# Patient Record
Sex: Female | Born: 1940 | Race: White | Hispanic: No | Smoking: Never smoker
Health system: Southern US, Community
[De-identification: ages and names within clinical notes are randomized; demographics above are authoritative.]

## PROBLEM LIST (undated history)

## (undated) DIAGNOSIS — E039 Hypothyroidism, unspecified: Secondary | ICD-10-CM

## (undated) DIAGNOSIS — Z794 Long term (current) use of insulin: Secondary | ICD-10-CM

## (undated) DIAGNOSIS — I639 Cerebral infarction, unspecified: Secondary | ICD-10-CM

## (undated) DIAGNOSIS — I10 Essential (primary) hypertension: Secondary | ICD-10-CM

## (undated) DIAGNOSIS — IMO0001 Reserved for inherently not codable concepts without codable children: Secondary | ICD-10-CM

## (undated) DIAGNOSIS — E669 Obesity, unspecified: Secondary | ICD-10-CM

## (undated) DIAGNOSIS — E785 Hyperlipidemia, unspecified: Secondary | ICD-10-CM

## (undated) DIAGNOSIS — E119 Type 2 diabetes mellitus without complications: Secondary | ICD-10-CM

## (undated) HISTORY — PX: APPENDECTOMY: SHX54

## (undated) HISTORY — PX: OTHER SURGICAL HISTORY: SHX169

## (undated) HISTORY — DX: Reserved for inherently not codable concepts without codable children: IMO0001

## (undated) HISTORY — DX: Cerebral infarction, unspecified: I63.9

## (undated) HISTORY — DX: Hypothyroidism, unspecified: E03.9

## (undated) HISTORY — DX: Type 2 diabetes mellitus without complications: E11.9

## (undated) HISTORY — PX: HAND SURGERY: SHX662

## (undated) HISTORY — DX: Hyperlipidemia, unspecified: E78.5

## (undated) HISTORY — PX: EYE SURGERY: SHX253

## (undated) HISTORY — PX: LUNG SURGERY: SHX703

## (undated) HISTORY — PX: WRIST SURGERY: SHX841

## (undated) HISTORY — DX: Long term (current) use of insulin: Z79.4

## (undated) HISTORY — PX: CHOLECYSTECTOMY: SHX55

## (undated) HISTORY — DX: Obesity, unspecified: E66.9

## (undated) HISTORY — DX: Essential (primary) hypertension: I10

---

## 2004-07-24 ENCOUNTER — Encounter: Admission: RE | Admit: 2004-07-24 | Discharge: 2004-07-24 | Payer: Self-pay | Admitting: Rheumatology

## 2004-11-30 ENCOUNTER — Inpatient Hospital Stay (HOSPITAL_COMMUNITY): Admission: RE | Admit: 2004-11-30 | Discharge: 2004-12-03 | Payer: Self-pay | Admitting: Orthopedic Surgery

## 2004-11-30 ENCOUNTER — Ambulatory Visit: Payer: Self-pay | Admitting: Physical Medicine & Rehabilitation

## 2004-12-03 ENCOUNTER — Inpatient Hospital Stay (HOSPITAL_COMMUNITY)
Admission: RE | Admit: 2004-12-03 | Discharge: 2004-12-10 | Payer: Self-pay | Admitting: Physical Medicine & Rehabilitation

## 2004-12-03 ENCOUNTER — Ambulatory Visit: Payer: Self-pay | Admitting: Physical Medicine & Rehabilitation

## 2007-11-14 ENCOUNTER — Emergency Department (HOSPITAL_COMMUNITY): Admission: EM | Admit: 2007-11-14 | Discharge: 2007-11-14 | Payer: Self-pay | Admitting: Emergency Medicine

## 2008-01-25 ENCOUNTER — Encounter: Payer: Self-pay | Admitting: Internal Medicine

## 2008-02-01 ENCOUNTER — Encounter: Payer: Self-pay | Admitting: Internal Medicine

## 2008-03-03 ENCOUNTER — Encounter: Payer: Self-pay | Admitting: Internal Medicine

## 2008-04-02 ENCOUNTER — Encounter: Payer: Self-pay | Admitting: Internal Medicine

## 2008-05-03 ENCOUNTER — Encounter: Payer: Self-pay | Admitting: Internal Medicine

## 2008-06-03 ENCOUNTER — Encounter: Payer: Self-pay | Admitting: Internal Medicine

## 2008-07-01 ENCOUNTER — Encounter: Payer: Self-pay | Admitting: Internal Medicine

## 2008-11-18 ENCOUNTER — Encounter: Payer: Self-pay | Admitting: Internal Medicine

## 2008-12-02 ENCOUNTER — Ambulatory Visit: Payer: Self-pay | Admitting: Internal Medicine

## 2008-12-17 ENCOUNTER — Ambulatory Visit: Payer: Self-pay | Admitting: Internal Medicine

## 2008-12-25 ENCOUNTER — Inpatient Hospital Stay (HOSPITAL_COMMUNITY): Admission: RE | Admit: 2008-12-25 | Discharge: 2008-12-30 | Payer: Self-pay | Admitting: Orthopedic Surgery

## 2009-06-26 ENCOUNTER — Ambulatory Visit: Payer: Self-pay | Admitting: Internal Medicine

## 2009-07-10 ENCOUNTER — Ambulatory Visit: Payer: Self-pay | Admitting: Internal Medicine

## 2009-07-24 ENCOUNTER — Ambulatory Visit: Payer: Self-pay | Admitting: Internal Medicine

## 2009-09-09 ENCOUNTER — Encounter: Admission: RE | Admit: 2009-09-09 | Discharge: 2009-09-09 | Payer: Self-pay | Admitting: Orthopedic Surgery

## 2010-03-05 ENCOUNTER — Ambulatory Visit: Payer: Self-pay | Admitting: Cardiology

## 2010-03-05 ENCOUNTER — Ambulatory Visit (HOSPITAL_COMMUNITY): Admission: RE | Admit: 2010-03-05 | Discharge: 2010-03-05 | Payer: Self-pay | Admitting: Cardiology

## 2010-03-12 ENCOUNTER — Telehealth: Payer: Self-pay | Admitting: Cardiology

## 2010-03-12 DIAGNOSIS — M79609 Pain in unspecified limb: Secondary | ICD-10-CM

## 2010-03-12 HISTORY — DX: Pain in unspecified limb: M79.609

## 2010-03-13 ENCOUNTER — Ambulatory Visit: Payer: Self-pay

## 2010-03-13 ENCOUNTER — Encounter: Payer: Self-pay | Admitting: Cardiology

## 2010-03-17 ENCOUNTER — Telehealth: Payer: Self-pay | Admitting: Cardiology

## 2010-04-01 ENCOUNTER — Encounter: Payer: Self-pay | Admitting: Cardiology

## 2010-04-01 ENCOUNTER — Ambulatory Visit: Payer: Self-pay | Admitting: Cardiology

## 2010-04-01 DIAGNOSIS — R9439 Abnormal result of other cardiovascular function study: Secondary | ICD-10-CM | POA: Insufficient documentation

## 2010-04-01 DIAGNOSIS — M069 Rheumatoid arthritis, unspecified: Secondary | ICD-10-CM | POA: Insufficient documentation

## 2010-04-01 DIAGNOSIS — I10 Essential (primary) hypertension: Secondary | ICD-10-CM

## 2010-04-01 HISTORY — DX: Essential (primary) hypertension: I10

## 2010-04-01 HISTORY — DX: Abnormal result of other cardiovascular function study: R94.39

## 2010-04-02 ENCOUNTER — Encounter: Payer: Self-pay | Admitting: Cardiology

## 2010-04-08 ENCOUNTER — Ambulatory Visit
Admission: RE | Admit: 2010-04-08 | Discharge: 2010-04-09 | Payer: Self-pay | Source: Home / Self Care | Attending: Orthopedic Surgery | Admitting: Orthopedic Surgery

## 2010-06-02 NOTE — Progress Notes (Signed)
Summary: letter for surgery  Phone Note Call from Patient Call back at Home Phone [phone]   Caller: Patient Reason for Call: Talk to Nurse Summary of Call: pt states she needs a letter for surgical clearance. Initial call taken by: Roe Coombs,  March 17, 2010 4:45 PM  Follow-up for Phone Call        Dr Riley Kill dictated clearance note and spoke with pt.  Follow-up by: Julieta Gutting, RN, BSN,  March 17, 2010 6:55 PM

## 2010-06-02 NOTE — Assessment & Plan Note (Signed)
Summary: eph/cardiac cath    Visit Type:  Post-hospital Primary Provider:  Dr. Lorin Picket   History of Present Illness: [name] is in for follow up.  We reviewed her cardiac studies today in detail.  She should be stable for surgery.  She asked me about thickening near her right face, and I suggested she look at this with her rheumatologist.  She has dry mouth and eyes  ?Sjogrens.  Her cardiac studies did not suggest that the abnormal nuc study was significant.  The LAD was mildly plaqued.  There is a modest RCA lesion, but not tight.  She needs foot surgery.    Problems Prior to Update: 1)  Rheumatoid Arthritis  (ICD-714.0) 2)  Hypertension, Benign  (ICD-401.1) 3)  Abnormal Cv (STRESS) Test  (ICD-794.39) 4)  Leg Pain, Right  (ICD-729.5)  Current Medications (verified): 1)  Metformin Hcl 1000 Mg Tabs (Metformin Hcl) .... Take 1 Tablet By Mouth Two Times A Day 2)  Naproxen 375 Mg Tabs (Naproxen) .... Take 1 Tablet By Mouth Two Times A Day 3)  Atacand 16 Mg Tabs (Candesartan Cilexetil) .... Take 1 Tablet By Mouth Once A Day 4)  Clonidine Hcl 0.2 Mg Tabs (Clonidine Hcl) .... Take Take 1/2 Tablet Dily 5)  Lansoprazole 30 Mg Tbdp (Lansoprazole) .... Take 1 Tablet By Mouth Once A Day 6)  Vytorin 10-20 Mg Tabs (Ezetimibe-Simvastatin) .... Take One Tablet By Mouth Daily At Bedtime 7)  Klor-Con 10 10 Meq Cr-Tabs (Potassium Chloride) .... 2 Tablets Daily When Taking Furosemide 8)  Furosemide 40 Mg Tabs (Furosemide) .... Take One Tablet By Mouth Daily. 9)  Levothyroxine Sodium 75 Mcg Tabs (Levothyroxine Sodium) .... Take 1 Tablet By Mouth Once A Day 10)  Metoprolol Tartrate 50 Mg Tabs (Metoprolol Tartrate) .... Take 2   Tablets Every Morning and 1 Tablet Every Evening. 11)  Metanx Tab 2mg  .... Take 1 Tablet By Mouth Once A Day 12)  Glimepiride 4 Mg Tabs (Glimepiride) .... Take 1 1/2 Tablet Daily 13)  Valium 5 Mg Tabs (Diazepam) .... Take 1 Tablet By Mouth Three Times A Day 14)  Oxycodone Hcl 5 Mg  Tabs (Oxycodone Hcl) .... Take 1 Tablet By Mouth Two Times A Day 15)  Oxycontin 40 Mg Xr12h-Tab (Oxycodone Hcl) .... One Tablet Every 8 Hours 16)  Prednisone 5 Mg Tabs (Prednisone) .... 2 Tablets Daily 17)  Humulin 70/30 Pen 70-30 % Susp (Insulin Isophane & Regular) .... 48 Units Am and 10 Units Pm  Allergies: 1)  ! Sulfa 2)  ! Adhesive Tape 3)  ! * Latex  Vital Signs:  Patient profile:   70 year old female Height:      59 inches Weight:      189 pounds BMI:     38.31 Pulse rate:   63 / minute Pulse rhythm:   irregular Resp:     18 per minute BP sitting:   110 / 70  (left arm) Cuff size:   large  Vitals Entered By: Vikki Ports (April 01, 2010 3:13 PM)  Physical Exam  General:  Well developed, well nourished, in no acute distress. Head:  normocephalic and atraumatic Eyes:  PERRLA/EOM intact; conjunctiva and lids normal. Neck:  Slightly anterior to R ear some thickening. Lungs:  Clear bilaterally to auscultation and percussion. Heart:  PMI non displaced.  Normal S1 and S2.  No definite murmur. Pulses:  pulses normal in all 4 extremities.  Groin stable. Extremities:  No clubbing or cyanosis.   EKG  Procedure date:  04/01/2010  Findings:      NSR.  First degree av block.Voltage for LVH.  No repole changes.    Cardiac Cath  Procedure date:  03/06/2010  Findings:      ANGIOGRAPHIC DATA: 1. Ventriculography done in the RAO projection reveals vigorous global     systolic function.  No segmental abnormalities or contraction     identified. 2. The left main is free of critical disease. 3. The left anterior descending artery courses to the apex.  There was     no significant calcification noted.  There was perhaps 10-20%     eccentric plaquing near the diagonal takeoff.  No other areas of     narrowing are noted, and the distal vessel was widely patent. 4. The circumflex is a fairly large-caliber vessel providing a smaller     first marginal, a very large second  marginal all of which are free     of critical disease. 5. The right coronary artery has about 50-60% narrowing in the     midportion.  It was on a steep bend.  The distal vessel consists of     a posterior descending and posterolateral branch both of which are     free of disease.   CONCLUSIONS: 1. Preserved left ventricular function. 2. No evidence of high-grade LAD disease in the area of previous     concern by radionuclide imaging. 3. 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  Impression & Recommendations:  Problem # 1:  ABNORMAL CV (STRESS) TEST (ICD-794.39) Ischemia not correlative at cath.  LAD without significant narrowing.  Modest RCA.  Suggest careful risk factor reduction.  Continued followup one year.   Problem # 2:  HYPERTENSION, BENIGN (ICD-401.1) controlled at present. Her updated medication list for this problem includes:    Atacand 16 Mg Tabs (Candesartan cilexetil) .Marland Kitchen... Take 1 tablet by mouth once a day    Clonidine Hcl 0.2 Mg Tabs (Clonidine hcl) .Marland Kitchen... Take take 1/2 tablet dily    Furosemide 40 Mg Tabs (Furosemide) .Marland Kitchen... Take one tablet by mouth daily.    Metoprolol Tartrate 50 Mg Tabs (Metoprolol tartrate) .Marland Kitchen... Take 2   tablets every morning and 1 tablet every evening.  Problem # 3:  LEG PAIN, RIGHT (ICD-729.5) Groin looks fine.   Patient Instructions: 1)  Your physician recommends that you schedule a follow-up appointment as needed.  2)  Your physician recommends that you continue on your current medications as directed. Please refer to the Current Medication list given to you today.

## 2010-06-02 NOTE — Progress Notes (Signed)
Summary: pt request call  Phone Note Call from Patient Call back at Home Phone [phone]   Caller: Patient Summary of Call: pt request call Initial call taken by: Judie Grieve,  March 12, 2010 2:11 PM  Follow-up for Phone Call        pt. had question regarding her Venous Duplex tomorrow. She is aware to come tomorrow morning for the ultrasound and to stay off of her leg as much as possible for the remainder of the day. No  other further problems. Whitney Maeola Sarah RN  March 12, 2010 2:46 PM  Follow-up by: Whitney Maeola Sarah RN,  March 12, 2010 2:46 PM

## 2010-06-02 NOTE — Progress Notes (Signed)
Summary: question on meds  Phone Note Call from Patient Call back at Home Phone [phone]   Caller: Patient Reason for Call: Talk to Nurse Summary of Call: pt has question on medication that dr. Riley Kill was going to give her. pt was at [facility] dr. Riley Kill did her cardiac cath. Initial call taken by: Roe Coombs,  March 12, 2010 12:30 PM  Follow-up for Phone Call        Pt does not have any questions about medicines. She does state that she had a lot of brusing after cath. Today she noticed a small knot. Groin has been painful since cath. Will discuss with DOD. Pt does not have transportation to office today to have any studies done. Dossie Arbour, RN, BSN  March 12, 2010 1:01 PM   Additional Follow-up for Phone Call Additional follow up Details #1::        Schedule for ultrasound.  OK to wait until 11/11 when the patient has transportation. Additional Follow-up by: Rollene Rotunda, MD, [facility],  March 12, 2010 1:18 PM    Additional Follow-up for Phone Call Additional follow up Details #2::    Pt. notified. She will come in for ultasound tomorrow at 9:30   Appended Document: Orders Update    Clinical Lists Changes  Problems: Added new problem of LEG PAIN, RIGHT (ICD-729.5) Orders: Added new Test order of Arterial Duplex Lower Extremity (Arterial Duplex Low) - Signed

## 2010-07-13 LAB — GLUCOSE, CAPILLARY
Glucose-Capillary: 210 mg/dL — ABNORMAL HIGH (ref 70–99)
Glucose-Capillary: 251 mg/dL — ABNORMAL HIGH (ref 70–99)
Glucose-Capillary: 277 mg/dL — ABNORMAL HIGH (ref 70–99)

## 2010-07-13 LAB — POCT I-STAT, CHEM 8
Chloride: 106 mEq/L (ref 96–112)
Glucose, Bld: 220 mg/dL — ABNORMAL HIGH (ref 70–99)
Potassium: 3.9 mEq/L (ref 3.5–5.1)
Sodium: 142 mEq/L (ref 135–145)
TCO2: 28 mmol/L (ref 0–100)

## 2010-07-14 LAB — GLUCOSE, CAPILLARY: Glucose-Capillary: 109 mg/dL — ABNORMAL HIGH (ref 70–99)

## 2010-07-14 LAB — BASIC METABOLIC PANEL
BUN: 20 mg/dL (ref 6–23)
CO2: 30 mEq/L (ref 19–32)
Calcium: 9.2 mg/dL (ref 8.4–10.5)
Chloride: 107 mEq/L (ref 96–112)
Creatinine, Ser: 0.76 mg/dL (ref 0.4–1.2)
Glucose, Bld: 109 mg/dL — ABNORMAL HIGH (ref 70–99)
Potassium: 3.8 mEq/L (ref 3.5–5.1)
Sodium: 143 mEq/L (ref 135–145)

## 2010-07-14 LAB — CBC
HCT: 35.2 % — ABNORMAL LOW (ref 36.0–46.0)
Hemoglobin: 11.5 g/dL — ABNORMAL LOW (ref 12.0–15.0)
MCHC: 32.7 g/dL (ref 30.0–36.0)
Platelets: 195 10*3/uL (ref 150–400)
RBC: 4.07 MIL/uL (ref 3.87–5.11)
WBC: 11.8 10*3/uL — ABNORMAL HIGH (ref 4.0–10.5)

## 2010-07-14 LAB — PROTIME-INR: INR: 0.94 (ref 0.00–1.49)

## 2010-08-08 LAB — CBC
HCT: 30.1 % — ABNORMAL LOW (ref 36.0–46.0)
HCT: 34.4 % — ABNORMAL LOW (ref 36.0–46.0)
Hemoglobin: 10.1 g/dL — ABNORMAL LOW (ref 12.0–15.0)
Hemoglobin: 8.9 g/dL — ABNORMAL LOW (ref 12.0–15.0)
MCHC: 33.4 g/dL (ref 30.0–36.0)
MCHC: 33.4 g/dL (ref 30.0–36.0)
MCHC: 33.5 g/dL (ref 30.0–36.0)
MCV: 92.8 fL (ref 78.0–100.0)
MCV: 93.6 fL (ref 78.0–100.0)
MCV: 94.4 fL (ref 78.0–100.0)
Platelets: 175 10*3/uL (ref 150–400)
Platelets: 200 10*3/uL (ref 150–400)
RBC: 2.84 MIL/uL — ABNORMAL LOW (ref 3.87–5.11)
RBC: 3.24 MIL/uL — ABNORMAL LOW (ref 3.87–5.11)
RBC: 3.64 MIL/uL — ABNORMAL LOW (ref 3.87–5.11)
RDW: 12.8 % (ref 11.5–15.5)
RDW: 12.9 % (ref 11.5–15.5)
WBC: 10.3 10*3/uL (ref 4.0–10.5)
WBC: 10.7 10*3/uL — ABNORMAL HIGH (ref 4.0–10.5)

## 2010-08-08 LAB — BASIC METABOLIC PANEL
BUN: 18 mg/dL (ref 6–23)
CO2: 25 mEq/L (ref 19–32)
CO2: 29 mEq/L (ref 19–32)
Calcium: 9.1 mg/dL (ref 8.4–10.5)
Chloride: 101 mEq/L (ref 96–112)
Chloride: 103 mEq/L (ref 96–112)
Creatinine, Ser: 0.73 mg/dL (ref 0.4–1.2)
GFR calc Af Amer: >60 mL/min (ref >60)
GFR calc Af Amer: >60 mL/min (ref >60)
GFR calc non Af Amer: >60 mL/min (ref >60)
Glucose, Bld: 269 mg/dL — ABNORMAL HIGH (ref 70–99)
Glucose, Bld: 271 mg/dL — ABNORMAL HIGH (ref 70–99)
Potassium: 4.2 mEq/L (ref 3.5–5.1)
Sodium: 138 mEq/L (ref 135–145)

## 2010-08-08 LAB — GLUCOSE, CAPILLARY
Glucose-Capillary: 128 mg/dL — ABNORMAL HIGH (ref 70–99)
Glucose-Capillary: 189 mg/dL — ABNORMAL HIGH (ref 70–99)
Glucose-Capillary: 206 mg/dL — ABNORMAL HIGH (ref 70–99)
Glucose-Capillary: 218 mg/dL — ABNORMAL HIGH (ref 70–99)
Glucose-Capillary: 231 mg/dL — ABNORMAL HIGH (ref 70–99)
Glucose-Capillary: 270 mg/dL — ABNORMAL HIGH (ref 70–99)
Glucose-Capillary: 326 mg/dL — ABNORMAL HIGH (ref 70–99)
Glucose-Capillary: 341 mg/dL — ABNORMAL HIGH (ref 70–99)

## 2010-08-08 LAB — PROTIME-INR
INR: 1 (ref 0.00–1.49)
INR: 1.8 — ABNORMAL HIGH (ref 0.00–1.49)
INR: 2 — ABNORMAL HIGH (ref 0.00–1.49)
INR: 2.2 — ABNORMAL HIGH (ref 0.00–1.49)
Prothrombin Time: 12.9 seconds (ref 11.6–15.2)
Prothrombin Time: 14.3 seconds (ref 11.6–15.2)
Prothrombin Time: 20.4 seconds — ABNORMAL HIGH (ref 11.6–15.2)
Prothrombin Time: 20.8 seconds — ABNORMAL HIGH (ref 11.6–15.2)
Prothrombin Time: 22.9 seconds — ABNORMAL HIGH (ref 11.6–15.2)
Prothrombin Time: 23.9 seconds — ABNORMAL HIGH (ref 11.6–15.2)

## 2010-08-08 LAB — URINALYSIS, ROUTINE W REFLEX MICROSCOPIC
Bilirubin Urine: NEGATIVE
Glucose, UA: 250 mg/dL — AB
Hgb urine dipstick: NEGATIVE
Specific Gravity, Urine: 1.015 (ref 1.005–1.030)

## 2010-08-08 LAB — TYPE AND SCREEN: Antibody Screen: NEGATIVE

## 2010-08-08 LAB — COMPREHENSIVE METABOLIC PANEL
ALT: 24 U/L (ref 0–35)
Albumin: 3.3 g/dL — ABNORMAL LOW (ref 3.5–5.2)
Alkaline Phosphatase: 88 U/L (ref 39–117)
CO2: 28 mEq/L (ref 19–32)
GFR calc Af Amer: >60 mL/min (ref >60)
Sodium: 138 mEq/L (ref 135–145)
Total Bilirubin: 0.4 mg/dL (ref 0.3–1.2)

## 2010-09-15 NOTE — H&P (Signed)
NAMERAE, PLOTNER             ACCOUNT NO.:  [id number]   MEDICAL RECORD NO.:  [id number]          PATIENT TYPE:  INP   LOCATION:  NA                           FACILITY:  [facility]   PHYSICIAN:  Ollen Gross, M.D.    DATE OF BIRTH:  06-05-1940   DATE OF ADMISSION:  12/25/2008  DATE OF DISCHARGE:                              HISTORY & PHYSICAL   CHIEF COMPLAINT:  Rheumatoid arthritis with end-stage arthritis in her  right knee.   HISTORY OF PRESENT ILLNESS:  Ms. Ivins is a 70 year old female who has  been dealing with rheumatoid arthritis for quite some time.  She had her  left knee replaced about 4 years ago and states that her right knee has  continued to worsen.  Her pain is so severe that she is unable to walk.  At times, she is unable to do things she would like to do at this point.  The patient would like to go ahead and get the knee replaced if  possible.  X-rays of the right knee reveal end-stage arthritis,  tricompartmental in nature in the right knee.   ALLERGIES:  SULFA (CAUSES NAUSEA, VOMITING), TAPE AND LATEX.   CURRENT MEDICATIONS:  1. Naproxen 375 mg 1 tablet p.o. b.i.d.  2. Actos/Metformin 15/500 mg 1 tablet p.o. daily.  3. Atacand 16 mg 1 tablet p.o. daily.  4. Fexofenadine 180 mg 1 tablet daily.  5. Clonidine 0.2 mg 1/2 tablet p.o. daily.  6. Prevacid 30 mg 1 tablet daily.  7. Vytorin 10/20 mg 1 tablet p.o. in the evening.  8. Hydrochlorot 25 mg 1 tablet p.o. daily  9. Metoprolol 50 mg 2 tablets p.o. in the morning and 1 tablet p.o. in      the evening.  10.Valium 5 mg 1 tablet p.o. t.i.d.  11.Oxycodone 5/325 mg 1 tablet p.o. b.i.d.  12.OxyContin 40 mg 1 tablet p.o. q.8 h.  13.Metanx 2 tablets p.o. daily.  14.Prednisone 5 mg 2 tablets p.o. daily.  15.Humalog mixture 75/25 quick pen insulin per sliding scale.   CURRENT MEDICAL CONDITIONS:  1. Rheumatoid arthritis.  2. End-stage arthritis of the right knee.  3. Hypertension.  4. Hiatal hernia.  5.  Gastroesophageal reflux disease.  6. Hemorrhoids.  7. Phlebitis  8. Urinary incontinence.  9. Urinary tract infections.  10.Cystitis.  11.Diabetes controlled by insulin.  12.Cellulitis.   REVIEW OF SYSTEMS:  GENERAL:  Positive for fatigue.  Negative for night  sweats or weight change.  HEENT/NEUROLOGICAL:  Positive for balance  problems.  Positive for dentures on top and bottom.  Negative for  blurred vision.  RESPIRATORY:  Negative for shortness of breath at rest  or with exertion.  Negative for cough.  Negative for wheezing.  CARDIOVASCULAR:  Negative for chest pain, palpitations.  GASTROINTESTINAL:  Negative for nausea, vomiting, diarrhea, negative for  blood in the stool.  GENITOURINARY:  Positive for urinary frequency.  Positive for frequent urinary tract infections and positive for urinary  incontinence.  DERM:  Positive for small recurring diabetic ulcer on the  plantar surface of the patient's right great toe.  MUSCULOSKELETAL:  Joint pain, joint swelling and back pain.   PAST SURGICAL HISTORY:  1. Left total knee arthroplasty.  2. Fusion of both great toes and thumbs.  3. Appendectomy.  4. Cholecystectomy.  5. 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   FAMILY HISTORY:  Father passed at age 85 of a heart attack.  Mother  passed at age 55 of Hodgkin's disease.  She also has hypertension.   SOCIAL HISTORY:  The patient is married.  She has never smoked.  The  patient denies use of alcohol or drugs.  The patient lives at home with  her husband and plans to go home after the surgery rather than going to  a rehabilitation center.   PHYSICAL EXAMINATION:  VITAL SIGNS:  Pulse 60, respirations 16, blood  pressure 107/75.  GENERAL:  Alert and oriented x79, a 70 year old white female well  developed, well nourished no acute distress.  Good historian.  HEENT:  Unremarkable.  NECK:  Supple, full range of motion.  Negative for lymphadenopathy.  CHEST:  Lungs  are clear to auscultation bilaterally without wheezes.  HEART:  Regular rate and rhythm without murmur.  ABDOMEN:  Soft, nontender, nondistended.  No guarding with palpation.  Bowel sounds are present in all four quadrants.  EXTREMITIES:  Right  knee negative for effusion, marked crepitus is palpated on range of  motion.  Her range of between 10 and 95 degrees.  There is no  instability noted, right great toe plantar surfaces has diabetic ulcer.  The patient states that this has recently been closed for a week, but  she feels that it may start to open.  Note, the patient has been going  to the wound center in [place].  NEUROLOGIC:  The patient has  decreased sensation in her feet bilaterally.  PERIPHERAL VASCULAR:  Carotid pulses 2+ without bruit.  Dorsalis pedis  pulses 2+ bilaterally.  Radial pulses 2+ bilaterally.   IMPRESSION:  Rheumatoid arthritis with end-stage arthritis of the right  knee.   PLAN:  Right total knee arthroplasty to be performed by Dr. Lequita Halt at  [facility] on December 25, 2008.  I have discussed with the  patient, that I would like for her to please send [place] a note or some type  of correspondence from her wound care physician in [place] regarding  their recommendations or thoughts for diabetic ulcer.      Rozell Searing, [facility]      Ollen Gross, M.D.  Electronically Signed    LD/MEDQ  D:  12/25/2008  T:  12/25/2008  Job:  [id number]

## 2010-09-15 NOTE — Op Note (Signed)
[name], [name]             ACCOUNT NO.:  [id number]   MEDICAL RECORD NO.:  [id number]          PATIENT TYPE:  INP   LOCATION:  0006                         FACILITY:  [facility]   PHYSICIAN:  Ollen Gross, M.D.    DATE OF BIRTH:  12/21/1940   DATE OF PROCEDURE:  12/25/2008  DATE OF DISCHARGE:                               OPERATIVE REPORT   PREOPERATIVE DIAGNOSIS:  Rheumatoid arthritis, right knee.   POSTOPERATIVE DIAGNOSIS:  Rheumatoid arthritis, right knee.   PROCEDURE:  Right total knee arthroplasty.   SURGEON:  Aluisio.   ASSISTANT:  Avel Peace PA-C.   ANESTHESIA:  Spinal.   ESTIMATED BLOOD LOSS:  Minimal.   DRAINS:  None.   TOURNIQUET TIME:  25 minutes at 300 mmHg.   COMPLICATIONS:  None.   CONDITION:  Stable to the recovery.   CLINICAL NOTE:  [name] is a 70 year old female with severe  rheumatoid arthritis and a terribly degenerative right knee.  She has  severe tricompartmental degeneration.  She has failed nonoperative  management and presents now for right total knee arthroplasty.   PROCEDURE IN DETAIL:  After successful administration of spinal  anesthetic, a tourniquet is placed on her right thigh, and the right  lower extremity is prepped and draped in the usual sterile fashion.  Extremity was wrapped in Esmarch, the knee flexed, tourniquet inflated  300 mmHg.  A midline incision is made with a 10 blade through the  subcutaneous tissue to the level of the extensor mechanism.  A fresh  blade is used to make a medial parapatellar arthrotomy.  Soft tissue on  the proximal medial tibia is subperiosteally elevated to the joint line  with the knife and into the semimembranosus bursa with a Cobb elevator.  Soft tissue laterally is elevated with attention being paid to avoid the  patellar tendon on the tibial tubercle.  The patella is subluxed  laterally, knee flexed to 90 degrees and ACL and PCL removed.  A drill  was used to create a starting hole in  the distal femur, and canal was  thoroughly irrigated.  The 5-degree right valgus alignment guide is  placed and referencing off the posterior condyles, rotation is marked  and a block pinned to remove 11 mm off the distal femur.  I took 11  because of preop flexion contracture.  Distal femoral resection is made  with an oscillating saw.  Sizing blocks placed, size 3 is most  appropriate.  Rotation is marked at the epicondylar axis.  The size 3  cutting block is placed, and anterior, posterior, and chamfer cuts made.   The tibia is subluxed forward and menisci removed.  The extramedullary  tibial alignment guide is placed, referencing proximally at the medial  aspect of the tibial tubercle and distally along the second metatarsal  axis and tibial crest.  The block was pinned to remove 2 mm off the very  deficient medial side.  Tibial resection is made with an oscillating  saw.  A size 3 is the most appropriate tibial component, and the  proximal tibia is prepared to modular drill and  keel punch for the size  3.  Femoral preparation is completed with the intercondylar cut for the  size 3.   A size 3 mobile bearing tibial trial, size 3 posterior stabilized  femoral trial, and a 12.5-mm posterior stabilized rotating platform  insert trial are placed.  With the 12.5, full extension is achieved with  excellent varus-valgus and anterior-posterior balance throughout full  range of motion.  Patella was everted, thickness measured to be 22 mm.  Freehand resection is taken at 13 mm, a 35 template is placed, lug holes  were drilled.  A trial patella was placed and it tracks normally.  Osteophytes were removed off the posterior femur with the trial in  place.  All trials were removed, and the cut-bone surfaces are prepared  with pulsatile lavage.  Cement was mixed and once ready for  implantation, the size 3 mobile bearing tibial tray, size 3 posterior  stabilized femur, and 35 patella are cemented  into place.  The patella  was held with a clamp.  A trial 12.5 insert is placed, and knee held in  full extension.  All extruded cement removed.  When the cement is fully  hardened, then the permanent 12.5 mm posterior stabilized rotating  platform insert is placed in the tibial tray.  The wound was copiously  irrigated with saline solution, and then the FloSeal is injected on the  mediolateral gutters and suprapatellar area.  A moist sponge is placed  and tourniquet released for a total time of 25 minutes.  Sponges held  for 2 minutes and removed.  Minimal bleeding is encountered.  The  bleeding that is encountered is stopped with electrocautery.  The wound  is again thoroughly irrigated, and the arthrotomy closed with  interrupted #1 PDS.  Flexion against gravity is 130 degrees.  Subcu was  closed with interrupted 2-0 Vicryl and subcuticular running 4-0  Monocryl.  The incision is cleaned and dried, and Steri-Strips and a  bulky sterile dressing are applied.  She is then placed into a knee  immobilizer, awakened, and transferred to recovery in stable condition.      Ollen Gross, M.D.  Electronically Signed     FA/MEDQ  D:  12/25/2008  T:  12/25/2008  Job:  [id number]

## 2010-09-15 NOTE — H&P (Signed)
[name], [name]             ACCOUNT NO.:  [id number]   MEDICAL RECORD NO.:  [id number]          PATIENT TYPE:  INP   LOCATION:  NA                           [facility]ACILITY[facility]   PHYSICIAN:  Ollen Gross, M.D.    DATE OF BIRTH:  1940-12-13   DATE OF ADMISSION:  DATE OF DISCHARGE:                              HISTORY & PHYSICAL   CHIEF COMPLAINTS:  End-stage arthritis of the right knee.   HISTORY OF PRESENT ILLNESS:  [name] is a 70 year old female who has  been followed by Dr. Lequita Halt for worsening arthritis in her right knee.  The patient states that the pain has been worsening over the last few  years and at this point she is ready to have the total knee replacement.  The patient has had total knee arthroplasty in her left knee about 4  years ago.  The patient states that now her right knee is hurting all  the time, it is preventing her from walking.  The patient's x-rays  reveal end-stage arthritis that is tricompartmental in nature in her  right knee.   ALLERGIES:  SULFA DRUGS.  These cause nausea and vomiting.  The patient  states that she also has an allergy to TAPE AND LATEX.   CURRENT MEDICATIONS:  1. Naproxen 375 mg 1 tablet p.o. b.i.d.  2. Actos/Met 15/500 mg 1 tablet p.o. daily.  3. Atacand 16 mg 1 tablet p.o. daily.  4. Fexofenadine 180 mg 1 tablet p.o. daily.  5. Clonidine 0.2 mg 1/2 tablet p.o. daily.  6. Prevacid 30 mg 1 tablet p.o. daily.  7. Vytorin 10/20 mg 1 tablet p.o. in the evening.  8. __________ 25 mg 1 tablet p.o. daily.  9. Metoprolol 50 mg 2 tablets p.o. in the morning and 1 tablet p.o. in      the evening.  10.Valium 5 mg 1 tablet p.o. t.i.d.  11.Oxycodone 5 mg 1 tablet p.o. b.i.d.  12.OxyContin 40 mg 1 tablet p.o. q.8 hours.  13.Metanx 2 tablets p.o. daily.  14.Prednisone 5 mg 2 tablets p.o. daily.  15.Humalog mixture 75/25 __________ insulin given per sliding scale.   PAST MEDICAL HISTORY:  1. Osteoarthritis right knee.  2.  Hypertension.  3. Hiatal hernia.  4. Gastroesophageal reflux disease.  5. Hemorrhoids.  6. Phlebitis  7. Urinary incontinence.  8. Urinary tract infections.  Note the last one was 2 months ago.  9. Cystitis.  10.Diabetes that is controlled by insulin.  11.Staph infection.  12.Cellulitis.  13.Rheumatoid arthritis.   PAST SURGICAL HISTORY.:  1. Left total knee arthroplasty.  2. Surgery on her right wrist.  3. Bone fusion of both of her great toes.  4. Appendectomy.  5. Cholecystectomy.  6. 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   FAMILY MEDICAL HISTORY:  Father passed away at age of 22 of heart  attack.  Mother passed away at age 1, she had Hodgkin's disease as well  as hypertension.   SOCIAL HISTORY:  The patient is married.  The patient denies smoking,  use of alcohol or drugs.  The patient lives with her husband  states that  she does have care lined up after surgery and is planning to go home  rather than to rehabilitation facility after surgery.   REVIEW OF SYSTEMS:  GENERAL:  Positive for fatigue.  Negative for weight  change or night sweats.  HEENT: NEURO:  Positive for balance problems.  Positive for dentures both top and bottom.  RESPIRATORY:  Negative for  shortness of breath.  Negative for cough or wheezes.  CARDIOVASCULAR:  Negative for chest pain.  Negative for palpitations.  GASTROINTESTINAL:  Negative for nausea, vomiting, diarrhea, negative for blood in the  stool.  GENITOURINARY: Positive for urinary frequency, positive for  incontinence, positive for frequent urinary tract infections.  MUSCULOSKELETAL:  Positive for joint pain, joint swelling, positive for  back pain.   PHYSICAL EXAMINATION:  VITAL SIGNS:  Pulse 60, respirations 16, blood  pressure 107/75.  GENERAL:  A 70 year old white female well-nourished, well-developed in  no acute distress.  The patient is alert and oriented x3 and she is a  good historian.  HEENT:  Unremarkable.  NECK:   Supple, full range of motion without lymphadenopathy.  CHEST:  Lungs are clear to auscultation bilaterally without wheezes,  rhonchi or rales.  HEART:  Regular rate and rhythm without murmur.  ABDOMEN:  Soft, nontender, nondistended.  Bowel sounds present in all  four quadrants.  EXTREMITIES:  Right knee negative for effusion.  There is marked  crepitus on range of motion.  Range of motion is from 10-95 degrees and  no instability is noted.  The patient's great toe on the plantar surface  there seems to be a small diabetic ulcer recently closed for about a  week.  The patient feels that it may be starting to open again.  The  patient has been going to the wound care center in [place] and is  being treated.  The patient's next appointment at the wound care center  she is to have a note   DICTATION ENDS HERE      [name], [facility]      Ollen Gross, M.D.  Electronically Signed    LD/MEDQ  D:  12/24/2008  T:  12/24/2008  Job:  [id number]

## 2010-09-18 NOTE — Discharge Summary (Signed)
NAMEAERYN, MEDICI             ACCOUNT NO.:  [id number]   MEDICAL RECORD NO.:  [id number]          PATIENT TYPE:  INP   LOCATION:  1509                         FACILITY:  [facility]   PHYSICIAN:  Ollen Gross, M.D.    DATE OF BIRTH:  Jun 22, 1940   DATE OF ADMISSION:  11/30/2004  DATE OF DISCHARGE:  12/03/2004                                 DISCHARGE SUMMARY   ADMISSION DIAGNOSES:  1.  Rheumatoid arthritis, left knee.  2.  Anxiety.  3.  History of pneumothorax x2.  4.  Pulmonary rheumatoid nodule.  5.  Hypertension.  6.  Hiatal hernia.  7.  Reflux disease.  8.  Hemorrhoids.  9.  Urinary incontinence.  10. Non-insulin-dependent diabetes mellitus.  11. History of leg deep venous thrombosis.  12. Rheumatoid arthritis.  13. Cervical degenerative disk disease.  14. Back degenerative disease.  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  16. History of fibrocystic disease.   DISCHARGE DIAGNOSES:  1.  Rheumatoid arthritis, left knee, status post left total knee      arthroplasty.  2.  Anxiety.  3.  History of pneumothorax x2.  4.  Pulmonary rheumatoid nodule.  5.  Hypertension.  6.  Hiatal hernia.  7.  Reflux disease.  8.  Hemorrhoids.  9.  Urinary incontinence.  10. Non-insulin-dependent diabetes mellitus.  11. History of leg deep venous thrombosis.  12. Rheumatoid arthritis.  13. Cervical degenerative disk disease.  14. Back degenerative disease.  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  16. History of fibrocystic disease.  17. Postoperative hyponatremia, improved.  18. Postoperative hypokalemia, improved.   PROCEDURE:  November 30, 2004 left total knee, surgeon Ollen Gross, M.D.,  assistant Avel Peace, P.A.-C.  Tourniquet time 47 minutes at 300 mmHg.   BRIEF HISTORY:  [name] is a 70 year old female with severe rheumatoid  arthritis, erosive changes in both knees, left more symptomatic than the  right who failed nonoperative management and now presents for a total  knee.   CONSULTATIONS:  Rehab services.   LABORATORY DATA:  Preop CBC, hemoglobin 12.5, hematocrit 35.8, normal white  count, normal differential. Postop hemoglobin 10. Last noted H&H 9.2 and  26.3. PT and PTT preop 13.2 and 30 respectively. INR 1.0. Serial pro times  followed. Last noted PT/INR 18.8 and 1.6. Chem panel on admission, elevated  glucose of 162, low albumin of 3.4, elevated AST of 59, elevated ALT of 49.  The remaining chem panel within normal limits. Serial BMETs are followed.  Sodium dropped from 135 to 131 back up to 137. Potassium dropped from 4.0 to  3.1 back up to 4.7. Preop UA positive glucose, trace leukocyte esterase, 0-2  white, otherwise negative. Blood group type O negative.   Cervical films November 26, 2004, no evidence of cervical spine subluxation on  flexion extension views. Multilevel degenerative disease, mild facet  arthropathy without significant change since prior study of July 24, 2004.  A 2 view chest, probable chronic pleural change in right base, no active  disease is felt to be present, dated November 26, 2004. Old EKG dated February 24, 2005, normal  sinus rhythm, minimal ST and T flattening confirmed, unable  to read signature.   HOSPITAL COURSE:  Admitted to [facility], tolerated procedure  well, later transferred to the recovery room and then the orthopedic floor,  started on PCA and p.o. analgesics. On day one was doing fairly well. A  rehab consult was ordered postoperatively. She would need and inpatient  stay, had a little bit of low grade temperature through the night of  surgery. Encouraged incentive spirometer and was noted to have a drop in the  sodium and potassium, fluids were reduced KVO and placed on potassium  supplements, some Metformin. Since her renal function was doing well, it was  started back. She was on sliding scale insulin postop. By day 2, she started  getting up with physical therapy, was weaning off the PCA over  to p.o. meds,  still had some elevated temperature on the night of day 1 up to 101, it was  back down to afebrile. The morning of day 2. Dressing was changed, incision  was healing well, started getting up with physical therapy and was up about  40 and 50 feet on day 2. By day 3, the patient was progressing well. It was  noted a bed became available over on the rehab SACU  unit. She was  progressing well with therapy, incision was healing well and she was  transferred at that time.   PLAN:  1.  The patient transferred over to [facility] rehab.  2.  Discharge diagnoses, please see above.  3.  Discharge medications:  Continue current medications as per the [facility],      will be sent over with the patient.  4.  Continue current diet.  5.  Activity, she is weightbearing as tolerated to the left lower extremity.      Continue gaiting training ambulation as per PT and OT while on rehab      services. Total knee protocol, may start showering, daily dressing      change. Weightbearing as tolerated.  6.  Followup 2 weeks from surgery or following discharge from the [facility]      rehab unit.   DISPOSITION:  [name].   CONDITION ON DISCHARGE:  Improved.      [name], P.A.      Ollen Gross, M.D.  Electronically Signed    ALP/MEDQ  D:  01/20/2005  T:  01/21/2005  Job:  [id number]   cc:   Rehab Services   Lucila Maine, MD   Dr. __________, Marcy Panning, [place]

## 2010-09-18 NOTE — Discharge Summary (Signed)
[name], [name]             ACCOUNT NO.:  [id number]   MEDICAL RECORD NO.:  [id number]          PATIENT TYPE:  IPS   LOCATION:  4036                         FACILITY:  [facility]   PHYSICIAN:  Ranelle Oyster, M.D.DATE OF BIRTH:  01-22-1941   DATE OF ADMISSION:  12/03/2004  DATE OF DISCHARGE:  12/10/2004                                 DISCHARGE SUMMARY   DISCHARGE DIAGNOSES:  1.  Left total knee arthroplasty secondary to rheumatoid arthritis November 30, 2004, pain management.  2.  Coumadin for deep vein thrombosis prophylaxis.  3.  Postoperative anemia.  4.  Klebsiella urinary tract infection.  5.  Rheumatoid arthritis.  6.  Hypertension.  7.  Non-insulin dependent diabetes mellitus.   HPI:  This is a 70 year old white female with severe rheumatoid arthritis  and chronic pain syndrome who was admitted November 30, 2004, with bilateral  knee pain left greater than right.  No relief with conservative care.  Underwent a left total knee arthroplasty November 30, 2004, per Dr. Lequita Halt.  Placed on Coumadin for deep vein thrombosis prophylaxis, weight bearing as  tolerated.  Postoperative anemia 9.2 and monitored.  The patient was  admitted for a comprehensive rehab program.   PAST MEDICAL HISTORY:  See discharge diagnoses.  No alcohol or tobacco.   ALLERGIES:  1.  SULFA.  2.  DEMEROL.  3.  PLAQUENIL.   SOCIAL HISTORY:  Lives with husband in [place].  Husband and local family  work.  One level home, two steps to entry.   MEDICATIONS PRIOR TO ADMISSION WERE:  1.  Acto-Plus-MET 15/500 tablet daily.  2.  Amaryl 4 mg twice daily.  3.  Atacand 16 mg daily.  4.  Clonidine 0.2 mg 1/2 tablet at bedtime.  5.  Hydrochlorothiazide 25 mg daily.  6.  Metoprolol 50 mg two tablets a.m., one tablet p.m.  7.  Prevacid 30 mg daily.  8.  Valium 5 mg three times daily.  9.  OxyContin CR 20 mg two tabs in the a.m., one tablet at 3 p.m., one      tablet at bedtime.   HOSPITAL COURSE:  Patient  with progressive gains while in rehab services  with therapies initiated on a b.i.d. basis.  The following issues are  followed during patient's rehab course.  Pertaining to [name]'s left  total knee arthroplasty secondary to rheumatoid arthritis, surgical site  healing nicely, no signs of infection, she was ambulating extended  distances, weightbearing as tolerated.  Pain control with noted history  of  chronic pain syndrome, followed at the [facility] in  [facility].  Currently on 40 mg of OxyContin sustained release every 8 hours, which would  be slowly tapered back to her home regimen, prednisone taper to be  completed, oxycodone for breakthrough pain.  Patient remained on Coumadin  for deep vein thrombosis prophylaxis with latest INR of 2.0 with Gentiva to  complete Coumadin protocol.  Postoperative anemia stable, maintained on iron  supplement with latest hemoglobin of 9.4.  Patient with long history of  rheumatoid arthritis.  She was  back now on her Enbrel and monitored.  Blood  pressures controlled with hydrochlorothiazide, Lopressor and clonidine.  Her  blood sugars remained somewhat variable while on prednisone taper 220, 118.  She continued on her Amaryl as well as Actos and Glucophage.  Overall for  her functional status, she was ambulating household functional distances  with a walker, CPM machine at 60 degrees, needing minimal assist for lower  body dressing, home health therapies would be arranged.   Latest labs showed an INR of 2.0, hemoglobin 9.4, hematocrit 27.6, sodium  138, potassium 3.9, BUN 17, creatinine 0.8.  She did complete a course of  Cipro 7 day course completed December 10, 2004, for a Klebsiella urinary tract  infection.  She denied any dysuria or hematuria.   DISCHARGE MEDICATIONS INCLUDED:  1.  Coumadin with latest dose of 5 mg to be completed on December 31, 2004.  2.  Valium 5 mg three times daily.  3.  Clonidine 0.1 mg at bedtime.  4.   Amaryl 4 mg daily.  5.  Hydrochlorothiazide 25 mg daily.  6.  Atacand 16 mg daily.  7.  Acto-Plus-MET 15/500 daily.  8.  Prevacid 30 mg daily.  9.  Lopressor 50 mg two tablet in the a.m., one tablet in the p.m.  10. OxyContin sustained release 40 mg every 8 hours, taper accordingly.  11. Oxycodone Immediate Release for breakthrough pain.  12. Trinsicon twice daily.  13. Prednisone taper 5 mg two tablets daily x2 days, then one tablet daily      x2 days then stop.  14. Enbrel 25 mg Monday and Friday.   DIET:  Diabetic diet.   Patient should followup with Dr. Lorin Picket, Medical Management in [place]; Dr.  Lequita Halt, [phone], Orthopedic Services, and at [facility] as well  as Dr. Emeterio Reeve, [facility], for her rheumatology, rheumatoid arthritis.  Home health therapies, physical and occupational therapy, home health nurse  per Genevieve Norlander, to complete Coumadin protocol.       DA/MEDQ  D:  12/09/2004  T:  12/09/2004  Job:  [id number]   cc:   Ranelle Oyster, M.D.  [address] [place]  [place] [address]  Fax: [phone]   Ollen Gross, M.D.  [facility]  [address]  [address]  [place]  [place] [address]  Fax: [phone]   Lorin Picket, Dr.  Rosalita Levan, [facility]   [facility]  [address]  [place], [place] [address]

## 2010-09-18 NOTE — Op Note (Signed)
[name], [name]             ACCOUNT NO.:  [id number]   MEDICAL RECORD NO.:  [id number]          PATIENT TYPE:  INP   LOCATION:  X003                         [facility]ACILITY:  [facility]   PHYSICIAN:  Ollen Gross, M.D.    DATE OF BIRTH:  18-May-1940   DATE OF PROCEDURE:  11/30/2004  DATE OF DISCHARGE:                                 OPERATIVE REPORT   PREOPERATIVE DIAGNOSIS:  Rheumatoid arthritis, left knee.   POSTOPERATIVE DIAGNOSIS:  Rheumatoid arthritis, left knee.   PROCEDURE:  Left total knee arthroplasty.   SURGEON:  Dr. Lequita Halt.   ASSIST:  Avel Peace, PA-C   ANESTHESIA:  Spinal.   ESTIMATED BLOOD LOSS:  Minimal.   DRAINS:  Hemovac x 1.   TOURNIQUET TIME:  47 minutes at 300 mmHg.   COMPLICATIONS:  None.   CONDITION:  Stable to recovery.   BRIEF CLINICAL NOTE:  [name] is a 70 year old female with severe  rheumatoid arthritis, who has end-stage erosive arthritic changes in both  knees, the left more symptomatic than the right.  She has failed  nonoperative management and presents for total knee arthroplasty.   PROCEDURE IN DETAIL:  After successful administration of spinal anesthetic,  a tourniquet is placed high on her left thigh and left lower extremity  prepped and draped in the usual sterile fashion.  The extremity is wrapped  in Esmarch, knee flexed, and tourniquet inflated to 300 mmHg.  Standard  midline incision is made with a 10 blade through subcutaneous tissue to the  level of the extensor mechanism.  A fresh blade is used to make a medial  parapatellar arthrotomy, and the soft tissue over the proximal and medial  tibia is subperiosteally elevated to the joint line with a knife and into  the semimembranosus bursa with a Cobb elevator.  The soft tissue over the  proximal and lateral tibia is also elevated with attention being paid to  avoiding patellar tendon on tibial tubercle.  She has essentially fused her  patella down to her tibia and femur and had  to separate it with an osteotome  and remove excessive osteophytes.  Once this was performed, then I was able  to easily evert the patella.  Her preop range of motion was 25-75.  Once the  knee was opened up, I was able to get her flexed down to about 95.  The ACL  and PCL were already essentially gone.  The drill was then used to create a  starting hole on the distal femur, and the canal was irrigated.  The 5-  degree left valgus alignment guide is placed, and the distal femoral cutting  block is pinned to remove 11 mm off the distal femur.  I took an extra  millimeter due to the flexion contracture.  Distal femoral resection was  made with an oscillating saw.  Size 4 is the most appropriate femoral  component, and rotation is marked off the epicondylar axis.  The size 4  cutting block is placed, and the anterior, posterior, and chamfer cuts are  made.   The tibia is then subluxed forward and  the menisci removed.  Extramedullary  tibial alignment guide is placed referencing proximally at the medial aspect  of the tibial tubercle and distally along the second metatarsal axis and  tibial crest.  The block was pinned to remove approximately 4 mm off the  more deficient medial side.  Tibial resection is made with an oscillating  saw.  Size 3 is the most appropriate tibial component, and then the proximal  tibia is prepared with the modular drill and keel punch for a size 3.  Femoral preparation is completed with the intercondylar cut for the size 4.   Size 3 mobile bearing tibial trial with a size 4 posterior stabilized  femoral trial and a 10 mm posterior stabilized rotating platform insert  trial are placed.  With the 10, she was within about 5 degrees of full  extension and had excellent varus and valgus balance throughout full range  of motion.  The patella was then everted, thickness measured to be 23 mm.  Freehand resection is taken to 13 mm.  A 38 template is placed; lug holes  are  drilled; trial patella is placed, and it tracks normally.  Tibial insert  is then removed, and the osteophytes are taken off the posterior femur.  I  placed a 10 mL trial again and with cleaning out posteriorly, I was then  able to achieve full extension.  All trials were then removed, and the cut  bone surfaces are prepared with pulsatile lavage.  Cement is mixed and once  ready for implantation, the size 3 mobile bearing tibial tray, size 4  posterior stabilized femur, and 38 patella are cemented into place, and the  patella is held the clamp.  Trial 10 mL insert is placed, knee held in full  extension, and all extruded cement removed.  Once the cement is fully  hardened, then the permanent 10 mm posterior stabilized rotating platform  insert is placed into the tibial tray.  The wound is copiously irrigated  with saline solution and the extensor mechanism closed over a Hemovac drain  with interrupted #1 PDS.  Flexion against gravity is about 110 degrees.  Tourniquet is released for a total time of 47 minutes.  Subcu is closed with  interrupted 2-0 Vicryl, subcuticular running 4-0 Monocryl.  Drain is hooked  to suction.  The incision is cleaned and dried and Steri-Strips and a bulky  sterile dressing applied.  She is placed into a knee immobilizer, awakened,  and transported to recovery in stable condition.       FA/MEDQ  D:  11/30/2004  T:  11/30/2004  Job:  [id number]

## 2010-09-18 NOTE — H&P (Signed)
[name], [name]             ACCOUNT NO.:  [id number]   MEDICAL RECORD NO.:  [id number]          PATIENT TYPE:  INP   LOCATION:  1509                         [facility]ACILITY:  [facility]   PHYSICIAN:  Ollen Gross, M.D.    DATE OF BIRTH:  Jan 01, 1941   DATE OF ADMISSION:  11/30/2004  DATE OF DISCHARGE:                                HISTORY & PHYSICAL   CHIEF COMPLAINT:  Left knee pain.   HISTORY OF PRESENT ILLNESS:  Patient is a 70 year old female with a  longstanding history of progressively worsening rheumatoid arthritis.  Her  diagnosis dates back to the 34s.  She has had multiple joint involvement  with her rheumatoid disease.  She has been on different medications recently  that have helped.  She has had several surgeries in the past.  She is  currently at a point where both of her knees are hurting, the left more  symptomatic than the right.  She is seen in consultation by Dr. Despina Hick and  found to have severe tricompartmental end-stage arthritis on the left with  bone-on-bone in all three compartments with absolutely no joint space left  and large posterior osteophytes noted.  She does have moderately advanced  degeneration of the knees.  She has reached a point where she is having  constant pain and dysfunction.  It is felt that she would benefit from  undergoing total knee replacement.  The risks and benefits have been  discussed.  The patient has elected to proceed with surgery.   ALLERGIES:  1.  SULFA.  2.  PLAQUENIL.  3.  GOLD SHOTS.   CURRENT MEDICATIONS:  1.  Naproxen 375 twice daily.  2.  Actoplus 15/500 daily.  3.  Atacand 16 mg daily.  4.  Fexofenadine 180 mg daily.  5.  Clonidine 0.2 mg 1/2 tablet daily.  6.  Prevacid 30 mg twice daily.  7.  Glimepiride 4 mg daily.  8.  Hydrochlorothiazide 25 mg daily.  9.  Metoprolol 50 mg 2 tablets in the morning, 1 tablet in the evening.  10. Valium 5 mg 1 tablet 3 times a day.  11. Oxycodone twice daily.  12. OxyContin  20 mg 2 tablets in the morning and 1 tablet in the evening, 1      tablet at bedtime.  13. Enbrel injection twice weekly.   PAST MEDICAL HISTORY:  1.  Anxiety.  2.  Pulmonary rheumatoid nodule.  3.  Past history of pneumothorax x2.  4.  Hypertension.  5.  Hiatal hernia.  6.  Reflux disease.  7.  Hemorrhoids.  8.  Urinary incontinence.  9.  Non-insulin-dependent diabetes.  10. History of left leg deep venous thrombosis.  11. Rheumatoid arthritis.  12. Cervical degenerative disk disease.  13. Lumbar degenerative disk disease.  14. Ovarian cystic disease.  15. Fibrocystic breast disease.   PAST SURGICAL HISTORY:  1.  Right eye surgery.  2.  Knee surgery.  3.  Right wrist surgery.  4.  Thumb surgery.  5.  Great toe surgery.  6.  Right lung procedure.  7.  Appendectomy.  8.  Breast  cyst excision.  9.  Cholecystectomy.   SOCIAL HISTORY:  Married.  Nonsmoker.  No alcohol.  Has two children.  Husband will be assisting with care after surgery.   FAMILY HISTORY:  Mother with a history of stroke, hypertension, and heart  disease.  Father with a history of hypertension, stroke.  Non-Hodgkin's  lymphoma runs in the family along with diabetes and arthritis.   REVIEW OF SYSTEMS:  GENERAL:  No fevers, chills, night sweats.  NEURO:  She  occasionally has some blurred vision but none at this time.  No seizures,  syncope, or paralysis.  RESPIRATORY:  She does have o shortness of breath  with exertion.  No shortness of breath at rest.  She has had a history of  pneumothorax with a previous thoracocentesis and also after her last lung  surgery.  She currently has another pulmonary rheumatoid nodule.  CARDIOVASCULAR:  No chest pain, angina, orthopnea.  GI:  Patient does have  some constipation.  No nausea, vomiting.  No diarrhea.  No blood or mucus in  the stool.  GU:  She does have some weak stream.  No dysuria or hematuria.  MUSCULOSKELETAL:  Left knee, from the history of present  illness.   PHYSICAL EXAMINATION:  VITAL SIGNS:  Pulse 64, respirations 12, blood  pressure 118/64.  GENERAL:  A 70 year old white female who is well-developed and well-  nourished, short stature.  In no acute distress.  She is alert, oriented and  cooperative.  Very pleasant.  HEENT:  Normocephalic and atraumatic.  Pupils are round and reactive.  Oropharynx is clear.  EOMs are intact.  Patient does wear glasses.  Upper  and lower denture plates are noted.  NECK:  Supple.  CHEST:  Clear.  HEART:  Regular rate and rhythm.  No murmurs.  ABDOMEN:  Soft, nontender.  Bowel sounds present.  RECTAL/BREASTS/GENITALIA:  Not done.  Not pertinent to the present illness.  EXTREMITIES:  Left knee shows a previous medial arthrotomy scar.  Range of  motion 10-90.  Marked crepitus noted.  No effusion.   IMPRESSION:  1.  Rheumatoid arthritis of the left knee.  2.  Anxiety.  3.  History of pneumothorax x2.  4.  Pulmonary rheumatoid nodule.  5.  Hypertension.  6.  Hiatal hernia.  7.  Reflux disease.  8.  Hemorrhoids.  9.  Urinary incontinence.  10. Non-insulin-dependent diabetes mellitus.  11. History of leg deep venous thrombosis.  12. Rheumatoid arthritis.  13. Cervical degenerative disk disease.  14. Back degenerative disk disease.  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  16. History of fibrocystic breast disease.   PLAN:  Patient will be admitted to [facility] to undergo a left  total knee replacement/arthroplasty.  Surgery will be performed by Dr. Trudee Grip.       ALP/MEDQ  D:  11/30/2004  T:  11/30/2004  Job:  [id number]   cc:   Dr. Lucila Maine   Dr. Cori Razor [place], [place]

## 2010-09-26 ENCOUNTER — Encounter: Payer: Self-pay | Admitting: Cardiology

## 2010-09-28 ENCOUNTER — Encounter: Payer: Self-pay | Admitting: Cardiology

## 2010-09-29 ENCOUNTER — Encounter: Payer: Self-pay | Admitting: Cardiology

## 2010-09-30 ENCOUNTER — Encounter: Payer: Self-pay | Admitting: Cardiology

## 2010-12-01 ENCOUNTER — Encounter: Payer: Self-pay | Admitting: Cardiology

## 2010-12-03 ENCOUNTER — Telehealth: Payer: Self-pay | Admitting: Cardiology

## 2010-12-03 NOTE — Telephone Encounter (Addendum)
Release faxed to [facility] @ [phone]  12/03/10/KM  Records receievd from [facility] gave to [facility]  12/15/10/KM

## 2010-12-07 ENCOUNTER — Encounter: Payer: Self-pay | Admitting: Cardiology

## 2010-12-07 ENCOUNTER — Ambulatory Visit (INDEPENDENT_AMBULATORY_CARE_PROVIDER_SITE_OTHER): Payer: Federal, State, Local not specified - PPO | Admitting: Cardiology

## 2010-12-07 VITALS — BP 174/79 | HR 77 | Ht 60.0 in | Wt 181.0 lb

## 2010-12-07 DIAGNOSIS — I2510 Atherosclerotic heart disease of native coronary artery without angina pectoris: Secondary | ICD-10-CM

## 2010-12-07 DIAGNOSIS — R0600 Dyspnea, unspecified: Secondary | ICD-10-CM

## 2010-12-07 DIAGNOSIS — R0609 Other forms of dyspnea: Secondary | ICD-10-CM

## 2010-12-07 DIAGNOSIS — M069 Rheumatoid arthritis, unspecified: Secondary | ICD-10-CM

## 2010-12-07 DIAGNOSIS — I10 Essential (primary) hypertension: Secondary | ICD-10-CM

## 2010-12-07 NOTE — Patient Instructions (Signed)
Your physician recommends that you schedule a follow-up appointment in: 6 weeks with Dr. Stuckey. 

## 2010-12-08 DIAGNOSIS — R0600 Dyspnea, unspecified: Secondary | ICD-10-CM | POA: Insufficient documentation

## 2010-12-08 DIAGNOSIS — I2510 Atherosclerotic heart disease of native coronary artery without angina pectoris: Secondary | ICD-10-CM | POA: Insufficient documentation

## 2010-12-08 HISTORY — DX: Dyspnea, unspecified: R06.00

## 2010-12-08 HISTORY — DX: Atherosclerotic heart disease of native coronary artery without angina pectoris: I25.10

## 2010-12-08 LAB — BASIC METABOLIC PANEL
BUN: 22 mg/dL (ref 6–23)
GFR: 84.06 mL/min (ref >60.00)
Glucose, Bld: 73 mg/dL (ref 70–99)
Potassium: 4.2 mEq/L (ref 3.5–5.1)

## 2010-12-08 NOTE — Assessment & Plan Note (Signed)
This could be the source of dyspnea.  She needs to keep an eye on pressures, and these may need further treatment.  She is on several meds at present.

## 2010-12-08 NOTE — Assessment & Plan Note (Signed)
Source of this is unclear.  She has had recent pneumonia.  She has generally been inactive.  There is no obvious evidence of DVT on exam.  I doubt that she is volume overloaded.  She is hypertensive.  We will continue to monitor, and I will see her back in six weeks in follow up.  Will check BMET and MG at the present time.

## 2010-12-08 NOTE — Assessment & Plan Note (Signed)
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  Doubt active at this time.  Would favor continued medical therapy.  She is currently on statin therapy.

## 2010-12-08 NOTE — Assessment & Plan Note (Signed)
Currently followed at [facility]

## 2010-12-08 NOTE — Progress Notes (Signed)
HPI:  [name] is in for follow up.  She has had moderate shortness of breath with activity.  The source of this is unclear.  She was hospitalized within the past few months at [facility], and apparently thought to have pneumonia.  None of the details are available at this time.  She said she was also told her renal function was only 17% of normal.  She has been on lasix and K from her primary MD because of swelling in the legs for the past year or so.  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  She also has IDDM.  She underwent cath in November, the results of which are in the chart.  To summarize, she had a 50-60% lesion in the RCA which we did not feel was likely significant.  Denies any progressive chest pain at the present time.    Current Outpatient Prescriptions  Medication Sig Dispense Refill  . candesartan (ATACAND) 16 MG tablet Take 16 mg by mouth daily.        . diazepam (VALIUM) 5 MG tablet Take 5 mg by mouth 3 (three) times daily.        Marland Kitchen etanercept (ENBREL SURECLICK) 50 MG/ML injection Inject 50 mg into the skin once a week.        . ezetimibe-simvastatin (VYTORIN) 10-20 MG per tablet Take 1 tablet by mouth at bedtime.        . furosemide (LASIX) 40 MG tablet Take 40 mg by mouth daily.        . insulin NPH-insulin regular (NOVOLIN 70/30) (70-30) 100 UNIT/ML injection Inject 48 Units into the skin daily with breakfast. 10 units in p.m.       Marland Kitchen L-Methylfolate-B6-B12 (METANX) 2.8-25-2 MG TABS Take 1 tablet by mouth daily.        . lansoprazole (PREVACID) 30 MG capsule Take 30 mg by mouth daily.        Marland Kitchen levothyroxine (SYNTHROID, LEVOTHROID) 75 MCG tablet Take 75 mcg by mouth daily.        . metFORMIN (GLUCOPHAGE) 1000 MG tablet Take 1/2 tab twice a day       . naproxen (NAPROSYN) 375 MG tablet Take 375 mg by mouth 2 (two) times daily with a meal.        . oxyCODONE (OXY IR/ROXICODONE) 5 MG immediate release tablet Take 5 mg by mouth 2 (two) times daily.        Marland Kitchen oxyCODONE  (OXYCONTIN) 40 MG 12 hr tablet Take 40 mg by mouth every 8 (eight) hours.        . potassium chloride (KLOR-CON 10) 10 MEQ CR tablet Take 10 mEq by mouth 2 (two) times daily. When taking furosemide       . predniSONE (DELTASONE) 5 MG tablet Take 5 mg by mouth 2 (two) times daily.          Allergies  Allergen Reactions  . Latex   . Sulfonamide Derivatives     Past Medical History  Diagnosis Date  . HTN (hypertension)   . Hyperlipidemia   . Insulin dependent diabetes mellitus   . Hypothyroidism   . Obesity     Past Surgical History  Procedure Date  . Abnormal lexiscan     at [facility] on October 20,2011. demonstrating small area of ischemia in the midportion of the anterior septal wall with preserved left ventricular ejection fraction  . Wrist surgery   . Hand surgery  Right  . Biteral knee surgery   . Lung surgery     x 2  . Nodule removal   . Appendectomy   . Cholecystectomy   . Eye surgery     as a child    Family History  Problem Relation Age of Onset  . Lymphoma Mother   . Stroke Mother     questionable  . Heart attack Father 40    History   Social History  . Marital Status: Married    Spouse Name: N/A    Number of Children: 2  . Years of Education: N/A   Occupational History  . Not on file.   Social History Main Topics  . Smoking status: Never Smoker   . Smokeless tobacco: Not on file  . Alcohol Use: No  . Drug Use: No  . Sexually Active: Not on file   Other Topics Concern  . Not on file   Social History Narrative   Lives in [place] with her husband.    ROS: Please see the HPI.  All other systems reviewed and negative.  PHYSICAL EXAM:  BP 174/79  Pulse 77  Ht 5' (1.524 m)  Wt 181 lb (82.101 kg)  BMI 35.35 kg/m2  General: Small, chronically ill appearing with severe RA changes Head:  Normocephalic and atraumatic. Neck: no JVD Lungs: Clear to auscultation and percussion. Heart: Normal S1 and S2.  No murmur, rubs or  gallops.  Abdomen:  Normal bowel sounds; soft; non tender; no organomegaly Pulses: Pulses normal in all 4 extremities. Extremities: Severe changes of RA.  Some rA nodules on R elbow.   Neurologic: Alert and oriented x 3.  EKG:  NSR.  PVCS.  Borderline first degree AV block.    ASSESSMENT AND PLAN:

## 2011-01-18 ENCOUNTER — Ambulatory Visit (INDEPENDENT_AMBULATORY_CARE_PROVIDER_SITE_OTHER): Payer: Federal, State, Local not specified - PPO | Admitting: Cardiology

## 2011-01-18 ENCOUNTER — Encounter: Payer: Self-pay | Admitting: Cardiology

## 2011-01-18 DIAGNOSIS — M069 Rheumatoid arthritis, unspecified: Secondary | ICD-10-CM

## 2011-01-18 DIAGNOSIS — R0989 Other specified symptoms and signs involving the circulatory and respiratory systems: Secondary | ICD-10-CM

## 2011-01-18 DIAGNOSIS — I2510 Atherosclerotic heart disease of native coronary artery without angina pectoris: Secondary | ICD-10-CM

## 2011-01-18 DIAGNOSIS — I10 Essential (primary) hypertension: Secondary | ICD-10-CM

## 2011-01-18 DIAGNOSIS — R0600 Dyspnea, unspecified: Secondary | ICD-10-CM

## 2011-01-18 NOTE — Patient Instructions (Signed)
Your physician wants you to follow-up in: 6 MONTHS.  You will receive a reminder letter in the mail two months in advance. If you don't receive a letter, please call our office to schedule the follow-up appointment.  Your physician recommends that you continue on your current medications as directed. Please refer to the Current Medication list given to you today.  

## 2011-01-27 NOTE — Progress Notes (Signed)
HPI:  The patient's arthritis is worse.  She has trouble bearing weight in her groin and hip.  She went to the ER on August 23, and had a UTI, and was treated with antibiotics.  She has very little strength in her hands.  She has virtually no strength.  Her energy level is also low.  She has not had progressive chest pain at this point in time.  Her renal function apparently was problematic on Naprosyn.   She is on a number of meds that are associated with this, but does have crippling arthritis.    Current Outpatient Prescriptions  Medication Sig Dispense Refill  . AMITIZA 24 MCG capsule       . B-D ULTRAFINE III SHORT PEN 31G X 8 MM MISC       . candesartan (ATACAND) 16 MG tablet Take 16 mg by mouth daily.        . diazepam (VALIUM) 5 MG tablet Take 5 mg by mouth 3 (three) times daily.        Marland Kitchen etanercept (ENBREL SURECLICK) 50 MG/ML injection Inject 50 mg into the skin once a week.        . ezetimibe-simvastatin (VYTORIN) 10-20 MG per tablet Take 1 tablet by mouth at bedtime.        Marland Kitchen FREESTYLE LITE test strip       . furosemide (LASIX) 40 MG tablet Take 40 mg by mouth daily.        . insulin NPH-insulin regular (NOVOLIN 70/30) (70-30) 100 UNIT/ML injection Inject 48 Units into the skin daily with breakfast. 10 units in p.m.       . KLOR-CON M10 10 MEQ tablet 10 mEq 2 (two) times daily.       Marland Kitchen L-Methylfolate-B6-B12 (METANX) 2.8-25-2 MG TABS Take 1 tablet by mouth daily.        . lansoprazole (PREVACID) 30 MG capsule Take 30 mg by mouth daily.        Marland Kitchen levothyroxine (SYNTHROID, LEVOTHROID) 75 MCG tablet Take 75 mcg by mouth daily.        . metFORMIN (GLUCOPHAGE) 1000 MG tablet Take 1/2 tab twice a day       . oxyCODONE (OXY IR/ROXICODONE) 5 MG immediate release tablet Take 5 mg by mouth 2 (two) times daily.        Marland Kitchen oxyCODONE (OXYCONTIN) 40 MG 12 hr tablet Take 40 mg by mouth every 8 (eight) hours.        . potassium chloride (KLOR-CON 10) 10 MEQ CR tablet Take 10 mEq by mouth 2 (two) times  daily. When taking furosemide       . predniSONE (DELTASONE) 5 MG tablet Take 5 mg by mouth 2 (two) times daily.        Marland Kitchen triamcinolone (KENALOG) 0.1 % cream         Allergies  Allergen Reactions  . Latex   . Sulfonamide Derivatives     Past Medical History  Diagnosis Date  . HTN (hypertension)   . Hyperlipidemia   . Insulin dependent diabetes mellitus   . Hypothyroidism   . Obesity     Past Surgical History  Procedure Date  . Abnormal lexiscan     at [facility] on October 20,2011. demonstrating small area of ischemia in the midportion of the anterior septal wall with preserved left ventricular ejection fraction  . Wrist surgery   . Hand surgery     Right  . Biteral knee surgery   .  Lung surgery     x 2  . Nodule removal   . Appendectomy   . Cholecystectomy   . Eye surgery     as a child    Family History  Problem Relation Age of Onset  . Lymphoma Mother   . Stroke Mother     questionable  . Heart attack Father 35    History   Social History  . Marital Status: Married    Spouse Name: N/A    Number of Children: 2  . Years of Education: N/A   Occupational History  . Not on file.   Social History Main Topics  . Smoking status: Never Smoker   . Smokeless tobacco: Not on file  . Alcohol Use: No  . Drug Use: No  . Sexually Active: Not on file   Other Topics Concern  . Not on file   Social History Narrative   Lives in [place] with her husband.    ROS: Please see the HPI.  All other systems reviewed and negative.  PHYSICAL EXAM:  BP 143/67  Pulse 67  Ht 4\' 11"  (1.499 m)  Wt 175 lb (79.379 kg)  BMI 35.35 kg/m2  General: Well developed, well nourished, in no acute distress.  Appears chronically ill. Head:  Normocephalic and atraumatic. Neck: no JVD Lungs: Clear to auscultation and percussion. Heart: Normal S1 and S2.  No murmur, rubs or gallops.  Abdomen:  Normal bowel sounds; soft; non tender; no organomegaly Pulses: Pulses normal  in all 4 extremities. Extremities: No clubbing or cyanosis. No edema. Neurologic: Alert and oriented x 3.  EKG:  ASSESSMENT AND PLAN:

## 2011-01-28 LAB — BASIC METABOLIC PANEL
BUN: 22
CO2: 26
Chloride: 101
Potassium: 4.1

## 2011-01-28 LAB — CBC
HCT: 35.1 — ABNORMAL LOW
Hemoglobin: 12.3
MCHC: 35.2
RBC: 3.63 — ABNORMAL LOW

## 2011-01-28 LAB — POCT CARDIAC MARKERS
Myoglobin, poc: 267
Operator id: 146091

## 2011-01-28 LAB — DIFFERENTIAL
Basophils Relative: 0
Lymphocytes Relative: 5 — ABNORMAL LOW
Monocytes Absolute: 0.7
Monocytes Relative: 5
Neutro Abs: 12.3 — ABNORMAL HIGH

## 2011-01-28 LAB — RAPID STREP SCREEN (MED CTR MEBANE ONLY): Streptococcus, Group A Screen (Direct): NEGATIVE

## 2011-01-28 LAB — URINE MICROSCOPIC-ADD ON

## 2011-01-28 LAB — URINALYSIS, ROUTINE W REFLEX MICROSCOPIC
Bilirubin Urine: NEGATIVE
Hgb urine dipstick: NEGATIVE
Specific Gravity, Urine: 1.015
pH: 5.5

## 2011-02-08 NOTE — Assessment & Plan Note (Signed)
Her BP is controlled. 

## 2011-02-08 NOTE — Assessment & Plan Note (Signed)
Her activity level has diminished.  She has not visible signs of cardiac deterioration, but she is, with RA, subject to both pleural and pericardial effusions as part of her disease process.  Her CAD is stable at last cath.

## 2011-02-08 NOTE — Assessment & Plan Note (Signed)
See most recent cath data.  Will continue to follow at this point in time.

## 2011-02-08 NOTE — Assessment & Plan Note (Signed)
Severe and crippling.

## 2011-03-15 ENCOUNTER — Other Ambulatory Visit: Payer: Self-pay | Admitting: Orthopedic Surgery

## 2011-03-15 DIAGNOSIS — M545 Low back pain: Secondary | ICD-10-CM

## 2011-03-20 ENCOUNTER — Ambulatory Visit
Admission: RE | Admit: 2011-03-20 | Discharge: 2011-03-20 | Disposition: A | Discharge status: Home / Self Care | Payer: Medicare Other | Source: Ambulatory Visit | Attending: Orthopedic Surgery | Admitting: Orthopedic Surgery

## 2011-03-20 DIAGNOSIS — M545 Low back pain: Secondary | ICD-10-CM

## 2011-03-29 DIAGNOSIS — K219 Gastro-esophageal reflux disease without esophagitis: Secondary | ICD-10-CM

## 2011-03-29 DIAGNOSIS — J309 Allergic rhinitis, unspecified: Secondary | ICD-10-CM | POA: Insufficient documentation

## 2011-03-29 DIAGNOSIS — E785 Hyperlipidemia, unspecified: Secondary | ICD-10-CM | POA: Insufficient documentation

## 2011-03-29 DIAGNOSIS — F419 Anxiety disorder, unspecified: Secondary | ICD-10-CM | POA: Insufficient documentation

## 2011-03-29 DIAGNOSIS — E039 Hypothyroidism, unspecified: Secondary | ICD-10-CM | POA: Insufficient documentation

## 2011-03-29 HISTORY — DX: Allergic rhinitis, unspecified: J30.9

## 2011-03-29 HISTORY — DX: Anxiety disorder, unspecified: F41.9

## 2011-03-29 HISTORY — DX: Gastro-esophageal reflux disease without esophagitis: K21.9

## 2011-03-31 DIAGNOSIS — M0579 Rheumatoid arthritis with rheumatoid factor of multiple sites without organ or systems involvement: Secondary | ICD-10-CM

## 2011-03-31 HISTORY — DX: Rheumatoid arthritis with rheumatoid factor of multiple sites without organ or systems involvement: M05.79

## 2011-05-21 DIAGNOSIS — I10 Essential (primary) hypertension: Secondary | ICD-10-CM | POA: Diagnosis not present

## 2011-05-21 DIAGNOSIS — M069 Rheumatoid arthritis, unspecified: Secondary | ICD-10-CM | POA: Diagnosis not present

## 2011-05-21 DIAGNOSIS — Z79899 Other long term (current) drug therapy: Secondary | ICD-10-CM | POA: Diagnosis not present

## 2011-05-27 DIAGNOSIS — M5030 Other cervical disc degeneration, unspecified cervical region: Secondary | ICD-10-CM | POA: Diagnosis not present

## 2011-05-27 DIAGNOSIS — G5790 Unspecified mononeuropathy of unspecified lower limb: Secondary | ICD-10-CM | POA: Diagnosis not present

## 2011-05-27 DIAGNOSIS — M069 Rheumatoid arthritis, unspecified: Secondary | ICD-10-CM | POA: Diagnosis not present

## 2011-05-27 DIAGNOSIS — Z5189 Encounter for other specified aftercare: Secondary | ICD-10-CM | POA: Diagnosis not present

## 2011-06-04 DIAGNOSIS — E1029 Type 1 diabetes mellitus with other diabetic kidney complication: Secondary | ICD-10-CM | POA: Diagnosis not present

## 2011-06-04 DIAGNOSIS — R609 Edema, unspecified: Secondary | ICD-10-CM | POA: Diagnosis not present

## 2011-06-04 DIAGNOSIS — Z79899 Other long term (current) drug therapy: Secondary | ICD-10-CM | POA: Diagnosis not present

## 2011-06-04 DIAGNOSIS — E1065 Type 1 diabetes mellitus with hyperglycemia: Secondary | ICD-10-CM | POA: Diagnosis not present

## 2011-06-04 DIAGNOSIS — Z23 Encounter for immunization: Secondary | ICD-10-CM | POA: Diagnosis not present

## 2011-06-09 DIAGNOSIS — M069 Rheumatoid arthritis, unspecified: Secondary | ICD-10-CM | POA: Diagnosis not present

## 2011-06-09 DIAGNOSIS — K219 Gastro-esophageal reflux disease without esophagitis: Secondary | ICD-10-CM | POA: Diagnosis not present

## 2011-06-09 DIAGNOSIS — I10 Essential (primary) hypertension: Secondary | ICD-10-CM | POA: Diagnosis not present

## 2011-06-09 DIAGNOSIS — R490 Dysphonia: Secondary | ICD-10-CM | POA: Diagnosis not present

## 2011-06-09 DIAGNOSIS — G5610 Other lesions of median nerve, unspecified upper limb: Secondary | ICD-10-CM | POA: Diagnosis not present

## 2011-06-09 DIAGNOSIS — G5620 Lesion of ulnar nerve, unspecified upper limb: Secondary | ICD-10-CM | POA: Diagnosis not present

## 2011-06-09 DIAGNOSIS — E119 Type 2 diabetes mellitus without complications: Secondary | ICD-10-CM | POA: Diagnosis not present

## 2011-06-22 DIAGNOSIS — H2510 Age-related nuclear cataract, unspecified eye: Secondary | ICD-10-CM | POA: Diagnosis not present

## 2011-07-06 DIAGNOSIS — IMO0002 Reserved for concepts with insufficient information to code with codable children: Secondary | ICD-10-CM | POA: Diagnosis not present

## 2011-07-06 DIAGNOSIS — H2510 Age-related nuclear cataract, unspecified eye: Secondary | ICD-10-CM | POA: Diagnosis not present

## 2011-07-16 DIAGNOSIS — H2510 Age-related nuclear cataract, unspecified eye: Secondary | ICD-10-CM | POA: Diagnosis not present

## 2011-08-03 DIAGNOSIS — M069 Rheumatoid arthritis, unspecified: Secondary | ICD-10-CM | POA: Diagnosis not present

## 2011-08-03 DIAGNOSIS — M5030 Other cervical disc degeneration, unspecified cervical region: Secondary | ICD-10-CM | POA: Diagnosis not present

## 2011-08-03 DIAGNOSIS — G5790 Unspecified mononeuropathy of unspecified lower limb: Secondary | ICD-10-CM | POA: Diagnosis not present

## 2011-08-27 DIAGNOSIS — G5620 Lesion of ulnar nerve, unspecified upper limb: Secondary | ICD-10-CM | POA: Diagnosis not present

## 2011-08-27 DIAGNOSIS — R1310 Dysphagia, unspecified: Secondary | ICD-10-CM | POA: Diagnosis not present

## 2011-08-27 DIAGNOSIS — K449 Diaphragmatic hernia without obstruction or gangrene: Secondary | ICD-10-CM | POA: Diagnosis not present

## 2011-08-27 DIAGNOSIS — K219 Gastro-esophageal reflux disease without esophagitis: Secondary | ICD-10-CM | POA: Diagnosis not present

## 2011-08-27 DIAGNOSIS — G5600 Carpal tunnel syndrome, unspecified upper limb: Secondary | ICD-10-CM | POA: Diagnosis not present

## 2011-08-27 DIAGNOSIS — Z79899 Other long term (current) drug therapy: Secondary | ICD-10-CM | POA: Diagnosis not present

## 2011-08-27 DIAGNOSIS — M069 Rheumatoid arthritis, unspecified: Secondary | ICD-10-CM | POA: Diagnosis not present

## 2011-08-30 DIAGNOSIS — R609 Edema, unspecified: Secondary | ICD-10-CM | POA: Diagnosis not present

## 2011-09-09 DIAGNOSIS — E1129 Type 2 diabetes mellitus with other diabetic kidney complication: Secondary | ICD-10-CM | POA: Diagnosis not present

## 2011-09-09 DIAGNOSIS — D649 Anemia, unspecified: Secondary | ICD-10-CM

## 2011-09-09 DIAGNOSIS — R609 Edema, unspecified: Secondary | ICD-10-CM | POA: Diagnosis not present

## 2011-09-09 DIAGNOSIS — E785 Hyperlipidemia, unspecified: Secondary | ICD-10-CM | POA: Diagnosis not present

## 2011-09-09 DIAGNOSIS — G5620 Lesion of ulnar nerve, unspecified upper limb: Secondary | ICD-10-CM | POA: Diagnosis not present

## 2011-09-09 DIAGNOSIS — D539 Nutritional anemia, unspecified: Secondary | ICD-10-CM | POA: Insufficient documentation

## 2011-09-09 DIAGNOSIS — N058 Unspecified nephritic syndrome with other morphologic changes: Secondary | ICD-10-CM | POA: Diagnosis not present

## 2011-09-09 DIAGNOSIS — M069 Rheumatoid arthritis, unspecified: Secondary | ICD-10-CM | POA: Diagnosis not present

## 2011-09-09 DIAGNOSIS — M129 Arthropathy, unspecified: Secondary | ICD-10-CM | POA: Diagnosis not present

## 2011-09-09 DIAGNOSIS — R809 Proteinuria, unspecified: Secondary | ICD-10-CM | POA: Diagnosis not present

## 2011-09-09 DIAGNOSIS — K219 Gastro-esophageal reflux disease without esophagitis: Secondary | ICD-10-CM | POA: Diagnosis not present

## 2011-09-09 DIAGNOSIS — Z809 Family history of malignant neoplasm, unspecified: Secondary | ICD-10-CM | POA: Diagnosis not present

## 2011-09-09 DIAGNOSIS — I129 Hypertensive chronic kidney disease with stage 1 through stage 4 chronic kidney disease, or unspecified chronic kidney disease: Secondary | ICD-10-CM | POA: Diagnosis not present

## 2011-09-09 DIAGNOSIS — M2550 Pain in unspecified joint: Secondary | ICD-10-CM | POA: Diagnosis not present

## 2011-09-09 DIAGNOSIS — I10 Essential (primary) hypertension: Secondary | ICD-10-CM | POA: Diagnosis not present

## 2011-09-09 DIAGNOSIS — J8410 Pulmonary fibrosis, unspecified: Secondary | ICD-10-CM | POA: Diagnosis not present

## 2011-09-09 DIAGNOSIS — N182 Chronic kidney disease, stage 2 (mild): Secondary | ICD-10-CM | POA: Diagnosis not present

## 2011-09-09 DIAGNOSIS — F411 Generalized anxiety disorder: Secondary | ICD-10-CM | POA: Diagnosis not present

## 2011-09-09 DIAGNOSIS — M0510 Rheumatoid lung disease with rheumatoid arthritis of unspecified site: Secondary | ICD-10-CM | POA: Diagnosis not present

## 2011-09-09 DIAGNOSIS — I2789 Other specified pulmonary heart diseases: Secondary | ICD-10-CM | POA: Diagnosis not present

## 2011-09-09 DIAGNOSIS — G5600 Carpal tunnel syndrome, unspecified upper limb: Secondary | ICD-10-CM | POA: Diagnosis not present

## 2011-09-09 DIAGNOSIS — Z882 Allergy status to sulfonamides status: Secondary | ICD-10-CM | POA: Diagnosis not present

## 2011-09-09 DIAGNOSIS — J309 Allergic rhinitis, unspecified: Secondary | ICD-10-CM | POA: Diagnosis not present

## 2011-09-09 DIAGNOSIS — M3500 Sicca syndrome, unspecified: Secondary | ICD-10-CM | POA: Diagnosis not present

## 2011-09-09 DIAGNOSIS — E039 Hypothyroidism, unspecified: Secondary | ICD-10-CM | POA: Diagnosis not present

## 2011-09-09 DIAGNOSIS — M7989 Other specified soft tissue disorders: Secondary | ICD-10-CM | POA: Diagnosis not present

## 2011-09-09 DIAGNOSIS — IMO0002 Reserved for concepts with insufficient information to code with codable children: Secondary | ICD-10-CM | POA: Diagnosis not present

## 2011-09-09 HISTORY — DX: Anemia, unspecified: D64.9

## 2011-09-14 DIAGNOSIS — E119 Type 2 diabetes mellitus without complications: Secondary | ICD-10-CM | POA: Diagnosis not present

## 2011-10-08 DIAGNOSIS — M5030 Other cervical disc degeneration, unspecified cervical region: Secondary | ICD-10-CM | POA: Diagnosis not present

## 2011-10-08 DIAGNOSIS — G5790 Unspecified mononeuropathy of unspecified lower limb: Secondary | ICD-10-CM | POA: Diagnosis not present

## 2011-10-08 DIAGNOSIS — M069 Rheumatoid arthritis, unspecified: Secondary | ICD-10-CM | POA: Diagnosis not present

## 2011-10-26 DIAGNOSIS — M069 Rheumatoid arthritis, unspecified: Secondary | ICD-10-CM | POA: Diagnosis not present

## 2011-10-26 DIAGNOSIS — R609 Edema, unspecified: Secondary | ICD-10-CM | POA: Diagnosis not present

## 2011-10-26 DIAGNOSIS — K219 Gastro-esophageal reflux disease without esophagitis: Secondary | ICD-10-CM | POA: Diagnosis not present

## 2011-12-02 DIAGNOSIS — H1030 Unspecified acute conjunctivitis, unspecified eye: Secondary | ICD-10-CM | POA: Diagnosis not present

## 2011-12-07 DIAGNOSIS — M5030 Other cervical disc degeneration, unspecified cervical region: Secondary | ICD-10-CM | POA: Diagnosis not present

## 2011-12-07 DIAGNOSIS — M069 Rheumatoid arthritis, unspecified: Secondary | ICD-10-CM | POA: Diagnosis not present

## 2011-12-07 DIAGNOSIS — G5790 Unspecified mononeuropathy of unspecified lower limb: Secondary | ICD-10-CM | POA: Diagnosis not present

## 2011-12-08 DIAGNOSIS — G5600 Carpal tunnel syndrome, unspecified upper limb: Secondary | ICD-10-CM | POA: Diagnosis not present

## 2011-12-08 DIAGNOSIS — M129 Arthropathy, unspecified: Secondary | ICD-10-CM | POA: Diagnosis not present

## 2011-12-08 DIAGNOSIS — M19019 Primary osteoarthritis, unspecified shoulder: Secondary | ICD-10-CM | POA: Diagnosis not present

## 2011-12-08 DIAGNOSIS — Z96639 Presence of unspecified artificial wrist joint: Secondary | ICD-10-CM | POA: Diagnosis not present

## 2011-12-08 DIAGNOSIS — M069 Rheumatoid arthritis, unspecified: Secondary | ICD-10-CM | POA: Diagnosis not present

## 2011-12-08 DIAGNOSIS — G5620 Lesion of ulnar nerve, unspecified upper limb: Secondary | ICD-10-CM | POA: Diagnosis not present

## 2011-12-17 DIAGNOSIS — N390 Urinary tract infection, site not specified: Secondary | ICD-10-CM | POA: Diagnosis not present

## 2011-12-17 DIAGNOSIS — E782 Mixed hyperlipidemia: Secondary | ICD-10-CM | POA: Diagnosis not present

## 2011-12-17 DIAGNOSIS — E039 Hypothyroidism, unspecified: Secondary | ICD-10-CM | POA: Diagnosis not present

## 2011-12-17 DIAGNOSIS — Z79899 Other long term (current) drug therapy: Secondary | ICD-10-CM | POA: Diagnosis not present

## 2011-12-17 DIAGNOSIS — I10 Essential (primary) hypertension: Secondary | ICD-10-CM | POA: Diagnosis not present

## 2011-12-23 DIAGNOSIS — E119 Type 2 diabetes mellitus without complications: Secondary | ICD-10-CM | POA: Diagnosis not present

## 2011-12-23 DIAGNOSIS — M3500 Sicca syndrome, unspecified: Secondary | ICD-10-CM | POA: Diagnosis not present

## 2011-12-23 DIAGNOSIS — I270 Primary pulmonary hypertension: Secondary | ICD-10-CM | POA: Diagnosis not present

## 2011-12-23 DIAGNOSIS — M069 Rheumatoid arthritis, unspecified: Secondary | ICD-10-CM | POA: Diagnosis not present

## 2011-12-23 DIAGNOSIS — E039 Hypothyroidism, unspecified: Secondary | ICD-10-CM | POA: Diagnosis not present

## 2011-12-23 DIAGNOSIS — R609 Edema, unspecified: Secondary | ICD-10-CM | POA: Diagnosis not present

## 2011-12-23 DIAGNOSIS — E785 Hyperlipidemia, unspecified: Secondary | ICD-10-CM | POA: Diagnosis not present

## 2011-12-23 DIAGNOSIS — I10 Essential (primary) hypertension: Secondary | ICD-10-CM | POA: Diagnosis not present

## 2011-12-23 DIAGNOSIS — F411 Generalized anxiety disorder: Secondary | ICD-10-CM | POA: Diagnosis not present

## 2012-01-06 DIAGNOSIS — M75100 Unspecified rotator cuff tear or rupture of unspecified shoulder, not specified as traumatic: Secondary | ICD-10-CM

## 2012-01-06 DIAGNOSIS — M19019 Primary osteoarthritis, unspecified shoulder: Secondary | ICD-10-CM | POA: Diagnosis not present

## 2012-01-06 DIAGNOSIS — M75120 Complete rotator cuff tear or rupture of unspecified shoulder, not specified as traumatic: Secondary | ICD-10-CM | POA: Diagnosis not present

## 2012-01-06 HISTORY — DX: Unspecified rotator cuff tear or rupture of unspecified shoulder, not specified as traumatic: M75.100

## 2012-01-20 ENCOUNTER — Encounter: Payer: Self-pay | Admitting: Cardiology

## 2012-01-20 ENCOUNTER — Ambulatory Visit (INDEPENDENT_AMBULATORY_CARE_PROVIDER_SITE_OTHER): Payer: Medicare Other | Admitting: Cardiology

## 2012-01-20 VITALS — BP 134/64 | HR 79 | Ht 59.0 in | Wt 175.0 lb

## 2012-01-20 DIAGNOSIS — I10 Essential (primary) hypertension: Secondary | ICD-10-CM | POA: Diagnosis not present

## 2012-01-20 DIAGNOSIS — I2510 Atherosclerotic heart disease of native coronary artery without angina pectoris: Secondary | ICD-10-CM | POA: Diagnosis not present

## 2012-01-20 DIAGNOSIS — R0600 Dyspnea, unspecified: Secondary | ICD-10-CM

## 2012-01-20 DIAGNOSIS — R0609 Other forms of dyspnea: Secondary | ICD-10-CM

## 2012-01-20 DIAGNOSIS — R0989 Other specified symptoms and signs involving the circulatory and respiratory systems: Secondary | ICD-10-CM | POA: Diagnosis not present

## 2012-01-20 MED ORDER — VALSARTAN 320 MG PO TABS: 320.0000 mg | ORAL_TABLET | Freq: Every day | ORAL | Status: DC

## 2012-01-20 NOTE — Progress Notes (Signed)
HPI:  The patient returns in followup. She's been very limited by her rheumatoid arthritis. She had to come off of Enbrel.  As a result, her arthritis is worse. She's not had much the way of chest pain. She has had problems with cellulitis, infected toe on the right. She's not had any chest pain. Cardiac-wise she is remaining stable.  Current Outpatient Prescriptions  Medication Sig Dispense Refill  . B-D ULTRAFINE III SHORT PEN 31G X 8 MM MISC       . cloNIDine (CATAPRES) 0.1 MG tablet Take 1 tablet by mouth Three times a day.      . diazepam (VALIUM) 5 MG tablet Take 5 mg by mouth 3 (three) times daily.        Marland Kitchen ezetimibe-simvastatin (VYTORIN) 10-20 MG per tablet Take 1 tablet by mouth at bedtime.        Marland Kitchen FREESTYLE LITE test strip       . furosemide (LASIX) 40 MG tablet Take 40 mg by mouth daily.        . insulin NPH-insulin regular (NOVOLIN 70/30) (70-30) 100 UNIT/ML injection Inject 48 Units into the skin daily with breakfast. 10 units in p.m.       Marland Kitchen L-Methylfolate-B6-B12 (METANX) 2.8-25-2 MG TABS Take 1 tablet by mouth daily.        . lansoprazole (PREVACID) 30 MG capsule Take 30 mg by mouth daily.        Marland Kitchen levothyroxine (SYNTHROID, LEVOTHROID) 75 MCG tablet Take 75 mcg by mouth daily.        . metFORMIN (GLUCOPHAGE) 1000 MG tablet Take 500 mg by mouth 3 (three) times daily. Take 1/2 tab twice a day      . oxyCODONE (OXY IR/ROXICODONE) 5 MG immediate release tablet Take 5 mg by mouth 2 (two) times daily.        Marland Kitchen oxyCODONE (OXYCONTIN) 40 MG 12 hr tablet Take 40 mg by mouth every 8 (eight) hours.        . potassium chloride (KLOR-CON 10) 10 MEQ CR tablet Take 10 mEq by mouth 2 (two) times daily. When taking furosemide       . predniSONE (DELTASONE) 5 MG tablet Take 5 mg by mouth 2 (two) times daily.        Marland Kitchen triamcinolone (KENALOG) 0.1 % cream       . valsartan (DIOVAN) 320 MG tablet Take 1 tablet (320 mg total) by mouth daily.  90 tablet  4  . DISCONTD: DIOVAN 320 MG tablet Take 1  tablet by mouth daily.        Allergies  Allergen Reactions  . Latex   . Sulfonamide Derivatives     Past Medical History  Diagnosis Date  . HTN (hypertension)   . Hyperlipidemia   . Insulin dependent diabetes mellitus   . Hypothyroidism   . Obesity     Past Surgical History  Procedure Date  . Abnormal lexiscan     at [facility] on October 20,2011. demonstrating small area of ischemia in the midportion of the anterior septal wall with preserved left ventricular ejection fraction  . Wrist surgery   . Hand surgery     Right  . Biteral knee surgery   . Lung surgery     x 2  . Nodule removal   . Appendectomy   . Cholecystectomy   . Eye surgery     as a child    Family History  Problem Relation Age of Onset  .  Lymphoma Mother   . Stroke Mother     questionable  . Heart attack Father 62    History   Social History  . Marital Status: Married    Spouse Name: N/A    Number of Children: 2  . Years of Education: N/A   Occupational History  . Not on file.   Social History Main Topics  . Smoking status: Never Smoker   . Smokeless tobacco: Not on file  . Alcohol Use: No  . Drug Use: No  . Sexually Active: Not on file   Other Topics Concern  . Not on file   Social History Narrative   Lives in [place] with her husband.    ROS: Please see the HPI.  All other systems reviewed and negative.  PHYSICAL EXAM:  BP 134/64  Pulse 79  Ht 4\' 11"  (1.499 m)  Wt 175 lb (79.379 kg)  BMI 35.35 kg/m2  SpO2 90%  General: Mildly obese short female with changes of RA.   Head:  Normocephalic and atraumatic. Neck: no JVD Lungs: Clear to auscultation and percussion. Heart: Normal S1 and S2.  No murmur, rubs or gallops Pulses: Pulses normal in all 4 extremities. Extremities: No clubbing or cyanosis. Erythema LLE, with slight swelling  (doppler neg per patient).  Good pulse in DP both right and left.  Had husband feel.   Neurologic: Alert and oriented x  3.  EKG:  NSR.  First degree av block.  PVC singlet.    ASSESSMENT AND PLAN:

## 2012-01-20 NOTE — Assessment & Plan Note (Signed)
No change in status.

## 2012-01-20 NOTE — Assessment & Plan Note (Addendum)
Appears stable.  Nature of diabetic findings discussed.  Continue medical therapy.

## 2012-01-20 NOTE — Assessment & Plan Note (Signed)
Labile, and likely due to DM.  Better on clonidine.

## 2012-01-20 NOTE — Patient Instructions (Addendum)
Your physician wants you to follow-up in: 1 YEAR with Dr McLean.  You will receive a reminder letter in the mail two months in advance. If you don't receive a letter, please call our office to schedule the follow-up appointment.  Your physician recommends that you continue on your current medications as directed. Please refer to the Current Medication list given to you today.  

## 2012-01-31 DIAGNOSIS — Z23 Encounter for immunization: Secondary | ICD-10-CM | POA: Diagnosis not present

## 2012-02-03 DIAGNOSIS — M5030 Other cervical disc degeneration, unspecified cervical region: Secondary | ICD-10-CM | POA: Diagnosis not present

## 2012-02-03 DIAGNOSIS — M069 Rheumatoid arthritis, unspecified: Secondary | ICD-10-CM | POA: Diagnosis not present

## 2012-02-03 DIAGNOSIS — G5790 Unspecified mononeuropathy of unspecified lower limb: Secondary | ICD-10-CM | POA: Diagnosis not present

## 2012-02-04 DIAGNOSIS — S43429A Sprain of unspecified rotator cuff capsule, initial encounter: Secondary | ICD-10-CM | POA: Diagnosis not present

## 2012-02-04 DIAGNOSIS — M719 Bursopathy, unspecified: Secondary | ICD-10-CM | POA: Diagnosis not present

## 2012-02-04 DIAGNOSIS — M19019 Primary osteoarthritis, unspecified shoulder: Secondary | ICD-10-CM | POA: Diagnosis not present

## 2012-02-04 DIAGNOSIS — M75110 Incomplete rotator cuff tear or rupture of unspecified shoulder, not specified as traumatic: Secondary | ICD-10-CM | POA: Diagnosis not present

## 2012-02-04 DIAGNOSIS — M67919 Unspecified disorder of synovium and tendon, unspecified shoulder: Secondary | ICD-10-CM | POA: Diagnosis not present

## 2012-02-04 DIAGNOSIS — M25419 Effusion, unspecified shoulder: Secondary | ICD-10-CM | POA: Diagnosis not present

## 2012-02-21 DIAGNOSIS — M069 Rheumatoid arthritis, unspecified: Secondary | ICD-10-CM | POA: Diagnosis not present

## 2012-02-21 DIAGNOSIS — Z79899 Other long term (current) drug therapy: Secondary | ICD-10-CM | POA: Diagnosis not present

## 2012-02-21 DIAGNOSIS — R091 Pleurisy: Secondary | ICD-10-CM | POA: Diagnosis not present

## 2012-02-21 DIAGNOSIS — E119 Type 2 diabetes mellitus without complications: Secondary | ICD-10-CM | POA: Diagnosis not present

## 2012-02-24 DIAGNOSIS — I10 Essential (primary) hypertension: Secondary | ICD-10-CM | POA: Diagnosis not present

## 2012-02-24 DIAGNOSIS — N390 Urinary tract infection, site not specified: Secondary | ICD-10-CM | POA: Diagnosis not present

## 2012-02-24 DIAGNOSIS — E1165 Type 2 diabetes mellitus with hyperglycemia: Secondary | ICD-10-CM | POA: Diagnosis not present

## 2012-03-02 DIAGNOSIS — M4312 Spondylolisthesis, cervical region: Secondary | ICD-10-CM

## 2012-03-02 DIAGNOSIS — S13120A Subluxation of C1/C2 cervical vertebrae, initial encounter: Secondary | ICD-10-CM | POA: Insufficient documentation

## 2012-03-02 DIAGNOSIS — Q759 Congenital malformation of skull and face bones, unspecified: Secondary | ICD-10-CM | POA: Diagnosis not present

## 2012-03-02 DIAGNOSIS — Z853 Personal history of malignant neoplasm of breast: Secondary | ICD-10-CM | POA: Diagnosis not present

## 2012-03-02 DIAGNOSIS — M5030 Other cervical disc degeneration, unspecified cervical region: Secondary | ICD-10-CM | POA: Diagnosis not present

## 2012-03-02 DIAGNOSIS — S13121A Dislocation of C1/C2 cervical vertebrae, initial encounter: Secondary | ICD-10-CM | POA: Diagnosis not present

## 2012-03-02 DIAGNOSIS — Q7649 Other congenital malformations of spine, not associated with scoliosis: Secondary | ICD-10-CM | POA: Diagnosis not present

## 2012-03-02 DIAGNOSIS — Q758 Other specified congenital malformations of skull and face bones: Secondary | ICD-10-CM | POA: Insufficient documentation

## 2012-03-02 DIAGNOSIS — M542 Cervicalgia: Secondary | ICD-10-CM | POA: Diagnosis not present

## 2012-03-02 DIAGNOSIS — G8929 Other chronic pain: Secondary | ICD-10-CM | POA: Diagnosis not present

## 2012-03-02 DIAGNOSIS — R928 Other abnormal and inconclusive findings on diagnostic imaging of breast: Secondary | ICD-10-CM | POA: Diagnosis not present

## 2012-03-02 DIAGNOSIS — M549 Dorsalgia, unspecified: Secondary | ICD-10-CM | POA: Diagnosis not present

## 2012-03-02 HISTORY — DX: Other specified congenital malformations of skull and face bones: Q75.8

## 2012-03-02 HISTORY — DX: Subluxation of C1/c2 cervical vertebrae, initial encounter: S13.120A

## 2012-03-02 HISTORY — DX: Spondylolisthesis, cervical region: M43.12

## 2012-03-06 DIAGNOSIS — S43429A Sprain of unspecified rotator cuff capsule, initial encounter: Secondary | ICD-10-CM | POA: Diagnosis not present

## 2012-03-06 DIAGNOSIS — M069 Rheumatoid arthritis, unspecified: Secondary | ICD-10-CM | POA: Diagnosis not present

## 2012-03-08 DIAGNOSIS — M47812 Spondylosis without myelopathy or radiculopathy, cervical region: Secondary | ICD-10-CM | POA: Diagnosis not present

## 2012-03-08 DIAGNOSIS — M8448XA Pathological fracture, other site, initial encounter for fracture: Secondary | ICD-10-CM | POA: Diagnosis not present

## 2012-03-08 DIAGNOSIS — M5030 Other cervical disc degeneration, unspecified cervical region: Secondary | ICD-10-CM | POA: Diagnosis not present

## 2012-03-08 DIAGNOSIS — M069 Rheumatoid arthritis, unspecified: Secondary | ICD-10-CM | POA: Diagnosis not present

## 2012-03-08 DIAGNOSIS — E782 Mixed hyperlipidemia: Secondary | ICD-10-CM | POA: Diagnosis not present

## 2012-03-15 DIAGNOSIS — I10 Essential (primary) hypertension: Secondary | ICD-10-CM | POA: Diagnosis not present

## 2012-04-04 DIAGNOSIS — G5790 Unspecified mononeuropathy of unspecified lower limb: Secondary | ICD-10-CM | POA: Diagnosis not present

## 2012-04-04 DIAGNOSIS — M5030 Other cervical disc degeneration, unspecified cervical region: Secondary | ICD-10-CM | POA: Diagnosis not present

## 2012-04-04 DIAGNOSIS — M069 Rheumatoid arthritis, unspecified: Secondary | ICD-10-CM | POA: Diagnosis not present

## 2012-05-02 DIAGNOSIS — H04129 Dry eye syndrome of unspecified lacrimal gland: Secondary | ICD-10-CM | POA: Diagnosis not present

## 2012-05-17 DIAGNOSIS — Z79899 Other long term (current) drug therapy: Secondary | ICD-10-CM | POA: Diagnosis not present

## 2012-05-17 DIAGNOSIS — E782 Mixed hyperlipidemia: Secondary | ICD-10-CM | POA: Diagnosis not present

## 2012-06-01 DIAGNOSIS — M069 Rheumatoid arthritis, unspecified: Secondary | ICD-10-CM | POA: Diagnosis not present

## 2012-06-01 DIAGNOSIS — M542 Cervicalgia: Secondary | ICD-10-CM | POA: Diagnosis not present

## 2012-06-01 DIAGNOSIS — Q759 Congenital malformation of skull and face bones, unspecified: Secondary | ICD-10-CM | POA: Diagnosis not present

## 2012-06-01 DIAGNOSIS — M5030 Other cervical disc degeneration, unspecified cervical region: Secondary | ICD-10-CM | POA: Diagnosis not present

## 2012-06-01 DIAGNOSIS — M4310 Spondylolisthesis, site unspecified: Secondary | ICD-10-CM | POA: Diagnosis not present

## 2012-06-01 DIAGNOSIS — G8929 Other chronic pain: Secondary | ICD-10-CM | POA: Diagnosis not present

## 2012-06-01 DIAGNOSIS — S13121A Dislocation of C1/C2 cervical vertebrae, initial encounter: Secondary | ICD-10-CM | POA: Diagnosis not present

## 2012-06-01 DIAGNOSIS — M5137 Other intervertebral disc degeneration, lumbosacral region: Secondary | ICD-10-CM | POA: Diagnosis not present

## 2012-06-02 DIAGNOSIS — M069 Rheumatoid arthritis, unspecified: Secondary | ICD-10-CM | POA: Diagnosis not present

## 2012-06-02 DIAGNOSIS — M5030 Other cervical disc degeneration, unspecified cervical region: Secondary | ICD-10-CM | POA: Diagnosis not present

## 2012-06-02 DIAGNOSIS — Z79899 Other long term (current) drug therapy: Secondary | ICD-10-CM | POA: Diagnosis not present

## 2012-06-02 DIAGNOSIS — G5790 Unspecified mononeuropathy of unspecified lower limb: Secondary | ICD-10-CM | POA: Diagnosis not present

## 2012-06-05 DIAGNOSIS — M19019 Primary osteoarthritis, unspecified shoulder: Secondary | ICD-10-CM | POA: Diagnosis not present

## 2012-06-05 DIAGNOSIS — M129 Arthropathy, unspecified: Secondary | ICD-10-CM | POA: Diagnosis not present

## 2012-06-05 DIAGNOSIS — Q759 Congenital malformation of skull and face bones, unspecified: Secondary | ICD-10-CM | POA: Diagnosis not present

## 2012-06-07 DIAGNOSIS — H04129 Dry eye syndrome of unspecified lacrimal gland: Secondary | ICD-10-CM | POA: Diagnosis not present

## 2012-06-07 DIAGNOSIS — I10 Essential (primary) hypertension: Secondary | ICD-10-CM | POA: Diagnosis not present

## 2012-06-10 DIAGNOSIS — N3000 Acute cystitis without hematuria: Secondary | ICD-10-CM | POA: Diagnosis not present

## 2012-06-14 DIAGNOSIS — B029 Zoster without complications: Secondary | ICD-10-CM | POA: Diagnosis not present

## 2012-06-14 DIAGNOSIS — N3000 Acute cystitis without hematuria: Secondary | ICD-10-CM | POA: Diagnosis not present

## 2012-06-28 DIAGNOSIS — Z79899 Other long term (current) drug therapy: Secondary | ICD-10-CM | POA: Diagnosis not present

## 2012-06-28 DIAGNOSIS — M069 Rheumatoid arthritis, unspecified: Secondary | ICD-10-CM | POA: Diagnosis not present

## 2012-07-10 DIAGNOSIS — M25519 Pain in unspecified shoulder: Secondary | ICD-10-CM | POA: Diagnosis not present

## 2012-07-25 DIAGNOSIS — M25519 Pain in unspecified shoulder: Secondary | ICD-10-CM | POA: Diagnosis not present

## 2012-07-30 DIAGNOSIS — L2089 Other atopic dermatitis: Secondary | ICD-10-CM | POA: Diagnosis not present

## 2012-07-30 DIAGNOSIS — L02419 Cutaneous abscess of limb, unspecified: Secondary | ICD-10-CM | POA: Diagnosis not present

## 2012-08-02 DIAGNOSIS — Z5189 Encounter for other specified aftercare: Secondary | ICD-10-CM | POA: Diagnosis not present

## 2012-08-02 DIAGNOSIS — Q7649 Other congenital malformations of spine, not associated with scoliosis: Secondary | ICD-10-CM | POA: Diagnosis not present

## 2012-08-02 DIAGNOSIS — M5030 Other cervical disc degeneration, unspecified cervical region: Secondary | ICD-10-CM | POA: Diagnosis not present

## 2012-08-02 DIAGNOSIS — Q759 Congenital malformation of skull and face bones, unspecified: Secondary | ICD-10-CM | POA: Diagnosis not present

## 2012-08-02 DIAGNOSIS — M47812 Spondylosis without myelopathy or radiculopathy, cervical region: Secondary | ICD-10-CM | POA: Diagnosis not present

## 2012-08-02 DIAGNOSIS — M542 Cervicalgia: Secondary | ICD-10-CM | POA: Diagnosis not present

## 2012-08-02 DIAGNOSIS — M7989 Other specified soft tissue disorders: Secondary | ICD-10-CM | POA: Diagnosis not present

## 2012-08-02 DIAGNOSIS — IMO0002 Reserved for concepts with insufficient information to code with codable children: Secondary | ICD-10-CM | POA: Diagnosis not present

## 2012-08-02 DIAGNOSIS — E119 Type 2 diabetes mellitus without complications: Secondary | ICD-10-CM | POA: Diagnosis not present

## 2012-08-02 DIAGNOSIS — R259 Unspecified abnormal involuntary movements: Secondary | ICD-10-CM | POA: Diagnosis not present

## 2012-08-02 DIAGNOSIS — M25519 Pain in unspecified shoulder: Secondary | ICD-10-CM | POA: Diagnosis not present

## 2012-08-02 DIAGNOSIS — Z9889 Other specified postprocedural states: Secondary | ICD-10-CM | POA: Diagnosis not present

## 2012-08-02 DIAGNOSIS — Z794 Long term (current) use of insulin: Secondary | ICD-10-CM | POA: Diagnosis not present

## 2012-08-02 DIAGNOSIS — G8929 Other chronic pain: Secondary | ICD-10-CM | POA: Diagnosis not present

## 2012-08-04 DIAGNOSIS — Z79899 Other long term (current) drug therapy: Secondary | ICD-10-CM | POA: Diagnosis not present

## 2012-08-04 DIAGNOSIS — S81809A Unspecified open wound, unspecified lower leg, initial encounter: Secondary | ICD-10-CM | POA: Diagnosis not present

## 2012-08-04 DIAGNOSIS — E782 Mixed hyperlipidemia: Secondary | ICD-10-CM | POA: Diagnosis not present

## 2012-08-04 DIAGNOSIS — S81009A Unspecified open wound, unspecified knee, initial encounter: Secondary | ICD-10-CM | POA: Diagnosis not present

## 2012-08-10 DIAGNOSIS — I83009 Varicose veins of unspecified lower extremity with ulcer of unspecified site: Secondary | ICD-10-CM | POA: Diagnosis not present

## 2012-08-10 DIAGNOSIS — I10 Essential (primary) hypertension: Secondary | ICD-10-CM | POA: Diagnosis not present

## 2012-08-10 DIAGNOSIS — I872 Venous insufficiency (chronic) (peripheral): Secondary | ICD-10-CM | POA: Diagnosis not present

## 2012-08-10 DIAGNOSIS — L97909 Non-pressure chronic ulcer of unspecified part of unspecified lower leg with unspecified severity: Secondary | ICD-10-CM | POA: Diagnosis not present

## 2012-08-21 DIAGNOSIS — Z794 Long term (current) use of insulin: Secondary | ICD-10-CM | POA: Diagnosis not present

## 2012-08-21 DIAGNOSIS — I10 Essential (primary) hypertension: Secondary | ICD-10-CM | POA: Diagnosis not present

## 2012-08-21 DIAGNOSIS — E039 Hypothyroidism, unspecified: Secondary | ICD-10-CM | POA: Diagnosis not present

## 2012-08-21 DIAGNOSIS — E785 Hyperlipidemia, unspecified: Secondary | ICD-10-CM | POA: Diagnosis not present

## 2012-08-21 DIAGNOSIS — E119 Type 2 diabetes mellitus without complications: Secondary | ICD-10-CM | POA: Diagnosis not present

## 2012-08-29 DIAGNOSIS — M25519 Pain in unspecified shoulder: Secondary | ICD-10-CM | POA: Diagnosis not present

## 2012-08-31 DIAGNOSIS — E039 Hypothyroidism, unspecified: Secondary | ICD-10-CM | POA: Diagnosis not present

## 2012-08-31 DIAGNOSIS — E119 Type 2 diabetes mellitus without complications: Secondary | ICD-10-CM | POA: Diagnosis not present

## 2012-08-31 DIAGNOSIS — K219 Gastro-esophageal reflux disease without esophagitis: Secondary | ICD-10-CM | POA: Diagnosis not present

## 2012-08-31 DIAGNOSIS — M542 Cervicalgia: Secondary | ICD-10-CM | POA: Diagnosis not present

## 2012-08-31 DIAGNOSIS — M25519 Pain in unspecified shoulder: Secondary | ICD-10-CM | POA: Diagnosis not present

## 2012-08-31 DIAGNOSIS — M545 Low back pain, unspecified: Secondary | ICD-10-CM | POA: Diagnosis not present

## 2012-08-31 DIAGNOSIS — G5600 Carpal tunnel syndrome, unspecified upper limb: Secondary | ICD-10-CM | POA: Diagnosis not present

## 2012-08-31 DIAGNOSIS — M069 Rheumatoid arthritis, unspecified: Secondary | ICD-10-CM | POA: Diagnosis not present

## 2012-08-31 DIAGNOSIS — Z79899 Other long term (current) drug therapy: Secondary | ICD-10-CM | POA: Diagnosis not present

## 2012-08-31 DIAGNOSIS — G5620 Lesion of ulnar nerve, unspecified upper limb: Secondary | ICD-10-CM | POA: Diagnosis not present

## 2012-08-31 DIAGNOSIS — I872 Venous insufficiency (chronic) (peripheral): Secondary | ICD-10-CM | POA: Diagnosis not present

## 2012-09-04 DIAGNOSIS — M069 Rheumatoid arthritis, unspecified: Secondary | ICD-10-CM | POA: Diagnosis not present

## 2012-09-04 DIAGNOSIS — Q759 Congenital malformation of skull and face bones, unspecified: Secondary | ICD-10-CM | POA: Diagnosis not present

## 2012-09-04 DIAGNOSIS — S43429A Sprain of unspecified rotator cuff capsule, initial encounter: Secondary | ICD-10-CM | POA: Diagnosis not present

## 2012-09-04 DIAGNOSIS — E119 Type 2 diabetes mellitus without complications: Secondary | ICD-10-CM | POA: Diagnosis not present

## 2012-09-04 DIAGNOSIS — B028 Zoster with other complications: Secondary | ICD-10-CM | POA: Diagnosis not present

## 2012-09-04 DIAGNOSIS — Z794 Long term (current) use of insulin: Secondary | ICD-10-CM | POA: Diagnosis not present

## 2012-09-04 DIAGNOSIS — S13121A Dislocation of C1/C2 cervical vertebrae, initial encounter: Secondary | ICD-10-CM | POA: Diagnosis not present

## 2012-09-04 DIAGNOSIS — IMO0002 Reserved for concepts with insufficient information to code with codable children: Secondary | ICD-10-CM | POA: Diagnosis not present

## 2012-09-04 DIAGNOSIS — M25449 Effusion, unspecified hand: Secondary | ICD-10-CM | POA: Diagnosis not present

## 2012-09-21 DIAGNOSIS — Z01419 Encounter for gynecological examination (general) (routine) without abnormal findings: Secondary | ICD-10-CM | POA: Diagnosis not present

## 2012-09-26 DIAGNOSIS — M069 Rheumatoid arthritis, unspecified: Secondary | ICD-10-CM | POA: Diagnosis not present

## 2012-10-02 DIAGNOSIS — D649 Anemia, unspecified: Secondary | ICD-10-CM | POA: Diagnosis not present

## 2012-10-02 DIAGNOSIS — R809 Proteinuria, unspecified: Secondary | ICD-10-CM | POA: Diagnosis not present

## 2012-10-02 DIAGNOSIS — R609 Edema, unspecified: Secondary | ICD-10-CM | POA: Diagnosis not present

## 2012-10-02 DIAGNOSIS — I129 Hypertensive chronic kidney disease with stage 1 through stage 4 chronic kidney disease, or unspecified chronic kidney disease: Secondary | ICD-10-CM | POA: Diagnosis not present

## 2012-10-02 DIAGNOSIS — E119 Type 2 diabetes mellitus without complications: Secondary | ICD-10-CM | POA: Diagnosis not present

## 2012-10-02 DIAGNOSIS — M069 Rheumatoid arthritis, unspecified: Secondary | ICD-10-CM | POA: Diagnosis not present

## 2012-10-10 DIAGNOSIS — R928 Other abnormal and inconclusive findings on diagnostic imaging of breast: Secondary | ICD-10-CM | POA: Diagnosis not present

## 2012-10-23 DIAGNOSIS — N390 Urinary tract infection, site not specified: Secondary | ICD-10-CM | POA: Diagnosis not present

## 2012-10-24 DIAGNOSIS — E119 Type 2 diabetes mellitus without complications: Secondary | ICD-10-CM | POA: Diagnosis not present

## 2012-10-24 DIAGNOSIS — M069 Rheumatoid arthritis, unspecified: Secondary | ICD-10-CM | POA: Diagnosis not present

## 2012-10-24 DIAGNOSIS — R920 Mammographic microcalcification found on diagnostic imaging of breast: Secondary | ICD-10-CM | POA: Diagnosis not present

## 2012-10-24 DIAGNOSIS — I10 Essential (primary) hypertension: Secondary | ICD-10-CM | POA: Diagnosis not present

## 2012-10-27 DIAGNOSIS — M25519 Pain in unspecified shoulder: Secondary | ICD-10-CM | POA: Diagnosis not present

## 2012-11-01 DIAGNOSIS — M5030 Other cervical disc degeneration, unspecified cervical region: Secondary | ICD-10-CM | POA: Diagnosis not present

## 2012-11-01 DIAGNOSIS — M4802 Spinal stenosis, cervical region: Secondary | ICD-10-CM | POA: Diagnosis not present

## 2012-11-01 DIAGNOSIS — Z5189 Encounter for other specified aftercare: Secondary | ICD-10-CM | POA: Diagnosis not present

## 2012-11-01 DIAGNOSIS — Q759 Congenital malformation of skull and face bones, unspecified: Secondary | ICD-10-CM | POA: Diagnosis not present

## 2012-11-01 DIAGNOSIS — Q7649 Other congenital malformations of spine, not associated with scoliosis: Secondary | ICD-10-CM | POA: Diagnosis not present

## 2012-11-01 DIAGNOSIS — M069 Rheumatoid arthritis, unspecified: Secondary | ICD-10-CM | POA: Diagnosis not present

## 2012-11-02 DIAGNOSIS — E119 Type 2 diabetes mellitus without complications: Secondary | ICD-10-CM | POA: Diagnosis not present

## 2012-11-02 DIAGNOSIS — Z79899 Other long term (current) drug therapy: Secondary | ICD-10-CM | POA: Diagnosis not present

## 2012-11-02 DIAGNOSIS — D649 Anemia, unspecified: Secondary | ICD-10-CM | POA: Diagnosis not present

## 2012-11-02 DIAGNOSIS — M810 Age-related osteoporosis without current pathological fracture: Secondary | ICD-10-CM | POA: Diagnosis not present

## 2012-11-02 DIAGNOSIS — R609 Edema, unspecified: Secondary | ICD-10-CM | POA: Diagnosis not present

## 2012-11-02 DIAGNOSIS — R809 Proteinuria, unspecified: Secondary | ICD-10-CM | POA: Diagnosis not present

## 2012-11-02 DIAGNOSIS — Z13820 Encounter for screening for osteoporosis: Secondary | ICD-10-CM | POA: Diagnosis not present

## 2012-11-02 DIAGNOSIS — M069 Rheumatoid arthritis, unspecified: Secondary | ICD-10-CM | POA: Diagnosis not present

## 2012-11-02 DIAGNOSIS — I129 Hypertensive chronic kidney disease with stage 1 through stage 4 chronic kidney disease, or unspecified chronic kidney disease: Secondary | ICD-10-CM | POA: Diagnosis not present

## 2012-11-21 DIAGNOSIS — M899 Disorder of bone, unspecified: Secondary | ICD-10-CM | POA: Diagnosis not present

## 2012-11-21 DIAGNOSIS — M949 Disorder of cartilage, unspecified: Secondary | ICD-10-CM | POA: Diagnosis not present

## 2012-11-21 DIAGNOSIS — R928 Other abnormal and inconclusive findings on diagnostic imaging of breast: Secondary | ICD-10-CM | POA: Insufficient documentation

## 2012-11-21 DIAGNOSIS — Q7649 Other congenital malformations of spine, not associated with scoliosis: Secondary | ICD-10-CM | POA: Diagnosis not present

## 2012-11-21 DIAGNOSIS — M810 Age-related osteoporosis without current pathological fracture: Secondary | ICD-10-CM | POA: Diagnosis not present

## 2012-11-21 DIAGNOSIS — M069 Rheumatoid arthritis, unspecified: Secondary | ICD-10-CM | POA: Diagnosis not present

## 2012-12-19 DIAGNOSIS — R928 Other abnormal and inconclusive findings on diagnostic imaging of breast: Secondary | ICD-10-CM | POA: Diagnosis not present

## 2012-12-20 DIAGNOSIS — E782 Mixed hyperlipidemia: Secondary | ICD-10-CM | POA: Diagnosis not present

## 2012-12-27 DIAGNOSIS — Z01810 Encounter for preprocedural cardiovascular examination: Secondary | ICD-10-CM | POA: Diagnosis not present

## 2012-12-27 DIAGNOSIS — I498 Other specified cardiac arrhythmias: Secondary | ICD-10-CM | POA: Diagnosis not present

## 2012-12-27 DIAGNOSIS — R928 Other abnormal and inconclusive findings on diagnostic imaging of breast: Secondary | ICD-10-CM | POA: Diagnosis not present

## 2012-12-27 DIAGNOSIS — I272 Pulmonary hypertension, unspecified: Secondary | ICD-10-CM | POA: Insufficient documentation

## 2012-12-27 HISTORY — DX: Pulmonary hypertension, unspecified: I27.20

## 2012-12-28 DIAGNOSIS — Z0000 Encounter for general adult medical examination without abnormal findings: Secondary | ICD-10-CM | POA: Diagnosis not present

## 2012-12-28 DIAGNOSIS — E119 Type 2 diabetes mellitus without complications: Secondary | ICD-10-CM | POA: Diagnosis not present

## 2012-12-31 DIAGNOSIS — I10 Essential (primary) hypertension: Secondary | ICD-10-CM | POA: Diagnosis not present

## 2012-12-31 DIAGNOSIS — J90 Pleural effusion, not elsewhere classified: Secondary | ICD-10-CM | POA: Diagnosis not present

## 2012-12-31 DIAGNOSIS — R1013 Epigastric pain: Secondary | ICD-10-CM | POA: Diagnosis not present

## 2012-12-31 DIAGNOSIS — R109 Unspecified abdominal pain: Secondary | ICD-10-CM | POA: Diagnosis not present

## 2012-12-31 DIAGNOSIS — M069 Rheumatoid arthritis, unspecified: Secondary | ICD-10-CM | POA: Diagnosis not present

## 2012-12-31 DIAGNOSIS — K5900 Constipation, unspecified: Secondary | ICD-10-CM | POA: Diagnosis not present

## 2012-12-31 DIAGNOSIS — N390 Urinary tract infection, site not specified: Secondary | ICD-10-CM | POA: Diagnosis not present

## 2012-12-31 DIAGNOSIS — K56609 Unspecified intestinal obstruction, unspecified as to partial versus complete obstruction: Secondary | ICD-10-CM | POA: Diagnosis not present

## 2012-12-31 DIAGNOSIS — R110 Nausea: Secondary | ICD-10-CM | POA: Diagnosis not present

## 2012-12-31 DIAGNOSIS — E785 Hyperlipidemia, unspecified: Secondary | ICD-10-CM | POA: Diagnosis not present

## 2013-01-01 DIAGNOSIS — J90 Pleural effusion, not elsewhere classified: Secondary | ICD-10-CM | POA: Diagnosis not present

## 2013-01-08 DIAGNOSIS — K5900 Constipation, unspecified: Secondary | ICD-10-CM | POA: Diagnosis not present

## 2013-01-23 DIAGNOSIS — R928 Other abnormal and inconclusive findings on diagnostic imaging of breast: Secondary | ICD-10-CM | POA: Diagnosis not present

## 2013-01-29 DIAGNOSIS — E119 Type 2 diabetes mellitus without complications: Secondary | ICD-10-CM | POA: Diagnosis not present

## 2013-01-29 DIAGNOSIS — Z23 Encounter for immunization: Secondary | ICD-10-CM | POA: Diagnosis not present

## 2013-01-31 DIAGNOSIS — M25519 Pain in unspecified shoulder: Secondary | ICD-10-CM | POA: Diagnosis not present

## 2013-01-31 DIAGNOSIS — G5790 Unspecified mononeuropathy of unspecified lower limb: Secondary | ICD-10-CM | POA: Diagnosis not present

## 2013-01-31 DIAGNOSIS — M069 Rheumatoid arthritis, unspecified: Secondary | ICD-10-CM | POA: Diagnosis not present

## 2013-02-08 DIAGNOSIS — M25519 Pain in unspecified shoulder: Secondary | ICD-10-CM | POA: Diagnosis not present

## 2013-02-08 DIAGNOSIS — Z8719 Personal history of other diseases of the digestive system: Secondary | ICD-10-CM | POA: Diagnosis not present

## 2013-02-08 DIAGNOSIS — IMO0002 Reserved for concepts with insufficient information to code with codable children: Secondary | ICD-10-CM | POA: Diagnosis not present

## 2013-02-08 DIAGNOSIS — J387 Other diseases of larynx: Secondary | ICD-10-CM | POA: Diagnosis not present

## 2013-02-08 DIAGNOSIS — E119 Type 2 diabetes mellitus without complications: Secondary | ICD-10-CM | POA: Diagnosis not present

## 2013-02-08 DIAGNOSIS — J869 Pyothorax without fistula: Secondary | ICD-10-CM | POA: Diagnosis not present

## 2013-02-08 DIAGNOSIS — M069 Rheumatoid arthritis, unspecified: Secondary | ICD-10-CM | POA: Diagnosis not present

## 2013-02-08 DIAGNOSIS — Z79899 Other long term (current) drug therapy: Secondary | ICD-10-CM | POA: Diagnosis not present

## 2013-02-08 DIAGNOSIS — I872 Venous insufficiency (chronic) (peripheral): Secondary | ICD-10-CM | POA: Diagnosis not present

## 2013-02-08 DIAGNOSIS — M8580 Other specified disorders of bone density and structure, unspecified site: Secondary | ICD-10-CM

## 2013-02-08 DIAGNOSIS — R609 Edema, unspecified: Secondary | ICD-10-CM | POA: Diagnosis not present

## 2013-02-08 DIAGNOSIS — Z9889 Other specified postprocedural states: Secondary | ICD-10-CM | POA: Diagnosis not present

## 2013-02-08 DIAGNOSIS — M899 Disorder of bone, unspecified: Secondary | ICD-10-CM | POA: Diagnosis not present

## 2013-02-08 DIAGNOSIS — G5620 Lesion of ulnar nerve, unspecified upper limb: Secondary | ICD-10-CM | POA: Diagnosis not present

## 2013-02-08 HISTORY — DX: Other specified disorders of bone density and structure, unspecified site: M85.80

## 2013-02-12 DIAGNOSIS — N183 Chronic kidney disease, stage 3 unspecified: Secondary | ICD-10-CM | POA: Diagnosis not present

## 2013-02-12 DIAGNOSIS — R809 Proteinuria, unspecified: Secondary | ICD-10-CM | POA: Diagnosis not present

## 2013-02-12 DIAGNOSIS — I129 Hypertensive chronic kidney disease with stage 1 through stage 4 chronic kidney disease, or unspecified chronic kidney disease: Secondary | ICD-10-CM | POA: Diagnosis not present

## 2013-02-12 HISTORY — DX: Proteinuria, unspecified: R80.9

## 2013-02-21 DIAGNOSIS — E559 Vitamin D deficiency, unspecified: Secondary | ICD-10-CM

## 2013-02-21 DIAGNOSIS — N390 Urinary tract infection, site not specified: Secondary | ICD-10-CM | POA: Diagnosis not present

## 2013-02-21 HISTORY — DX: Vitamin D deficiency, unspecified: E55.9

## 2013-03-01 DIAGNOSIS — M4310 Spondylolisthesis, site unspecified: Secondary | ICD-10-CM | POA: Diagnosis not present

## 2013-03-01 DIAGNOSIS — Z5189 Encounter for other specified aftercare: Secondary | ICD-10-CM | POA: Diagnosis not present

## 2013-03-01 DIAGNOSIS — M4802 Spinal stenosis, cervical region: Secondary | ICD-10-CM | POA: Diagnosis not present

## 2013-03-01 DIAGNOSIS — Q759 Congenital malformation of skull and face bones, unspecified: Secondary | ICD-10-CM | POA: Diagnosis not present

## 2013-03-02 DIAGNOSIS — N3000 Acute cystitis without hematuria: Secondary | ICD-10-CM | POA: Diagnosis not present

## 2013-03-21 DIAGNOSIS — Z79899 Other long term (current) drug therapy: Secondary | ICD-10-CM | POA: Diagnosis not present

## 2013-03-21 DIAGNOSIS — E119 Type 2 diabetes mellitus without complications: Secondary | ICD-10-CM | POA: Diagnosis not present

## 2013-04-03 DIAGNOSIS — E039 Hypothyroidism, unspecified: Secondary | ICD-10-CM | POA: Diagnosis not present

## 2013-04-03 DIAGNOSIS — I10 Essential (primary) hypertension: Secondary | ICD-10-CM | POA: Diagnosis not present

## 2013-04-03 DIAGNOSIS — Z006 Encounter for examination for normal comparison and control in clinical research program: Secondary | ICD-10-CM | POA: Diagnosis not present

## 2013-04-28 DIAGNOSIS — M25559 Pain in unspecified hip: Secondary | ICD-10-CM | POA: Diagnosis not present

## 2013-04-28 DIAGNOSIS — M169 Osteoarthritis of hip, unspecified: Secondary | ICD-10-CM | POA: Diagnosis not present

## 2013-04-28 DIAGNOSIS — Z794 Long term (current) use of insulin: Secondary | ICD-10-CM | POA: Diagnosis not present

## 2013-04-28 DIAGNOSIS — IMO0002 Reserved for concepts with insufficient information to code with codable children: Secondary | ICD-10-CM | POA: Diagnosis not present

## 2013-04-28 DIAGNOSIS — N390 Urinary tract infection, site not specified: Secondary | ICD-10-CM | POA: Diagnosis not present

## 2013-04-28 DIAGNOSIS — R52 Pain, unspecified: Secondary | ICD-10-CM | POA: Diagnosis not present

## 2013-04-28 DIAGNOSIS — N949 Unspecified condition associated with female genital organs and menstrual cycle: Secondary | ICD-10-CM | POA: Diagnosis not present

## 2013-04-28 DIAGNOSIS — E119 Type 2 diabetes mellitus without complications: Secondary | ICD-10-CM | POA: Diagnosis not present

## 2013-04-28 DIAGNOSIS — Z79899 Other long term (current) drug therapy: Secondary | ICD-10-CM | POA: Diagnosis not present

## 2013-04-28 DIAGNOSIS — R109 Unspecified abdominal pain: Secondary | ICD-10-CM | POA: Diagnosis not present

## 2013-05-01 DIAGNOSIS — M069 Rheumatoid arthritis, unspecified: Secondary | ICD-10-CM | POA: Diagnosis not present

## 2013-05-23 DIAGNOSIS — IMO0001 Reserved for inherently not codable concepts without codable children: Secondary | ICD-10-CM | POA: Diagnosis not present

## 2013-05-29 DIAGNOSIS — R928 Other abnormal and inconclusive findings on diagnostic imaging of breast: Secondary | ICD-10-CM | POA: Diagnosis not present

## 2013-05-29 DIAGNOSIS — M069 Rheumatoid arthritis, unspecified: Secondary | ICD-10-CM | POA: Diagnosis not present

## 2013-07-11 DIAGNOSIS — IMO0001 Reserved for inherently not codable concepts without codable children: Secondary | ICD-10-CM | POA: Diagnosis not present

## 2013-07-11 DIAGNOSIS — E782 Mixed hyperlipidemia: Secondary | ICD-10-CM | POA: Diagnosis not present

## 2013-07-11 DIAGNOSIS — Z79899 Other long term (current) drug therapy: Secondary | ICD-10-CM | POA: Diagnosis not present

## 2013-07-16 DIAGNOSIS — M069 Rheumatoid arthritis, unspecified: Secondary | ICD-10-CM | POA: Diagnosis not present

## 2013-07-16 DIAGNOSIS — E559 Vitamin D deficiency, unspecified: Secondary | ICD-10-CM | POA: Diagnosis not present

## 2013-07-16 DIAGNOSIS — M899 Disorder of bone, unspecified: Secondary | ICD-10-CM | POA: Diagnosis not present

## 2013-07-16 DIAGNOSIS — Z79899 Other long term (current) drug therapy: Secondary | ICD-10-CM | POA: Diagnosis not present

## 2013-07-18 DIAGNOSIS — I10 Essential (primary) hypertension: Secondary | ICD-10-CM | POA: Diagnosis not present

## 2013-07-18 DIAGNOSIS — IMO0001 Reserved for inherently not codable concepts without codable children: Secondary | ICD-10-CM | POA: Diagnosis not present

## 2013-07-19 DIAGNOSIS — M4802 Spinal stenosis, cervical region: Secondary | ICD-10-CM | POA: Diagnosis not present

## 2013-07-19 DIAGNOSIS — Z4789 Encounter for other orthopedic aftercare: Secondary | ICD-10-CM | POA: Diagnosis not present

## 2013-07-19 DIAGNOSIS — M4310 Spondylolisthesis, site unspecified: Secondary | ICD-10-CM | POA: Diagnosis not present

## 2013-07-19 DIAGNOSIS — Q759 Congenital malformation of skull and face bones, unspecified: Secondary | ICD-10-CM | POA: Diagnosis not present

## 2013-07-23 DIAGNOSIS — M069 Rheumatoid arthritis, unspecified: Secondary | ICD-10-CM | POA: Diagnosis not present

## 2013-07-25 DIAGNOSIS — IMO0001 Reserved for inherently not codable concepts without codable children: Secondary | ICD-10-CM

## 2013-07-25 DIAGNOSIS — K449 Diaphragmatic hernia without obstruction or gangrene: Secondary | ICD-10-CM

## 2013-07-25 DIAGNOSIS — I10 Essential (primary) hypertension: Secondary | ICD-10-CM | POA: Insufficient documentation

## 2013-07-25 DIAGNOSIS — M069 Rheumatoid arthritis, unspecified: Secondary | ICD-10-CM | POA: Diagnosis not present

## 2013-07-25 DIAGNOSIS — G5790 Unspecified mononeuropathy of unspecified lower limb: Secondary | ICD-10-CM | POA: Insufficient documentation

## 2013-07-25 DIAGNOSIS — L98499 Non-pressure chronic ulcer of skin of other sites with unspecified severity: Secondary | ICD-10-CM

## 2013-07-25 DIAGNOSIS — M5030 Other cervical disc degeneration, unspecified cervical region: Secondary | ICD-10-CM | POA: Insufficient documentation

## 2013-07-25 DIAGNOSIS — M25519 Pain in unspecified shoulder: Secondary | ICD-10-CM | POA: Insufficient documentation

## 2013-07-25 DIAGNOSIS — E119 Type 2 diabetes mellitus without complications: Secondary | ICD-10-CM | POA: Insufficient documentation

## 2013-07-25 HISTORY — DX: Other cervical disc degeneration, unspecified cervical region: M50.30

## 2013-07-25 HISTORY — DX: Pain in unspecified shoulder: M25.519

## 2013-07-25 HISTORY — DX: Reserved for inherently not codable concepts without codable children: IMO0001

## 2013-07-25 HISTORY — DX: Unspecified mononeuropathy of unspecified lower limb: G57.90

## 2013-07-25 HISTORY — DX: Type 2 diabetes mellitus without complications: E11.9

## 2013-07-25 HISTORY — DX: Diaphragmatic hernia without obstruction or gangrene: K44.9

## 2013-08-01 DIAGNOSIS — N3000 Acute cystitis without hematuria: Secondary | ICD-10-CM | POA: Diagnosis not present

## 2013-08-24 DIAGNOSIS — E559 Vitamin D deficiency, unspecified: Secondary | ICD-10-CM | POA: Diagnosis not present

## 2013-10-10 DIAGNOSIS — Z961 Presence of intraocular lens: Secondary | ICD-10-CM

## 2013-10-10 DIAGNOSIS — H2589 Other age-related cataract: Secondary | ICD-10-CM | POA: Diagnosis not present

## 2013-10-10 DIAGNOSIS — Z9849 Cataract extraction status, unspecified eye: Secondary | ICD-10-CM | POA: Diagnosis not present

## 2013-10-10 DIAGNOSIS — H16223 Keratoconjunctivitis sicca, not specified as Sjogren's, bilateral: Secondary | ICD-10-CM

## 2013-10-10 DIAGNOSIS — H168 Other keratitis: Secondary | ICD-10-CM | POA: Diagnosis not present

## 2013-10-10 DIAGNOSIS — H25812 Combined forms of age-related cataract, left eye: Secondary | ICD-10-CM

## 2013-10-10 DIAGNOSIS — M3501 Sicca syndrome with keratoconjunctivitis: Secondary | ICD-10-CM

## 2013-10-10 DIAGNOSIS — Z9841 Cataract extraction status, right eye: Secondary | ICD-10-CM

## 2013-10-10 DIAGNOSIS — E119 Type 2 diabetes mellitus without complications: Secondary | ICD-10-CM | POA: Diagnosis not present

## 2013-10-10 HISTORY — DX: Presence of intraocular lens: Z96.1

## 2013-10-10 HISTORY — DX: Keratoconjunctivitis sicca, not specified as Sjogren's, bilateral: H16.223

## 2013-10-10 HISTORY — DX: Combined forms of age-related cataract, left eye: H25.812

## 2013-10-10 HISTORY — DX: Cataract extraction status, right eye: Z98.41

## 2013-10-10 HISTORY — DX: Sjogren syndrome with keratoconjunctivitis: M35.01

## 2013-10-24 DIAGNOSIS — G5790 Unspecified mononeuropathy of unspecified lower limb: Secondary | ICD-10-CM | POA: Diagnosis not present

## 2013-10-24 DIAGNOSIS — R300 Dysuria: Secondary | ICD-10-CM | POA: Diagnosis not present

## 2013-10-24 DIAGNOSIS — M5030 Other cervical disc degeneration, unspecified cervical region: Secondary | ICD-10-CM | POA: Diagnosis not present

## 2013-10-24 DIAGNOSIS — M25519 Pain in unspecified shoulder: Secondary | ICD-10-CM | POA: Diagnosis not present

## 2013-10-24 DIAGNOSIS — N3000 Acute cystitis without hematuria: Secondary | ICD-10-CM | POA: Diagnosis not present

## 2013-10-30 DIAGNOSIS — R928 Other abnormal and inconclusive findings on diagnostic imaging of breast: Secondary | ICD-10-CM | POA: Diagnosis not present

## 2013-10-30 DIAGNOSIS — R920 Mammographic microcalcification found on diagnostic imaging of breast: Secondary | ICD-10-CM | POA: Diagnosis not present

## 2013-11-01 DIAGNOSIS — IMO0001 Reserved for inherently not codable concepts without codable children: Secondary | ICD-10-CM | POA: Diagnosis not present

## 2013-11-14 DIAGNOSIS — IMO0001 Reserved for inherently not codable concepts without codable children: Secondary | ICD-10-CM | POA: Diagnosis not present

## 2013-11-26 DIAGNOSIS — N183 Chronic kidney disease, stage 3 unspecified: Secondary | ICD-10-CM | POA: Diagnosis not present

## 2013-11-26 DIAGNOSIS — R809 Proteinuria, unspecified: Secondary | ICD-10-CM | POA: Diagnosis not present

## 2013-11-26 DIAGNOSIS — E119 Type 2 diabetes mellitus without complications: Secondary | ICD-10-CM | POA: Diagnosis not present

## 2013-11-26 DIAGNOSIS — Z794 Long term (current) use of insulin: Secondary | ICD-10-CM | POA: Diagnosis not present

## 2013-11-26 DIAGNOSIS — I129 Hypertensive chronic kidney disease with stage 1 through stage 4 chronic kidney disease, or unspecified chronic kidney disease: Secondary | ICD-10-CM | POA: Diagnosis not present

## 2013-11-26 DIAGNOSIS — I10 Essential (primary) hypertension: Secondary | ICD-10-CM | POA: Diagnosis not present

## 2013-11-30 DIAGNOSIS — N3000 Acute cystitis without hematuria: Secondary | ICD-10-CM | POA: Diagnosis not present

## 2013-12-17 DIAGNOSIS — K219 Gastro-esophageal reflux disease without esophagitis: Secondary | ICD-10-CM | POA: Diagnosis not present

## 2013-12-17 DIAGNOSIS — Z79899 Other long term (current) drug therapy: Secondary | ICD-10-CM | POA: Diagnosis not present

## 2013-12-17 DIAGNOSIS — R609 Edema, unspecified: Secondary | ICD-10-CM | POA: Diagnosis not present

## 2013-12-17 DIAGNOSIS — IMO0002 Reserved for concepts with insufficient information to code with codable children: Secondary | ICD-10-CM | POA: Diagnosis not present

## 2013-12-17 DIAGNOSIS — M949 Disorder of cartilage, unspecified: Secondary | ICD-10-CM | POA: Diagnosis not present

## 2013-12-17 DIAGNOSIS — I129 Hypertensive chronic kidney disease with stage 1 through stage 4 chronic kidney disease, or unspecified chronic kidney disease: Secondary | ICD-10-CM | POA: Diagnosis not present

## 2013-12-17 DIAGNOSIS — M069 Rheumatoid arthritis, unspecified: Secondary | ICD-10-CM | POA: Diagnosis not present

## 2013-12-17 DIAGNOSIS — N183 Chronic kidney disease, stage 3 unspecified: Secondary | ICD-10-CM | POA: Diagnosis not present

## 2013-12-17 DIAGNOSIS — G5690 Unspecified mononeuropathy of unspecified upper limb: Secondary | ICD-10-CM | POA: Diagnosis not present

## 2013-12-17 DIAGNOSIS — Z8711 Personal history of peptic ulcer disease: Secondary | ICD-10-CM | POA: Diagnosis not present

## 2013-12-17 DIAGNOSIS — E119 Type 2 diabetes mellitus without complications: Secondary | ICD-10-CM | POA: Diagnosis not present

## 2013-12-17 DIAGNOSIS — I872 Venous insufficiency (chronic) (peripheral): Secondary | ICD-10-CM | POA: Diagnosis not present

## 2013-12-17 DIAGNOSIS — M899 Disorder of bone, unspecified: Secondary | ICD-10-CM | POA: Diagnosis not present

## 2014-01-26 DIAGNOSIS — K5900 Constipation, unspecified: Secondary | ICD-10-CM | POA: Diagnosis not present

## 2014-01-30 DIAGNOSIS — M47812 Spondylosis without myelopathy or radiculopathy, cervical region: Secondary | ICD-10-CM | POA: Diagnosis not present

## 2014-01-30 DIAGNOSIS — M5020 Other cervical disc displacement, unspecified cervical region: Secondary | ICD-10-CM | POA: Diagnosis not present

## 2014-01-31 DIAGNOSIS — M5030 Other cervical disc degeneration, unspecified cervical region: Secondary | ICD-10-CM | POA: Diagnosis not present

## 2014-01-31 DIAGNOSIS — I10 Essential (primary) hypertension: Secondary | ICD-10-CM | POA: Diagnosis not present

## 2014-01-31 DIAGNOSIS — M069 Rheumatoid arthritis, unspecified: Secondary | ICD-10-CM | POA: Diagnosis not present

## 2014-01-31 DIAGNOSIS — Z79899 Other long term (current) drug therapy: Secondary | ICD-10-CM | POA: Diagnosis not present

## 2014-01-31 DIAGNOSIS — E039 Hypothyroidism, unspecified: Secondary | ICD-10-CM | POA: Diagnosis not present

## 2014-01-31 DIAGNOSIS — Z886 Allergy status to analgesic agent status: Secondary | ICD-10-CM | POA: Diagnosis not present

## 2014-01-31 DIAGNOSIS — E119 Type 2 diabetes mellitus without complications: Secondary | ICD-10-CM | POA: Diagnosis not present

## 2014-01-31 DIAGNOSIS — M3500 Sicca syndrome, unspecified: Secondary | ICD-10-CM | POA: Diagnosis not present

## 2014-01-31 DIAGNOSIS — Z7952 Long term (current) use of systemic steroids: Secondary | ICD-10-CM | POA: Diagnosis not present

## 2014-01-31 DIAGNOSIS — M4312 Spondylolisthesis, cervical region: Secondary | ICD-10-CM | POA: Diagnosis not present

## 2014-01-31 DIAGNOSIS — M542 Cervicalgia: Secondary | ICD-10-CM | POA: Diagnosis not present

## 2014-01-31 DIAGNOSIS — E785 Hyperlipidemia, unspecified: Secondary | ICD-10-CM | POA: Diagnosis not present

## 2014-01-31 DIAGNOSIS — Z794 Long term (current) use of insulin: Secondary | ICD-10-CM | POA: Diagnosis not present

## 2014-01-31 DIAGNOSIS — Z91040 Latex allergy status: Secondary | ICD-10-CM | POA: Diagnosis not present

## 2014-01-31 DIAGNOSIS — Z882 Allergy status to sulfonamides status: Secondary | ICD-10-CM | POA: Diagnosis not present

## 2014-02-01 DIAGNOSIS — M5030 Other cervical disc degeneration, unspecified cervical region: Secondary | ICD-10-CM | POA: Diagnosis not present

## 2014-02-01 DIAGNOSIS — M069 Rheumatoid arthritis, unspecified: Secondary | ICD-10-CM | POA: Diagnosis not present

## 2014-02-07 DIAGNOSIS — Z23 Encounter for immunization: Secondary | ICD-10-CM | POA: Diagnosis not present

## 2014-02-11 DIAGNOSIS — N183 Chronic kidney disease, stage 3 (moderate): Secondary | ICD-10-CM | POA: Diagnosis not present

## 2014-02-11 DIAGNOSIS — E039 Hypothyroidism, unspecified: Secondary | ICD-10-CM | POA: Diagnosis not present

## 2014-02-11 DIAGNOSIS — E1165 Type 2 diabetes mellitus with hyperglycemia: Secondary | ICD-10-CM | POA: Diagnosis not present

## 2014-02-11 DIAGNOSIS — E1129 Type 2 diabetes mellitus with other diabetic kidney complication: Secondary | ICD-10-CM | POA: Diagnosis not present

## 2014-02-14 DIAGNOSIS — M434 Other recurrent atlantoaxial dislocation: Secondary | ICD-10-CM | POA: Diagnosis not present

## 2014-02-14 DIAGNOSIS — I10 Essential (primary) hypertension: Secondary | ICD-10-CM | POA: Diagnosis not present

## 2014-02-14 DIAGNOSIS — E119 Type 2 diabetes mellitus without complications: Secondary | ICD-10-CM | POA: Insufficient documentation

## 2014-02-14 DIAGNOSIS — Q7649 Other congenital malformations of spine, not associated with scoliosis: Secondary | ICD-10-CM | POA: Diagnosis not present

## 2014-02-14 DIAGNOSIS — E039 Hypothyroidism, unspecified: Secondary | ICD-10-CM | POA: Diagnosis not present

## 2014-02-14 DIAGNOSIS — E785 Hyperlipidemia, unspecified: Secondary | ICD-10-CM | POA: Diagnosis not present

## 2014-02-14 DIAGNOSIS — E1165 Type 2 diabetes mellitus with hyperglycemia: Secondary | ICD-10-CM

## 2014-02-14 DIAGNOSIS — Q758 Other specified congenital malformations of skull and face bones: Secondary | ICD-10-CM | POA: Diagnosis not present

## 2014-02-14 DIAGNOSIS — IMO0002 Reserved for concepts with insufficient information to code with codable children: Secondary | ICD-10-CM

## 2014-02-14 DIAGNOSIS — S13120D Subluxation of C1/C2 cervical vertebrae, subsequent encounter: Secondary | ICD-10-CM | POA: Diagnosis not present

## 2014-02-14 DIAGNOSIS — Z794 Long term (current) use of insulin: Secondary | ICD-10-CM | POA: Diagnosis not present

## 2014-02-14 DIAGNOSIS — M4312 Spondylolisthesis, cervical region: Secondary | ICD-10-CM | POA: Diagnosis not present

## 2014-02-14 DIAGNOSIS — M4802 Spinal stenosis, cervical region: Secondary | ICD-10-CM | POA: Diagnosis not present

## 2014-02-14 HISTORY — DX: Reserved for concepts with insufficient information to code with codable children: IMO0002

## 2014-02-14 HISTORY — DX: Type 2 diabetes mellitus with hyperglycemia: E11.65

## 2014-02-20 DIAGNOSIS — E1165 Type 2 diabetes mellitus with hyperglycemia: Secondary | ICD-10-CM | POA: Diagnosis not present

## 2014-02-20 DIAGNOSIS — G63 Polyneuropathy in diseases classified elsewhere: Secondary | ICD-10-CM | POA: Diagnosis not present

## 2014-02-20 DIAGNOSIS — E1149 Type 2 diabetes mellitus with other diabetic neurological complication: Secondary | ICD-10-CM | POA: Diagnosis not present

## 2014-02-20 DIAGNOSIS — E1129 Type 2 diabetes mellitus with other diabetic kidney complication: Secondary | ICD-10-CM | POA: Diagnosis not present

## 2014-02-26 DIAGNOSIS — L84 Corns and callosities: Secondary | ICD-10-CM | POA: Diagnosis not present

## 2014-02-26 DIAGNOSIS — L97519 Non-pressure chronic ulcer of other part of right foot with unspecified severity: Secondary | ICD-10-CM | POA: Diagnosis not present

## 2014-02-26 DIAGNOSIS — E11621 Type 2 diabetes mellitus with foot ulcer: Secondary | ICD-10-CM | POA: Diagnosis not present

## 2014-02-28 DIAGNOSIS — E1169 Type 2 diabetes mellitus with other specified complication: Secondary | ICD-10-CM | POA: Diagnosis not present

## 2014-02-28 DIAGNOSIS — L97519 Non-pressure chronic ulcer of other part of right foot with unspecified severity: Secondary | ICD-10-CM | POA: Diagnosis not present

## 2014-02-28 DIAGNOSIS — E1140 Type 2 diabetes mellitus with diabetic neuropathy, unspecified: Secondary | ICD-10-CM | POA: Diagnosis not present

## 2014-02-28 DIAGNOSIS — E11621 Type 2 diabetes mellitus with foot ulcer: Secondary | ICD-10-CM | POA: Diagnosis not present

## 2014-03-08 ENCOUNTER — Ambulatory Visit (INDEPENDENT_AMBULATORY_CARE_PROVIDER_SITE_OTHER): Payer: Medicare Other

## 2014-03-08 VITALS — BP 129/70 | HR 74 | Resp 12

## 2014-03-08 DIAGNOSIS — L97519 Non-pressure chronic ulcer of other part of right foot with unspecified severity: Secondary | ICD-10-CM

## 2014-03-08 DIAGNOSIS — E08621 Diabetes mellitus due to underlying condition with foot ulcer: Secondary | ICD-10-CM | POA: Diagnosis not present

## 2014-03-08 DIAGNOSIS — M2012 Hallux valgus (acquired), left foot: Secondary | ICD-10-CM

## 2014-03-08 DIAGNOSIS — E1140 Type 2 diabetes mellitus with diabetic neuropathy, unspecified: Secondary | ICD-10-CM | POA: Diagnosis not present

## 2014-03-08 DIAGNOSIS — M2021 Hallux rigidus, right foot: Secondary | ICD-10-CM

## 2014-03-08 NOTE — Patient Instructions (Signed)
ANTIBACTERIAL SOAP INSTRUCTIONS  THE DAY AFTER PROCEDURE  Please follow the instructions your doctor has marked.   Shower as usual. Before getting out, place a drop of antibacterial liquid soap (Dial) on a wet, clean washcloth.  Gently wipe washcloth over affected area.  Afterward, rinse the area with warm water.  Blot the area dry with a soft cloth and cover with antibiotic ointment (neosporin, polysporin, bacitracin) and band aid or gauze and tape  Place 3-4 drops of antibacterial liquid soap in a quart of warm tap water.  Submerge foot into water for 20 minutes.  If bandage was applied after your procedure, leave on to allow for easy lift off, then remove and continue with soak for the remaining time.  Next, blot area dry with a soft cloth and cover with a bandage.  Apply other medications as directed by your doctor, such as cortisporin otic solution (eardrops) or neosporin antibiotic ointment  Recommend daily cleansing with antibacterial soap and warm water apply antibiotic ointment and gauze dressing with paper tape avoid any plastic Band-Aids were which may have latex

## 2014-03-08 NOTE — Progress Notes (Signed)
Subjective:    Patient ID: [name], female    DOB: 01-22-1941, 73 y.o.   MRN: [id number]  HPI  PT STATED RT BALL OF THE FOOT HAVE CALLUS AND BEEN THERE FOR 1 WEEK. FOOT IS GETTING BETTER BUT CANNOT FEEL ANYTHING. TRIED TO KEEP IT TRIM AS NEEDED.  Review of Systems  Constitutional: Positive for activity change, fatigue and unexpected weight change.  HENT: Positive for sinus pressure and trouble swallowing.   Cardiovascular: Positive for leg swelling.  Gastrointestinal: Positive for abdominal pain and constipation.  Musculoskeletal: Positive for myalgias, back pain, joint swelling and gait problem.  Skin: Positive for color change and wound.  Neurological: Positive for tremors, weakness, light-headedness and numbness.  Psychiatric/Behavioral: The patient is nervous/anxious.        Objective:   Physical Exam 73 year old white female well-developed well-nourished oriented 3 presents at this time a referral from Dr. Lorin Picket with apparently ulcer plantar right foot sub-fourth metatarsal area. This is been there for a little over a week apparently had been cultured and cleansed and debrided patient is on antibiotic ointment.  Lower extremity objective findings as follows vascular status reveals intact pedal pulses DP and PT one over 4 bilateral intact with thready skin temperature warm to cool turgor somewhat diminished there is no edema mild varicosities noted no rubor pallor noted capillary refill time 3-4 seconds all digits. Neurologically epicritic and proprioceptive sensations intact although diminished on Semmes Weinstein to the forefoot digits and arch. 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 Patient does have also history of rheumatoid arthritis causing deformities of feet and hands contributing to her difficulties in the past had diabetic shoes however currently wearing a pair of slippers and socks which may not be adequate to protect her feet. Nail somewhat criptotic incurvated and brittle with discoloration and yellowing possibly early onychomycosis may address nail care in the future if needed. Again there is an area of hemorrhage a keratoses with a pre-ulcer ulcer sub-fourth metatarsal area right foot about a 3 mm x 2 mm area of ulceration down to dermal level only is identified this is cleansed with all cleansed Silvadene and a gauze dressings applied again debrided with a slight pinpoint bleeding treatment with lumicain and Silvadene will follow-up in the next 2-3 weeks patient is a strong candidate for diabetic extra-depth shoes.       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 We'll order diabetic shoes and custom inlays once authorization for those have been received from her primary physician Dr.  Scott reappointed in 3-4 weeks for follow-up next  Alvan Dame DPM

## 2014-03-22 ENCOUNTER — Ambulatory Visit (INDEPENDENT_AMBULATORY_CARE_PROVIDER_SITE_OTHER): Payer: Medicare Other

## 2014-03-22 VITALS — BP 132/64 | HR 70 | Resp 12

## 2014-03-22 DIAGNOSIS — E08621 Diabetes mellitus due to underlying condition with foot ulcer: Secondary | ICD-10-CM

## 2014-03-22 DIAGNOSIS — E1140 Type 2 diabetes mellitus with diabetic neuropathy, unspecified: Secondary | ICD-10-CM

## 2014-03-22 DIAGNOSIS — M2012 Hallux valgus (acquired), left foot: Secondary | ICD-10-CM

## 2014-03-22 DIAGNOSIS — L97519 Non-pressure chronic ulcer of other part of right foot with unspecified severity: Secondary | ICD-10-CM | POA: Diagnosis not present

## 2014-03-22 DIAGNOSIS — IMO0002 Reserved for concepts with insufficient information to code with codable children: Secondary | ICD-10-CM

## 2014-03-22 DIAGNOSIS — E1165 Type 2 diabetes mellitus with hyperglycemia: Secondary | ICD-10-CM | POA: Diagnosis not present

## 2014-03-22 DIAGNOSIS — M2021 Hallux rigidus, right foot: Secondary | ICD-10-CM | POA: Diagnosis not present

## 2014-03-22 NOTE — Patient Instructions (Signed)
ANTIBACTERIAL SOAP INSTRUCTIONS  THE DAY AFTER PROCEDURE  Please follow the instructions your doctor has marked.   Shower as usual. Before getting out, place a drop of antibacterial liquid soap (Dial) on a wet, clean washcloth.  Gently wipe washcloth over affected area.  Afterward, rinse the area with warm water.  Blot the area dry with a soft cloth and cover with antibiotic ointment (neosporin, polysporin, bacitracin) and band aid or gauze and tape  Place 3-4 drops of antibacterial liquid soap in a quart of warm tap water.  Submerge foot into water for 20 minutes.  If bandage was applied after your procedure, leave on to allow for easy lift off, then remove and continue with soak for the remaining time.  Next, blot area dry with a soft cloth and cover with a bandage.  Apply other medications as directed by your doctor, such as cortisporin otic solution (eardrops) or neosporin antibiotic ointment  Continue daily cleansing with antibacterial soap and water apply Silvadene and gauze dressing until healed resolved

## 2014-03-22 NOTE — Progress Notes (Signed)
   Subjective:    Patient ID: [name], female    DOB: August 09, 1940, 73 y.o.   MRN: [id number]  HPI  ''RT FOOT IS DOING OK, BUT LAST TIME DR.Elgin Carn CUT IT IS BLEEDING.''  Review of Systems no new findings or systemic changes noted     Objective:   Physical Exam Lower extremity objective findings are unchanged patient needs to have a hemorrhage a keratoses sub-third fourth metatarsal area right foot secondary to plantar flexed metatarsal and atrophy of fat pad does have some hallux rigidus and extensors of the hallux at the IP joint early no open wound or ulcer no purulence no discharge drainage no signs of infection. It appears to be healing ulcer sub-third fourth metatarsal with some hemorrhage a keratoses. The area is then dressed this time with Silvadene and Band-Aid dressing to offload waiting on authorization for diabetic extra depth shoes and custom inlays. Reappointed in one month for follow-up possible shoe measurement fitting and dispensing of shoes and inlays when available. Also do palliative care for nails the future and continued palliative care debridement of corns or calluses as needed.       Assessment & Plan:  Assessment resolving are stable ulceration sub-third fourth metatarsal of the right foot no active bleeding no discharge or drainage keratosis is identified at this time Silvadene and Band-Aid dressing will be maintained on an as-needed basis patient will maintain appropriate coming shoes wearing an orthotic heel shoe at this time which is accommodating however still waiting authorization for diabetic extra-depth shoes follow-up in 1 month  Alvan Dame DPM

## 2014-04-12 ENCOUNTER — Ambulatory Visit: Payer: Medicare Other

## 2014-04-15 ENCOUNTER — Ambulatory Visit: Payer: Medicare Other

## 2014-04-30 DIAGNOSIS — Z96653 Presence of artificial knee joint, bilateral: Secondary | ICD-10-CM | POA: Diagnosis not present

## 2014-04-30 DIAGNOSIS — Z79899 Other long term (current) drug therapy: Secondary | ICD-10-CM | POA: Diagnosis not present

## 2014-04-30 DIAGNOSIS — I272 Other secondary pulmonary hypertension: Secondary | ICD-10-CM | POA: Diagnosis not present

## 2014-04-30 DIAGNOSIS — R928 Other abnormal and inconclusive findings on diagnostic imaging of breast: Secondary | ICD-10-CM | POA: Diagnosis not present

## 2014-04-30 DIAGNOSIS — E785 Hyperlipidemia, unspecified: Secondary | ICD-10-CM | POA: Diagnosis not present

## 2014-04-30 DIAGNOSIS — F419 Anxiety disorder, unspecified: Secondary | ICD-10-CM | POA: Diagnosis not present

## 2014-04-30 DIAGNOSIS — Z794 Long term (current) use of insulin: Secondary | ICD-10-CM | POA: Diagnosis not present

## 2014-04-30 DIAGNOSIS — Z9049 Acquired absence of other specified parts of digestive tract: Secondary | ICD-10-CM | POA: Diagnosis not present

## 2014-04-30 DIAGNOSIS — K219 Gastro-esophageal reflux disease without esophagitis: Secondary | ICD-10-CM | POA: Diagnosis not present

## 2014-04-30 DIAGNOSIS — M069 Rheumatoid arthritis, unspecified: Secondary | ICD-10-CM | POA: Diagnosis not present

## 2014-04-30 DIAGNOSIS — E119 Type 2 diabetes mellitus without complications: Secondary | ICD-10-CM | POA: Diagnosis not present

## 2014-04-30 DIAGNOSIS — R921 Mammographic calcification found on diagnostic imaging of breast: Secondary | ICD-10-CM | POA: Diagnosis not present

## 2014-04-30 DIAGNOSIS — E039 Hypothyroidism, unspecified: Secondary | ICD-10-CM | POA: Diagnosis not present

## 2014-04-30 DIAGNOSIS — M3500 Sicca syndrome, unspecified: Secondary | ICD-10-CM | POA: Diagnosis not present

## 2014-05-10 DIAGNOSIS — M069 Rheumatoid arthritis, unspecified: Secondary | ICD-10-CM | POA: Diagnosis not present

## 2014-05-10 DIAGNOSIS — Z6834 Body mass index (BMI) 34.0-34.9, adult: Secondary | ICD-10-CM | POA: Diagnosis not present

## 2014-05-10 DIAGNOSIS — M25519 Pain in unspecified shoulder: Secondary | ICD-10-CM | POA: Diagnosis not present

## 2014-05-17 DIAGNOSIS — F419 Anxiety disorder, unspecified: Secondary | ICD-10-CM | POA: Diagnosis not present

## 2014-05-17 DIAGNOSIS — E785 Hyperlipidemia, unspecified: Secondary | ICD-10-CM | POA: Diagnosis not present

## 2014-05-17 DIAGNOSIS — L089 Local infection of the skin and subcutaneous tissue, unspecified: Secondary | ICD-10-CM | POA: Diagnosis not present

## 2014-05-17 DIAGNOSIS — L0291 Cutaneous abscess, unspecified: Secondary | ICD-10-CM | POA: Diagnosis not present

## 2014-05-17 DIAGNOSIS — M7989 Other specified soft tissue disorders: Secondary | ICD-10-CM | POA: Diagnosis not present

## 2014-05-17 DIAGNOSIS — G8929 Other chronic pain: Secondary | ICD-10-CM | POA: Diagnosis not present

## 2014-05-17 DIAGNOSIS — I878 Other specified disorders of veins: Secondary | ICD-10-CM | POA: Diagnosis not present

## 2014-05-17 DIAGNOSIS — M0589 Other rheumatoid arthritis with rheumatoid factor of multiple sites: Secondary | ICD-10-CM | POA: Diagnosis not present

## 2014-05-17 DIAGNOSIS — N183 Chronic kidney disease, stage 3 (moderate): Secondary | ICD-10-CM | POA: Diagnosis not present

## 2014-05-17 DIAGNOSIS — L02416 Cutaneous abscess of left lower limb: Secondary | ICD-10-CM | POA: Diagnosis not present

## 2014-05-17 DIAGNOSIS — L97229 Non-pressure chronic ulcer of left calf with unspecified severity: Secondary | ICD-10-CM | POA: Diagnosis not present

## 2014-05-17 DIAGNOSIS — E039 Hypothyroidism, unspecified: Secondary | ICD-10-CM | POA: Diagnosis not present

## 2014-05-17 DIAGNOSIS — M542 Cervicalgia: Secondary | ICD-10-CM | POA: Diagnosis not present

## 2014-05-17 DIAGNOSIS — M059 Rheumatoid arthritis with rheumatoid factor, unspecified: Secondary | ICD-10-CM | POA: Diagnosis not present

## 2014-05-17 DIAGNOSIS — J309 Allergic rhinitis, unspecified: Secondary | ICD-10-CM | POA: Diagnosis not present

## 2014-05-17 DIAGNOSIS — M8580 Other specified disorders of bone density and structure, unspecified site: Secondary | ICD-10-CM | POA: Diagnosis not present

## 2014-05-17 DIAGNOSIS — M3500 Sicca syndrome, unspecified: Secondary | ICD-10-CM | POA: Diagnosis not present

## 2014-05-17 DIAGNOSIS — Z882 Allergy status to sulfonamides status: Secondary | ICD-10-CM | POA: Diagnosis not present

## 2014-05-17 DIAGNOSIS — M545 Low back pain: Secondary | ICD-10-CM | POA: Diagnosis not present

## 2014-05-17 DIAGNOSIS — Z96653 Presence of artificial knee joint, bilateral: Secondary | ICD-10-CM | POA: Diagnosis not present

## 2014-05-17 DIAGNOSIS — I129 Hypertensive chronic kidney disease with stage 1 through stage 4 chronic kidney disease, or unspecified chronic kidney disease: Secondary | ICD-10-CM | POA: Diagnosis not present

## 2014-05-17 DIAGNOSIS — Z7952 Long term (current) use of systemic steroids: Secondary | ICD-10-CM | POA: Diagnosis not present

## 2014-05-17 DIAGNOSIS — Z9049 Acquired absence of other specified parts of digestive tract: Secondary | ICD-10-CM | POA: Diagnosis not present

## 2014-05-17 DIAGNOSIS — Z79899 Other long term (current) drug therapy: Secondary | ICD-10-CM | POA: Diagnosis not present

## 2014-05-17 DIAGNOSIS — E1140 Type 2 diabetes mellitus with diabetic neuropathy, unspecified: Secondary | ICD-10-CM | POA: Diagnosis not present

## 2014-05-17 DIAGNOSIS — L97219 Non-pressure chronic ulcer of right calf with unspecified severity: Secondary | ICD-10-CM | POA: Diagnosis not present

## 2014-05-17 DIAGNOSIS — K219 Gastro-esophageal reflux disease without esophagitis: Secondary | ICD-10-CM | POA: Diagnosis not present

## 2014-05-20 ENCOUNTER — Ambulatory Visit: Payer: Medicare Other

## 2014-05-31 DIAGNOSIS — L02415 Cutaneous abscess of right lower limb: Secondary | ICD-10-CM | POA: Diagnosis not present

## 2014-05-31 DIAGNOSIS — A429 Actinomycosis, unspecified: Secondary | ICD-10-CM | POA: Insufficient documentation

## 2014-05-31 DIAGNOSIS — M0589 Other rheumatoid arthritis with rheumatoid factor of multiple sites: Secondary | ICD-10-CM | POA: Diagnosis not present

## 2014-05-31 DIAGNOSIS — B9689 Other specified bacterial agents as the cause of diseases classified elsewhere: Secondary | ICD-10-CM | POA: Diagnosis not present

## 2014-05-31 HISTORY — DX: Actinomycosis, unspecified: A42.9

## 2014-06-13 DIAGNOSIS — Z681 Body mass index (BMI) 19 or less, adult: Secondary | ICD-10-CM | POA: Diagnosis not present

## 2014-06-13 DIAGNOSIS — M0570 Rheumatoid arthritis with rheumatoid factor of unspecified site without organ or systems involvement: Secondary | ICD-10-CM | POA: Diagnosis not present

## 2014-06-14 DIAGNOSIS — I83029 Varicose veins of left lower extremity with ulcer of unspecified site: Secondary | ICD-10-CM | POA: Diagnosis not present

## 2014-06-14 DIAGNOSIS — E1140 Type 2 diabetes mellitus with diabetic neuropathy, unspecified: Secondary | ICD-10-CM | POA: Diagnosis not present

## 2014-06-21 DIAGNOSIS — D649 Anemia, unspecified: Secondary | ICD-10-CM | POA: Diagnosis not present

## 2014-06-21 DIAGNOSIS — I10 Essential (primary) hypertension: Secondary | ICD-10-CM | POA: Diagnosis not present

## 2014-06-21 DIAGNOSIS — M069 Rheumatoid arthritis, unspecified: Secondary | ICD-10-CM | POA: Diagnosis not present

## 2014-06-21 DIAGNOSIS — E1142 Type 2 diabetes mellitus with diabetic polyneuropathy: Secondary | ICD-10-CM | POA: Diagnosis not present

## 2014-06-21 DIAGNOSIS — F40240 Claustrophobia: Secondary | ICD-10-CM | POA: Diagnosis not present

## 2014-06-21 DIAGNOSIS — I82409 Acute embolism and thrombosis of unspecified deep veins of unspecified lower extremity: Secondary | ICD-10-CM | POA: Diagnosis not present

## 2014-06-21 DIAGNOSIS — I87312 Chronic venous hypertension (idiopathic) with ulcer of left lower extremity: Secondary | ICD-10-CM | POA: Diagnosis not present

## 2014-06-21 DIAGNOSIS — E11622 Type 2 diabetes mellitus with other skin ulcer: Secondary | ICD-10-CM | POA: Diagnosis not present

## 2014-06-21 DIAGNOSIS — L97821 Non-pressure chronic ulcer of other part of left lower leg limited to breakdown of skin: Secondary | ICD-10-CM | POA: Diagnosis not present

## 2014-06-21 DIAGNOSIS — Z794 Long term (current) use of insulin: Secondary | ICD-10-CM | POA: Diagnosis not present

## 2014-06-24 DIAGNOSIS — E119 Type 2 diabetes mellitus without complications: Secondary | ICD-10-CM | POA: Diagnosis not present

## 2014-06-24 DIAGNOSIS — I87312 Chronic venous hypertension (idiopathic) with ulcer of left lower extremity: Secondary | ICD-10-CM | POA: Diagnosis not present

## 2014-06-24 DIAGNOSIS — E11622 Type 2 diabetes mellitus with other skin ulcer: Secondary | ICD-10-CM | POA: Diagnosis not present

## 2014-06-24 DIAGNOSIS — L97821 Non-pressure chronic ulcer of other part of left lower leg limited to breakdown of skin: Secondary | ICD-10-CM | POA: Diagnosis not present

## 2014-07-01 DIAGNOSIS — L97821 Non-pressure chronic ulcer of other part of left lower leg limited to breakdown of skin: Secondary | ICD-10-CM | POA: Diagnosis not present

## 2014-07-01 DIAGNOSIS — E11622 Type 2 diabetes mellitus with other skin ulcer: Secondary | ICD-10-CM | POA: Diagnosis not present

## 2014-07-01 DIAGNOSIS — E119 Type 2 diabetes mellitus without complications: Secondary | ICD-10-CM | POA: Diagnosis not present

## 2014-07-01 DIAGNOSIS — I87312 Chronic venous hypertension (idiopathic) with ulcer of left lower extremity: Secondary | ICD-10-CM | POA: Diagnosis not present

## 2014-07-04 DIAGNOSIS — I8392 Asymptomatic varicose veins of left lower extremity: Secondary | ICD-10-CM | POA: Diagnosis not present

## 2014-07-04 DIAGNOSIS — I872 Venous insufficiency (chronic) (peripheral): Secondary | ICD-10-CM | POA: Diagnosis not present

## 2014-07-04 DIAGNOSIS — E119 Type 2 diabetes mellitus without complications: Secondary | ICD-10-CM | POA: Diagnosis not present

## 2014-07-04 DIAGNOSIS — I87312 Chronic venous hypertension (idiopathic) with ulcer of left lower extremity: Secondary | ICD-10-CM | POA: Diagnosis not present

## 2014-07-05 DIAGNOSIS — L97821 Non-pressure chronic ulcer of other part of left lower leg limited to breakdown of skin: Secondary | ICD-10-CM | POA: Diagnosis not present

## 2014-07-05 DIAGNOSIS — I87312 Chronic venous hypertension (idiopathic) with ulcer of left lower extremity: Secondary | ICD-10-CM | POA: Diagnosis not present

## 2014-07-05 DIAGNOSIS — E11622 Type 2 diabetes mellitus with other skin ulcer: Secondary | ICD-10-CM | POA: Diagnosis not present

## 2014-07-09 DIAGNOSIS — Z7952 Long term (current) use of systemic steroids: Secondary | ICD-10-CM | POA: Diagnosis not present

## 2014-07-09 DIAGNOSIS — Z794 Long term (current) use of insulin: Secondary | ICD-10-CM | POA: Diagnosis not present

## 2014-07-09 DIAGNOSIS — E1140 Type 2 diabetes mellitus with diabetic neuropathy, unspecified: Secondary | ICD-10-CM | POA: Diagnosis not present

## 2014-07-09 DIAGNOSIS — F419 Anxiety disorder, unspecified: Secondary | ICD-10-CM | POA: Diagnosis not present

## 2014-07-09 DIAGNOSIS — A488 Other specified bacterial diseases: Secondary | ICD-10-CM | POA: Diagnosis not present

## 2014-07-09 DIAGNOSIS — Z886 Allergy status to analgesic agent status: Secondary | ICD-10-CM | POA: Diagnosis not present

## 2014-07-09 DIAGNOSIS — A429 Actinomycosis, unspecified: Secondary | ICD-10-CM | POA: Diagnosis not present

## 2014-07-09 DIAGNOSIS — E039 Hypothyroidism, unspecified: Secondary | ICD-10-CM | POA: Diagnosis not present

## 2014-07-09 DIAGNOSIS — Z96653 Presence of artificial knee joint, bilateral: Secondary | ICD-10-CM | POA: Diagnosis not present

## 2014-07-09 DIAGNOSIS — R109 Unspecified abdominal pain: Secondary | ICD-10-CM | POA: Diagnosis not present

## 2014-07-09 DIAGNOSIS — Z881 Allergy status to other antibiotic agents status: Secondary | ICD-10-CM | POA: Diagnosis not present

## 2014-07-09 DIAGNOSIS — R600 Localized edema: Secondary | ICD-10-CM | POA: Diagnosis not present

## 2014-07-09 DIAGNOSIS — M069 Rheumatoid arthritis, unspecified: Secondary | ICD-10-CM | POA: Diagnosis not present

## 2014-07-09 DIAGNOSIS — T368X5D Adverse effect of other systemic antibiotics, subsequent encounter: Secondary | ICD-10-CM | POA: Diagnosis not present

## 2014-07-09 DIAGNOSIS — I878 Other specified disorders of veins: Secondary | ICD-10-CM | POA: Diagnosis not present

## 2014-07-09 DIAGNOSIS — L97209 Non-pressure chronic ulcer of unspecified calf with unspecified severity: Secondary | ICD-10-CM | POA: Diagnosis not present

## 2014-07-09 DIAGNOSIS — Z9842 Cataract extraction status, left eye: Secondary | ICD-10-CM | POA: Diagnosis not present

## 2014-07-09 DIAGNOSIS — M3500 Sicca syndrome, unspecified: Secondary | ICD-10-CM | POA: Diagnosis not present

## 2014-07-09 DIAGNOSIS — Z9841 Cataract extraction status, right eye: Secondary | ICD-10-CM | POA: Diagnosis not present

## 2014-07-09 DIAGNOSIS — Z882 Allergy status to sulfonamides status: Secondary | ICD-10-CM | POA: Diagnosis not present

## 2014-07-09 DIAGNOSIS — I10 Essential (primary) hypertension: Secondary | ICD-10-CM | POA: Diagnosis not present

## 2014-07-09 DIAGNOSIS — M0510 Rheumatoid lung disease with rheumatoid arthritis of unspecified site: Secondary | ICD-10-CM | POA: Diagnosis not present

## 2014-07-09 DIAGNOSIS — J309 Allergic rhinitis, unspecified: Secondary | ICD-10-CM | POA: Diagnosis not present

## 2014-07-09 DIAGNOSIS — E785 Hyperlipidemia, unspecified: Secondary | ICD-10-CM | POA: Diagnosis not present

## 2014-07-09 DIAGNOSIS — K219 Gastro-esophageal reflux disease without esophagitis: Secondary | ICD-10-CM | POA: Diagnosis not present

## 2014-07-10 DIAGNOSIS — I87312 Chronic venous hypertension (idiopathic) with ulcer of left lower extremity: Secondary | ICD-10-CM | POA: Diagnosis not present

## 2014-07-10 DIAGNOSIS — E11622 Type 2 diabetes mellitus with other skin ulcer: Secondary | ICD-10-CM | POA: Diagnosis not present

## 2014-07-10 DIAGNOSIS — I87302 Chronic venous hypertension (idiopathic) without complications of left lower extremity: Secondary | ICD-10-CM | POA: Diagnosis not present

## 2014-07-10 DIAGNOSIS — L97821 Non-pressure chronic ulcer of other part of left lower leg limited to breakdown of skin: Secondary | ICD-10-CM | POA: Diagnosis not present

## 2014-07-12 DIAGNOSIS — L97821 Non-pressure chronic ulcer of other part of left lower leg limited to breakdown of skin: Secondary | ICD-10-CM | POA: Diagnosis not present

## 2014-07-12 DIAGNOSIS — I87312 Chronic venous hypertension (idiopathic) with ulcer of left lower extremity: Secondary | ICD-10-CM | POA: Diagnosis not present

## 2014-07-12 DIAGNOSIS — E11622 Type 2 diabetes mellitus with other skin ulcer: Secondary | ICD-10-CM | POA: Diagnosis not present

## 2014-07-17 DIAGNOSIS — I87312 Chronic venous hypertension (idiopathic) with ulcer of left lower extremity: Secondary | ICD-10-CM | POA: Diagnosis not present

## 2014-07-17 DIAGNOSIS — I87302 Chronic venous hypertension (idiopathic) without complications of left lower extremity: Secondary | ICD-10-CM | POA: Diagnosis not present

## 2014-07-17 DIAGNOSIS — E11622 Type 2 diabetes mellitus with other skin ulcer: Secondary | ICD-10-CM | POA: Diagnosis not present

## 2014-07-17 DIAGNOSIS — L97821 Non-pressure chronic ulcer of other part of left lower leg limited to breakdown of skin: Secondary | ICD-10-CM | POA: Diagnosis not present

## 2014-07-22 DIAGNOSIS — Z87440 Personal history of urinary (tract) infections: Secondary | ICD-10-CM | POA: Diagnosis not present

## 2014-07-22 DIAGNOSIS — N183 Chronic kidney disease, stage 3 unspecified: Secondary | ICD-10-CM

## 2014-07-22 DIAGNOSIS — Z7952 Long term (current) use of systemic steroids: Secondary | ICD-10-CM | POA: Diagnosis not present

## 2014-07-22 DIAGNOSIS — M1990 Unspecified osteoarthritis, unspecified site: Secondary | ICD-10-CM | POA: Diagnosis not present

## 2014-07-22 DIAGNOSIS — I129 Hypertensive chronic kidney disease with stage 1 through stage 4 chronic kidney disease, or unspecified chronic kidney disease: Secondary | ICD-10-CM | POA: Diagnosis not present

## 2014-07-22 DIAGNOSIS — Z8619 Personal history of other infectious and parasitic diseases: Secondary | ICD-10-CM | POA: Diagnosis not present

## 2014-07-22 DIAGNOSIS — Z794 Long term (current) use of insulin: Secondary | ICD-10-CM | POA: Diagnosis not present

## 2014-07-22 DIAGNOSIS — D631 Anemia in chronic kidney disease: Secondary | ICD-10-CM | POA: Diagnosis not present

## 2014-07-22 DIAGNOSIS — R809 Proteinuria, unspecified: Secondary | ICD-10-CM | POA: Diagnosis not present

## 2014-07-22 DIAGNOSIS — M069 Rheumatoid arthritis, unspecified: Secondary | ICD-10-CM | POA: Diagnosis not present

## 2014-07-22 DIAGNOSIS — M4312 Spondylolisthesis, cervical region: Secondary | ICD-10-CM | POA: Diagnosis not present

## 2014-07-22 DIAGNOSIS — E1122 Type 2 diabetes mellitus with diabetic chronic kidney disease: Secondary | ICD-10-CM | POA: Diagnosis not present

## 2014-07-22 DIAGNOSIS — E1121 Type 2 diabetes mellitus with diabetic nephropathy: Secondary | ICD-10-CM | POA: Diagnosis not present

## 2014-07-22 DIAGNOSIS — R600 Localized edema: Secondary | ICD-10-CM | POA: Diagnosis not present

## 2014-07-22 HISTORY — DX: Chronic kidney disease, stage 3 unspecified: N18.30

## 2014-07-24 DIAGNOSIS — I87312 Chronic venous hypertension (idiopathic) with ulcer of left lower extremity: Secondary | ICD-10-CM | POA: Diagnosis not present

## 2014-07-24 DIAGNOSIS — L97821 Non-pressure chronic ulcer of other part of left lower leg limited to breakdown of skin: Secondary | ICD-10-CM | POA: Diagnosis not present

## 2014-07-24 DIAGNOSIS — I87302 Chronic venous hypertension (idiopathic) without complications of left lower extremity: Secondary | ICD-10-CM | POA: Diagnosis not present

## 2014-07-24 DIAGNOSIS — E11622 Type 2 diabetes mellitus with other skin ulcer: Secondary | ICD-10-CM | POA: Diagnosis not present

## 2014-07-25 DIAGNOSIS — G5600 Carpal tunnel syndrome, unspecified upper limb: Secondary | ICD-10-CM | POA: Diagnosis not present

## 2014-07-25 DIAGNOSIS — M545 Low back pain: Secondary | ICD-10-CM | POA: Diagnosis not present

## 2014-07-25 DIAGNOSIS — N183 Chronic kidney disease, stage 3 (moderate): Secondary | ICD-10-CM | POA: Diagnosis not present

## 2014-07-25 DIAGNOSIS — M8580 Other specified disorders of bone density and structure, unspecified site: Secondary | ICD-10-CM | POA: Diagnosis not present

## 2014-07-25 DIAGNOSIS — G5750 Tarsal tunnel syndrome, unspecified lower limb: Secondary | ICD-10-CM | POA: Diagnosis not present

## 2014-07-25 DIAGNOSIS — I878 Other specified disorders of veins: Secondary | ICD-10-CM | POA: Diagnosis not present

## 2014-07-25 DIAGNOSIS — L02818 Cutaneous abscess of other sites: Secondary | ICD-10-CM | POA: Diagnosis not present

## 2014-07-25 DIAGNOSIS — E119 Type 2 diabetes mellitus without complications: Secondary | ICD-10-CM | POA: Diagnosis not present

## 2014-07-25 DIAGNOSIS — E039 Hypothyroidism, unspecified: Secondary | ICD-10-CM | POA: Diagnosis not present

## 2014-07-25 DIAGNOSIS — M3500 Sicca syndrome, unspecified: Secondary | ICD-10-CM | POA: Diagnosis not present

## 2014-07-25 DIAGNOSIS — M0589 Other rheumatoid arthritis with rheumatoid factor of multiple sites: Secondary | ICD-10-CM | POA: Diagnosis not present

## 2014-07-25 DIAGNOSIS — Z792 Long term (current) use of antibiotics: Secondary | ICD-10-CM | POA: Diagnosis not present

## 2014-07-25 DIAGNOSIS — G629 Polyneuropathy, unspecified: Secondary | ICD-10-CM | POA: Diagnosis not present

## 2014-07-25 DIAGNOSIS — M059 Rheumatoid arthritis with rheumatoid factor, unspecified: Secondary | ICD-10-CM | POA: Diagnosis not present

## 2014-07-25 DIAGNOSIS — Z79899 Other long term (current) drug therapy: Secondary | ICD-10-CM | POA: Diagnosis not present

## 2014-07-31 DIAGNOSIS — E119 Type 2 diabetes mellitus without complications: Secondary | ICD-10-CM | POA: Diagnosis not present

## 2014-07-31 DIAGNOSIS — L97821 Non-pressure chronic ulcer of other part of left lower leg limited to breakdown of skin: Secondary | ICD-10-CM | POA: Diagnosis not present

## 2014-07-31 DIAGNOSIS — I87312 Chronic venous hypertension (idiopathic) with ulcer of left lower extremity: Secondary | ICD-10-CM | POA: Diagnosis not present

## 2014-07-31 DIAGNOSIS — E11622 Type 2 diabetes mellitus with other skin ulcer: Secondary | ICD-10-CM | POA: Diagnosis not present

## 2014-07-31 DIAGNOSIS — I87302 Chronic venous hypertension (idiopathic) without complications of left lower extremity: Secondary | ICD-10-CM | POA: Diagnosis not present

## 2014-08-08 DIAGNOSIS — E11622 Type 2 diabetes mellitus with other skin ulcer: Secondary | ICD-10-CM | POA: Diagnosis not present

## 2014-08-08 DIAGNOSIS — L97821 Non-pressure chronic ulcer of other part of left lower leg limited to breakdown of skin: Secondary | ICD-10-CM | POA: Diagnosis not present

## 2014-08-08 DIAGNOSIS — E119 Type 2 diabetes mellitus without complications: Secondary | ICD-10-CM | POA: Diagnosis not present

## 2014-08-08 DIAGNOSIS — I87312 Chronic venous hypertension (idiopathic) with ulcer of left lower extremity: Secondary | ICD-10-CM | POA: Diagnosis not present

## 2014-08-15 DIAGNOSIS — E11622 Type 2 diabetes mellitus with other skin ulcer: Secondary | ICD-10-CM | POA: Diagnosis not present

## 2014-08-15 DIAGNOSIS — I87312 Chronic venous hypertension (idiopathic) with ulcer of left lower extremity: Secondary | ICD-10-CM | POA: Diagnosis not present

## 2014-08-15 DIAGNOSIS — L97821 Non-pressure chronic ulcer of other part of left lower leg limited to breakdown of skin: Secondary | ICD-10-CM | POA: Diagnosis not present

## 2014-08-15 DIAGNOSIS — E11621 Type 2 diabetes mellitus with foot ulcer: Secondary | ICD-10-CM | POA: Diagnosis not present

## 2014-08-21 DIAGNOSIS — L97821 Non-pressure chronic ulcer of other part of left lower leg limited to breakdown of skin: Secondary | ICD-10-CM | POA: Diagnosis not present

## 2014-08-21 DIAGNOSIS — E11622 Type 2 diabetes mellitus with other skin ulcer: Secondary | ICD-10-CM | POA: Diagnosis not present

## 2014-08-21 DIAGNOSIS — I87312 Chronic venous hypertension (idiopathic) with ulcer of left lower extremity: Secondary | ICD-10-CM | POA: Diagnosis not present

## 2014-08-29 DIAGNOSIS — E11622 Type 2 diabetes mellitus with other skin ulcer: Secondary | ICD-10-CM | POA: Diagnosis not present

## 2014-08-29 DIAGNOSIS — I87312 Chronic venous hypertension (idiopathic) with ulcer of left lower extremity: Secondary | ICD-10-CM | POA: Diagnosis not present

## 2014-08-29 DIAGNOSIS — L97821 Non-pressure chronic ulcer of other part of left lower leg limited to breakdown of skin: Secondary | ICD-10-CM | POA: Diagnosis not present

## 2014-09-04 DIAGNOSIS — E11622 Type 2 diabetes mellitus with other skin ulcer: Secondary | ICD-10-CM | POA: Diagnosis not present

## 2014-09-04 DIAGNOSIS — I87312 Chronic venous hypertension (idiopathic) with ulcer of left lower extremity: Secondary | ICD-10-CM | POA: Diagnosis not present

## 2014-09-04 DIAGNOSIS — L97821 Non-pressure chronic ulcer of other part of left lower leg limited to breakdown of skin: Secondary | ICD-10-CM | POA: Diagnosis not present

## 2014-09-10 DIAGNOSIS — Z6834 Body mass index (BMI) 34.0-34.9, adult: Secondary | ICD-10-CM | POA: Diagnosis not present

## 2014-09-10 DIAGNOSIS — M5030 Other cervical disc degeneration, unspecified cervical region: Secondary | ICD-10-CM | POA: Diagnosis not present

## 2014-09-10 DIAGNOSIS — A429 Actinomycosis, unspecified: Secondary | ICD-10-CM | POA: Diagnosis not present

## 2014-09-10 DIAGNOSIS — M0570 Rheumatoid arthritis with rheumatoid factor of unspecified site without organ or systems involvement: Secondary | ICD-10-CM | POA: Diagnosis not present

## 2014-09-11 DIAGNOSIS — I87312 Chronic venous hypertension (idiopathic) with ulcer of left lower extremity: Secondary | ICD-10-CM | POA: Diagnosis not present

## 2014-09-11 DIAGNOSIS — E11622 Type 2 diabetes mellitus with other skin ulcer: Secondary | ICD-10-CM | POA: Diagnosis not present

## 2014-09-11 DIAGNOSIS — L97821 Non-pressure chronic ulcer of other part of left lower leg limited to breakdown of skin: Secondary | ICD-10-CM | POA: Diagnosis not present

## 2014-09-19 DIAGNOSIS — L97821 Non-pressure chronic ulcer of other part of left lower leg limited to breakdown of skin: Secondary | ICD-10-CM | POA: Diagnosis not present

## 2014-09-19 DIAGNOSIS — E11622 Type 2 diabetes mellitus with other skin ulcer: Secondary | ICD-10-CM | POA: Diagnosis not present

## 2014-09-19 DIAGNOSIS — I87312 Chronic venous hypertension (idiopathic) with ulcer of left lower extremity: Secondary | ICD-10-CM | POA: Diagnosis not present

## 2014-09-25 DIAGNOSIS — E11622 Type 2 diabetes mellitus with other skin ulcer: Secondary | ICD-10-CM | POA: Diagnosis not present

## 2014-09-25 DIAGNOSIS — I87312 Chronic venous hypertension (idiopathic) with ulcer of left lower extremity: Secondary | ICD-10-CM | POA: Diagnosis not present

## 2014-09-25 DIAGNOSIS — L97821 Non-pressure chronic ulcer of other part of left lower leg limited to breakdown of skin: Secondary | ICD-10-CM | POA: Diagnosis not present

## 2014-10-02 DIAGNOSIS — L97821 Non-pressure chronic ulcer of other part of left lower leg limited to breakdown of skin: Secondary | ICD-10-CM | POA: Diagnosis not present

## 2014-10-02 DIAGNOSIS — L97222 Non-pressure chronic ulcer of left calf with fat layer exposed: Secondary | ICD-10-CM | POA: Diagnosis not present

## 2014-10-02 DIAGNOSIS — I87312 Chronic venous hypertension (idiopathic) with ulcer of left lower extremity: Secondary | ICD-10-CM | POA: Diagnosis not present

## 2014-10-02 DIAGNOSIS — E11622 Type 2 diabetes mellitus with other skin ulcer: Secondary | ICD-10-CM | POA: Diagnosis not present

## 2014-10-02 DIAGNOSIS — E119 Type 2 diabetes mellitus without complications: Secondary | ICD-10-CM | POA: Diagnosis not present

## 2014-10-10 DIAGNOSIS — E11622 Type 2 diabetes mellitus with other skin ulcer: Secondary | ICD-10-CM | POA: Diagnosis not present

## 2014-10-10 DIAGNOSIS — L97821 Non-pressure chronic ulcer of other part of left lower leg limited to breakdown of skin: Secondary | ICD-10-CM | POA: Diagnosis not present

## 2014-10-10 DIAGNOSIS — I87312 Chronic venous hypertension (idiopathic) with ulcer of left lower extremity: Secondary | ICD-10-CM | POA: Diagnosis not present

## 2014-10-11 DIAGNOSIS — N3000 Acute cystitis without hematuria: Secondary | ICD-10-CM | POA: Diagnosis not present

## 2014-10-11 DIAGNOSIS — R1013 Epigastric pain: Secondary | ICD-10-CM | POA: Diagnosis not present

## 2014-10-11 DIAGNOSIS — R109 Unspecified abdominal pain: Secondary | ICD-10-CM | POA: Diagnosis not present

## 2014-10-16 DIAGNOSIS — I87312 Chronic venous hypertension (idiopathic) with ulcer of left lower extremity: Secondary | ICD-10-CM | POA: Diagnosis not present

## 2014-10-16 DIAGNOSIS — L97821 Non-pressure chronic ulcer of other part of left lower leg limited to breakdown of skin: Secondary | ICD-10-CM | POA: Diagnosis not present

## 2014-10-16 DIAGNOSIS — E11622 Type 2 diabetes mellitus with other skin ulcer: Secondary | ICD-10-CM | POA: Diagnosis not present

## 2014-10-22 DIAGNOSIS — I87312 Chronic venous hypertension (idiopathic) with ulcer of left lower extremity: Secondary | ICD-10-CM | POA: Diagnosis not present

## 2014-10-22 DIAGNOSIS — E11622 Type 2 diabetes mellitus with other skin ulcer: Secondary | ICD-10-CM | POA: Diagnosis not present

## 2014-10-22 DIAGNOSIS — L97821 Non-pressure chronic ulcer of other part of left lower leg limited to breakdown of skin: Secondary | ICD-10-CM | POA: Diagnosis not present

## 2014-10-23 DIAGNOSIS — E785 Hyperlipidemia, unspecified: Secondary | ICD-10-CM | POA: Diagnosis not present

## 2014-10-23 DIAGNOSIS — M5031 Other cervical disc degeneration,  high cervical region: Secondary | ICD-10-CM | POA: Diagnosis not present

## 2014-10-23 DIAGNOSIS — E039 Hypothyroidism, unspecified: Secondary | ICD-10-CM | POA: Diagnosis not present

## 2014-10-23 DIAGNOSIS — Z9889 Other specified postprocedural states: Secondary | ICD-10-CM | POA: Diagnosis not present

## 2014-10-23 DIAGNOSIS — S13120D Subluxation of C1/C2 cervical vertebrae, subsequent encounter: Secondary | ICD-10-CM | POA: Diagnosis not present

## 2014-10-23 DIAGNOSIS — Z79899 Other long term (current) drug therapy: Secondary | ICD-10-CM | POA: Diagnosis not present

## 2014-10-23 DIAGNOSIS — I129 Hypertensive chronic kidney disease with stage 1 through stage 4 chronic kidney disease, or unspecified chronic kidney disease: Secondary | ICD-10-CM | POA: Diagnosis not present

## 2014-10-23 DIAGNOSIS — Q758 Other specified congenital malformations of skull and face bones: Secondary | ICD-10-CM | POA: Diagnosis not present

## 2014-10-23 DIAGNOSIS — Z882 Allergy status to sulfonamides status: Secondary | ICD-10-CM | POA: Diagnosis not present

## 2014-10-23 DIAGNOSIS — M069 Rheumatoid arthritis, unspecified: Secondary | ICD-10-CM | POA: Diagnosis not present

## 2014-10-23 DIAGNOSIS — G629 Polyneuropathy, unspecified: Secondary | ICD-10-CM | POA: Diagnosis not present

## 2014-10-23 DIAGNOSIS — N183 Chronic kidney disease, stage 3 (moderate): Secondary | ICD-10-CM | POA: Diagnosis not present

## 2014-10-23 DIAGNOSIS — K219 Gastro-esophageal reflux disease without esophagitis: Secondary | ICD-10-CM | POA: Diagnosis not present

## 2014-10-23 DIAGNOSIS — I872 Venous insufficiency (chronic) (peripheral): Secondary | ICD-10-CM | POA: Diagnosis not present

## 2014-10-23 DIAGNOSIS — Z79891 Long term (current) use of opiate analgesic: Secondary | ICD-10-CM | POA: Diagnosis not present

## 2014-10-23 DIAGNOSIS — L309 Dermatitis, unspecified: Secondary | ICD-10-CM | POA: Diagnosis not present

## 2014-10-23 DIAGNOSIS — M659 Synovitis and tenosynovitis, unspecified: Secondary | ICD-10-CM | POA: Diagnosis not present

## 2014-10-23 DIAGNOSIS — Z888 Allergy status to other drugs, medicaments and biological substances status: Secondary | ICD-10-CM | POA: Diagnosis not present

## 2014-10-23 DIAGNOSIS — M3500 Sicca syndrome, unspecified: Secondary | ICD-10-CM | POA: Diagnosis not present

## 2014-10-23 DIAGNOSIS — Z794 Long term (current) use of insulin: Secondary | ICD-10-CM | POA: Diagnosis not present

## 2014-10-23 DIAGNOSIS — Z7952 Long term (current) use of systemic steroids: Secondary | ICD-10-CM | POA: Diagnosis not present

## 2014-10-23 DIAGNOSIS — M5032 Other cervical disc degeneration, mid-cervical region: Secondary | ICD-10-CM | POA: Diagnosis not present

## 2014-10-23 DIAGNOSIS — M8580 Other specified disorders of bone density and structure, unspecified site: Secondary | ICD-10-CM | POA: Diagnosis not present

## 2014-10-23 DIAGNOSIS — E119 Type 2 diabetes mellitus without complications: Secondary | ICD-10-CM | POA: Diagnosis not present

## 2014-10-23 DIAGNOSIS — M0589 Other rheumatoid arthritis with rheumatoid factor of multiple sites: Secondary | ICD-10-CM | POA: Diagnosis not present

## 2014-10-29 DIAGNOSIS — Z882 Allergy status to sulfonamides status: Secondary | ICD-10-CM | POA: Diagnosis not present

## 2014-10-29 DIAGNOSIS — E1140 Type 2 diabetes mellitus with diabetic neuropathy, unspecified: Secondary | ICD-10-CM | POA: Diagnosis not present

## 2014-10-29 DIAGNOSIS — R921 Mammographic calcification found on diagnostic imaging of breast: Secondary | ICD-10-CM | POA: Diagnosis not present

## 2014-10-29 DIAGNOSIS — Z794 Long term (current) use of insulin: Secondary | ICD-10-CM | POA: Diagnosis not present

## 2014-10-29 DIAGNOSIS — I878 Other specified disorders of veins: Secondary | ICD-10-CM | POA: Diagnosis not present

## 2014-10-29 DIAGNOSIS — R928 Other abnormal and inconclusive findings on diagnostic imaging of breast: Secondary | ICD-10-CM | POA: Diagnosis not present

## 2014-10-29 DIAGNOSIS — I10 Essential (primary) hypertension: Secondary | ICD-10-CM | POA: Diagnosis not present

## 2014-10-29 DIAGNOSIS — M069 Rheumatoid arthritis, unspecified: Secondary | ICD-10-CM | POA: Diagnosis not present

## 2014-10-29 DIAGNOSIS — A429 Actinomycosis, unspecified: Secondary | ICD-10-CM | POA: Diagnosis not present

## 2014-10-29 DIAGNOSIS — M0589 Other rheumatoid arthritis with rheumatoid factor of multiple sites: Secondary | ICD-10-CM | POA: Diagnosis not present

## 2014-10-30 DIAGNOSIS — I87312 Chronic venous hypertension (idiopathic) with ulcer of left lower extremity: Secondary | ICD-10-CM | POA: Diagnosis not present

## 2014-10-30 DIAGNOSIS — E11622 Type 2 diabetes mellitus with other skin ulcer: Secondary | ICD-10-CM | POA: Diagnosis not present

## 2014-10-30 DIAGNOSIS — L97821 Non-pressure chronic ulcer of other part of left lower leg limited to breakdown of skin: Secondary | ICD-10-CM | POA: Diagnosis not present

## 2014-11-07 DIAGNOSIS — L97222 Non-pressure chronic ulcer of left calf with fat layer exposed: Secondary | ICD-10-CM | POA: Diagnosis not present

## 2014-11-07 DIAGNOSIS — E119 Type 2 diabetes mellitus without complications: Secondary | ICD-10-CM | POA: Diagnosis not present

## 2014-11-07 DIAGNOSIS — E11622 Type 2 diabetes mellitus with other skin ulcer: Secondary | ICD-10-CM | POA: Diagnosis not present

## 2014-11-07 DIAGNOSIS — I87312 Chronic venous hypertension (idiopathic) with ulcer of left lower extremity: Secondary | ICD-10-CM | POA: Diagnosis not present

## 2014-11-07 DIAGNOSIS — L97821 Non-pressure chronic ulcer of other part of left lower leg limited to breakdown of skin: Secondary | ICD-10-CM | POA: Diagnosis not present

## 2014-11-13 DIAGNOSIS — L97821 Non-pressure chronic ulcer of other part of left lower leg limited to breakdown of skin: Secondary | ICD-10-CM | POA: Diagnosis not present

## 2014-11-13 DIAGNOSIS — E11622 Type 2 diabetes mellitus with other skin ulcer: Secondary | ICD-10-CM | POA: Diagnosis not present

## 2014-11-13 DIAGNOSIS — I87312 Chronic venous hypertension (idiopathic) with ulcer of left lower extremity: Secondary | ICD-10-CM | POA: Diagnosis not present

## 2014-11-20 DIAGNOSIS — I87312 Chronic venous hypertension (idiopathic) with ulcer of left lower extremity: Secondary | ICD-10-CM | POA: Diagnosis not present

## 2014-11-20 DIAGNOSIS — L97821 Non-pressure chronic ulcer of other part of left lower leg limited to breakdown of skin: Secondary | ICD-10-CM | POA: Diagnosis not present

## 2014-11-20 DIAGNOSIS — E11622 Type 2 diabetes mellitus with other skin ulcer: Secondary | ICD-10-CM | POA: Diagnosis not present

## 2014-11-26 DIAGNOSIS — L97821 Non-pressure chronic ulcer of other part of left lower leg limited to breakdown of skin: Secondary | ICD-10-CM | POA: Diagnosis not present

## 2014-11-26 DIAGNOSIS — I87312 Chronic venous hypertension (idiopathic) with ulcer of left lower extremity: Secondary | ICD-10-CM | POA: Diagnosis not present

## 2014-11-26 DIAGNOSIS — E11622 Type 2 diabetes mellitus with other skin ulcer: Secondary | ICD-10-CM | POA: Diagnosis not present

## 2014-11-29 DIAGNOSIS — N3000 Acute cystitis without hematuria: Secondary | ICD-10-CM | POA: Diagnosis not present

## 2014-11-29 DIAGNOSIS — N3090 Cystitis, unspecified without hematuria: Secondary | ICD-10-CM | POA: Diagnosis not present

## 2014-12-04 DIAGNOSIS — E11622 Type 2 diabetes mellitus with other skin ulcer: Secondary | ICD-10-CM | POA: Diagnosis not present

## 2014-12-04 DIAGNOSIS — I87312 Chronic venous hypertension (idiopathic) with ulcer of left lower extremity: Secondary | ICD-10-CM | POA: Diagnosis not present

## 2014-12-04 DIAGNOSIS — L97821 Non-pressure chronic ulcer of other part of left lower leg limited to breakdown of skin: Secondary | ICD-10-CM | POA: Diagnosis not present

## 2014-12-11 DIAGNOSIS — E11622 Type 2 diabetes mellitus with other skin ulcer: Secondary | ICD-10-CM | POA: Diagnosis not present

## 2014-12-11 DIAGNOSIS — I87312 Chronic venous hypertension (idiopathic) with ulcer of left lower extremity: Secondary | ICD-10-CM | POA: Diagnosis not present

## 2014-12-11 DIAGNOSIS — E119 Type 2 diabetes mellitus without complications: Secondary | ICD-10-CM | POA: Diagnosis not present

## 2014-12-11 DIAGNOSIS — L97821 Non-pressure chronic ulcer of other part of left lower leg limited to breakdown of skin: Secondary | ICD-10-CM | POA: Diagnosis not present

## 2014-12-11 DIAGNOSIS — L97222 Non-pressure chronic ulcer of left calf with fat layer exposed: Secondary | ICD-10-CM | POA: Diagnosis not present

## 2014-12-17 DIAGNOSIS — M0570 Rheumatoid arthritis with rheumatoid factor of unspecified site without organ or systems involvement: Secondary | ICD-10-CM | POA: Diagnosis not present

## 2014-12-17 DIAGNOSIS — M5030 Other cervical disc degeneration, unspecified cervical region: Secondary | ICD-10-CM | POA: Diagnosis not present

## 2014-12-17 DIAGNOSIS — Z6829 Body mass index (BMI) 29.0-29.9, adult: Secondary | ICD-10-CM | POA: Diagnosis not present

## 2014-12-17 DIAGNOSIS — G5790 Unspecified mononeuropathy of unspecified lower limb: Secondary | ICD-10-CM | POA: Diagnosis not present

## 2014-12-18 DIAGNOSIS — E119 Type 2 diabetes mellitus without complications: Secondary | ICD-10-CM | POA: Diagnosis not present

## 2014-12-18 DIAGNOSIS — L97519 Non-pressure chronic ulcer of other part of right foot with unspecified severity: Secondary | ICD-10-CM | POA: Diagnosis not present

## 2014-12-18 DIAGNOSIS — E1165 Type 2 diabetes mellitus with hyperglycemia: Secondary | ICD-10-CM | POA: Diagnosis not present

## 2014-12-18 DIAGNOSIS — L97222 Non-pressure chronic ulcer of left calf with fat layer exposed: Secondary | ICD-10-CM | POA: Diagnosis not present

## 2014-12-18 DIAGNOSIS — I87312 Chronic venous hypertension (idiopathic) with ulcer of left lower extremity: Secondary | ICD-10-CM | POA: Diagnosis not present

## 2014-12-18 DIAGNOSIS — E1140 Type 2 diabetes mellitus with diabetic neuropathy, unspecified: Secondary | ICD-10-CM | POA: Diagnosis not present

## 2014-12-18 DIAGNOSIS — Z0000 Encounter for general adult medical examination without abnormal findings: Secondary | ICD-10-CM | POA: Diagnosis not present

## 2014-12-18 DIAGNOSIS — E11621 Type 2 diabetes mellitus with foot ulcer: Secondary | ICD-10-CM | POA: Diagnosis not present

## 2014-12-18 DIAGNOSIS — L97821 Non-pressure chronic ulcer of other part of left lower leg limited to breakdown of skin: Secondary | ICD-10-CM | POA: Diagnosis not present

## 2014-12-18 DIAGNOSIS — E11622 Type 2 diabetes mellitus with other skin ulcer: Secondary | ICD-10-CM | POA: Diagnosis not present

## 2014-12-24 DIAGNOSIS — L97222 Non-pressure chronic ulcer of left calf with fat layer exposed: Secondary | ICD-10-CM | POA: Diagnosis not present

## 2014-12-24 DIAGNOSIS — I87312 Chronic venous hypertension (idiopathic) with ulcer of left lower extremity: Secondary | ICD-10-CM | POA: Diagnosis not present

## 2014-12-24 DIAGNOSIS — E11622 Type 2 diabetes mellitus with other skin ulcer: Secondary | ICD-10-CM | POA: Diagnosis not present

## 2014-12-24 DIAGNOSIS — E119 Type 2 diabetes mellitus without complications: Secondary | ICD-10-CM | POA: Diagnosis not present

## 2014-12-24 DIAGNOSIS — L97821 Non-pressure chronic ulcer of other part of left lower leg limited to breakdown of skin: Secondary | ICD-10-CM | POA: Diagnosis not present

## 2015-01-01 DIAGNOSIS — L97222 Non-pressure chronic ulcer of left calf with fat layer exposed: Secondary | ICD-10-CM | POA: Diagnosis not present

## 2015-01-01 DIAGNOSIS — L97821 Non-pressure chronic ulcer of other part of left lower leg limited to breakdown of skin: Secondary | ICD-10-CM | POA: Diagnosis not present

## 2015-01-01 DIAGNOSIS — L97811 Non-pressure chronic ulcer of other part of right lower leg limited to breakdown of skin: Secondary | ICD-10-CM | POA: Diagnosis not present

## 2015-01-01 DIAGNOSIS — I87312 Chronic venous hypertension (idiopathic) with ulcer of left lower extremity: Secondary | ICD-10-CM | POA: Diagnosis not present

## 2015-01-01 DIAGNOSIS — E11622 Type 2 diabetes mellitus with other skin ulcer: Secondary | ICD-10-CM | POA: Diagnosis not present

## 2015-01-07 DIAGNOSIS — Z6829 Body mass index (BMI) 29.0-29.9, adult: Secondary | ICD-10-CM | POA: Diagnosis not present

## 2015-01-07 DIAGNOSIS — M0570 Rheumatoid arthritis with rheumatoid factor of unspecified site without organ or systems involvement: Secondary | ICD-10-CM | POA: Diagnosis not present

## 2015-01-08 DIAGNOSIS — L97222 Non-pressure chronic ulcer of left calf with fat layer exposed: Secondary | ICD-10-CM | POA: Diagnosis not present

## 2015-01-08 DIAGNOSIS — I87312 Chronic venous hypertension (idiopathic) with ulcer of left lower extremity: Secondary | ICD-10-CM | POA: Diagnosis not present

## 2015-01-08 DIAGNOSIS — E11622 Type 2 diabetes mellitus with other skin ulcer: Secondary | ICD-10-CM | POA: Diagnosis not present

## 2015-01-08 DIAGNOSIS — L97811 Non-pressure chronic ulcer of other part of right lower leg limited to breakdown of skin: Secondary | ICD-10-CM | POA: Diagnosis not present

## 2015-01-08 DIAGNOSIS — L97821 Non-pressure chronic ulcer of other part of left lower leg limited to breakdown of skin: Secondary | ICD-10-CM | POA: Diagnosis not present

## 2015-01-10 DIAGNOSIS — M0570 Rheumatoid arthritis with rheumatoid factor of unspecified site without organ or systems involvement: Secondary | ICD-10-CM | POA: Diagnosis not present

## 2015-01-13 DIAGNOSIS — M069 Rheumatoid arthritis, unspecified: Secondary | ICD-10-CM | POA: Diagnosis not present

## 2015-01-13 DIAGNOSIS — L97222 Non-pressure chronic ulcer of left calf with fat layer exposed: Secondary | ICD-10-CM | POA: Diagnosis not present

## 2015-01-13 DIAGNOSIS — I10 Essential (primary) hypertension: Secondary | ICD-10-CM | POA: Diagnosis not present

## 2015-01-13 DIAGNOSIS — E1140 Type 2 diabetes mellitus with diabetic neuropathy, unspecified: Secondary | ICD-10-CM | POA: Diagnosis not present

## 2015-01-13 DIAGNOSIS — Z7952 Long term (current) use of systemic steroids: Secondary | ICD-10-CM | POA: Diagnosis not present

## 2015-01-13 DIAGNOSIS — Z86718 Personal history of other venous thrombosis and embolism: Secondary | ICD-10-CM | POA: Diagnosis not present

## 2015-01-13 DIAGNOSIS — E11622 Type 2 diabetes mellitus with other skin ulcer: Secondary | ICD-10-CM | POA: Diagnosis not present

## 2015-01-13 DIAGNOSIS — Z79891 Long term (current) use of opiate analgesic: Secondary | ICD-10-CM | POA: Diagnosis not present

## 2015-01-13 DIAGNOSIS — I87312 Chronic venous hypertension (idiopathic) with ulcer of left lower extremity: Secondary | ICD-10-CM | POA: Diagnosis not present

## 2015-01-13 DIAGNOSIS — L97212 Non-pressure chronic ulcer of right calf with fat layer exposed: Secondary | ICD-10-CM | POA: Diagnosis not present

## 2015-01-15 DIAGNOSIS — E11622 Type 2 diabetes mellitus with other skin ulcer: Secondary | ICD-10-CM | POA: Diagnosis not present

## 2015-01-15 DIAGNOSIS — L97222 Non-pressure chronic ulcer of left calf with fat layer exposed: Secondary | ICD-10-CM | POA: Diagnosis not present

## 2015-01-15 DIAGNOSIS — L97811 Non-pressure chronic ulcer of other part of right lower leg limited to breakdown of skin: Secondary | ICD-10-CM | POA: Diagnosis not present

## 2015-01-15 DIAGNOSIS — L97821 Non-pressure chronic ulcer of other part of left lower leg limited to breakdown of skin: Secondary | ICD-10-CM | POA: Diagnosis not present

## 2015-01-15 DIAGNOSIS — I87312 Chronic venous hypertension (idiopathic) with ulcer of left lower extremity: Secondary | ICD-10-CM | POA: Diagnosis not present

## 2015-01-17 DIAGNOSIS — E1140 Type 2 diabetes mellitus with diabetic neuropathy, unspecified: Secondary | ICD-10-CM | POA: Diagnosis not present

## 2015-01-17 DIAGNOSIS — L97212 Non-pressure chronic ulcer of right calf with fat layer exposed: Secondary | ICD-10-CM | POA: Diagnosis not present

## 2015-01-17 DIAGNOSIS — I87312 Chronic venous hypertension (idiopathic) with ulcer of left lower extremity: Secondary | ICD-10-CM | POA: Diagnosis not present

## 2015-01-17 DIAGNOSIS — I10 Essential (primary) hypertension: Secondary | ICD-10-CM | POA: Diagnosis not present

## 2015-01-17 DIAGNOSIS — E11622 Type 2 diabetes mellitus with other skin ulcer: Secondary | ICD-10-CM | POA: Diagnosis not present

## 2015-01-17 DIAGNOSIS — L97222 Non-pressure chronic ulcer of left calf with fat layer exposed: Secondary | ICD-10-CM | POA: Diagnosis not present

## 2015-01-20 DIAGNOSIS — L97222 Non-pressure chronic ulcer of left calf with fat layer exposed: Secondary | ICD-10-CM | POA: Diagnosis not present

## 2015-01-20 DIAGNOSIS — L97212 Non-pressure chronic ulcer of right calf with fat layer exposed: Secondary | ICD-10-CM | POA: Diagnosis not present

## 2015-01-20 DIAGNOSIS — I10 Essential (primary) hypertension: Secondary | ICD-10-CM | POA: Diagnosis not present

## 2015-01-20 DIAGNOSIS — I87312 Chronic venous hypertension (idiopathic) with ulcer of left lower extremity: Secondary | ICD-10-CM | POA: Diagnosis not present

## 2015-01-20 DIAGNOSIS — E11622 Type 2 diabetes mellitus with other skin ulcer: Secondary | ICD-10-CM | POA: Diagnosis not present

## 2015-01-20 DIAGNOSIS — E1140 Type 2 diabetes mellitus with diabetic neuropathy, unspecified: Secondary | ICD-10-CM | POA: Diagnosis not present

## 2015-01-21 DIAGNOSIS — E119 Type 2 diabetes mellitus without complications: Secondary | ICD-10-CM | POA: Diagnosis not present

## 2015-01-21 DIAGNOSIS — H524 Presbyopia: Secondary | ICD-10-CM | POA: Diagnosis not present

## 2015-01-21 DIAGNOSIS — R0789 Other chest pain: Secondary | ICD-10-CM | POA: Diagnosis not present

## 2015-01-21 DIAGNOSIS — H25812 Combined forms of age-related cataract, left eye: Secondary | ICD-10-CM | POA: Diagnosis not present

## 2015-01-21 DIAGNOSIS — H26491 Other secondary cataract, right eye: Secondary | ICD-10-CM | POA: Diagnosis not present

## 2015-01-22 DIAGNOSIS — L97821 Non-pressure chronic ulcer of other part of left lower leg limited to breakdown of skin: Secondary | ICD-10-CM | POA: Diagnosis not present

## 2015-01-22 DIAGNOSIS — I87312 Chronic venous hypertension (idiopathic) with ulcer of left lower extremity: Secondary | ICD-10-CM | POA: Diagnosis not present

## 2015-01-22 DIAGNOSIS — E11622 Type 2 diabetes mellitus with other skin ulcer: Secondary | ICD-10-CM | POA: Diagnosis not present

## 2015-01-22 DIAGNOSIS — L97222 Non-pressure chronic ulcer of left calf with fat layer exposed: Secondary | ICD-10-CM | POA: Diagnosis not present

## 2015-01-22 DIAGNOSIS — L97811 Non-pressure chronic ulcer of other part of right lower leg limited to breakdown of skin: Secondary | ICD-10-CM | POA: Diagnosis not present

## 2015-01-24 DIAGNOSIS — L97212 Non-pressure chronic ulcer of right calf with fat layer exposed: Secondary | ICD-10-CM | POA: Diagnosis not present

## 2015-01-24 DIAGNOSIS — I87312 Chronic venous hypertension (idiopathic) with ulcer of left lower extremity: Secondary | ICD-10-CM | POA: Diagnosis not present

## 2015-01-24 DIAGNOSIS — E1140 Type 2 diabetes mellitus with diabetic neuropathy, unspecified: Secondary | ICD-10-CM | POA: Diagnosis not present

## 2015-01-24 DIAGNOSIS — E11622 Type 2 diabetes mellitus with other skin ulcer: Secondary | ICD-10-CM | POA: Diagnosis not present

## 2015-01-24 DIAGNOSIS — L97222 Non-pressure chronic ulcer of left calf with fat layer exposed: Secondary | ICD-10-CM | POA: Diagnosis not present

## 2015-01-24 DIAGNOSIS — I10 Essential (primary) hypertension: Secondary | ICD-10-CM | POA: Diagnosis not present

## 2015-01-27 DIAGNOSIS — E1140 Type 2 diabetes mellitus with diabetic neuropathy, unspecified: Secondary | ICD-10-CM | POA: Diagnosis not present

## 2015-01-27 DIAGNOSIS — I87312 Chronic venous hypertension (idiopathic) with ulcer of left lower extremity: Secondary | ICD-10-CM | POA: Diagnosis not present

## 2015-01-27 DIAGNOSIS — E11622 Type 2 diabetes mellitus with other skin ulcer: Secondary | ICD-10-CM | POA: Diagnosis not present

## 2015-01-27 DIAGNOSIS — L97222 Non-pressure chronic ulcer of left calf with fat layer exposed: Secondary | ICD-10-CM | POA: Diagnosis not present

## 2015-01-27 DIAGNOSIS — I10 Essential (primary) hypertension: Secondary | ICD-10-CM | POA: Diagnosis not present

## 2015-01-27 DIAGNOSIS — L97212 Non-pressure chronic ulcer of right calf with fat layer exposed: Secondary | ICD-10-CM | POA: Diagnosis not present

## 2015-01-29 DIAGNOSIS — L97811 Non-pressure chronic ulcer of other part of right lower leg limited to breakdown of skin: Secondary | ICD-10-CM | POA: Diagnosis not present

## 2015-01-29 DIAGNOSIS — E11622 Type 2 diabetes mellitus with other skin ulcer: Secondary | ICD-10-CM | POA: Diagnosis not present

## 2015-01-29 DIAGNOSIS — I87312 Chronic venous hypertension (idiopathic) with ulcer of left lower extremity: Secondary | ICD-10-CM | POA: Diagnosis not present

## 2015-01-29 DIAGNOSIS — L97222 Non-pressure chronic ulcer of left calf with fat layer exposed: Secondary | ICD-10-CM | POA: Diagnosis not present

## 2015-01-29 DIAGNOSIS — I87311 Chronic venous hypertension (idiopathic) with ulcer of right lower extremity: Secondary | ICD-10-CM | POA: Diagnosis not present

## 2015-01-31 DIAGNOSIS — I87312 Chronic venous hypertension (idiopathic) with ulcer of left lower extremity: Secondary | ICD-10-CM | POA: Diagnosis not present

## 2015-01-31 DIAGNOSIS — E1140 Type 2 diabetes mellitus with diabetic neuropathy, unspecified: Secondary | ICD-10-CM | POA: Diagnosis not present

## 2015-01-31 DIAGNOSIS — L97222 Non-pressure chronic ulcer of left calf with fat layer exposed: Secondary | ICD-10-CM | POA: Diagnosis not present

## 2015-01-31 DIAGNOSIS — L97212 Non-pressure chronic ulcer of right calf with fat layer exposed: Secondary | ICD-10-CM | POA: Diagnosis not present

## 2015-01-31 DIAGNOSIS — I10 Essential (primary) hypertension: Secondary | ICD-10-CM | POA: Diagnosis not present

## 2015-01-31 DIAGNOSIS — E11622 Type 2 diabetes mellitus with other skin ulcer: Secondary | ICD-10-CM | POA: Diagnosis not present

## 2015-02-03 DIAGNOSIS — L97212 Non-pressure chronic ulcer of right calf with fat layer exposed: Secondary | ICD-10-CM | POA: Diagnosis not present

## 2015-02-03 DIAGNOSIS — E1140 Type 2 diabetes mellitus with diabetic neuropathy, unspecified: Secondary | ICD-10-CM | POA: Diagnosis not present

## 2015-02-03 DIAGNOSIS — I10 Essential (primary) hypertension: Secondary | ICD-10-CM | POA: Diagnosis not present

## 2015-02-03 DIAGNOSIS — E11622 Type 2 diabetes mellitus with other skin ulcer: Secondary | ICD-10-CM | POA: Diagnosis not present

## 2015-02-03 DIAGNOSIS — I87312 Chronic venous hypertension (idiopathic) with ulcer of left lower extremity: Secondary | ICD-10-CM | POA: Diagnosis not present

## 2015-02-03 DIAGNOSIS — L97222 Non-pressure chronic ulcer of left calf with fat layer exposed: Secondary | ICD-10-CM | POA: Diagnosis not present

## 2015-02-04 DIAGNOSIS — L97811 Non-pressure chronic ulcer of other part of right lower leg limited to breakdown of skin: Secondary | ICD-10-CM | POA: Diagnosis not present

## 2015-02-04 DIAGNOSIS — E11622 Type 2 diabetes mellitus with other skin ulcer: Secondary | ICD-10-CM | POA: Diagnosis not present

## 2015-02-04 DIAGNOSIS — L97821 Non-pressure chronic ulcer of other part of left lower leg limited to breakdown of skin: Secondary | ICD-10-CM | POA: Diagnosis not present

## 2015-02-04 DIAGNOSIS — I87312 Chronic venous hypertension (idiopathic) with ulcer of left lower extremity: Secondary | ICD-10-CM | POA: Diagnosis not present

## 2015-02-04 DIAGNOSIS — L97222 Non-pressure chronic ulcer of left calf with fat layer exposed: Secondary | ICD-10-CM | POA: Diagnosis not present

## 2015-02-05 DIAGNOSIS — Z6829 Body mass index (BMI) 29.0-29.9, adult: Secondary | ICD-10-CM | POA: Diagnosis not present

## 2015-02-05 DIAGNOSIS — M5030 Other cervical disc degeneration, unspecified cervical region: Secondary | ICD-10-CM | POA: Diagnosis not present

## 2015-02-05 DIAGNOSIS — M0570 Rheumatoid arthritis with rheumatoid factor of unspecified site without organ or systems involvement: Secondary | ICD-10-CM | POA: Diagnosis not present

## 2015-02-05 DIAGNOSIS — G5790 Unspecified mononeuropathy of unspecified lower limb: Secondary | ICD-10-CM | POA: Diagnosis not present

## 2015-02-07 DIAGNOSIS — E11622 Type 2 diabetes mellitus with other skin ulcer: Secondary | ICD-10-CM | POA: Diagnosis not present

## 2015-02-07 DIAGNOSIS — L97222 Non-pressure chronic ulcer of left calf with fat layer exposed: Secondary | ICD-10-CM | POA: Diagnosis not present

## 2015-02-07 DIAGNOSIS — I10 Essential (primary) hypertension: Secondary | ICD-10-CM | POA: Diagnosis not present

## 2015-02-07 DIAGNOSIS — L97212 Non-pressure chronic ulcer of right calf with fat layer exposed: Secondary | ICD-10-CM | POA: Diagnosis not present

## 2015-02-07 DIAGNOSIS — Z23 Encounter for immunization: Secondary | ICD-10-CM | POA: Diagnosis not present

## 2015-02-07 DIAGNOSIS — E1140 Type 2 diabetes mellitus with diabetic neuropathy, unspecified: Secondary | ICD-10-CM | POA: Diagnosis not present

## 2015-02-07 DIAGNOSIS — I87312 Chronic venous hypertension (idiopathic) with ulcer of left lower extremity: Secondary | ICD-10-CM | POA: Diagnosis not present

## 2015-02-10 DIAGNOSIS — E1140 Type 2 diabetes mellitus with diabetic neuropathy, unspecified: Secondary | ICD-10-CM | POA: Diagnosis not present

## 2015-02-10 DIAGNOSIS — E11622 Type 2 diabetes mellitus with other skin ulcer: Secondary | ICD-10-CM | POA: Diagnosis not present

## 2015-02-10 DIAGNOSIS — I10 Essential (primary) hypertension: Secondary | ICD-10-CM | POA: Diagnosis not present

## 2015-02-10 DIAGNOSIS — L97222 Non-pressure chronic ulcer of left calf with fat layer exposed: Secondary | ICD-10-CM | POA: Diagnosis not present

## 2015-02-10 DIAGNOSIS — I87312 Chronic venous hypertension (idiopathic) with ulcer of left lower extremity: Secondary | ICD-10-CM | POA: Diagnosis not present

## 2015-02-10 DIAGNOSIS — L97212 Non-pressure chronic ulcer of right calf with fat layer exposed: Secondary | ICD-10-CM | POA: Diagnosis not present

## 2015-02-12 DIAGNOSIS — E11622 Type 2 diabetes mellitus with other skin ulcer: Secondary | ICD-10-CM | POA: Diagnosis not present

## 2015-02-12 DIAGNOSIS — I87312 Chronic venous hypertension (idiopathic) with ulcer of left lower extremity: Secondary | ICD-10-CM | POA: Diagnosis not present

## 2015-02-12 DIAGNOSIS — L97821 Non-pressure chronic ulcer of other part of left lower leg limited to breakdown of skin: Secondary | ICD-10-CM | POA: Diagnosis not present

## 2015-02-12 DIAGNOSIS — N3000 Acute cystitis without hematuria: Secondary | ICD-10-CM | POA: Diagnosis not present

## 2015-02-12 DIAGNOSIS — L97811 Non-pressure chronic ulcer of other part of right lower leg limited to breakdown of skin: Secondary | ICD-10-CM | POA: Diagnosis not present

## 2015-02-12 DIAGNOSIS — L97222 Non-pressure chronic ulcer of left calf with fat layer exposed: Secondary | ICD-10-CM | POA: Diagnosis not present

## 2015-02-13 DIAGNOSIS — M5430 Sciatica, unspecified side: Secondary | ICD-10-CM | POA: Diagnosis not present

## 2015-02-14 DIAGNOSIS — L97222 Non-pressure chronic ulcer of left calf with fat layer exposed: Secondary | ICD-10-CM | POA: Diagnosis not present

## 2015-02-14 DIAGNOSIS — E11622 Type 2 diabetes mellitus with other skin ulcer: Secondary | ICD-10-CM | POA: Diagnosis not present

## 2015-02-14 DIAGNOSIS — E1140 Type 2 diabetes mellitus with diabetic neuropathy, unspecified: Secondary | ICD-10-CM | POA: Diagnosis not present

## 2015-02-14 DIAGNOSIS — I10 Essential (primary) hypertension: Secondary | ICD-10-CM | POA: Diagnosis not present

## 2015-02-14 DIAGNOSIS — I87312 Chronic venous hypertension (idiopathic) with ulcer of left lower extremity: Secondary | ICD-10-CM | POA: Diagnosis not present

## 2015-02-14 DIAGNOSIS — L97212 Non-pressure chronic ulcer of right calf with fat layer exposed: Secondary | ICD-10-CM | POA: Diagnosis not present

## 2015-02-17 DIAGNOSIS — L97212 Non-pressure chronic ulcer of right calf with fat layer exposed: Secondary | ICD-10-CM | POA: Diagnosis not present

## 2015-02-17 DIAGNOSIS — E11622 Type 2 diabetes mellitus with other skin ulcer: Secondary | ICD-10-CM | POA: Diagnosis not present

## 2015-02-17 DIAGNOSIS — L97222 Non-pressure chronic ulcer of left calf with fat layer exposed: Secondary | ICD-10-CM | POA: Diagnosis not present

## 2015-02-17 DIAGNOSIS — E1140 Type 2 diabetes mellitus with diabetic neuropathy, unspecified: Secondary | ICD-10-CM | POA: Diagnosis not present

## 2015-02-17 DIAGNOSIS — I87312 Chronic venous hypertension (idiopathic) with ulcer of left lower extremity: Secondary | ICD-10-CM | POA: Diagnosis not present

## 2015-02-17 DIAGNOSIS — I10 Essential (primary) hypertension: Secondary | ICD-10-CM | POA: Diagnosis not present

## 2015-02-19 DIAGNOSIS — I87312 Chronic venous hypertension (idiopathic) with ulcer of left lower extremity: Secondary | ICD-10-CM | POA: Diagnosis not present

## 2015-02-19 DIAGNOSIS — L97821 Non-pressure chronic ulcer of other part of left lower leg limited to breakdown of skin: Secondary | ICD-10-CM | POA: Diagnosis not present

## 2015-02-19 DIAGNOSIS — L97222 Non-pressure chronic ulcer of left calf with fat layer exposed: Secondary | ICD-10-CM | POA: Diagnosis not present

## 2015-02-19 DIAGNOSIS — E11622 Type 2 diabetes mellitus with other skin ulcer: Secondary | ICD-10-CM | POA: Diagnosis not present

## 2015-02-19 DIAGNOSIS — L97811 Non-pressure chronic ulcer of other part of right lower leg limited to breakdown of skin: Secondary | ICD-10-CM | POA: Diagnosis not present

## 2015-02-20 DIAGNOSIS — M0579 Rheumatoid arthritis with rheumatoid factor of multiple sites without organ or systems involvement: Secondary | ICD-10-CM | POA: Diagnosis not present

## 2015-02-20 DIAGNOSIS — L97929 Non-pressure chronic ulcer of unspecified part of left lower leg with unspecified severity: Secondary | ICD-10-CM | POA: Diagnosis not present

## 2015-02-20 DIAGNOSIS — M8580 Other specified disorders of bone density and structure, unspecified site: Secondary | ICD-10-CM | POA: Diagnosis not present

## 2015-02-20 DIAGNOSIS — G5600 Carpal tunnel syndrome, unspecified upper limb: Secondary | ICD-10-CM | POA: Diagnosis not present

## 2015-02-20 DIAGNOSIS — E1122 Type 2 diabetes mellitus with diabetic chronic kidney disease: Secondary | ICD-10-CM | POA: Diagnosis not present

## 2015-02-20 DIAGNOSIS — M1990 Unspecified osteoarthritis, unspecified site: Secondary | ICD-10-CM | POA: Diagnosis not present

## 2015-02-20 DIAGNOSIS — M069 Rheumatoid arthritis, unspecified: Secondary | ICD-10-CM | POA: Diagnosis not present

## 2015-02-20 DIAGNOSIS — K219 Gastro-esophageal reflux disease without esophagitis: Secondary | ICD-10-CM | POA: Diagnosis not present

## 2015-02-20 DIAGNOSIS — L97919 Non-pressure chronic ulcer of unspecified part of right lower leg with unspecified severity: Secondary | ICD-10-CM | POA: Diagnosis not present

## 2015-02-20 DIAGNOSIS — L97229 Non-pressure chronic ulcer of left calf with unspecified severity: Secondary | ICD-10-CM | POA: Diagnosis not present

## 2015-02-20 DIAGNOSIS — G5623 Lesion of ulnar nerve, bilateral upper limbs: Secondary | ICD-10-CM | POA: Diagnosis not present

## 2015-02-20 DIAGNOSIS — Z5181 Encounter for therapeutic drug level monitoring: Secondary | ICD-10-CM | POA: Diagnosis not present

## 2015-02-20 DIAGNOSIS — N183 Chronic kidney disease, stage 3 (moderate): Secondary | ICD-10-CM | POA: Diagnosis not present

## 2015-02-21 DIAGNOSIS — E11622 Type 2 diabetes mellitus with other skin ulcer: Secondary | ICD-10-CM | POA: Diagnosis not present

## 2015-02-21 DIAGNOSIS — I87312 Chronic venous hypertension (idiopathic) with ulcer of left lower extremity: Secondary | ICD-10-CM | POA: Diagnosis not present

## 2015-02-21 DIAGNOSIS — L97212 Non-pressure chronic ulcer of right calf with fat layer exposed: Secondary | ICD-10-CM | POA: Diagnosis not present

## 2015-02-21 DIAGNOSIS — E1140 Type 2 diabetes mellitus with diabetic neuropathy, unspecified: Secondary | ICD-10-CM | POA: Diagnosis not present

## 2015-02-21 DIAGNOSIS — L97222 Non-pressure chronic ulcer of left calf with fat layer exposed: Secondary | ICD-10-CM | POA: Diagnosis not present

## 2015-02-21 DIAGNOSIS — I10 Essential (primary) hypertension: Secondary | ICD-10-CM | POA: Diagnosis not present

## 2015-02-24 DIAGNOSIS — I10 Essential (primary) hypertension: Secondary | ICD-10-CM | POA: Diagnosis not present

## 2015-02-24 DIAGNOSIS — E1140 Type 2 diabetes mellitus with diabetic neuropathy, unspecified: Secondary | ICD-10-CM | POA: Diagnosis not present

## 2015-02-24 DIAGNOSIS — I87312 Chronic venous hypertension (idiopathic) with ulcer of left lower extremity: Secondary | ICD-10-CM | POA: Diagnosis not present

## 2015-02-24 DIAGNOSIS — N3090 Cystitis, unspecified without hematuria: Secondary | ICD-10-CM | POA: Diagnosis not present

## 2015-02-24 DIAGNOSIS — L97212 Non-pressure chronic ulcer of right calf with fat layer exposed: Secondary | ICD-10-CM | POA: Diagnosis not present

## 2015-02-24 DIAGNOSIS — E11622 Type 2 diabetes mellitus with other skin ulcer: Secondary | ICD-10-CM | POA: Diagnosis not present

## 2015-02-24 DIAGNOSIS — L97222 Non-pressure chronic ulcer of left calf with fat layer exposed: Secondary | ICD-10-CM | POA: Diagnosis not present

## 2015-02-26 DIAGNOSIS — E11622 Type 2 diabetes mellitus with other skin ulcer: Secondary | ICD-10-CM | POA: Diagnosis not present

## 2015-02-26 DIAGNOSIS — I87312 Chronic venous hypertension (idiopathic) with ulcer of left lower extremity: Secondary | ICD-10-CM | POA: Diagnosis not present

## 2015-02-26 DIAGNOSIS — L97222 Non-pressure chronic ulcer of left calf with fat layer exposed: Secondary | ICD-10-CM | POA: Diagnosis not present

## 2015-02-26 DIAGNOSIS — L97811 Non-pressure chronic ulcer of other part of right lower leg limited to breakdown of skin: Secondary | ICD-10-CM | POA: Diagnosis not present

## 2015-02-26 DIAGNOSIS — L97821 Non-pressure chronic ulcer of other part of left lower leg limited to breakdown of skin: Secondary | ICD-10-CM | POA: Diagnosis not present

## 2015-02-28 DIAGNOSIS — H04123 Dry eye syndrome of bilateral lacrimal glands: Secondary | ICD-10-CM | POA: Diagnosis not present

## 2015-02-28 DIAGNOSIS — H52223 Regular astigmatism, bilateral: Secondary | ICD-10-CM | POA: Diagnosis not present

## 2015-02-28 DIAGNOSIS — M0579 Rheumatoid arthritis with rheumatoid factor of multiple sites without organ or systems involvement: Secondary | ICD-10-CM | POA: Diagnosis not present

## 2015-02-28 DIAGNOSIS — E11622 Type 2 diabetes mellitus with other skin ulcer: Secondary | ICD-10-CM | POA: Diagnosis not present

## 2015-02-28 DIAGNOSIS — E1140 Type 2 diabetes mellitus with diabetic neuropathy, unspecified: Secondary | ICD-10-CM | POA: Diagnosis not present

## 2015-02-28 DIAGNOSIS — E119 Type 2 diabetes mellitus without complications: Secondary | ICD-10-CM | POA: Diagnosis not present

## 2015-02-28 DIAGNOSIS — I10 Essential (primary) hypertension: Secondary | ICD-10-CM | POA: Diagnosis not present

## 2015-02-28 DIAGNOSIS — I87312 Chronic venous hypertension (idiopathic) with ulcer of left lower extremity: Secondary | ICD-10-CM | POA: Diagnosis not present

## 2015-02-28 DIAGNOSIS — L97212 Non-pressure chronic ulcer of right calf with fat layer exposed: Secondary | ICD-10-CM | POA: Diagnosis not present

## 2015-02-28 DIAGNOSIS — L97222 Non-pressure chronic ulcer of left calf with fat layer exposed: Secondary | ICD-10-CM | POA: Diagnosis not present

## 2015-02-28 DIAGNOSIS — H25812 Combined forms of age-related cataract, left eye: Secondary | ICD-10-CM | POA: Diagnosis not present

## 2015-02-28 DIAGNOSIS — H524 Presbyopia: Secondary | ICD-10-CM | POA: Diagnosis not present

## 2015-03-03 DIAGNOSIS — E1140 Type 2 diabetes mellitus with diabetic neuropathy, unspecified: Secondary | ICD-10-CM | POA: Diagnosis not present

## 2015-03-03 DIAGNOSIS — N183 Chronic kidney disease, stage 3 (moderate): Secondary | ICD-10-CM | POA: Diagnosis not present

## 2015-03-03 DIAGNOSIS — R809 Proteinuria, unspecified: Secondary | ICD-10-CM | POA: Diagnosis not present

## 2015-03-03 DIAGNOSIS — L97212 Non-pressure chronic ulcer of right calf with fat layer exposed: Secondary | ICD-10-CM | POA: Diagnosis not present

## 2015-03-03 DIAGNOSIS — E11622 Type 2 diabetes mellitus with other skin ulcer: Secondary | ICD-10-CM | POA: Diagnosis not present

## 2015-03-03 DIAGNOSIS — I10 Essential (primary) hypertension: Secondary | ICD-10-CM | POA: Diagnosis not present

## 2015-03-03 DIAGNOSIS — I87312 Chronic venous hypertension (idiopathic) with ulcer of left lower extremity: Secondary | ICD-10-CM | POA: Diagnosis not present

## 2015-03-03 DIAGNOSIS — L97222 Non-pressure chronic ulcer of left calf with fat layer exposed: Secondary | ICD-10-CM | POA: Diagnosis not present

## 2015-03-05 DIAGNOSIS — L97811 Non-pressure chronic ulcer of other part of right lower leg limited to breakdown of skin: Secondary | ICD-10-CM | POA: Diagnosis not present

## 2015-03-05 DIAGNOSIS — L97222 Non-pressure chronic ulcer of left calf with fat layer exposed: Secondary | ICD-10-CM | POA: Diagnosis not present

## 2015-03-05 DIAGNOSIS — I87312 Chronic venous hypertension (idiopathic) with ulcer of left lower extremity: Secondary | ICD-10-CM | POA: Diagnosis not present

## 2015-03-05 DIAGNOSIS — E11622 Type 2 diabetes mellitus with other skin ulcer: Secondary | ICD-10-CM | POA: Diagnosis not present

## 2015-03-05 DIAGNOSIS — L97821 Non-pressure chronic ulcer of other part of left lower leg limited to breakdown of skin: Secondary | ICD-10-CM | POA: Diagnosis not present

## 2015-03-07 DIAGNOSIS — E11622 Type 2 diabetes mellitus with other skin ulcer: Secondary | ICD-10-CM | POA: Diagnosis not present

## 2015-03-07 DIAGNOSIS — I10 Essential (primary) hypertension: Secondary | ICD-10-CM | POA: Diagnosis not present

## 2015-03-07 DIAGNOSIS — E1140 Type 2 diabetes mellitus with diabetic neuropathy, unspecified: Secondary | ICD-10-CM | POA: Diagnosis not present

## 2015-03-07 DIAGNOSIS — L97222 Non-pressure chronic ulcer of left calf with fat layer exposed: Secondary | ICD-10-CM | POA: Diagnosis not present

## 2015-03-07 DIAGNOSIS — L97212 Non-pressure chronic ulcer of right calf with fat layer exposed: Secondary | ICD-10-CM | POA: Diagnosis not present

## 2015-03-07 DIAGNOSIS — I87312 Chronic venous hypertension (idiopathic) with ulcer of left lower extremity: Secondary | ICD-10-CM | POA: Diagnosis not present

## 2015-03-10 DIAGNOSIS — M069 Rheumatoid arthritis, unspecified: Secondary | ICD-10-CM | POA: Diagnosis not present

## 2015-03-10 DIAGNOSIS — I10 Essential (primary) hypertension: Secondary | ICD-10-CM | POA: Diagnosis not present

## 2015-03-10 DIAGNOSIS — I87312 Chronic venous hypertension (idiopathic) with ulcer of left lower extremity: Secondary | ICD-10-CM | POA: Diagnosis not present

## 2015-03-10 DIAGNOSIS — L97212 Non-pressure chronic ulcer of right calf with fat layer exposed: Secondary | ICD-10-CM | POA: Diagnosis not present

## 2015-03-10 DIAGNOSIS — E1140 Type 2 diabetes mellitus with diabetic neuropathy, unspecified: Secondary | ICD-10-CM | POA: Diagnosis not present

## 2015-03-10 DIAGNOSIS — E11622 Type 2 diabetes mellitus with other skin ulcer: Secondary | ICD-10-CM | POA: Diagnosis not present

## 2015-03-10 DIAGNOSIS — R1031 Right lower quadrant pain: Secondary | ICD-10-CM | POA: Diagnosis not present

## 2015-03-10 DIAGNOSIS — L97222 Non-pressure chronic ulcer of left calf with fat layer exposed: Secondary | ICD-10-CM | POA: Diagnosis not present

## 2015-03-10 DIAGNOSIS — R102 Pelvic and perineal pain: Secondary | ICD-10-CM | POA: Diagnosis not present

## 2015-03-12 DIAGNOSIS — E11622 Type 2 diabetes mellitus with other skin ulcer: Secondary | ICD-10-CM | POA: Diagnosis not present

## 2015-03-12 DIAGNOSIS — L97821 Non-pressure chronic ulcer of other part of left lower leg limited to breakdown of skin: Secondary | ICD-10-CM | POA: Diagnosis not present

## 2015-03-12 DIAGNOSIS — L97222 Non-pressure chronic ulcer of left calf with fat layer exposed: Secondary | ICD-10-CM | POA: Diagnosis not present

## 2015-03-12 DIAGNOSIS — L97811 Non-pressure chronic ulcer of other part of right lower leg limited to breakdown of skin: Secondary | ICD-10-CM | POA: Diagnosis not present

## 2015-03-12 DIAGNOSIS — I87312 Chronic venous hypertension (idiopathic) with ulcer of left lower extremity: Secondary | ICD-10-CM | POA: Diagnosis not present

## 2015-03-14 DIAGNOSIS — I10 Essential (primary) hypertension: Secondary | ICD-10-CM | POA: Diagnosis not present

## 2015-03-14 DIAGNOSIS — E11622 Type 2 diabetes mellitus with other skin ulcer: Secondary | ICD-10-CM | POA: Diagnosis not present

## 2015-03-14 DIAGNOSIS — M069 Rheumatoid arthritis, unspecified: Secondary | ICD-10-CM | POA: Diagnosis not present

## 2015-03-14 DIAGNOSIS — Z86718 Personal history of other venous thrombosis and embolism: Secondary | ICD-10-CM | POA: Diagnosis not present

## 2015-03-14 DIAGNOSIS — Z794 Long term (current) use of insulin: Secondary | ICD-10-CM | POA: Diagnosis not present

## 2015-03-14 DIAGNOSIS — Z79891 Long term (current) use of opiate analgesic: Secondary | ICD-10-CM | POA: Diagnosis not present

## 2015-03-14 DIAGNOSIS — Z7952 Long term (current) use of systemic steroids: Secondary | ICD-10-CM | POA: Diagnosis not present

## 2015-03-14 DIAGNOSIS — L97212 Non-pressure chronic ulcer of right calf with fat layer exposed: Secondary | ICD-10-CM | POA: Diagnosis not present

## 2015-03-14 DIAGNOSIS — E1140 Type 2 diabetes mellitus with diabetic neuropathy, unspecified: Secondary | ICD-10-CM | POA: Diagnosis not present

## 2015-03-14 DIAGNOSIS — I87313 Chronic venous hypertension (idiopathic) with ulcer of bilateral lower extremity: Secondary | ICD-10-CM | POA: Diagnosis not present

## 2015-03-14 DIAGNOSIS — L97222 Non-pressure chronic ulcer of left calf with fat layer exposed: Secondary | ICD-10-CM | POA: Diagnosis not present

## 2015-03-17 DIAGNOSIS — I10 Essential (primary) hypertension: Secondary | ICD-10-CM | POA: Diagnosis not present

## 2015-03-17 DIAGNOSIS — I87313 Chronic venous hypertension (idiopathic) with ulcer of bilateral lower extremity: Secondary | ICD-10-CM | POA: Diagnosis not present

## 2015-03-17 DIAGNOSIS — E11622 Type 2 diabetes mellitus with other skin ulcer: Secondary | ICD-10-CM | POA: Diagnosis not present

## 2015-03-17 DIAGNOSIS — L97222 Non-pressure chronic ulcer of left calf with fat layer exposed: Secondary | ICD-10-CM | POA: Diagnosis not present

## 2015-03-17 DIAGNOSIS — L97212 Non-pressure chronic ulcer of right calf with fat layer exposed: Secondary | ICD-10-CM | POA: Diagnosis not present

## 2015-03-17 DIAGNOSIS — E1140 Type 2 diabetes mellitus with diabetic neuropathy, unspecified: Secondary | ICD-10-CM | POA: Diagnosis not present

## 2015-03-19 DIAGNOSIS — L97821 Non-pressure chronic ulcer of other part of left lower leg limited to breakdown of skin: Secondary | ICD-10-CM | POA: Diagnosis not present

## 2015-03-19 DIAGNOSIS — E11622 Type 2 diabetes mellitus with other skin ulcer: Secondary | ICD-10-CM | POA: Diagnosis not present

## 2015-03-19 DIAGNOSIS — L97222 Non-pressure chronic ulcer of left calf with fat layer exposed: Secondary | ICD-10-CM | POA: Diagnosis not present

## 2015-03-19 DIAGNOSIS — L97811 Non-pressure chronic ulcer of other part of right lower leg limited to breakdown of skin: Secondary | ICD-10-CM | POA: Diagnosis not present

## 2015-03-19 DIAGNOSIS — I87312 Chronic venous hypertension (idiopathic) with ulcer of left lower extremity: Secondary | ICD-10-CM | POA: Diagnosis not present

## 2015-03-21 DIAGNOSIS — E11622 Type 2 diabetes mellitus with other skin ulcer: Secondary | ICD-10-CM | POA: Diagnosis not present

## 2015-03-21 DIAGNOSIS — L97222 Non-pressure chronic ulcer of left calf with fat layer exposed: Secondary | ICD-10-CM | POA: Diagnosis not present

## 2015-03-21 DIAGNOSIS — E1140 Type 2 diabetes mellitus with diabetic neuropathy, unspecified: Secondary | ICD-10-CM | POA: Diagnosis not present

## 2015-03-21 DIAGNOSIS — L97212 Non-pressure chronic ulcer of right calf with fat layer exposed: Secondary | ICD-10-CM | POA: Diagnosis not present

## 2015-03-21 DIAGNOSIS — I10 Essential (primary) hypertension: Secondary | ICD-10-CM | POA: Diagnosis not present

## 2015-03-21 DIAGNOSIS — I87313 Chronic venous hypertension (idiopathic) with ulcer of bilateral lower extremity: Secondary | ICD-10-CM | POA: Diagnosis not present

## 2015-03-24 DIAGNOSIS — I10 Essential (primary) hypertension: Secondary | ICD-10-CM | POA: Diagnosis not present

## 2015-03-24 DIAGNOSIS — L97222 Non-pressure chronic ulcer of left calf with fat layer exposed: Secondary | ICD-10-CM | POA: Diagnosis not present

## 2015-03-24 DIAGNOSIS — I87313 Chronic venous hypertension (idiopathic) with ulcer of bilateral lower extremity: Secondary | ICD-10-CM | POA: Diagnosis not present

## 2015-03-24 DIAGNOSIS — L97212 Non-pressure chronic ulcer of right calf with fat layer exposed: Secondary | ICD-10-CM | POA: Diagnosis not present

## 2015-03-24 DIAGNOSIS — E1140 Type 2 diabetes mellitus with diabetic neuropathy, unspecified: Secondary | ICD-10-CM | POA: Diagnosis not present

## 2015-03-24 DIAGNOSIS — E11622 Type 2 diabetes mellitus with other skin ulcer: Secondary | ICD-10-CM | POA: Diagnosis not present

## 2015-03-26 DIAGNOSIS — L97821 Non-pressure chronic ulcer of other part of left lower leg limited to breakdown of skin: Secondary | ICD-10-CM | POA: Diagnosis not present

## 2015-03-26 DIAGNOSIS — I87312 Chronic venous hypertension (idiopathic) with ulcer of left lower extremity: Secondary | ICD-10-CM | POA: Diagnosis not present

## 2015-03-26 DIAGNOSIS — L97222 Non-pressure chronic ulcer of left calf with fat layer exposed: Secondary | ICD-10-CM | POA: Diagnosis not present

## 2015-03-26 DIAGNOSIS — L97811 Non-pressure chronic ulcer of other part of right lower leg limited to breakdown of skin: Secondary | ICD-10-CM | POA: Diagnosis not present

## 2015-03-26 DIAGNOSIS — E11622 Type 2 diabetes mellitus with other skin ulcer: Secondary | ICD-10-CM | POA: Diagnosis not present

## 2015-03-28 DIAGNOSIS — E11622 Type 2 diabetes mellitus with other skin ulcer: Secondary | ICD-10-CM | POA: Diagnosis not present

## 2015-03-28 DIAGNOSIS — I87313 Chronic venous hypertension (idiopathic) with ulcer of bilateral lower extremity: Secondary | ICD-10-CM | POA: Diagnosis not present

## 2015-03-28 DIAGNOSIS — L97222 Non-pressure chronic ulcer of left calf with fat layer exposed: Secondary | ICD-10-CM | POA: Diagnosis not present

## 2015-03-28 DIAGNOSIS — L97212 Non-pressure chronic ulcer of right calf with fat layer exposed: Secondary | ICD-10-CM | POA: Diagnosis not present

## 2015-03-28 DIAGNOSIS — E1140 Type 2 diabetes mellitus with diabetic neuropathy, unspecified: Secondary | ICD-10-CM | POA: Diagnosis not present

## 2015-03-28 DIAGNOSIS — I10 Essential (primary) hypertension: Secondary | ICD-10-CM | POA: Diagnosis not present

## 2015-03-31 DIAGNOSIS — L97212 Non-pressure chronic ulcer of right calf with fat layer exposed: Secondary | ICD-10-CM | POA: Diagnosis not present

## 2015-03-31 DIAGNOSIS — E1140 Type 2 diabetes mellitus with diabetic neuropathy, unspecified: Secondary | ICD-10-CM | POA: Diagnosis not present

## 2015-03-31 DIAGNOSIS — L97222 Non-pressure chronic ulcer of left calf with fat layer exposed: Secondary | ICD-10-CM | POA: Diagnosis not present

## 2015-03-31 DIAGNOSIS — I87313 Chronic venous hypertension (idiopathic) with ulcer of bilateral lower extremity: Secondary | ICD-10-CM | POA: Diagnosis not present

## 2015-03-31 DIAGNOSIS — E11622 Type 2 diabetes mellitus with other skin ulcer: Secondary | ICD-10-CM | POA: Diagnosis not present

## 2015-03-31 DIAGNOSIS — I10 Essential (primary) hypertension: Secondary | ICD-10-CM | POA: Diagnosis not present

## 2015-04-02 DIAGNOSIS — L97811 Non-pressure chronic ulcer of other part of right lower leg limited to breakdown of skin: Secondary | ICD-10-CM | POA: Diagnosis not present

## 2015-04-02 DIAGNOSIS — E11622 Type 2 diabetes mellitus with other skin ulcer: Secondary | ICD-10-CM | POA: Diagnosis not present

## 2015-04-02 DIAGNOSIS — L97821 Non-pressure chronic ulcer of other part of left lower leg limited to breakdown of skin: Secondary | ICD-10-CM | POA: Diagnosis not present

## 2015-04-02 DIAGNOSIS — L97222 Non-pressure chronic ulcer of left calf with fat layer exposed: Secondary | ICD-10-CM | POA: Diagnosis not present

## 2015-04-02 DIAGNOSIS — I87312 Chronic venous hypertension (idiopathic) with ulcer of left lower extremity: Secondary | ICD-10-CM | POA: Diagnosis not present

## 2015-04-04 DIAGNOSIS — E1140 Type 2 diabetes mellitus with diabetic neuropathy, unspecified: Secondary | ICD-10-CM | POA: Diagnosis not present

## 2015-04-04 DIAGNOSIS — I87313 Chronic venous hypertension (idiopathic) with ulcer of bilateral lower extremity: Secondary | ICD-10-CM | POA: Diagnosis not present

## 2015-04-04 DIAGNOSIS — L97212 Non-pressure chronic ulcer of right calf with fat layer exposed: Secondary | ICD-10-CM | POA: Diagnosis not present

## 2015-04-04 DIAGNOSIS — L97222 Non-pressure chronic ulcer of left calf with fat layer exposed: Secondary | ICD-10-CM | POA: Diagnosis not present

## 2015-04-04 DIAGNOSIS — E11622 Type 2 diabetes mellitus with other skin ulcer: Secondary | ICD-10-CM | POA: Diagnosis not present

## 2015-04-04 DIAGNOSIS — I10 Essential (primary) hypertension: Secondary | ICD-10-CM | POA: Diagnosis not present

## 2015-04-07 DIAGNOSIS — I10 Essential (primary) hypertension: Secondary | ICD-10-CM | POA: Diagnosis not present

## 2015-04-07 DIAGNOSIS — E1140 Type 2 diabetes mellitus with diabetic neuropathy, unspecified: Secondary | ICD-10-CM | POA: Diagnosis not present

## 2015-04-07 DIAGNOSIS — L97212 Non-pressure chronic ulcer of right calf with fat layer exposed: Secondary | ICD-10-CM | POA: Diagnosis not present

## 2015-04-07 DIAGNOSIS — I87313 Chronic venous hypertension (idiopathic) with ulcer of bilateral lower extremity: Secondary | ICD-10-CM | POA: Diagnosis not present

## 2015-04-07 DIAGNOSIS — E11622 Type 2 diabetes mellitus with other skin ulcer: Secondary | ICD-10-CM | POA: Diagnosis not present

## 2015-04-07 DIAGNOSIS — L97222 Non-pressure chronic ulcer of left calf with fat layer exposed: Secondary | ICD-10-CM | POA: Diagnosis not present

## 2015-04-08 DIAGNOSIS — M059 Rheumatoid arthritis with rheumatoid factor, unspecified: Secondary | ICD-10-CM | POA: Diagnosis not present

## 2015-04-08 DIAGNOSIS — Z6830 Body mass index (BMI) 30.0-30.9, adult: Secondary | ICD-10-CM | POA: Diagnosis not present

## 2015-04-10 DIAGNOSIS — L97811 Non-pressure chronic ulcer of other part of right lower leg limited to breakdown of skin: Secondary | ICD-10-CM | POA: Diagnosis not present

## 2015-04-10 DIAGNOSIS — E11622 Type 2 diabetes mellitus with other skin ulcer: Secondary | ICD-10-CM | POA: Diagnosis not present

## 2015-04-10 DIAGNOSIS — L97211 Non-pressure chronic ulcer of right calf limited to breakdown of skin: Secondary | ICD-10-CM | POA: Diagnosis not present

## 2015-04-14 DIAGNOSIS — I10 Essential (primary) hypertension: Secondary | ICD-10-CM | POA: Diagnosis not present

## 2015-04-14 DIAGNOSIS — E1140 Type 2 diabetes mellitus with diabetic neuropathy, unspecified: Secondary | ICD-10-CM | POA: Diagnosis not present

## 2015-04-14 DIAGNOSIS — L97222 Non-pressure chronic ulcer of left calf with fat layer exposed: Secondary | ICD-10-CM | POA: Diagnosis not present

## 2015-04-14 DIAGNOSIS — L97212 Non-pressure chronic ulcer of right calf with fat layer exposed: Secondary | ICD-10-CM | POA: Diagnosis not present

## 2015-04-14 DIAGNOSIS — E11622 Type 2 diabetes mellitus with other skin ulcer: Secondary | ICD-10-CM | POA: Diagnosis not present

## 2015-04-14 DIAGNOSIS — I87313 Chronic venous hypertension (idiopathic) with ulcer of bilateral lower extremity: Secondary | ICD-10-CM | POA: Diagnosis not present

## 2015-04-16 DIAGNOSIS — E11622 Type 2 diabetes mellitus with other skin ulcer: Secondary | ICD-10-CM | POA: Diagnosis not present

## 2015-04-16 DIAGNOSIS — L97811 Non-pressure chronic ulcer of other part of right lower leg limited to breakdown of skin: Secondary | ICD-10-CM | POA: Diagnosis not present

## 2015-04-16 DIAGNOSIS — L97211 Non-pressure chronic ulcer of right calf limited to breakdown of skin: Secondary | ICD-10-CM | POA: Diagnosis not present

## 2015-04-18 DIAGNOSIS — L97222 Non-pressure chronic ulcer of left calf with fat layer exposed: Secondary | ICD-10-CM | POA: Diagnosis not present

## 2015-04-18 DIAGNOSIS — E11622 Type 2 diabetes mellitus with other skin ulcer: Secondary | ICD-10-CM | POA: Diagnosis not present

## 2015-04-18 DIAGNOSIS — L97212 Non-pressure chronic ulcer of right calf with fat layer exposed: Secondary | ICD-10-CM | POA: Diagnosis not present

## 2015-04-18 DIAGNOSIS — I87313 Chronic venous hypertension (idiopathic) with ulcer of bilateral lower extremity: Secondary | ICD-10-CM | POA: Diagnosis not present

## 2015-04-18 DIAGNOSIS — I10 Essential (primary) hypertension: Secondary | ICD-10-CM | POA: Diagnosis not present

## 2015-04-18 DIAGNOSIS — E1140 Type 2 diabetes mellitus with diabetic neuropathy, unspecified: Secondary | ICD-10-CM | POA: Diagnosis not present

## 2015-04-21 DIAGNOSIS — L97222 Non-pressure chronic ulcer of left calf with fat layer exposed: Secondary | ICD-10-CM | POA: Diagnosis not present

## 2015-04-21 DIAGNOSIS — I10 Essential (primary) hypertension: Secondary | ICD-10-CM | POA: Diagnosis not present

## 2015-04-21 DIAGNOSIS — E11622 Type 2 diabetes mellitus with other skin ulcer: Secondary | ICD-10-CM | POA: Diagnosis not present

## 2015-04-21 DIAGNOSIS — L97212 Non-pressure chronic ulcer of right calf with fat layer exposed: Secondary | ICD-10-CM | POA: Diagnosis not present

## 2015-04-21 DIAGNOSIS — I87313 Chronic venous hypertension (idiopathic) with ulcer of bilateral lower extremity: Secondary | ICD-10-CM | POA: Diagnosis not present

## 2015-04-21 DIAGNOSIS — E1140 Type 2 diabetes mellitus with diabetic neuropathy, unspecified: Secondary | ICD-10-CM | POA: Diagnosis not present

## 2015-04-23 DIAGNOSIS — E11622 Type 2 diabetes mellitus with other skin ulcer: Secondary | ICD-10-CM | POA: Diagnosis not present

## 2015-04-23 DIAGNOSIS — L97811 Non-pressure chronic ulcer of other part of right lower leg limited to breakdown of skin: Secondary | ICD-10-CM | POA: Diagnosis not present

## 2015-04-25 DIAGNOSIS — L97222 Non-pressure chronic ulcer of left calf with fat layer exposed: Secondary | ICD-10-CM | POA: Diagnosis not present

## 2015-04-25 DIAGNOSIS — E1140 Type 2 diabetes mellitus with diabetic neuropathy, unspecified: Secondary | ICD-10-CM | POA: Diagnosis not present

## 2015-04-25 DIAGNOSIS — I10 Essential (primary) hypertension: Secondary | ICD-10-CM | POA: Diagnosis not present

## 2015-04-25 DIAGNOSIS — I87313 Chronic venous hypertension (idiopathic) with ulcer of bilateral lower extremity: Secondary | ICD-10-CM | POA: Diagnosis not present

## 2015-04-25 DIAGNOSIS — L97212 Non-pressure chronic ulcer of right calf with fat layer exposed: Secondary | ICD-10-CM | POA: Diagnosis not present

## 2015-04-25 DIAGNOSIS — E11622 Type 2 diabetes mellitus with other skin ulcer: Secondary | ICD-10-CM | POA: Diagnosis not present

## 2015-04-30 DIAGNOSIS — L97821 Non-pressure chronic ulcer of other part of left lower leg limited to breakdown of skin: Secondary | ICD-10-CM | POA: Diagnosis not present

## 2015-04-30 DIAGNOSIS — L97811 Non-pressure chronic ulcer of other part of right lower leg limited to breakdown of skin: Secondary | ICD-10-CM | POA: Diagnosis not present

## 2015-04-30 DIAGNOSIS — I87312 Chronic venous hypertension (idiopathic) with ulcer of left lower extremity: Secondary | ICD-10-CM | POA: Diagnosis not present

## 2015-04-30 DIAGNOSIS — E11622 Type 2 diabetes mellitus with other skin ulcer: Secondary | ICD-10-CM | POA: Diagnosis not present

## 2015-05-02 DIAGNOSIS — E1140 Type 2 diabetes mellitus with diabetic neuropathy, unspecified: Secondary | ICD-10-CM | POA: Diagnosis not present

## 2015-05-02 DIAGNOSIS — I10 Essential (primary) hypertension: Secondary | ICD-10-CM | POA: Diagnosis not present

## 2015-05-02 DIAGNOSIS — L97222 Non-pressure chronic ulcer of left calf with fat layer exposed: Secondary | ICD-10-CM | POA: Diagnosis not present

## 2015-05-02 DIAGNOSIS — E11622 Type 2 diabetes mellitus with other skin ulcer: Secondary | ICD-10-CM | POA: Diagnosis not present

## 2015-05-02 DIAGNOSIS — L97212 Non-pressure chronic ulcer of right calf with fat layer exposed: Secondary | ICD-10-CM | POA: Diagnosis not present

## 2015-05-02 DIAGNOSIS — I87313 Chronic venous hypertension (idiopathic) with ulcer of bilateral lower extremity: Secondary | ICD-10-CM | POA: Diagnosis not present

## 2015-05-05 DIAGNOSIS — L97212 Non-pressure chronic ulcer of right calf with fat layer exposed: Secondary | ICD-10-CM | POA: Diagnosis not present

## 2015-05-05 DIAGNOSIS — I10 Essential (primary) hypertension: Secondary | ICD-10-CM | POA: Diagnosis not present

## 2015-05-05 DIAGNOSIS — I87313 Chronic venous hypertension (idiopathic) with ulcer of bilateral lower extremity: Secondary | ICD-10-CM | POA: Diagnosis not present

## 2015-05-05 DIAGNOSIS — L97222 Non-pressure chronic ulcer of left calf with fat layer exposed: Secondary | ICD-10-CM | POA: Diagnosis not present

## 2015-05-05 DIAGNOSIS — E1140 Type 2 diabetes mellitus with diabetic neuropathy, unspecified: Secondary | ICD-10-CM | POA: Diagnosis not present

## 2015-05-05 DIAGNOSIS — E11622 Type 2 diabetes mellitus with other skin ulcer: Secondary | ICD-10-CM | POA: Diagnosis not present

## 2015-05-07 DIAGNOSIS — L97821 Non-pressure chronic ulcer of other part of left lower leg limited to breakdown of skin: Secondary | ICD-10-CM | POA: Diagnosis not present

## 2015-05-07 DIAGNOSIS — L97211 Non-pressure chronic ulcer of right calf limited to breakdown of skin: Secondary | ICD-10-CM | POA: Diagnosis not present

## 2015-05-07 DIAGNOSIS — I87312 Chronic venous hypertension (idiopathic) with ulcer of left lower extremity: Secondary | ICD-10-CM | POA: Diagnosis not present

## 2015-05-07 DIAGNOSIS — E11622 Type 2 diabetes mellitus with other skin ulcer: Secondary | ICD-10-CM | POA: Diagnosis not present

## 2015-05-07 DIAGNOSIS — L97811 Non-pressure chronic ulcer of other part of right lower leg limited to breakdown of skin: Secondary | ICD-10-CM | POA: Diagnosis not present

## 2015-05-07 DIAGNOSIS — L97221 Non-pressure chronic ulcer of left calf limited to breakdown of skin: Secondary | ICD-10-CM | POA: Diagnosis not present

## 2015-05-09 DIAGNOSIS — I10 Essential (primary) hypertension: Secondary | ICD-10-CM | POA: Diagnosis not present

## 2015-05-09 DIAGNOSIS — L97222 Non-pressure chronic ulcer of left calf with fat layer exposed: Secondary | ICD-10-CM | POA: Diagnosis not present

## 2015-05-09 DIAGNOSIS — E1140 Type 2 diabetes mellitus with diabetic neuropathy, unspecified: Secondary | ICD-10-CM | POA: Diagnosis not present

## 2015-05-09 DIAGNOSIS — E11622 Type 2 diabetes mellitus with other skin ulcer: Secondary | ICD-10-CM | POA: Diagnosis not present

## 2015-05-09 DIAGNOSIS — L97212 Non-pressure chronic ulcer of right calf with fat layer exposed: Secondary | ICD-10-CM | POA: Diagnosis not present

## 2015-05-09 DIAGNOSIS — I87313 Chronic venous hypertension (idiopathic) with ulcer of bilateral lower extremity: Secondary | ICD-10-CM | POA: Diagnosis not present

## 2015-05-13 DIAGNOSIS — Z7952 Long term (current) use of systemic steroids: Secondary | ICD-10-CM | POA: Diagnosis not present

## 2015-05-13 DIAGNOSIS — L97212 Non-pressure chronic ulcer of right calf with fat layer exposed: Secondary | ICD-10-CM | POA: Diagnosis not present

## 2015-05-13 DIAGNOSIS — L97222 Non-pressure chronic ulcer of left calf with fat layer exposed: Secondary | ICD-10-CM | POA: Diagnosis not present

## 2015-05-13 DIAGNOSIS — E1140 Type 2 diabetes mellitus with diabetic neuropathy, unspecified: Secondary | ICD-10-CM | POA: Diagnosis not present

## 2015-05-13 DIAGNOSIS — I10 Essential (primary) hypertension: Secondary | ICD-10-CM | POA: Diagnosis not present

## 2015-05-13 DIAGNOSIS — Z794 Long term (current) use of insulin: Secondary | ICD-10-CM | POA: Diagnosis not present

## 2015-05-13 DIAGNOSIS — Z79891 Long term (current) use of opiate analgesic: Secondary | ICD-10-CM | POA: Diagnosis not present

## 2015-05-13 DIAGNOSIS — M069 Rheumatoid arthritis, unspecified: Secondary | ICD-10-CM | POA: Diagnosis not present

## 2015-05-13 DIAGNOSIS — I87313 Chronic venous hypertension (idiopathic) with ulcer of bilateral lower extremity: Secondary | ICD-10-CM | POA: Diagnosis not present

## 2015-05-13 DIAGNOSIS — Z86718 Personal history of other venous thrombosis and embolism: Secondary | ICD-10-CM | POA: Diagnosis not present

## 2015-05-13 DIAGNOSIS — E11622 Type 2 diabetes mellitus with other skin ulcer: Secondary | ICD-10-CM | POA: Diagnosis not present

## 2015-05-14 DIAGNOSIS — L97221 Non-pressure chronic ulcer of left calf limited to breakdown of skin: Secondary | ICD-10-CM | POA: Diagnosis not present

## 2015-05-14 DIAGNOSIS — L97811 Non-pressure chronic ulcer of other part of right lower leg limited to breakdown of skin: Secondary | ICD-10-CM | POA: Diagnosis not present

## 2015-05-14 DIAGNOSIS — E11622 Type 2 diabetes mellitus with other skin ulcer: Secondary | ICD-10-CM | POA: Diagnosis not present

## 2015-05-14 DIAGNOSIS — I87312 Chronic venous hypertension (idiopathic) with ulcer of left lower extremity: Secondary | ICD-10-CM | POA: Diagnosis not present

## 2015-05-14 DIAGNOSIS — L97211 Non-pressure chronic ulcer of right calf limited to breakdown of skin: Secondary | ICD-10-CM | POA: Diagnosis not present

## 2015-05-14 DIAGNOSIS — I872 Venous insufficiency (chronic) (peripheral): Secondary | ICD-10-CM | POA: Diagnosis not present

## 2015-05-14 DIAGNOSIS — L97821 Non-pressure chronic ulcer of other part of left lower leg limited to breakdown of skin: Secondary | ICD-10-CM | POA: Diagnosis not present

## 2015-05-16 DIAGNOSIS — I87313 Chronic venous hypertension (idiopathic) with ulcer of bilateral lower extremity: Secondary | ICD-10-CM | POA: Diagnosis not present

## 2015-05-16 DIAGNOSIS — E11622 Type 2 diabetes mellitus with other skin ulcer: Secondary | ICD-10-CM | POA: Diagnosis not present

## 2015-05-16 DIAGNOSIS — L97222 Non-pressure chronic ulcer of left calf with fat layer exposed: Secondary | ICD-10-CM | POA: Diagnosis not present

## 2015-05-16 DIAGNOSIS — E1140 Type 2 diabetes mellitus with diabetic neuropathy, unspecified: Secondary | ICD-10-CM | POA: Diagnosis not present

## 2015-05-16 DIAGNOSIS — L97212 Non-pressure chronic ulcer of right calf with fat layer exposed: Secondary | ICD-10-CM | POA: Diagnosis not present

## 2015-05-16 DIAGNOSIS — I10 Essential (primary) hypertension: Secondary | ICD-10-CM | POA: Diagnosis not present

## 2015-05-19 DIAGNOSIS — E11622 Type 2 diabetes mellitus with other skin ulcer: Secondary | ICD-10-CM | POA: Diagnosis not present

## 2015-05-19 DIAGNOSIS — I87313 Chronic venous hypertension (idiopathic) with ulcer of bilateral lower extremity: Secondary | ICD-10-CM | POA: Diagnosis not present

## 2015-05-19 DIAGNOSIS — L97222 Non-pressure chronic ulcer of left calf with fat layer exposed: Secondary | ICD-10-CM | POA: Diagnosis not present

## 2015-05-19 DIAGNOSIS — I10 Essential (primary) hypertension: Secondary | ICD-10-CM | POA: Diagnosis not present

## 2015-05-19 DIAGNOSIS — L97212 Non-pressure chronic ulcer of right calf with fat layer exposed: Secondary | ICD-10-CM | POA: Diagnosis not present

## 2015-05-19 DIAGNOSIS — E1140 Type 2 diabetes mellitus with diabetic neuropathy, unspecified: Secondary | ICD-10-CM | POA: Diagnosis not present

## 2015-05-21 DIAGNOSIS — I87312 Chronic venous hypertension (idiopathic) with ulcer of left lower extremity: Secondary | ICD-10-CM | POA: Diagnosis not present

## 2015-05-21 DIAGNOSIS — L97222 Non-pressure chronic ulcer of left calf with fat layer exposed: Secondary | ICD-10-CM | POA: Diagnosis not present

## 2015-05-21 DIAGNOSIS — L97811 Non-pressure chronic ulcer of other part of right lower leg limited to breakdown of skin: Secondary | ICD-10-CM | POA: Diagnosis not present

## 2015-05-21 DIAGNOSIS — E11622 Type 2 diabetes mellitus with other skin ulcer: Secondary | ICD-10-CM | POA: Diagnosis not present

## 2015-05-21 DIAGNOSIS — L97211 Non-pressure chronic ulcer of right calf limited to breakdown of skin: Secondary | ICD-10-CM | POA: Diagnosis not present

## 2015-05-23 DIAGNOSIS — L97212 Non-pressure chronic ulcer of right calf with fat layer exposed: Secondary | ICD-10-CM | POA: Diagnosis not present

## 2015-05-23 DIAGNOSIS — E1140 Type 2 diabetes mellitus with diabetic neuropathy, unspecified: Secondary | ICD-10-CM | POA: Diagnosis not present

## 2015-05-23 DIAGNOSIS — E11622 Type 2 diabetes mellitus with other skin ulcer: Secondary | ICD-10-CM | POA: Diagnosis not present

## 2015-05-23 DIAGNOSIS — I87313 Chronic venous hypertension (idiopathic) with ulcer of bilateral lower extremity: Secondary | ICD-10-CM | POA: Diagnosis not present

## 2015-05-23 DIAGNOSIS — L97222 Non-pressure chronic ulcer of left calf with fat layer exposed: Secondary | ICD-10-CM | POA: Diagnosis not present

## 2015-05-23 DIAGNOSIS — I10 Essential (primary) hypertension: Secondary | ICD-10-CM | POA: Diagnosis not present

## 2015-05-26 DIAGNOSIS — L97222 Non-pressure chronic ulcer of left calf with fat layer exposed: Secondary | ICD-10-CM | POA: Diagnosis not present

## 2015-05-26 DIAGNOSIS — L97212 Non-pressure chronic ulcer of right calf with fat layer exposed: Secondary | ICD-10-CM | POA: Diagnosis not present

## 2015-05-26 DIAGNOSIS — I87313 Chronic venous hypertension (idiopathic) with ulcer of bilateral lower extremity: Secondary | ICD-10-CM | POA: Diagnosis not present

## 2015-05-26 DIAGNOSIS — I10 Essential (primary) hypertension: Secondary | ICD-10-CM | POA: Diagnosis not present

## 2015-05-26 DIAGNOSIS — E11622 Type 2 diabetes mellitus with other skin ulcer: Secondary | ICD-10-CM | POA: Diagnosis not present

## 2015-05-26 DIAGNOSIS — E1140 Type 2 diabetes mellitus with diabetic neuropathy, unspecified: Secondary | ICD-10-CM | POA: Diagnosis not present

## 2015-05-28 DIAGNOSIS — I87312 Chronic venous hypertension (idiopathic) with ulcer of left lower extremity: Secondary | ICD-10-CM | POA: Diagnosis not present

## 2015-05-28 DIAGNOSIS — E11622 Type 2 diabetes mellitus with other skin ulcer: Secondary | ICD-10-CM | POA: Diagnosis not present

## 2015-05-28 DIAGNOSIS — L97221 Non-pressure chronic ulcer of left calf limited to breakdown of skin: Secondary | ICD-10-CM | POA: Diagnosis not present

## 2015-05-28 DIAGNOSIS — L97821 Non-pressure chronic ulcer of other part of left lower leg limited to breakdown of skin: Secondary | ICD-10-CM | POA: Diagnosis not present

## 2015-05-30 DIAGNOSIS — I10 Essential (primary) hypertension: Secondary | ICD-10-CM | POA: Diagnosis not present

## 2015-05-30 DIAGNOSIS — L97212 Non-pressure chronic ulcer of right calf with fat layer exposed: Secondary | ICD-10-CM | POA: Diagnosis not present

## 2015-05-30 DIAGNOSIS — E1140 Type 2 diabetes mellitus with diabetic neuropathy, unspecified: Secondary | ICD-10-CM | POA: Diagnosis not present

## 2015-05-30 DIAGNOSIS — I87313 Chronic venous hypertension (idiopathic) with ulcer of bilateral lower extremity: Secondary | ICD-10-CM | POA: Diagnosis not present

## 2015-05-30 DIAGNOSIS — E11622 Type 2 diabetes mellitus with other skin ulcer: Secondary | ICD-10-CM | POA: Diagnosis not present

## 2015-05-30 DIAGNOSIS — L97222 Non-pressure chronic ulcer of left calf with fat layer exposed: Secondary | ICD-10-CM | POA: Diagnosis not present

## 2015-06-02 DIAGNOSIS — I10 Essential (primary) hypertension: Secondary | ICD-10-CM | POA: Diagnosis not present

## 2015-06-02 DIAGNOSIS — L97212 Non-pressure chronic ulcer of right calf with fat layer exposed: Secondary | ICD-10-CM | POA: Diagnosis not present

## 2015-06-02 DIAGNOSIS — L97222 Non-pressure chronic ulcer of left calf with fat layer exposed: Secondary | ICD-10-CM | POA: Diagnosis not present

## 2015-06-02 DIAGNOSIS — E1140 Type 2 diabetes mellitus with diabetic neuropathy, unspecified: Secondary | ICD-10-CM | POA: Diagnosis not present

## 2015-06-02 DIAGNOSIS — I87313 Chronic venous hypertension (idiopathic) with ulcer of bilateral lower extremity: Secondary | ICD-10-CM | POA: Diagnosis not present

## 2015-06-02 DIAGNOSIS — E11622 Type 2 diabetes mellitus with other skin ulcer: Secondary | ICD-10-CM | POA: Diagnosis not present

## 2015-06-04 DIAGNOSIS — E11622 Type 2 diabetes mellitus with other skin ulcer: Secondary | ICD-10-CM | POA: Diagnosis not present

## 2015-06-04 DIAGNOSIS — L97221 Non-pressure chronic ulcer of left calf limited to breakdown of skin: Secondary | ICD-10-CM | POA: Diagnosis not present

## 2015-06-04 DIAGNOSIS — I87312 Chronic venous hypertension (idiopathic) with ulcer of left lower extremity: Secondary | ICD-10-CM | POA: Diagnosis not present

## 2015-06-04 DIAGNOSIS — L97821 Non-pressure chronic ulcer of other part of left lower leg limited to breakdown of skin: Secondary | ICD-10-CM | POA: Diagnosis not present

## 2015-06-06 DIAGNOSIS — L97212 Non-pressure chronic ulcer of right calf with fat layer exposed: Secondary | ICD-10-CM | POA: Diagnosis not present

## 2015-06-06 DIAGNOSIS — I10 Essential (primary) hypertension: Secondary | ICD-10-CM | POA: Diagnosis not present

## 2015-06-06 DIAGNOSIS — E11622 Type 2 diabetes mellitus with other skin ulcer: Secondary | ICD-10-CM | POA: Diagnosis not present

## 2015-06-06 DIAGNOSIS — I87313 Chronic venous hypertension (idiopathic) with ulcer of bilateral lower extremity: Secondary | ICD-10-CM | POA: Diagnosis not present

## 2015-06-06 DIAGNOSIS — E1140 Type 2 diabetes mellitus with diabetic neuropathy, unspecified: Secondary | ICD-10-CM | POA: Diagnosis not present

## 2015-06-06 DIAGNOSIS — L97222 Non-pressure chronic ulcer of left calf with fat layer exposed: Secondary | ICD-10-CM | POA: Diagnosis not present

## 2015-06-09 DIAGNOSIS — L97222 Non-pressure chronic ulcer of left calf with fat layer exposed: Secondary | ICD-10-CM | POA: Diagnosis not present

## 2015-06-09 DIAGNOSIS — L97212 Non-pressure chronic ulcer of right calf with fat layer exposed: Secondary | ICD-10-CM | POA: Diagnosis not present

## 2015-06-09 DIAGNOSIS — I10 Essential (primary) hypertension: Secondary | ICD-10-CM | POA: Diagnosis not present

## 2015-06-09 DIAGNOSIS — E11622 Type 2 diabetes mellitus with other skin ulcer: Secondary | ICD-10-CM | POA: Diagnosis not present

## 2015-06-09 DIAGNOSIS — I87313 Chronic venous hypertension (idiopathic) with ulcer of bilateral lower extremity: Secondary | ICD-10-CM | POA: Diagnosis not present

## 2015-06-09 DIAGNOSIS — E1140 Type 2 diabetes mellitus with diabetic neuropathy, unspecified: Secondary | ICD-10-CM | POA: Diagnosis not present

## 2015-06-11 DIAGNOSIS — E11622 Type 2 diabetes mellitus with other skin ulcer: Secondary | ICD-10-CM | POA: Diagnosis not present

## 2015-06-11 DIAGNOSIS — L97821 Non-pressure chronic ulcer of other part of left lower leg limited to breakdown of skin: Secondary | ICD-10-CM | POA: Diagnosis not present

## 2015-06-11 DIAGNOSIS — L97221 Non-pressure chronic ulcer of left calf limited to breakdown of skin: Secondary | ICD-10-CM | POA: Diagnosis not present

## 2015-06-11 DIAGNOSIS — I87312 Chronic venous hypertension (idiopathic) with ulcer of left lower extremity: Secondary | ICD-10-CM | POA: Diagnosis not present

## 2015-06-13 DIAGNOSIS — E11622 Type 2 diabetes mellitus with other skin ulcer: Secondary | ICD-10-CM | POA: Diagnosis not present

## 2015-06-13 DIAGNOSIS — L97222 Non-pressure chronic ulcer of left calf with fat layer exposed: Secondary | ICD-10-CM | POA: Diagnosis not present

## 2015-06-13 DIAGNOSIS — E1140 Type 2 diabetes mellitus with diabetic neuropathy, unspecified: Secondary | ICD-10-CM | POA: Diagnosis not present

## 2015-06-13 DIAGNOSIS — L97212 Non-pressure chronic ulcer of right calf with fat layer exposed: Secondary | ICD-10-CM | POA: Diagnosis not present

## 2015-06-13 DIAGNOSIS — I87313 Chronic venous hypertension (idiopathic) with ulcer of bilateral lower extremity: Secondary | ICD-10-CM | POA: Diagnosis not present

## 2015-06-13 DIAGNOSIS — I10 Essential (primary) hypertension: Secondary | ICD-10-CM | POA: Diagnosis not present

## 2015-06-15 DIAGNOSIS — N760 Acute vaginitis: Secondary | ICD-10-CM | POA: Diagnosis not present

## 2015-06-16 DIAGNOSIS — I10 Essential (primary) hypertension: Secondary | ICD-10-CM | POA: Diagnosis not present

## 2015-06-16 DIAGNOSIS — E1140 Type 2 diabetes mellitus with diabetic neuropathy, unspecified: Secondary | ICD-10-CM | POA: Diagnosis not present

## 2015-06-16 DIAGNOSIS — L97222 Non-pressure chronic ulcer of left calf with fat layer exposed: Secondary | ICD-10-CM | POA: Diagnosis not present

## 2015-06-16 DIAGNOSIS — E11622 Type 2 diabetes mellitus with other skin ulcer: Secondary | ICD-10-CM | POA: Diagnosis not present

## 2015-06-16 DIAGNOSIS — I87313 Chronic venous hypertension (idiopathic) with ulcer of bilateral lower extremity: Secondary | ICD-10-CM | POA: Diagnosis not present

## 2015-06-16 DIAGNOSIS — L97212 Non-pressure chronic ulcer of right calf with fat layer exposed: Secondary | ICD-10-CM | POA: Diagnosis not present

## 2015-06-18 DIAGNOSIS — L97821 Non-pressure chronic ulcer of other part of left lower leg limited to breakdown of skin: Secondary | ICD-10-CM | POA: Diagnosis not present

## 2015-06-18 DIAGNOSIS — E11622 Type 2 diabetes mellitus with other skin ulcer: Secondary | ICD-10-CM | POA: Diagnosis not present

## 2015-06-18 DIAGNOSIS — L97221 Non-pressure chronic ulcer of left calf limited to breakdown of skin: Secondary | ICD-10-CM | POA: Diagnosis not present

## 2015-06-18 DIAGNOSIS — I87312 Chronic venous hypertension (idiopathic) with ulcer of left lower extremity: Secondary | ICD-10-CM | POA: Diagnosis not present

## 2015-06-20 DIAGNOSIS — E11622 Type 2 diabetes mellitus with other skin ulcer: Secondary | ICD-10-CM | POA: Diagnosis not present

## 2015-06-20 DIAGNOSIS — I87313 Chronic venous hypertension (idiopathic) with ulcer of bilateral lower extremity: Secondary | ICD-10-CM | POA: Diagnosis not present

## 2015-06-20 DIAGNOSIS — I10 Essential (primary) hypertension: Secondary | ICD-10-CM | POA: Diagnosis not present

## 2015-06-20 DIAGNOSIS — L97222 Non-pressure chronic ulcer of left calf with fat layer exposed: Secondary | ICD-10-CM | POA: Diagnosis not present

## 2015-06-20 DIAGNOSIS — E1140 Type 2 diabetes mellitus with diabetic neuropathy, unspecified: Secondary | ICD-10-CM | POA: Diagnosis not present

## 2015-06-20 DIAGNOSIS — L97212 Non-pressure chronic ulcer of right calf with fat layer exposed: Secondary | ICD-10-CM | POA: Diagnosis not present

## 2015-06-26 DIAGNOSIS — E11622 Type 2 diabetes mellitus with other skin ulcer: Secondary | ICD-10-CM | POA: Diagnosis not present

## 2015-06-26 DIAGNOSIS — I87312 Chronic venous hypertension (idiopathic) with ulcer of left lower extremity: Secondary | ICD-10-CM | POA: Diagnosis not present

## 2015-06-26 DIAGNOSIS — L97821 Non-pressure chronic ulcer of other part of left lower leg limited to breakdown of skin: Secondary | ICD-10-CM | POA: Diagnosis not present

## 2015-06-26 DIAGNOSIS — L97221 Non-pressure chronic ulcer of left calf limited to breakdown of skin: Secondary | ICD-10-CM | POA: Diagnosis not present

## 2015-06-30 DIAGNOSIS — E11622 Type 2 diabetes mellitus with other skin ulcer: Secondary | ICD-10-CM | POA: Diagnosis not present

## 2015-06-30 DIAGNOSIS — L97222 Non-pressure chronic ulcer of left calf with fat layer exposed: Secondary | ICD-10-CM | POA: Diagnosis not present

## 2015-06-30 DIAGNOSIS — E1140 Type 2 diabetes mellitus with diabetic neuropathy, unspecified: Secondary | ICD-10-CM | POA: Diagnosis not present

## 2015-06-30 DIAGNOSIS — I87313 Chronic venous hypertension (idiopathic) with ulcer of bilateral lower extremity: Secondary | ICD-10-CM | POA: Diagnosis not present

## 2015-06-30 DIAGNOSIS — L97212 Non-pressure chronic ulcer of right calf with fat layer exposed: Secondary | ICD-10-CM | POA: Diagnosis not present

## 2015-06-30 DIAGNOSIS — I10 Essential (primary) hypertension: Secondary | ICD-10-CM | POA: Diagnosis not present

## 2015-07-02 DIAGNOSIS — E11622 Type 2 diabetes mellitus with other skin ulcer: Secondary | ICD-10-CM | POA: Diagnosis not present

## 2015-07-02 DIAGNOSIS — L97821 Non-pressure chronic ulcer of other part of left lower leg limited to breakdown of skin: Secondary | ICD-10-CM | POA: Diagnosis not present

## 2015-07-02 DIAGNOSIS — I87312 Chronic venous hypertension (idiopathic) with ulcer of left lower extremity: Secondary | ICD-10-CM | POA: Diagnosis not present

## 2015-07-02 DIAGNOSIS — L97221 Non-pressure chronic ulcer of left calf limited to breakdown of skin: Secondary | ICD-10-CM | POA: Diagnosis not present

## 2015-07-04 DIAGNOSIS — I87313 Chronic venous hypertension (idiopathic) with ulcer of bilateral lower extremity: Secondary | ICD-10-CM | POA: Diagnosis not present

## 2015-07-04 DIAGNOSIS — I10 Essential (primary) hypertension: Secondary | ICD-10-CM | POA: Diagnosis not present

## 2015-07-04 DIAGNOSIS — L97212 Non-pressure chronic ulcer of right calf with fat layer exposed: Secondary | ICD-10-CM | POA: Diagnosis not present

## 2015-07-04 DIAGNOSIS — E11622 Type 2 diabetes mellitus with other skin ulcer: Secondary | ICD-10-CM | POA: Diagnosis not present

## 2015-07-04 DIAGNOSIS — L97222 Non-pressure chronic ulcer of left calf with fat layer exposed: Secondary | ICD-10-CM | POA: Diagnosis not present

## 2015-07-04 DIAGNOSIS — E1140 Type 2 diabetes mellitus with diabetic neuropathy, unspecified: Secondary | ICD-10-CM | POA: Diagnosis not present

## 2015-07-07 DIAGNOSIS — E11622 Type 2 diabetes mellitus with other skin ulcer: Secondary | ICD-10-CM | POA: Diagnosis not present

## 2015-07-07 DIAGNOSIS — E1140 Type 2 diabetes mellitus with diabetic neuropathy, unspecified: Secondary | ICD-10-CM | POA: Diagnosis not present

## 2015-07-07 DIAGNOSIS — L97212 Non-pressure chronic ulcer of right calf with fat layer exposed: Secondary | ICD-10-CM | POA: Diagnosis not present

## 2015-07-07 DIAGNOSIS — I87313 Chronic venous hypertension (idiopathic) with ulcer of bilateral lower extremity: Secondary | ICD-10-CM | POA: Diagnosis not present

## 2015-07-07 DIAGNOSIS — L97222 Non-pressure chronic ulcer of left calf with fat layer exposed: Secondary | ICD-10-CM | POA: Diagnosis not present

## 2015-07-07 DIAGNOSIS — I10 Essential (primary) hypertension: Secondary | ICD-10-CM | POA: Diagnosis not present

## 2015-07-09 DIAGNOSIS — L97221 Non-pressure chronic ulcer of left calf limited to breakdown of skin: Secondary | ICD-10-CM | POA: Diagnosis not present

## 2015-07-09 DIAGNOSIS — E11622 Type 2 diabetes mellitus with other skin ulcer: Secondary | ICD-10-CM | POA: Diagnosis not present

## 2015-07-09 DIAGNOSIS — I87312 Chronic venous hypertension (idiopathic) with ulcer of left lower extremity: Secondary | ICD-10-CM | POA: Diagnosis not present

## 2015-07-09 DIAGNOSIS — L97821 Non-pressure chronic ulcer of other part of left lower leg limited to breakdown of skin: Secondary | ICD-10-CM | POA: Diagnosis not present

## 2015-07-11 DIAGNOSIS — I87313 Chronic venous hypertension (idiopathic) with ulcer of bilateral lower extremity: Secondary | ICD-10-CM | POA: Diagnosis not present

## 2015-07-11 DIAGNOSIS — I10 Essential (primary) hypertension: Secondary | ICD-10-CM | POA: Diagnosis not present

## 2015-07-11 DIAGNOSIS — E1140 Type 2 diabetes mellitus with diabetic neuropathy, unspecified: Secondary | ICD-10-CM | POA: Diagnosis not present

## 2015-07-11 DIAGNOSIS — L97222 Non-pressure chronic ulcer of left calf with fat layer exposed: Secondary | ICD-10-CM | POA: Diagnosis not present

## 2015-07-11 DIAGNOSIS — E11622 Type 2 diabetes mellitus with other skin ulcer: Secondary | ICD-10-CM | POA: Diagnosis not present

## 2015-07-11 DIAGNOSIS — L97212 Non-pressure chronic ulcer of right calf with fat layer exposed: Secondary | ICD-10-CM | POA: Diagnosis not present

## 2015-07-12 DIAGNOSIS — E1140 Type 2 diabetes mellitus with diabetic neuropathy, unspecified: Secondary | ICD-10-CM | POA: Diagnosis not present

## 2015-07-12 DIAGNOSIS — I10 Essential (primary) hypertension: Secondary | ICD-10-CM | POA: Diagnosis not present

## 2015-07-12 DIAGNOSIS — L97212 Non-pressure chronic ulcer of right calf with fat layer exposed: Secondary | ICD-10-CM | POA: Diagnosis not present

## 2015-07-12 DIAGNOSIS — E11622 Type 2 diabetes mellitus with other skin ulcer: Secondary | ICD-10-CM | POA: Diagnosis not present

## 2015-07-12 DIAGNOSIS — Z86718 Personal history of other venous thrombosis and embolism: Secondary | ICD-10-CM | POA: Diagnosis not present

## 2015-07-12 DIAGNOSIS — M069 Rheumatoid arthritis, unspecified: Secondary | ICD-10-CM | POA: Diagnosis not present

## 2015-07-12 DIAGNOSIS — Z794 Long term (current) use of insulin: Secondary | ICD-10-CM | POA: Diagnosis not present

## 2015-07-12 DIAGNOSIS — Z79891 Long term (current) use of opiate analgesic: Secondary | ICD-10-CM | POA: Diagnosis not present

## 2015-07-12 DIAGNOSIS — L97222 Non-pressure chronic ulcer of left calf with fat layer exposed: Secondary | ICD-10-CM | POA: Diagnosis not present

## 2015-07-12 DIAGNOSIS — I87313 Chronic venous hypertension (idiopathic) with ulcer of bilateral lower extremity: Secondary | ICD-10-CM | POA: Diagnosis not present

## 2015-07-12 DIAGNOSIS — Z7952 Long term (current) use of systemic steroids: Secondary | ICD-10-CM | POA: Diagnosis not present

## 2015-07-14 DIAGNOSIS — G5790 Unspecified mononeuropathy of unspecified lower limb: Secondary | ICD-10-CM | POA: Diagnosis not present

## 2015-07-14 DIAGNOSIS — E1140 Type 2 diabetes mellitus with diabetic neuropathy, unspecified: Secondary | ICD-10-CM | POA: Diagnosis not present

## 2015-07-14 DIAGNOSIS — Z6830 Body mass index (BMI) 30.0-30.9, adult: Secondary | ICD-10-CM | POA: Diagnosis not present

## 2015-07-14 DIAGNOSIS — I10 Essential (primary) hypertension: Secondary | ICD-10-CM | POA: Diagnosis not present

## 2015-07-14 DIAGNOSIS — M059 Rheumatoid arthritis with rheumatoid factor, unspecified: Secondary | ICD-10-CM | POA: Diagnosis not present

## 2015-07-14 DIAGNOSIS — L97222 Non-pressure chronic ulcer of left calf with fat layer exposed: Secondary | ICD-10-CM | POA: Diagnosis not present

## 2015-07-14 DIAGNOSIS — L97212 Non-pressure chronic ulcer of right calf with fat layer exposed: Secondary | ICD-10-CM | POA: Diagnosis not present

## 2015-07-14 DIAGNOSIS — E11622 Type 2 diabetes mellitus with other skin ulcer: Secondary | ICD-10-CM | POA: Diagnosis not present

## 2015-07-14 DIAGNOSIS — I87313 Chronic venous hypertension (idiopathic) with ulcer of bilateral lower extremity: Secondary | ICD-10-CM | POA: Diagnosis not present

## 2015-07-17 DIAGNOSIS — L97821 Non-pressure chronic ulcer of other part of left lower leg limited to breakdown of skin: Secondary | ICD-10-CM | POA: Diagnosis not present

## 2015-07-17 DIAGNOSIS — I87312 Chronic venous hypertension (idiopathic) with ulcer of left lower extremity: Secondary | ICD-10-CM | POA: Diagnosis not present

## 2015-07-17 DIAGNOSIS — L97221 Non-pressure chronic ulcer of left calf limited to breakdown of skin: Secondary | ICD-10-CM | POA: Diagnosis not present

## 2015-07-17 DIAGNOSIS — E11622 Type 2 diabetes mellitus with other skin ulcer: Secondary | ICD-10-CM | POA: Diagnosis not present

## 2015-07-21 DIAGNOSIS — Z79899 Other long term (current) drug therapy: Secondary | ICD-10-CM | POA: Diagnosis not present

## 2015-07-21 DIAGNOSIS — E11621 Type 2 diabetes mellitus with foot ulcer: Secondary | ICD-10-CM | POA: Diagnosis not present

## 2015-07-21 DIAGNOSIS — E1165 Type 2 diabetes mellitus with hyperglycemia: Secondary | ICD-10-CM | POA: Diagnosis not present

## 2015-07-21 DIAGNOSIS — L97519 Non-pressure chronic ulcer of other part of right foot with unspecified severity: Secondary | ICD-10-CM | POA: Diagnosis not present

## 2015-07-21 DIAGNOSIS — E1140 Type 2 diabetes mellitus with diabetic neuropathy, unspecified: Secondary | ICD-10-CM | POA: Diagnosis not present

## 2015-07-21 DIAGNOSIS — E039 Hypothyroidism, unspecified: Secondary | ICD-10-CM | POA: Diagnosis not present

## 2015-07-22 DIAGNOSIS — E11622 Type 2 diabetes mellitus with other skin ulcer: Secondary | ICD-10-CM | POA: Diagnosis not present

## 2015-07-22 DIAGNOSIS — L97222 Non-pressure chronic ulcer of left calf with fat layer exposed: Secondary | ICD-10-CM | POA: Diagnosis not present

## 2015-07-22 DIAGNOSIS — I87312 Chronic venous hypertension (idiopathic) with ulcer of left lower extremity: Secondary | ICD-10-CM | POA: Diagnosis not present

## 2015-07-22 DIAGNOSIS — L97829 Non-pressure chronic ulcer of other part of left lower leg with unspecified severity: Secondary | ICD-10-CM | POA: Diagnosis not present

## 2015-07-24 DIAGNOSIS — Z79899 Other long term (current) drug therapy: Secondary | ICD-10-CM | POA: Diagnosis not present

## 2015-07-24 DIAGNOSIS — M0579 Rheumatoid arthritis with rheumatoid factor of multiple sites without organ or systems involvement: Secondary | ICD-10-CM | POA: Diagnosis not present

## 2015-07-28 DIAGNOSIS — E11622 Type 2 diabetes mellitus with other skin ulcer: Secondary | ICD-10-CM | POA: Diagnosis not present

## 2015-07-28 DIAGNOSIS — I87313 Chronic venous hypertension (idiopathic) with ulcer of bilateral lower extremity: Secondary | ICD-10-CM | POA: Diagnosis not present

## 2015-07-28 DIAGNOSIS — I10 Essential (primary) hypertension: Secondary | ICD-10-CM | POA: Diagnosis not present

## 2015-07-28 DIAGNOSIS — L97212 Non-pressure chronic ulcer of right calf with fat layer exposed: Secondary | ICD-10-CM | POA: Diagnosis not present

## 2015-07-28 DIAGNOSIS — E1140 Type 2 diabetes mellitus with diabetic neuropathy, unspecified: Secondary | ICD-10-CM | POA: Diagnosis not present

## 2015-07-28 DIAGNOSIS — L97222 Non-pressure chronic ulcer of left calf with fat layer exposed: Secondary | ICD-10-CM | POA: Diagnosis not present

## 2015-07-30 DIAGNOSIS — L97821 Non-pressure chronic ulcer of other part of left lower leg limited to breakdown of skin: Secondary | ICD-10-CM | POA: Diagnosis not present

## 2015-07-30 DIAGNOSIS — I87312 Chronic venous hypertension (idiopathic) with ulcer of left lower extremity: Secondary | ICD-10-CM | POA: Diagnosis not present

## 2015-07-30 DIAGNOSIS — E11622 Type 2 diabetes mellitus with other skin ulcer: Secondary | ICD-10-CM | POA: Diagnosis not present

## 2015-08-01 DIAGNOSIS — L97212 Non-pressure chronic ulcer of right calf with fat layer exposed: Secondary | ICD-10-CM | POA: Diagnosis not present

## 2015-08-01 DIAGNOSIS — L97222 Non-pressure chronic ulcer of left calf with fat layer exposed: Secondary | ICD-10-CM | POA: Diagnosis not present

## 2015-08-01 DIAGNOSIS — E1140 Type 2 diabetes mellitus with diabetic neuropathy, unspecified: Secondary | ICD-10-CM | POA: Diagnosis not present

## 2015-08-01 DIAGNOSIS — I87313 Chronic venous hypertension (idiopathic) with ulcer of bilateral lower extremity: Secondary | ICD-10-CM | POA: Diagnosis not present

## 2015-08-01 DIAGNOSIS — I10 Essential (primary) hypertension: Secondary | ICD-10-CM | POA: Diagnosis not present

## 2015-08-01 DIAGNOSIS — E11622 Type 2 diabetes mellitus with other skin ulcer: Secondary | ICD-10-CM | POA: Diagnosis not present

## 2015-08-04 DIAGNOSIS — L97222 Non-pressure chronic ulcer of left calf with fat layer exposed: Secondary | ICD-10-CM | POA: Diagnosis not present

## 2015-08-04 DIAGNOSIS — E11622 Type 2 diabetes mellitus with other skin ulcer: Secondary | ICD-10-CM | POA: Diagnosis not present

## 2015-08-04 DIAGNOSIS — I87313 Chronic venous hypertension (idiopathic) with ulcer of bilateral lower extremity: Secondary | ICD-10-CM | POA: Diagnosis not present

## 2015-08-04 DIAGNOSIS — L97212 Non-pressure chronic ulcer of right calf with fat layer exposed: Secondary | ICD-10-CM | POA: Diagnosis not present

## 2015-08-04 DIAGNOSIS — I10 Essential (primary) hypertension: Secondary | ICD-10-CM | POA: Diagnosis not present

## 2015-08-04 DIAGNOSIS — E1140 Type 2 diabetes mellitus with diabetic neuropathy, unspecified: Secondary | ICD-10-CM | POA: Diagnosis not present

## 2015-08-06 DIAGNOSIS — L97222 Non-pressure chronic ulcer of left calf with fat layer exposed: Secondary | ICD-10-CM | POA: Diagnosis not present

## 2015-08-06 DIAGNOSIS — L97821 Non-pressure chronic ulcer of other part of left lower leg limited to breakdown of skin: Secondary | ICD-10-CM | POA: Diagnosis not present

## 2015-08-06 DIAGNOSIS — I87312 Chronic venous hypertension (idiopathic) with ulcer of left lower extremity: Secondary | ICD-10-CM | POA: Diagnosis not present

## 2015-08-06 DIAGNOSIS — E11622 Type 2 diabetes mellitus with other skin ulcer: Secondary | ICD-10-CM | POA: Diagnosis not present

## 2015-08-08 DIAGNOSIS — E1140 Type 2 diabetes mellitus with diabetic neuropathy, unspecified: Secondary | ICD-10-CM | POA: Diagnosis not present

## 2015-08-08 DIAGNOSIS — I10 Essential (primary) hypertension: Secondary | ICD-10-CM | POA: Diagnosis not present

## 2015-08-08 DIAGNOSIS — L97212 Non-pressure chronic ulcer of right calf with fat layer exposed: Secondary | ICD-10-CM | POA: Diagnosis not present

## 2015-08-08 DIAGNOSIS — I87313 Chronic venous hypertension (idiopathic) with ulcer of bilateral lower extremity: Secondary | ICD-10-CM | POA: Diagnosis not present

## 2015-08-08 DIAGNOSIS — L97222 Non-pressure chronic ulcer of left calf with fat layer exposed: Secondary | ICD-10-CM | POA: Diagnosis not present

## 2015-08-08 DIAGNOSIS — E11622 Type 2 diabetes mellitus with other skin ulcer: Secondary | ICD-10-CM | POA: Diagnosis not present

## 2015-08-11 DIAGNOSIS — L97212 Non-pressure chronic ulcer of right calf with fat layer exposed: Secondary | ICD-10-CM | POA: Diagnosis not present

## 2015-08-11 DIAGNOSIS — E11622 Type 2 diabetes mellitus with other skin ulcer: Secondary | ICD-10-CM | POA: Diagnosis not present

## 2015-08-11 DIAGNOSIS — I10 Essential (primary) hypertension: Secondary | ICD-10-CM | POA: Diagnosis not present

## 2015-08-11 DIAGNOSIS — E1140 Type 2 diabetes mellitus with diabetic neuropathy, unspecified: Secondary | ICD-10-CM | POA: Diagnosis not present

## 2015-08-11 DIAGNOSIS — I87313 Chronic venous hypertension (idiopathic) with ulcer of bilateral lower extremity: Secondary | ICD-10-CM | POA: Diagnosis not present

## 2015-08-11 DIAGNOSIS — L97222 Non-pressure chronic ulcer of left calf with fat layer exposed: Secondary | ICD-10-CM | POA: Diagnosis not present

## 2015-08-13 DIAGNOSIS — E119 Type 2 diabetes mellitus without complications: Secondary | ICD-10-CM | POA: Diagnosis not present

## 2015-08-13 DIAGNOSIS — Z09 Encounter for follow-up examination after completed treatment for conditions other than malignant neoplasm: Secondary | ICD-10-CM | POA: Diagnosis not present

## 2015-08-13 DIAGNOSIS — Z872 Personal history of diseases of the skin and subcutaneous tissue: Secondary | ICD-10-CM | POA: Diagnosis not present

## 2015-08-13 DIAGNOSIS — I87302 Chronic venous hypertension (idiopathic) without complications of left lower extremity: Secondary | ICD-10-CM | POA: Diagnosis not present

## 2015-08-15 DIAGNOSIS — I87313 Chronic venous hypertension (idiopathic) with ulcer of bilateral lower extremity: Secondary | ICD-10-CM | POA: Diagnosis not present

## 2015-08-15 DIAGNOSIS — E1140 Type 2 diabetes mellitus with diabetic neuropathy, unspecified: Secondary | ICD-10-CM | POA: Diagnosis not present

## 2015-08-15 DIAGNOSIS — L97212 Non-pressure chronic ulcer of right calf with fat layer exposed: Secondary | ICD-10-CM | POA: Diagnosis not present

## 2015-08-15 DIAGNOSIS — E11622 Type 2 diabetes mellitus with other skin ulcer: Secondary | ICD-10-CM | POA: Diagnosis not present

## 2015-08-15 DIAGNOSIS — I10 Essential (primary) hypertension: Secondary | ICD-10-CM | POA: Diagnosis not present

## 2015-08-15 DIAGNOSIS — L97222 Non-pressure chronic ulcer of left calf with fat layer exposed: Secondary | ICD-10-CM | POA: Diagnosis not present

## 2015-08-18 DIAGNOSIS — L97222 Non-pressure chronic ulcer of left calf with fat layer exposed: Secondary | ICD-10-CM | POA: Diagnosis not present

## 2015-08-18 DIAGNOSIS — I87313 Chronic venous hypertension (idiopathic) with ulcer of bilateral lower extremity: Secondary | ICD-10-CM | POA: Diagnosis not present

## 2015-08-18 DIAGNOSIS — E11622 Type 2 diabetes mellitus with other skin ulcer: Secondary | ICD-10-CM | POA: Diagnosis not present

## 2015-08-18 DIAGNOSIS — I10 Essential (primary) hypertension: Secondary | ICD-10-CM | POA: Diagnosis not present

## 2015-08-18 DIAGNOSIS — E1140 Type 2 diabetes mellitus with diabetic neuropathy, unspecified: Secondary | ICD-10-CM | POA: Diagnosis not present

## 2015-08-18 DIAGNOSIS — L97212 Non-pressure chronic ulcer of right calf with fat layer exposed: Secondary | ICD-10-CM | POA: Diagnosis not present

## 2015-08-21 DIAGNOSIS — E11622 Type 2 diabetes mellitus with other skin ulcer: Secondary | ICD-10-CM | POA: Diagnosis not present

## 2015-08-21 DIAGNOSIS — I87313 Chronic venous hypertension (idiopathic) with ulcer of bilateral lower extremity: Secondary | ICD-10-CM | POA: Diagnosis not present

## 2015-08-21 DIAGNOSIS — E1140 Type 2 diabetes mellitus with diabetic neuropathy, unspecified: Secondary | ICD-10-CM | POA: Diagnosis not present

## 2015-08-21 DIAGNOSIS — L97212 Non-pressure chronic ulcer of right calf with fat layer exposed: Secondary | ICD-10-CM | POA: Diagnosis not present

## 2015-08-21 DIAGNOSIS — L97222 Non-pressure chronic ulcer of left calf with fat layer exposed: Secondary | ICD-10-CM | POA: Diagnosis not present

## 2015-08-21 DIAGNOSIS — I10 Essential (primary) hypertension: Secondary | ICD-10-CM | POA: Diagnosis not present

## 2015-08-25 DIAGNOSIS — L97222 Non-pressure chronic ulcer of left calf with fat layer exposed: Secondary | ICD-10-CM | POA: Diagnosis not present

## 2015-08-25 DIAGNOSIS — I87313 Chronic venous hypertension (idiopathic) with ulcer of bilateral lower extremity: Secondary | ICD-10-CM | POA: Diagnosis not present

## 2015-08-25 DIAGNOSIS — E1140 Type 2 diabetes mellitus with diabetic neuropathy, unspecified: Secondary | ICD-10-CM | POA: Diagnosis not present

## 2015-08-25 DIAGNOSIS — E11622 Type 2 diabetes mellitus with other skin ulcer: Secondary | ICD-10-CM | POA: Diagnosis not present

## 2015-08-25 DIAGNOSIS — L97212 Non-pressure chronic ulcer of right calf with fat layer exposed: Secondary | ICD-10-CM | POA: Diagnosis not present

## 2015-08-25 DIAGNOSIS — I10 Essential (primary) hypertension: Secondary | ICD-10-CM | POA: Diagnosis not present

## 2015-10-09 DIAGNOSIS — E119 Type 2 diabetes mellitus without complications: Secondary | ICD-10-CM | POA: Diagnosis not present

## 2015-10-09 DIAGNOSIS — Z8711 Personal history of peptic ulcer disease: Secondary | ICD-10-CM | POA: Diagnosis not present

## 2015-10-09 DIAGNOSIS — Z87440 Personal history of urinary (tract) infections: Secondary | ICD-10-CM | POA: Diagnosis not present

## 2015-10-09 DIAGNOSIS — Z872 Personal history of diseases of the skin and subcutaneous tissue: Secondary | ICD-10-CM | POA: Diagnosis not present

## 2015-10-09 DIAGNOSIS — Z79899 Other long term (current) drug therapy: Secondary | ICD-10-CM | POA: Diagnosis not present

## 2015-10-09 DIAGNOSIS — Z9889 Other specified postprocedural states: Secondary | ICD-10-CM | POA: Diagnosis not present

## 2015-10-09 DIAGNOSIS — M0579 Rheumatoid arthritis with rheumatoid factor of multiple sites without organ or systems involvement: Secondary | ICD-10-CM | POA: Diagnosis not present

## 2015-10-09 DIAGNOSIS — M545 Low back pain: Secondary | ICD-10-CM | POA: Diagnosis not present

## 2015-10-09 DIAGNOSIS — M542 Cervicalgia: Secondary | ICD-10-CM | POA: Diagnosis not present

## 2015-10-09 DIAGNOSIS — G629 Polyneuropathy, unspecified: Secondary | ICD-10-CM | POA: Diagnosis not present

## 2015-10-09 DIAGNOSIS — Z8669 Personal history of other diseases of the nervous system and sense organs: Secondary | ICD-10-CM | POA: Diagnosis not present

## 2015-10-09 DIAGNOSIS — M1990 Unspecified osteoarthritis, unspecified site: Secondary | ICD-10-CM | POA: Diagnosis not present

## 2015-10-09 DIAGNOSIS — M3500 Sicca syndrome, unspecified: Secondary | ICD-10-CM | POA: Diagnosis not present

## 2015-10-09 DIAGNOSIS — E039 Hypothyroidism, unspecified: Secondary | ICD-10-CM | POA: Diagnosis not present

## 2015-10-09 DIAGNOSIS — Z7952 Long term (current) use of systemic steroids: Secondary | ICD-10-CM | POA: Diagnosis not present

## 2015-10-13 DIAGNOSIS — M533 Sacrococcygeal disorders, not elsewhere classified: Secondary | ICD-10-CM | POA: Diagnosis not present

## 2015-10-13 DIAGNOSIS — M059 Rheumatoid arthritis with rheumatoid factor, unspecified: Secondary | ICD-10-CM | POA: Diagnosis not present

## 2015-10-13 DIAGNOSIS — Z6830 Body mass index (BMI) 30.0-30.9, adult: Secondary | ICD-10-CM | POA: Diagnosis not present

## 2015-11-07 DIAGNOSIS — M533 Sacrococcygeal disorders, not elsewhere classified: Secondary | ICD-10-CM | POA: Diagnosis not present

## 2015-11-11 DIAGNOSIS — Z1231 Encounter for screening mammogram for malignant neoplasm of breast: Secondary | ICD-10-CM | POA: Diagnosis not present

## 2015-11-11 DIAGNOSIS — R928 Other abnormal and inconclusive findings on diagnostic imaging of breast: Secondary | ICD-10-CM | POA: Diagnosis not present

## 2015-11-12 DIAGNOSIS — Z794 Long term (current) use of insulin: Secondary | ICD-10-CM | POA: Diagnosis not present

## 2015-11-12 DIAGNOSIS — I129 Hypertensive chronic kidney disease with stage 1 through stage 4 chronic kidney disease, or unspecified chronic kidney disease: Secondary | ICD-10-CM | POA: Diagnosis not present

## 2015-11-12 DIAGNOSIS — M069 Rheumatoid arthritis, unspecified: Secondary | ICD-10-CM | POA: Diagnosis not present

## 2015-11-12 DIAGNOSIS — Z79899 Other long term (current) drug therapy: Secondary | ICD-10-CM | POA: Diagnosis not present

## 2015-11-12 DIAGNOSIS — E1122 Type 2 diabetes mellitus with diabetic chronic kidney disease: Secondary | ICD-10-CM | POA: Diagnosis not present

## 2015-11-12 DIAGNOSIS — D631 Anemia in chronic kidney disease: Secondary | ICD-10-CM | POA: Diagnosis not present

## 2015-11-12 DIAGNOSIS — N183 Chronic kidney disease, stage 3 (moderate): Secondary | ICD-10-CM | POA: Diagnosis not present

## 2015-11-12 DIAGNOSIS — E785 Hyperlipidemia, unspecified: Secondary | ICD-10-CM | POA: Diagnosis not present

## 2015-11-12 DIAGNOSIS — I272 Other secondary pulmonary hypertension: Secondary | ICD-10-CM | POA: Diagnosis not present

## 2015-11-12 DIAGNOSIS — E039 Hypothyroidism, unspecified: Secondary | ICD-10-CM | POA: Diagnosis not present

## 2015-11-12 DIAGNOSIS — R600 Localized edema: Secondary | ICD-10-CM | POA: Diagnosis not present

## 2016-01-08 DIAGNOSIS — Z794 Long term (current) use of insulin: Secondary | ICD-10-CM | POA: Diagnosis not present

## 2016-01-08 DIAGNOSIS — M75101 Unspecified rotator cuff tear or rupture of right shoulder, not specified as traumatic: Secondary | ICD-10-CM | POA: Diagnosis not present

## 2016-01-08 DIAGNOSIS — M75100 Unspecified rotator cuff tear or rupture of unspecified shoulder, not specified as traumatic: Secondary | ICD-10-CM | POA: Diagnosis not present

## 2016-01-08 DIAGNOSIS — E039 Hypothyroidism, unspecified: Secondary | ICD-10-CM | POA: Diagnosis not present

## 2016-01-08 DIAGNOSIS — N183 Chronic kidney disease, stage 3 (moderate): Secondary | ICD-10-CM | POA: Diagnosis not present

## 2016-01-08 DIAGNOSIS — E1140 Type 2 diabetes mellitus with diabetic neuropathy, unspecified: Secondary | ICD-10-CM | POA: Diagnosis not present

## 2016-01-08 DIAGNOSIS — G8929 Other chronic pain: Secondary | ICD-10-CM | POA: Diagnosis not present

## 2016-01-08 DIAGNOSIS — M3500 Sicca syndrome, unspecified: Secondary | ICD-10-CM | POA: Diagnosis not present

## 2016-01-08 DIAGNOSIS — I129 Hypertensive chronic kidney disease with stage 1 through stage 4 chronic kidney disease, or unspecified chronic kidney disease: Secondary | ICD-10-CM | POA: Diagnosis not present

## 2016-01-08 DIAGNOSIS — M542 Cervicalgia: Secondary | ICD-10-CM | POA: Diagnosis not present

## 2016-01-08 DIAGNOSIS — M0579 Rheumatoid arthritis with rheumatoid factor of multiple sites without organ or systems involvement: Secondary | ICD-10-CM | POA: Diagnosis not present

## 2016-01-08 DIAGNOSIS — Z79899 Other long term (current) drug therapy: Secondary | ICD-10-CM | POA: Diagnosis not present

## 2016-01-08 DIAGNOSIS — M545 Low back pain: Secondary | ICD-10-CM | POA: Diagnosis not present

## 2016-01-08 DIAGNOSIS — E1122 Type 2 diabetes mellitus with diabetic chronic kidney disease: Secondary | ICD-10-CM | POA: Diagnosis not present

## 2016-01-08 DIAGNOSIS — M8580 Other specified disorders of bone density and structure, unspecified site: Secondary | ICD-10-CM | POA: Diagnosis not present

## 2016-01-08 DIAGNOSIS — E785 Hyperlipidemia, unspecified: Secondary | ICD-10-CM | POA: Diagnosis not present

## 2016-01-09 DIAGNOSIS — G5790 Unspecified mononeuropathy of unspecified lower limb: Secondary | ICD-10-CM | POA: Diagnosis not present

## 2016-01-09 DIAGNOSIS — M533 Sacrococcygeal disorders, not elsewhere classified: Secondary | ICD-10-CM | POA: Diagnosis not present

## 2016-01-09 DIAGNOSIS — M059 Rheumatoid arthritis with rheumatoid factor, unspecified: Secondary | ICD-10-CM | POA: Diagnosis not present

## 2016-02-04 DIAGNOSIS — H269 Unspecified cataract: Secondary | ICD-10-CM | POA: Diagnosis not present

## 2016-02-06 DIAGNOSIS — I872 Venous insufficiency (chronic) (peripheral): Secondary | ICD-10-CM | POA: Diagnosis not present

## 2016-02-06 DIAGNOSIS — Z23 Encounter for immunization: Secondary | ICD-10-CM | POA: Diagnosis not present

## 2016-04-01 DIAGNOSIS — M79644 Pain in right finger(s): Secondary | ICD-10-CM | POA: Diagnosis not present

## 2016-04-01 DIAGNOSIS — R071 Chest pain on breathing: Secondary | ICD-10-CM | POA: Diagnosis not present

## 2016-04-01 DIAGNOSIS — M25551 Pain in right hip: Secondary | ICD-10-CM | POA: Diagnosis not present

## 2016-04-08 DIAGNOSIS — M75101 Unspecified rotator cuff tear or rupture of right shoulder, not specified as traumatic: Secondary | ICD-10-CM | POA: Diagnosis not present

## 2016-04-08 DIAGNOSIS — E785 Hyperlipidemia, unspecified: Secondary | ICD-10-CM | POA: Diagnosis not present

## 2016-04-08 DIAGNOSIS — M1990 Unspecified osteoarthritis, unspecified site: Secondary | ICD-10-CM | POA: Diagnosis not present

## 2016-04-08 DIAGNOSIS — J90 Pleural effusion, not elsewhere classified: Secondary | ICD-10-CM | POA: Diagnosis not present

## 2016-04-08 DIAGNOSIS — Z794 Long term (current) use of insulin: Secondary | ICD-10-CM | POA: Diagnosis not present

## 2016-04-08 DIAGNOSIS — M8580 Other specified disorders of bone density and structure, unspecified site: Secondary | ICD-10-CM | POA: Diagnosis not present

## 2016-04-08 DIAGNOSIS — M3500 Sicca syndrome, unspecified: Secondary | ICD-10-CM | POA: Diagnosis not present

## 2016-04-08 DIAGNOSIS — M0579 Rheumatoid arthritis with rheumatoid factor of multiple sites without organ or systems involvement: Secondary | ICD-10-CM | POA: Diagnosis not present

## 2016-04-08 DIAGNOSIS — I129 Hypertensive chronic kidney disease with stage 1 through stage 4 chronic kidney disease, or unspecified chronic kidney disease: Secondary | ICD-10-CM | POA: Diagnosis not present

## 2016-04-08 DIAGNOSIS — N183 Chronic kidney disease, stage 3 (moderate): Secondary | ICD-10-CM | POA: Diagnosis not present

## 2016-04-08 DIAGNOSIS — E039 Hypothyroidism, unspecified: Secondary | ICD-10-CM | POA: Diagnosis not present

## 2016-04-08 DIAGNOSIS — E1122 Type 2 diabetes mellitus with diabetic chronic kidney disease: Secondary | ICD-10-CM | POA: Diagnosis not present

## 2016-04-08 DIAGNOSIS — Z79899 Other long term (current) drug therapy: Secondary | ICD-10-CM | POA: Diagnosis not present

## 2016-04-08 DIAGNOSIS — M059 Rheumatoid arthritis with rheumatoid factor, unspecified: Secondary | ICD-10-CM | POA: Diagnosis not present

## 2016-04-08 DIAGNOSIS — Z7952 Long term (current) use of systemic steroids: Secondary | ICD-10-CM | POA: Diagnosis not present

## 2016-04-08 DIAGNOSIS — Z5181 Encounter for therapeutic drug level monitoring: Secondary | ICD-10-CM | POA: Diagnosis not present

## 2016-04-08 DIAGNOSIS — I272 Pulmonary hypertension, unspecified: Secondary | ICD-10-CM | POA: Diagnosis not present

## 2016-04-09 DIAGNOSIS — I878 Other specified disorders of veins: Secondary | ICD-10-CM | POA: Diagnosis not present

## 2016-04-09 DIAGNOSIS — M069 Rheumatoid arthritis, unspecified: Secondary | ICD-10-CM | POA: Diagnosis not present

## 2016-04-09 DIAGNOSIS — J90 Pleural effusion, not elsewhere classified: Secondary | ICD-10-CM | POA: Diagnosis not present

## 2016-04-09 DIAGNOSIS — R0600 Dyspnea, unspecified: Secondary | ICD-10-CM | POA: Diagnosis not present

## 2016-04-09 HISTORY — DX: Pleural effusion, not elsewhere classified: J90

## 2016-04-15 DIAGNOSIS — Z6830 Body mass index (BMI) 30.0-30.9, adult: Secondary | ICD-10-CM | POA: Diagnosis not present

## 2016-04-15 DIAGNOSIS — M059 Rheumatoid arthritis with rheumatoid factor, unspecified: Secondary | ICD-10-CM | POA: Diagnosis not present

## 2016-04-15 DIAGNOSIS — M533 Sacrococcygeal disorders, not elsewhere classified: Secondary | ICD-10-CM | POA: Diagnosis not present

## 2016-04-15 DIAGNOSIS — M5030 Other cervical disc degeneration, unspecified cervical region: Secondary | ICD-10-CM | POA: Diagnosis not present

## 2016-04-16 ENCOUNTER — Ambulatory Visit (INDEPENDENT_AMBULATORY_CARE_PROVIDER_SITE_OTHER): Payer: Medicare Other | Admitting: Sports Medicine

## 2016-04-16 DIAGNOSIS — E1142 Type 2 diabetes mellitus with diabetic polyneuropathy: Secondary | ICD-10-CM | POA: Diagnosis not present

## 2016-04-16 DIAGNOSIS — M79671 Pain in right foot: Secondary | ICD-10-CM | POA: Diagnosis not present

## 2016-04-16 DIAGNOSIS — M2142 Flat foot [pes planus] (acquired), left foot: Secondary | ICD-10-CM

## 2016-04-16 DIAGNOSIS — B351 Tinea unguium: Secondary | ICD-10-CM | POA: Diagnosis not present

## 2016-04-16 DIAGNOSIS — M2141 Flat foot [pes planus] (acquired), right foot: Secondary | ICD-10-CM

## 2016-04-16 DIAGNOSIS — I739 Peripheral vascular disease, unspecified: Secondary | ICD-10-CM

## 2016-04-16 DIAGNOSIS — M79672 Pain in left foot: Secondary | ICD-10-CM

## 2016-04-16 DIAGNOSIS — M21969 Unspecified acquired deformity of unspecified lower leg: Secondary | ICD-10-CM

## 2016-04-16 NOTE — Patient Instructions (Signed)

## 2016-04-16 NOTE — Progress Notes (Signed)
Subjective: [name] is a 75 y.o. female patient with history of diabetes who presents to office today complaining of long, painful nails  while ambulating in shoes; unable to trim. Patient states that the glucose reading this morning was not recorded but runs 106-325mg /dl. Patient denies any new changes in medication or new problems. Patient denies any new cramping, numbness, burning or tingling in the legs.  Patient Active Problem List   Diagnosis Date Noted  . Coronary artery disease 12/08/2010  . Dyspnea 12/08/2010  . HYPERTENSION, BENIGN 04/01/2010  . RHEUMATOID ARTHRITIS 04/01/2010  . ABNORMAL CV (STRESS) TEST 04/01/2010  . LEG PAIN, RIGHT 03/12/2010   Current Outpatient Prescriptions on File Prior to Visit  Medication Sig Dispense Refill  . B-D ULTRAFINE III SHORT PEN 31G X 8 MM MISC     . cloNIDine (CATAPRES) 0.1 MG tablet Take 1 tablet by mouth Three times a day.    . diazepam (VALIUM) 5 MG tablet Take 5 mg by mouth 3 (three) times daily.      Marland Kitchen ezetimibe-simvastatin (VYTORIN) 10-20 MG per tablet Take 1 tablet by mouth at bedtime.      Marland Kitchen FREESTYLE LITE test strip     . furosemide (LASIX) 40 MG tablet Take 40 mg by mouth daily.      . insulin NPH-insulin regular (NOVOLIN 70/30) (70-30) 100 UNIT/ML injection Inject 48 Units into the skin daily with breakfast. 10 units in p.m.     Marland Kitchen L-Methylfolate-B6-B12 (METANX) 2.8-25-2 MG TABS Take 1 tablet by mouth daily.      . lansoprazole (PREVACID) 30 MG capsule Take 30 mg by mouth daily.      Marland Kitchen levothyroxine (SYNTHROID, LEVOTHROID) 75 MCG tablet Take 75 mcg by mouth daily.      . metFORMIN (GLUCOPHAGE) 1000 MG tablet Take 500 mg by mouth 3 (three) times daily. Take 1/2 tab twice a day    . oxyCODONE (OXY IR/ROXICODONE) 5 MG immediate release tablet Take 5 mg by mouth 2 (two) times daily.      Marland Kitchen oxyCODONE (OXYCONTIN) 40 MG 12 hr tablet Take 40 mg by mouth every 8 (eight) hours.      . potassium chloride (KLOR-CON 10) 10 MEQ CR tablet  Take 10 mEq by mouth 2 (two) times daily. When taking furosemide     . predniSONE (DELTASONE) 5 MG tablet Take 5 mg by mouth 2 (two) times daily.      Marland Kitchen triamcinolone (KENALOG) 0.1 % cream     . valsartan (DIOVAN) 320 MG tablet Take 1 tablet (320 mg total) by mouth daily. 30 tablet 1   No current facility-administered medications on file prior to visit.    Allergies  Allergen Reactions  . Gold Anaphylaxis    Swelling of lips/tongue, throat Swelling of lips/tongue, throat  . Nsaids Other (See Comments)    Intolerance Intolerance  . Sulfa Antibiotics Nausea Only  . Sulfonamide Derivatives   . Latex Other (See Comments)    blisters  . Sulfamethoxazole Nausea Only    No results found for this or any previous visit (from the past 2160 hour(s)).  Objective: General: Patient is awake, alert, and oriented x 3 and in no acute distress.  Integument: Skin is warm, dry and supple bilateral. Nails are tender, long, thickened and dystrophic with subungual debris, consistent with onychomycosis, 1-5 bilateral. No signs of infection. No open lesions or preulcerative lesions present bilateral. Remaining integument unremarkable.  Vasculature:  Dorsalis Pedis pulse 1/4 bilateral. Posterior Tibial pulse  0/4 bilateral. Capillary fill time <5 sec 1-5 bilateral. No hair growth to the level of the digits. Chronic venous skin changes. Temperature gradient within normal limits. + varicosities present bilateral. 2+ pitting edema present bilateral.   Neurology: The patient has absent sensation measured with a 5.07/10g Semmes Weinstein Monofilament at all pedal sites bilateral . Vibratory sensation diminished bilateral with tuning fork. No Babinski sign present bilateral.   Musculoskeletal: Asymptomatic planus and digital deformities secondary to previous surgery noted bilateral. Muscular strength 4/5 in all lower extremity muscular groups bilateral without pain on range of motion . No tenderness with calf  compression bilateral.  Assessment and Plan: Problem List Items Addressed This Visit    None    Visit Diagnoses    Dermatophytosis of nail    -  Primary   Foot pain, bilateral       Diabetic polyneuropathy associated with type 2 diabetes mellitus (HCC)       PVD (peripheral vascular disease) (HCC)       Deformity of foot, unspecified laterality       Pes planus of both feet         -Examined patient. -Discussed and educated patient on diabetic foot care, especially with  regards to the vascular, neurological and musculoskeletal systems.  -Stressed the importance of good glycemic control and the detriment of not controlling glucose levels in relation to the foot. -Mechanically debrided all nails 1-5 bilateral using sterile nail nipper and filed with dremel without incident  -[employer] diabetic shoe order form was completed; office to contact primary care for approval / certification;  Office to arrange shoe fitting and dispensing. -Answered all patient questions -Patient to return in 3 months for at risk foot care -Patient advised to call the office if any problems or questions arise in the meantime.  Asencion Islam, DPM

## 2016-04-20 DIAGNOSIS — J90 Pleural effusion, not elsewhere classified: Secondary | ICD-10-CM | POA: Diagnosis not present

## 2016-04-20 DIAGNOSIS — R918 Other nonspecific abnormal finding of lung field: Secondary | ICD-10-CM | POA: Diagnosis not present

## 2016-06-04 DIAGNOSIS — M25471 Effusion, right ankle: Secondary | ICD-10-CM | POA: Diagnosis not present

## 2016-06-04 DIAGNOSIS — I878 Other specified disorders of veins: Secondary | ICD-10-CM | POA: Diagnosis not present

## 2016-06-04 DIAGNOSIS — M25472 Effusion, left ankle: Secondary | ICD-10-CM | POA: Diagnosis not present

## 2016-06-04 DIAGNOSIS — Z86718 Personal history of other venous thrombosis and embolism: Secondary | ICD-10-CM | POA: Diagnosis not present

## 2016-06-04 DIAGNOSIS — R262 Difficulty in walking, not elsewhere classified: Secondary | ICD-10-CM | POA: Diagnosis not present

## 2016-06-28 DIAGNOSIS — L089 Local infection of the skin and subcutaneous tissue, unspecified: Secondary | ICD-10-CM | POA: Diagnosis not present

## 2016-06-28 DIAGNOSIS — M7989 Other specified soft tissue disorders: Secondary | ICD-10-CM | POA: Diagnosis not present

## 2016-06-28 DIAGNOSIS — I878 Other specified disorders of veins: Secondary | ICD-10-CM | POA: Diagnosis not present

## 2016-06-30 DIAGNOSIS — R319 Hematuria, unspecified: Secondary | ICD-10-CM | POA: Diagnosis not present

## 2016-06-30 DIAGNOSIS — N3090 Cystitis, unspecified without hematuria: Secondary | ICD-10-CM | POA: Diagnosis not present

## 2016-06-30 DIAGNOSIS — R300 Dysuria: Secondary | ICD-10-CM | POA: Diagnosis not present

## 2016-07-02 ENCOUNTER — Telehealth: Payer: Self-pay

## 2016-07-02 NOTE — Telephone Encounter (Signed)
Wanted to let her know that her diabetic paperwork was cancelled and if she wanted to look into getting diabetic shoes we could start new paperwork when she comes in at her next visit.

## 2016-07-06 DIAGNOSIS — J90 Pleural effusion, not elsewhere classified: Secondary | ICD-10-CM | POA: Diagnosis not present

## 2016-07-06 DIAGNOSIS — Z882 Allergy status to sulfonamides status: Secondary | ICD-10-CM | POA: Diagnosis not present

## 2016-07-06 DIAGNOSIS — Z79899 Other long term (current) drug therapy: Secondary | ICD-10-CM | POA: Diagnosis not present

## 2016-07-06 DIAGNOSIS — E1142 Type 2 diabetes mellitus with diabetic polyneuropathy: Secondary | ICD-10-CM | POA: Diagnosis not present

## 2016-07-06 DIAGNOSIS — M0579 Rheumatoid arthritis with rheumatoid factor of multiple sites without organ or systems involvement: Secondary | ICD-10-CM | POA: Diagnosis not present

## 2016-07-06 DIAGNOSIS — Z13820 Encounter for screening for osteoporosis: Secondary | ICD-10-CM | POA: Diagnosis not present

## 2016-07-06 DIAGNOSIS — R918 Other nonspecific abnormal finding of lung field: Secondary | ICD-10-CM | POA: Diagnosis not present

## 2016-07-06 DIAGNOSIS — M75101 Unspecified rotator cuff tear or rupture of right shoulder, not specified as traumatic: Secondary | ICD-10-CM | POA: Diagnosis not present

## 2016-07-06 DIAGNOSIS — M8580 Other specified disorders of bone density and structure, unspecified site: Secondary | ICD-10-CM | POA: Diagnosis not present

## 2016-07-06 DIAGNOSIS — Z7952 Long term (current) use of systemic steroids: Secondary | ICD-10-CM | POA: Diagnosis not present

## 2016-07-06 DIAGNOSIS — M3500 Sicca syndrome, unspecified: Secondary | ICD-10-CM | POA: Diagnosis not present

## 2016-07-06 DIAGNOSIS — E559 Vitamin D deficiency, unspecified: Secondary | ICD-10-CM | POA: Diagnosis not present

## 2016-07-06 DIAGNOSIS — M059 Rheumatoid arthritis with rheumatoid factor, unspecified: Secondary | ICD-10-CM | POA: Diagnosis not present

## 2016-07-14 DIAGNOSIS — M059 Rheumatoid arthritis with rheumatoid factor, unspecified: Secondary | ICD-10-CM | POA: Diagnosis not present

## 2016-07-14 DIAGNOSIS — G5790 Unspecified mononeuropathy of unspecified lower limb: Secondary | ICD-10-CM | POA: Diagnosis not present

## 2016-07-14 DIAGNOSIS — Z6830 Body mass index (BMI) 30.0-30.9, adult: Secondary | ICD-10-CM | POA: Diagnosis not present

## 2016-07-15 ENCOUNTER — Ambulatory Visit: Payer: Medicare Other | Admitting: Sports Medicine

## 2016-08-18 DIAGNOSIS — D649 Anemia, unspecified: Secondary | ICD-10-CM | POA: Diagnosis not present

## 2016-08-18 DIAGNOSIS — Z87440 Personal history of urinary (tract) infections: Secondary | ICD-10-CM | POA: Diagnosis not present

## 2016-08-18 DIAGNOSIS — E1122 Type 2 diabetes mellitus with diabetic chronic kidney disease: Secondary | ICD-10-CM | POA: Diagnosis not present

## 2016-08-18 DIAGNOSIS — Z791 Long term (current) use of non-steroidal anti-inflammatories (NSAID): Secondary | ICD-10-CM | POA: Diagnosis not present

## 2016-08-18 DIAGNOSIS — Z794 Long term (current) use of insulin: Secondary | ICD-10-CM | POA: Diagnosis not present

## 2016-08-18 DIAGNOSIS — R809 Proteinuria, unspecified: Secondary | ICD-10-CM | POA: Diagnosis not present

## 2016-08-18 DIAGNOSIS — R609 Edema, unspecified: Secondary | ICD-10-CM | POA: Diagnosis not present

## 2016-08-18 DIAGNOSIS — N183 Chronic kidney disease, stage 3 (moderate): Secondary | ICD-10-CM | POA: Diagnosis not present

## 2016-08-18 DIAGNOSIS — N390 Urinary tract infection, site not specified: Secondary | ICD-10-CM | POA: Diagnosis not present

## 2016-08-18 DIAGNOSIS — M069 Rheumatoid arthritis, unspecified: Secondary | ICD-10-CM | POA: Diagnosis not present

## 2016-08-18 DIAGNOSIS — I129 Hypertensive chronic kidney disease with stage 1 through stage 4 chronic kidney disease, or unspecified chronic kidney disease: Secondary | ICD-10-CM | POA: Diagnosis not present

## 2016-09-16 DIAGNOSIS — Z6828 Body mass index (BMI) 28.0-28.9, adult: Secondary | ICD-10-CM | POA: Diagnosis not present

## 2016-09-16 DIAGNOSIS — M059 Rheumatoid arthritis with rheumatoid factor, unspecified: Secondary | ICD-10-CM | POA: Diagnosis not present

## 2016-09-16 DIAGNOSIS — M5030 Other cervical disc degeneration, unspecified cervical region: Secondary | ICD-10-CM | POA: Diagnosis not present

## 2016-09-24 DIAGNOSIS — N3001 Acute cystitis with hematuria: Secondary | ICD-10-CM | POA: Diagnosis not present

## 2016-10-12 DIAGNOSIS — E039 Hypothyroidism, unspecified: Secondary | ICD-10-CM | POA: Diagnosis not present

## 2016-10-12 DIAGNOSIS — R918 Other nonspecific abnormal finding of lung field: Secondary | ICD-10-CM | POA: Diagnosis not present

## 2016-10-12 DIAGNOSIS — M75101 Unspecified rotator cuff tear or rupture of right shoulder, not specified as traumatic: Secondary | ICD-10-CM | POA: Diagnosis not present

## 2016-10-12 DIAGNOSIS — E1122 Type 2 diabetes mellitus with diabetic chronic kidney disease: Secondary | ICD-10-CM | POA: Diagnosis not present

## 2016-10-12 DIAGNOSIS — M3500 Sicca syndrome, unspecified: Secondary | ICD-10-CM | POA: Diagnosis not present

## 2016-10-12 DIAGNOSIS — E785 Hyperlipidemia, unspecified: Secondary | ICD-10-CM | POA: Diagnosis not present

## 2016-10-12 DIAGNOSIS — Z794 Long term (current) use of insulin: Secondary | ICD-10-CM | POA: Diagnosis not present

## 2016-10-12 DIAGNOSIS — M25522 Pain in left elbow: Secondary | ICD-10-CM | POA: Diagnosis not present

## 2016-10-12 DIAGNOSIS — Z7952 Long term (current) use of systemic steroids: Secondary | ICD-10-CM | POA: Diagnosis not present

## 2016-10-12 DIAGNOSIS — M8580 Other specified disorders of bone density and structure, unspecified site: Secondary | ICD-10-CM | POA: Diagnosis not present

## 2016-10-12 DIAGNOSIS — J90 Pleural effusion, not elsewhere classified: Secondary | ICD-10-CM | POA: Diagnosis not present

## 2016-10-12 DIAGNOSIS — M545 Low back pain: Secondary | ICD-10-CM | POA: Diagnosis not present

## 2016-10-12 DIAGNOSIS — I129 Hypertensive chronic kidney disease with stage 1 through stage 4 chronic kidney disease, or unspecified chronic kidney disease: Secondary | ICD-10-CM | POA: Diagnosis not present

## 2016-10-12 DIAGNOSIS — G629 Polyneuropathy, unspecified: Secondary | ICD-10-CM | POA: Diagnosis not present

## 2016-10-12 DIAGNOSIS — M0579 Rheumatoid arthritis with rheumatoid factor of multiple sites without organ or systems involvement: Secondary | ICD-10-CM | POA: Diagnosis not present

## 2016-10-12 DIAGNOSIS — N183 Chronic kidney disease, stage 3 (moderate): Secondary | ICD-10-CM | POA: Diagnosis not present

## 2016-10-12 DIAGNOSIS — M542 Cervicalgia: Secondary | ICD-10-CM | POA: Diagnosis not present

## 2016-10-12 DIAGNOSIS — Z79899 Other long term (current) drug therapy: Secondary | ICD-10-CM | POA: Diagnosis not present

## 2016-10-12 DIAGNOSIS — G8929 Other chronic pain: Secondary | ICD-10-CM | POA: Diagnosis not present

## 2016-10-12 DIAGNOSIS — M8589 Other specified disorders of bone density and structure, multiple sites: Secondary | ICD-10-CM | POA: Diagnosis not present

## 2016-11-26 ENCOUNTER — Ambulatory Visit: Payer: Medicare Other | Admitting: Sports Medicine

## 2016-12-17 ENCOUNTER — Encounter (INDEPENDENT_AMBULATORY_CARE_PROVIDER_SITE_OTHER): Payer: Self-pay

## 2016-12-17 ENCOUNTER — Ambulatory Visit (INDEPENDENT_AMBULATORY_CARE_PROVIDER_SITE_OTHER): Payer: Medicare Other | Admitting: Sports Medicine

## 2016-12-17 ENCOUNTER — Ambulatory Visit (INDEPENDENT_AMBULATORY_CARE_PROVIDER_SITE_OTHER): Payer: Medicare Other

## 2016-12-17 ENCOUNTER — Encounter: Payer: Self-pay | Admitting: Sports Medicine

## 2016-12-17 ENCOUNTER — Other Ambulatory Visit: Payer: Self-pay | Admitting: Sports Medicine

## 2016-12-17 VITALS — Temp 98.6°F

## 2016-12-17 DIAGNOSIS — E1142 Type 2 diabetes mellitus with diabetic polyneuropathy: Secondary | ICD-10-CM

## 2016-12-17 DIAGNOSIS — B351 Tinea unguium: Secondary | ICD-10-CM

## 2016-12-17 DIAGNOSIS — L97511 Non-pressure chronic ulcer of other part of right foot limited to breakdown of skin: Secondary | ICD-10-CM | POA: Diagnosis not present

## 2016-12-17 DIAGNOSIS — M2142 Flat foot [pes planus] (acquired), left foot: Secondary | ICD-10-CM

## 2016-12-17 DIAGNOSIS — M79674 Pain in right toe(s): Secondary | ICD-10-CM

## 2016-12-17 DIAGNOSIS — L02611 Cutaneous abscess of right foot: Secondary | ICD-10-CM

## 2016-12-17 DIAGNOSIS — M79675 Pain in left toe(s): Secondary | ICD-10-CM

## 2016-12-17 DIAGNOSIS — Z6830 Body mass index (BMI) 30.0-30.9, adult: Secondary | ICD-10-CM | POA: Diagnosis not present

## 2016-12-17 DIAGNOSIS — I739 Peripheral vascular disease, unspecified: Secondary | ICD-10-CM

## 2016-12-17 DIAGNOSIS — M21969 Unspecified acquired deformity of unspecified lower leg: Secondary | ICD-10-CM

## 2016-12-17 DIAGNOSIS — L03031 Cellulitis of right toe: Secondary | ICD-10-CM

## 2016-12-17 DIAGNOSIS — M5030 Other cervical disc degeneration, unspecified cervical region: Secondary | ICD-10-CM | POA: Diagnosis not present

## 2016-12-17 DIAGNOSIS — M2141 Flat foot [pes planus] (acquired), right foot: Secondary | ICD-10-CM

## 2016-12-17 DIAGNOSIS — M059 Rheumatoid arthritis with rheumatoid factor, unspecified: Secondary | ICD-10-CM | POA: Diagnosis not present

## 2016-12-17 MED ORDER — DOXYCYCLINE HYCLATE 100 MG PO TABS: 100.0000 mg | ORAL_TABLET | Freq: Two times a day (BID) | ORAL | 0 refills | Status: DC

## 2016-12-17 MED ORDER — MUPIROCIN 2 % EX OINT: TOPICAL_OINTMENT | CUTANEOUS | 0 refills | Status: DC

## 2016-12-17 NOTE — Progress Notes (Signed)
Subjective: [name] is a 76 y.o. female patient with history of diabetes who presents to office today complaining of long, painful nails  while ambulating in shoes; unable to trim. Patient states that the glucose reading this morning was not recorded but runs 127-220's mg/dl. Admits a new issue of swelling and redness of right 1st toe that has gotten worse over the last 2 days; had a callus that started to drain that she has been covering with a bandaid. Patient states that she has been on her feet more because she has been visiting with her husband who is in rehab. Patient is assisted by daughter. Patient denies any constitutional symptoms at this time.   Patient Active Problem List   Diagnosis Date Noted  . Coronary artery disease 12/08/2010  . Dyspnea 12/08/2010  . HYPERTENSION, BENIGN 04/01/2010  . RHEUMATOID ARTHRITIS 04/01/2010  . ABNORMAL CV (STRESS) TEST 04/01/2010  . LEG PAIN, RIGHT 03/12/2010   Current Outpatient Prescriptions on File Prior to Visit  Medication Sig Dispense Refill  . B-D ULTRAFINE III SHORT PEN 31G X 8 MM MISC     . cloNIDine (CATAPRES) 0.1 MG tablet Take 1 tablet by mouth Three times a day.    . diazepam (VALIUM) 5 MG tablet Take 5 mg by mouth 3 (three) times daily.      Marland Kitchen ezetimibe-simvastatin (VYTORIN) 10-20 MG per tablet Take 1 tablet by mouth at bedtime.      Marland Kitchen FREESTYLE LITE test strip     . furosemide (LASIX) 40 MG tablet Take 40 mg by mouth daily.      . insulin NPH-insulin regular (NOVOLIN 70/30) (70-30) 100 UNIT/ML injection Inject 48 Units into the skin daily with breakfast. 10 units in p.m.     Marland Kitchen L-Methylfolate-B6-B12 (METANX) 2.8-25-2 MG TABS Take 1 tablet by mouth daily.      . lansoprazole (PREVACID) 30 MG capsule Take 30 mg by mouth daily.      Marland Kitchen levothyroxine (SYNTHROID, LEVOTHROID) 75 MCG tablet Take 75 mcg by mouth daily.      . metFORMIN (GLUCOPHAGE) 1000 MG tablet Take 500 mg by mouth 3 (three) times daily. Take 1/2 tab twice a day    .  oxyCODONE (OXY IR/ROXICODONE) 5 MG immediate release tablet Take 5 mg by mouth 2 (two) times daily.      Marland Kitchen oxyCODONE (OXYCONTIN) 40 MG 12 hr tablet Take 40 mg by mouth every 8 (eight) hours.      . potassium chloride (KLOR-CON 10) 10 MEQ CR tablet Take 10 mEq by mouth 2 (two) times daily. When taking furosemide     . predniSONE (DELTASONE) 5 MG tablet Take 5 mg by mouth 2 (two) times daily.      Marland Kitchen triamcinolone (KENALOG) 0.1 % cream     . valsartan (DIOVAN) 320 MG tablet Take 1 tablet (320 mg total) by mouth daily. 30 tablet 1   No current facility-administered medications on file prior to visit.    Allergies  Allergen Reactions  . Gold Anaphylaxis    Swelling of lips/tongue, throat Swelling of lips/tongue, throat  . Nsaids Other (See Comments)    Intolerance Intolerance  . Sulfa Antibiotics Nausea Only  . Sulfonamide Derivatives   . Latex Other (See Comments)    blisters  . Sulfamethoxazole Nausea Only    No results found for this or any previous visit (from the past 2160 hour(s)).  Objective: General: Patient is awake, alert, and oriented x 3 and in no acute distress.  Integument: Skin is warm, dry and supple bilateral. Nails are tender, long, thickened and dystrophic with subungual debris, consistent with onychomycosis, 1-5 bilateral.  There is a blister at the plantar aspect of the right first toe. Upon debridement, revealing a soft wound base that is limited to breakdown of skin with fibro-granular tissue and surrounding swelling, warmth and focal erythema. Remaining integument unremarkable.  Vasculature:  Dorsalis Pedis pulse 1/4 bilateral. Posterior Tibial pulse  0/4 bilateral. Capillary fill time <5 sec 1-5 bilateral. No hair growth to the level of the digits. Chronic venous skin changes. Temperature gradient within normal limits. + varicosities present bilateral. 2+ pitting edema present bilateral.   Neurology: The patient has absent sensation measured with a 5.07/10g Semmes  Weinstein Monofilament at all pedal sites bilateral . Vibratory sensation diminished bilateral with tuning fork. No Babinski sign present bilateral.   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 Muscular strength 4/5 in all lower extremity muscular groups bilateral without pain on range of motion . No tenderness with calf compression bilateral.  X-rays right foot. Decreased osseous mineralization. There is significant arthritis and digital deformity secondary to previous foot surgeries. There is no obvious bony destruction at the area of concern. It ulceration of the plantar aspect of the right hallux. No air or gas in the soft tissues. No other acute findings.  Assessment and Plan: Problem List Items Addressed This Visit    None    Visit Diagnoses    Toe ulcer, right, limited to breakdown of skin (HCC)    -  Primary   Relevant Medications   doxycycline (VIBRA-TABS) 100 MG tablet   mupirocin ointment (BACTROBAN) 2 %   Other Relevant Orders   DG Foot Complete Right (Completed)   WOUND CULTURE   Cellulitis and abscess of toe of right foot       Pain due to onychomycosis of toenails of both feet       Relevant Medications   mupirocin ointment (BACTROBAN) 2 %   Diabetic polyneuropathy associated with type 2 diabetes mellitus (HCC)       PVD (peripheral vascular disease) (HCC)       Deformity of foot, unspecified laterality       Pes planus of both feet         -Examined patient. -Discussed and educated patient on diabetic foot care, especially with  regards to the vascular, neurological and musculoskeletal systems.  -Stressed the importance of good glycemic control and the detriment of not controlling glucose levels in relation to the foot. -Mechanically debrided all nails 1-5 bilateral using sterile nail nipper and filed with dremel without incident  -Mechanically debrided  ulceration using a tissue nipper to healthy bleeding borders to the level of the dermis at the right plantar hallux then dressed area with antibiotic cream, 2 x 2 gauze and Coban wrap. Advised patient to do the same daily using Bactroban with assistance from daughter. -Patient was started on doxycycline. A wound culture was also obtained this visit and sent to [facility]; will call patient with results of wound culture. If a change in antibiotics as needed -Answered all patient questions and advised patient if right first toe is not better within the next 2 days, to report to emergency room or return to office sooner. -Patient to return in 2 weeks for follow up on right toe ulcer -Patient advised to call the office if any problems or questions arise  in the meantime.  Landis Martins, DPM

## 2016-12-23 ENCOUNTER — Telehealth: Payer: Self-pay | Admitting: *Deleted

## 2016-12-23 LAB — WOUND CULTURE

## 2016-12-23 NOTE — Telephone Encounter (Signed)
Called and relayed Dr Wynema Birch advise.  Patient stated understanding. Patient asked if it was MRSA I told her it was not she thanked and will complete her med and follow up as scheduled.

## 2016-12-23 NOTE — Telephone Encounter (Signed)
-----   Message from Asencion Islam, [place] sent at 12/21/2016 12:11 PM EDT ----- Let the patient know to continue Doxycyline. Her wound culture came back + but the antibiotic will cover the bacteria that grew out -Dr. Marylene Land

## 2016-12-30 ENCOUNTER — Encounter (INDEPENDENT_AMBULATORY_CARE_PROVIDER_SITE_OTHER): Payer: Self-pay

## 2016-12-30 ENCOUNTER — Ambulatory Visit (INDEPENDENT_AMBULATORY_CARE_PROVIDER_SITE_OTHER): Payer: Medicare Other | Admitting: Sports Medicine

## 2016-12-30 DIAGNOSIS — I739 Peripheral vascular disease, unspecified: Secondary | ICD-10-CM

## 2016-12-30 DIAGNOSIS — L97511 Non-pressure chronic ulcer of other part of right foot limited to breakdown of skin: Secondary | ICD-10-CM | POA: Diagnosis not present

## 2016-12-30 DIAGNOSIS — E1142 Type 2 diabetes mellitus with diabetic polyneuropathy: Secondary | ICD-10-CM

## 2016-12-30 DIAGNOSIS — L03031 Cellulitis of right toe: Secondary | ICD-10-CM

## 2016-12-30 DIAGNOSIS — L02611 Cutaneous abscess of right foot: Secondary | ICD-10-CM

## 2016-12-30 DIAGNOSIS — M21969 Unspecified acquired deformity of unspecified lower leg: Secondary | ICD-10-CM

## 2016-12-30 NOTE — Progress Notes (Signed)
Subjective: [name] is a 76 y.o. female patient seen in office for follow up evaluation of ulceration of the right 1st toe. Patient has a history of diabetes and a blood glucose level today of 147mg /dl.   Patient is changing the dressing using bactroban at home with help of daughter daily. Admits bloody drainage. Finished antibiotics. Denies nausea/fever/vomiting/chills/night sweats/shortness of breath/chest pain. Patient has no other pedal complaints at this time.  Patient Active Problem List   Diagnosis Date Noted  . Coronary artery disease 12/08/2010  . Dyspnea 12/08/2010  . HYPERTENSION, BENIGN 04/01/2010  . RHEUMATOID ARTHRITIS 04/01/2010  . ABNORMAL CV (STRESS) TEST 04/01/2010  . LEG PAIN, RIGHT 03/12/2010   Current Outpatient Prescriptions on File Prior to Visit  Medication Sig Dispense Refill  . B-D ULTRAFINE III SHORT PEN 31G X 8 MM MISC     . cloNIDine (CATAPRES) 0.1 MG tablet Take 1 tablet by mouth Three times a day.    . diazepam (VALIUM) 5 MG tablet Take 5 mg by mouth 3 (three) times daily.      Marland Kitchen doxycycline (VIBRA-TABS) 100 MG tablet Take 1 tablet (100 mg total) by mouth 2 (two) times daily. 20 tablet 0  . ezetimibe-simvastatin (VYTORIN) 10-20 MG per tablet Take 1 tablet by mouth at bedtime.      Marland Kitchen FREESTYLE LITE test strip     . furosemide (LASIX) 40 MG tablet Take 40 mg by mouth daily.      . insulin NPH-insulin regular (NOVOLIN 70/30) (70-30) 100 UNIT/ML injection Inject 48 Units into the skin daily with breakfast. 10 units in p.m.     Marland Kitchen L-Methylfolate-B6-B12 (METANX) 2.8-25-2 MG TABS Take 1 tablet by mouth daily.      . lansoprazole (PREVACID) 30 MG capsule Take 30 mg by mouth daily.      Marland Kitchen levothyroxine (SYNTHROID, LEVOTHROID) 75 MCG tablet Take 75 mcg by mouth daily.      . metFORMIN (GLUCOPHAGE) 1000 MG tablet Take 500 mg by mouth 3 (three) times daily. Take 1/2 tab twice a day    . mupirocin ointment (BACTROBAN) 2 % To right toe ulcer 30 g 0  . oxyCODONE (OXY  IR/ROXICODONE) 5 MG immediate release tablet Take 5 mg by mouth 2 (two) times daily.      Marland Kitchen oxyCODONE (OXYCONTIN) 40 MG 12 hr tablet Take 40 mg by mouth every 8 (eight) hours.      . potassium chloride (KLOR-CON 10) 10 MEQ CR tablet Take 10 mEq by mouth 2 (two) times daily. When taking furosemide     . predniSONE (DELTASONE) 5 MG tablet Take 5 mg by mouth 2 (two) times daily.      Marland Kitchen triamcinolone (KENALOG) 0.1 % cream     . valsartan (DIOVAN) 320 MG tablet Take 1 tablet (320 mg total) by mouth daily. 30 tablet 1   No current facility-administered medications on file prior to visit.    Allergies  Allergen Reactions  . Gold Anaphylaxis    Swelling of lips/tongue, throat Swelling of lips/tongue, throat  . Nsaids Other (See Comments)    Intolerance Intolerance  . Sulfa Antibiotics Nausea Only  . Sulfonamide Derivatives   . Latex Other (See Comments)    blisters  . Sulfamethoxazole Nausea Only    Recent Results (from the past 2160 hour(s))  WOUND CULTURE     Status: Abnormal   Collection Time: 12/17/16 12:00 AM  Result Value Ref Range   Gram Stain Result CANCELED     Comment:  The specimen submitted does not meet the laboratory's criteria for acceptability. Refer to [facility] for specimen acceptability criteria.  Result canceled by the ancillary    Aerobic Bacterial Culture Final report (A)    Organism ID, Bacteria Klebsiella oxytoca (A)     Comment: Heavy growth   Organism ID, Bacteria Routine flora     Comment: Heavy growth   Antimicrobial Susceptibility Comment     Comment:       ** S = Susceptible; I = Intermediate; R = Resistant **                    P = Positive; N = Negative             MICS are expressed in micrograms per mL    Antibiotic                 RSLT#1    RSLT#2    RSLT#3    RSLT#4 Amoxicillin/Clavulanic Acid    R Ampicillin                     R Cefazolin                      R Cefepime                       S Ceftriaxone                     R Cefuroxime                     R Ciprofloxacin                  S Ertapenem                      S Gentamicin                     S Imipenem                       S Levofloxacin                   S Meropenem                      S Tetracycline                   S Tobramycin                     S Trimethoprim/Sulfa             S     Objective: There were no vitals filed for this visit.  General: Patient is awake, alert, oriented x 3 and in no acute distress.  Dermatology: Skin is warm and dry bilateral with a partial thickness ulceration present right plantar hallux. Ulceration measures 2 cm x 2 cm x 0.3 cm. There is a keratotic border with a fibrogranular base. The ulceration does not  probe to bone. There is no malodor, no active drainage, decreased erythema, decreased edema. No acute signs of infection.   Vascular: Dorsalis Pedis pulse = 1/4 Bilateral,  Posterior Tibial pulse = 0/4 Bilateral,  Capillary Fill Time < 5 seconds. Chronic venous skin changes.  Neurologic: Protective sensation absent to the level of the  ankles bilateral using  the 5.07/10g Semmes Weinstein Monofilament.  Musculosketal: No Pain with palpation to ulcerated area. No pain with compression to calves bilateral. Planus and digital deformity with rigid hallux extensus on right.   No results for input(s): GRAMSTAIN, LABORGA in the last 8760 hours.  Assessment and Plan:  Problem List Items Addressed This Visit    None    Visit Diagnoses    Toe ulcer, right, limited to breakdown of skin (HCC)    -  Primary   Cellulitis and abscess of toe of right foot       Diabetic polyneuropathy associated with type 2 diabetes mellitus (HCC)       PVD (peripheral vascular disease) (HCC)       Deformity of foot, unspecified laterality         -Examined patient and discussed the progression of the wound and treatment alternatives. - Excisionally dedbrided ulceration at right plantar hallux to healthy bleeding  borders removing nonviable tissue using a sterile chisel blade. Wound measures post debridement as above. Wound was debrided to the level of the dermis with viable wound base exposed to promote healing. Hemostasis was achieved with manuel pressure. Patient tolerated procedure well without any discomfort or anesthesia necessary for this wound debridement.  -Applied antibiotic cream and dry sterile dressing and instructed patient to continue with daily dressings at home consisting of bactroban and bandaid/dry sterile dressing. -Rx home nursing and PT from [facility] to offer assistance with wound care and physical demands to patient to relieve daughter  - Advised patient to go to the ER or return to office if the wound worsens or if constitutional symptoms are present. -Patient to return to office in 2-3 weeks for follow up care and evaluation or sooner if problems arise.  Asencion Islam, DPM

## 2016-12-31 ENCOUNTER — Telehealth: Payer: Self-pay | Admitting: *Deleted

## 2016-12-31 NOTE — Telephone Encounter (Addendum)
-----   Message from Asencion Islam, [place] sent at 12/30/2016  5:25 PM EDT ----- Regarding: [facility]  Right plantar hallux ulcer 2x2x0.3 apply bactroban and dry dressing 3x per week Thanks Dr. Vale Haven is the contact person for [facility] [phone]/31/2016-Left message requesting call back from [place] [facility] to give orders. Faxed required form, clinicals and demographics to [facility].01/04/2017-Wendy Ingold, dtr states they have not received a call for [facility] scheduling HHC.01/05/2017-I spoke with Charise Killian and she states they were able to contact family and pt is to be seen today. I left message informing [name], pt would be seen by [facility] today.

## 2017-01-05 ENCOUNTER — Telehealth: Payer: Self-pay | Admitting: Sports Medicine

## 2017-01-05 NOTE — Telephone Encounter (Signed)
Pt had a start of care scheduled for 2 pm today. She had an earlier appointment with another doctor in [facility] and received injections. She did not get back until 3 pm. She did not want to start until tomorrow. So pt's start of care will be tomorrow. Return phone number is [phone].

## 2017-01-06 ENCOUNTER — Telehealth: Payer: Self-pay | Admitting: Sports Medicine

## 2017-01-06 DIAGNOSIS — I2510 Atherosclerotic heart disease of native coronary artery without angina pectoris: Secondary | ICD-10-CM | POA: Diagnosis not present

## 2017-01-06 DIAGNOSIS — L97511 Non-pressure chronic ulcer of other part of right foot limited to breakdown of skin: Secondary | ICD-10-CM | POA: Diagnosis not present

## 2017-01-06 DIAGNOSIS — E1151 Type 2 diabetes mellitus with diabetic peripheral angiopathy without gangrene: Secondary | ICD-10-CM | POA: Diagnosis not present

## 2017-01-06 DIAGNOSIS — I739 Peripheral vascular disease, unspecified: Secondary | ICD-10-CM | POA: Diagnosis not present

## 2017-01-06 DIAGNOSIS — M069 Rheumatoid arthritis, unspecified: Secondary | ICD-10-CM | POA: Diagnosis not present

## 2017-01-06 DIAGNOSIS — E11621 Type 2 diabetes mellitus with foot ulcer: Secondary | ICD-10-CM | POA: Diagnosis not present

## 2017-01-06 NOTE — Telephone Encounter (Signed)
Yes wound care orders can be changed to Puracol or Calcium Alginate. I will re-assess patient on 01-21-17 Thanks Dr. Marylene Land

## 2017-01-06 NOTE — Telephone Encounter (Signed)
This is Gavin Pound with [facility]. I need to speak to someone about pt's wound care orders. Please call me back at [phone]. Thank you.

## 2017-01-06 NOTE — Telephone Encounter (Addendum)
[name] - [facility] states she feels the wound has stalled out at 2.0 x 2.0 x 0.3cm and feels Puracol or Calcium Alginate will jump start the healing. I told [name] I would inform Dr. Marylene Land of her suggestions. I called [name] - [facility] with Dr. Wynema Birch orders of 2:51pm. [name] - [facility] states she will start pt tomorrow with the Calcium Alginate and change the dressings 3 times a week.

## 2017-01-07 DIAGNOSIS — I739 Peripheral vascular disease, unspecified: Secondary | ICD-10-CM | POA: Diagnosis not present

## 2017-01-07 DIAGNOSIS — E1151 Type 2 diabetes mellitus with diabetic peripheral angiopathy without gangrene: Secondary | ICD-10-CM | POA: Diagnosis not present

## 2017-01-07 DIAGNOSIS — E11621 Type 2 diabetes mellitus with foot ulcer: Secondary | ICD-10-CM | POA: Diagnosis not present

## 2017-01-07 DIAGNOSIS — I2510 Atherosclerotic heart disease of native coronary artery without angina pectoris: Secondary | ICD-10-CM | POA: Diagnosis not present

## 2017-01-07 DIAGNOSIS — L97511 Non-pressure chronic ulcer of other part of right foot limited to breakdown of skin: Secondary | ICD-10-CM | POA: Diagnosis not present

## 2017-01-07 DIAGNOSIS — M069 Rheumatoid arthritis, unspecified: Secondary | ICD-10-CM | POA: Diagnosis not present

## 2017-01-10 DIAGNOSIS — I2510 Atherosclerotic heart disease of native coronary artery without angina pectoris: Secondary | ICD-10-CM | POA: Diagnosis not present

## 2017-01-10 DIAGNOSIS — I739 Peripheral vascular disease, unspecified: Secondary | ICD-10-CM | POA: Diagnosis not present

## 2017-01-10 DIAGNOSIS — E1151 Type 2 diabetes mellitus with diabetic peripheral angiopathy without gangrene: Secondary | ICD-10-CM | POA: Diagnosis not present

## 2017-01-10 DIAGNOSIS — M069 Rheumatoid arthritis, unspecified: Secondary | ICD-10-CM | POA: Diagnosis not present

## 2017-01-10 DIAGNOSIS — L97511 Non-pressure chronic ulcer of other part of right foot limited to breakdown of skin: Secondary | ICD-10-CM | POA: Diagnosis not present

## 2017-01-10 DIAGNOSIS — E11621 Type 2 diabetes mellitus with foot ulcer: Secondary | ICD-10-CM | POA: Diagnosis not present

## 2017-01-12 DIAGNOSIS — I2510 Atherosclerotic heart disease of native coronary artery without angina pectoris: Secondary | ICD-10-CM | POA: Diagnosis not present

## 2017-01-12 DIAGNOSIS — L97511 Non-pressure chronic ulcer of other part of right foot limited to breakdown of skin: Secondary | ICD-10-CM | POA: Diagnosis not present

## 2017-01-12 DIAGNOSIS — E11621 Type 2 diabetes mellitus with foot ulcer: Secondary | ICD-10-CM | POA: Diagnosis not present

## 2017-01-12 DIAGNOSIS — E1151 Type 2 diabetes mellitus with diabetic peripheral angiopathy without gangrene: Secondary | ICD-10-CM | POA: Diagnosis not present

## 2017-01-12 DIAGNOSIS — M069 Rheumatoid arthritis, unspecified: Secondary | ICD-10-CM | POA: Diagnosis not present

## 2017-01-12 DIAGNOSIS — I739 Peripheral vascular disease, unspecified: Secondary | ICD-10-CM | POA: Diagnosis not present

## 2017-01-14 DIAGNOSIS — E1151 Type 2 diabetes mellitus with diabetic peripheral angiopathy without gangrene: Secondary | ICD-10-CM | POA: Diagnosis not present

## 2017-01-14 DIAGNOSIS — M069 Rheumatoid arthritis, unspecified: Secondary | ICD-10-CM | POA: Diagnosis not present

## 2017-01-14 DIAGNOSIS — L97511 Non-pressure chronic ulcer of other part of right foot limited to breakdown of skin: Secondary | ICD-10-CM | POA: Diagnosis not present

## 2017-01-14 DIAGNOSIS — E11621 Type 2 diabetes mellitus with foot ulcer: Secondary | ICD-10-CM | POA: Diagnosis not present

## 2017-01-14 DIAGNOSIS — I739 Peripheral vascular disease, unspecified: Secondary | ICD-10-CM | POA: Diagnosis not present

## 2017-01-14 DIAGNOSIS — I2510 Atherosclerotic heart disease of native coronary artery without angina pectoris: Secondary | ICD-10-CM | POA: Diagnosis not present

## 2017-01-16 DIAGNOSIS — N3090 Cystitis, unspecified without hematuria: Secondary | ICD-10-CM | POA: Diagnosis not present

## 2017-01-16 DIAGNOSIS — R300 Dysuria: Secondary | ICD-10-CM | POA: Diagnosis not present

## 2017-01-19 DIAGNOSIS — J90 Pleural effusion, not elsewhere classified: Secondary | ICD-10-CM | POA: Diagnosis not present

## 2017-01-19 DIAGNOSIS — M75101 Unspecified rotator cuff tear or rupture of right shoulder, not specified as traumatic: Secondary | ICD-10-CM | POA: Diagnosis not present

## 2017-01-19 DIAGNOSIS — M545 Low back pain: Secondary | ICD-10-CM | POA: Diagnosis not present

## 2017-01-19 DIAGNOSIS — M0589 Other rheumatoid arthritis with rheumatoid factor of multiple sites: Secondary | ICD-10-CM | POA: Diagnosis not present

## 2017-01-19 DIAGNOSIS — E1142 Type 2 diabetes mellitus with diabetic polyneuropathy: Secondary | ICD-10-CM | POA: Diagnosis not present

## 2017-01-19 DIAGNOSIS — I129 Hypertensive chronic kidney disease with stage 1 through stage 4 chronic kidney disease, or unspecified chronic kidney disease: Secondary | ICD-10-CM | POA: Diagnosis not present

## 2017-01-19 DIAGNOSIS — Z79899 Other long term (current) drug therapy: Secondary | ICD-10-CM | POA: Diagnosis not present

## 2017-01-19 DIAGNOSIS — Z7952 Long term (current) use of systemic steroids: Secondary | ICD-10-CM | POA: Diagnosis not present

## 2017-01-19 DIAGNOSIS — E559 Vitamin D deficiency, unspecified: Secondary | ICD-10-CM | POA: Diagnosis not present

## 2017-01-19 DIAGNOSIS — R809 Proteinuria, unspecified: Secondary | ICD-10-CM | POA: Diagnosis not present

## 2017-01-19 DIAGNOSIS — E039 Hypothyroidism, unspecified: Secondary | ICD-10-CM | POA: Diagnosis not present

## 2017-01-19 DIAGNOSIS — Z8719 Personal history of other diseases of the digestive system: Secondary | ICD-10-CM | POA: Diagnosis not present

## 2017-01-19 DIAGNOSIS — M3500 Sicca syndrome, unspecified: Secondary | ICD-10-CM | POA: Diagnosis not present

## 2017-01-19 DIAGNOSIS — M0579 Rheumatoid arthritis with rheumatoid factor of multiple sites without organ or systems involvement: Secondary | ICD-10-CM | POA: Diagnosis not present

## 2017-01-19 DIAGNOSIS — E1122 Type 2 diabetes mellitus with diabetic chronic kidney disease: Secondary | ICD-10-CM | POA: Diagnosis not present

## 2017-01-19 DIAGNOSIS — E1129 Type 2 diabetes mellitus with other diabetic kidney complication: Secondary | ICD-10-CM | POA: Diagnosis not present

## 2017-01-19 DIAGNOSIS — E785 Hyperlipidemia, unspecified: Secondary | ICD-10-CM | POA: Diagnosis not present

## 2017-01-19 DIAGNOSIS — Z794 Long term (current) use of insulin: Secondary | ICD-10-CM | POA: Diagnosis not present

## 2017-01-19 DIAGNOSIS — N183 Chronic kidney disease, stage 3 (moderate): Secondary | ICD-10-CM | POA: Diagnosis not present

## 2017-01-19 DIAGNOSIS — M1990 Unspecified osteoarthritis, unspecified site: Secondary | ICD-10-CM | POA: Diagnosis not present

## 2017-01-19 DIAGNOSIS — R918 Other nonspecific abnormal finding of lung field: Secondary | ICD-10-CM | POA: Diagnosis not present

## 2017-01-19 DIAGNOSIS — M542 Cervicalgia: Secondary | ICD-10-CM | POA: Diagnosis not present

## 2017-01-20 DIAGNOSIS — M069 Rheumatoid arthritis, unspecified: Secondary | ICD-10-CM | POA: Diagnosis not present

## 2017-01-20 DIAGNOSIS — I2510 Atherosclerotic heart disease of native coronary artery without angina pectoris: Secondary | ICD-10-CM | POA: Diagnosis not present

## 2017-01-20 DIAGNOSIS — I739 Peripheral vascular disease, unspecified: Secondary | ICD-10-CM | POA: Diagnosis not present

## 2017-01-20 DIAGNOSIS — E1151 Type 2 diabetes mellitus with diabetic peripheral angiopathy without gangrene: Secondary | ICD-10-CM | POA: Diagnosis not present

## 2017-01-20 DIAGNOSIS — L97511 Non-pressure chronic ulcer of other part of right foot limited to breakdown of skin: Secondary | ICD-10-CM | POA: Diagnosis not present

## 2017-01-20 DIAGNOSIS — E11621 Type 2 diabetes mellitus with foot ulcer: Secondary | ICD-10-CM | POA: Diagnosis not present

## 2017-01-21 ENCOUNTER — Encounter (INDEPENDENT_AMBULATORY_CARE_PROVIDER_SITE_OTHER): Payer: Self-pay

## 2017-01-21 ENCOUNTER — Ambulatory Visit (INDEPENDENT_AMBULATORY_CARE_PROVIDER_SITE_OTHER): Payer: Medicare Other | Admitting: Sports Medicine

## 2017-01-21 DIAGNOSIS — L97511 Non-pressure chronic ulcer of other part of right foot limited to breakdown of skin: Secondary | ICD-10-CM

## 2017-01-21 DIAGNOSIS — E1142 Type 2 diabetes mellitus with diabetic polyneuropathy: Secondary | ICD-10-CM

## 2017-01-21 DIAGNOSIS — L03031 Cellulitis of right toe: Secondary | ICD-10-CM

## 2017-01-21 DIAGNOSIS — M21969 Unspecified acquired deformity of unspecified lower leg: Secondary | ICD-10-CM

## 2017-01-21 DIAGNOSIS — L02611 Cutaneous abscess of right foot: Secondary | ICD-10-CM

## 2017-01-21 DIAGNOSIS — I739 Peripheral vascular disease, unspecified: Secondary | ICD-10-CM

## 2017-01-22 NOTE — Progress Notes (Signed)
Subjective: [name] is a 76 y.o. female patient seen in office for follow up evaluation of ulceration of the right 1st toe. Patient has a history of diabetes and a blood glucose level today of 148mg /dl.   Patient is changing the dressing using collagen dressing at home with help of daughter and sometime home nursing; Patient reports that sometimes home nursing isn't coming. Admits that it is slowly getting better. Denies nausea/fever/vomiting/chills/night sweats/shortness of breath/chest pain. Patient has no other pedal complaints at this time.  Patient Active Problem List   Diagnosis Date Noted  . Coronary artery disease 12/08/2010  . Dyspnea 12/08/2010  . HYPERTENSION, BENIGN 04/01/2010  . RHEUMATOID ARTHRITIS 04/01/2010  . ABNORMAL CV (STRESS) TEST 04/01/2010  . LEG PAIN, RIGHT 03/12/2010   Current Outpatient Prescriptions on File Prior to Visit  Medication Sig Dispense Refill  . B-D ULTRAFINE III SHORT PEN 31G X 8 MM MISC     . cloNIDine (CATAPRES) 0.1 MG tablet Take 1 tablet by mouth Three times a day.    . diazepam (VALIUM) 5 MG tablet Take 5 mg by mouth 3 (three) times daily.      13/01/2010 doxycycline (VIBRA-TABS) 100 MG tablet Take 1 tablet (100 mg total) by mouth 2 (two) times daily. 20 tablet 0  . ezetimibe-simvastatin (VYTORIN) 10-20 MG per tablet Take 1 tablet by mouth at bedtime.      Marland Kitchen FREESTYLE LITE test strip     . furosemide (LASIX) 40 MG tablet Take 40 mg by mouth daily.      . insulin NPH-insulin regular (NOVOLIN 70/30) (70-30) 100 UNIT/ML injection Inject 48 Units into the skin daily with breakfast. 10 units in p.m.     06-21-1971 L-Methylfolate-B6-B12 (METANX) 2.8-25-2 MG TABS Take 1 tablet by mouth daily.      . lansoprazole (PREVACID) 30 MG capsule Take 30 mg by mouth daily.      11-03-1981 levothyroxine (SYNTHROID, LEVOTHROID) 75 MCG tablet Take 75 mcg by mouth daily.      . metFORMIN (GLUCOPHAGE) 1000 MG tablet Take 500 mg by mouth 3 (three) times daily. Take 1/2 tab twice a day     . mupirocin ointment (BACTROBAN) 2 % To right toe ulcer 30 g 0  . oxyCODONE (OXY IR/ROXICODONE) 5 MG immediate release tablet Take 5 mg by mouth 2 (two) times daily.      Marland Kitchen oxyCODONE (OXYCONTIN) 40 MG 12 hr tablet Take 40 mg by mouth every 8 (eight) hours.      . potassium chloride (KLOR-CON 10) 10 MEQ CR tablet Take 10 mEq by mouth 2 (two) times daily. When taking furosemide     . predniSONE (DELTASONE) 5 MG tablet Take 5 mg by mouth 2 (two) times daily.      Marland Kitchen triamcinolone (KENALOG) 0.1 % cream     . valsartan (DIOVAN) 320 MG tablet Take 1 tablet (320 mg total) by mouth daily. 30 tablet 1   No current facility-administered medications on file prior to visit.    Allergies  Allergen Reactions  . Gold Anaphylaxis    Swelling of lips/tongue, throat Swelling of lips/tongue, throat  . Nsaids Other (See Comments)    Intolerance Intolerance  . Sulfa Antibiotics Nausea Only  . Sulfonamide Derivatives   . Latex Other (See Comments)    blisters  . Sulfamethoxazole Nausea Only    Recent Results (from the past 2160 hour(s))  WOUND CULTURE     Status: Abnormal   Collection Time: 12/17/16 12:00 AM  Result  Value Ref Range   Gram Stain Result CANCELED     Comment: The specimen submitted does not meet the laboratory's criteria for acceptability. Refer to [facility] for specimen acceptability criteria.  Result canceled by the ancillary    Aerobic Bacterial Culture Final report (A)    Organism ID, Bacteria Klebsiella oxytoca (A)     Comment: Heavy growth   Organism ID, Bacteria Routine flora     Comment: Heavy growth   Antimicrobial Susceptibility Comment     Comment:       ** S = Susceptible; I = Intermediate; R = Resistant **                    P = Positive; N = Negative             MICS are expressed in micrograms per mL    Antibiotic                 RSLT#1    RSLT#2    RSLT#3    RSLT#4 Amoxicillin/Clavulanic Acid    R Ampicillin                      R Cefazolin                      R Cefepime                       S Ceftriaxone                    R Cefuroxime                     R Ciprofloxacin                  S Ertapenem                      S Gentamicin                     S Imipenem                       S Levofloxacin                   S Meropenem                      S Tetracycline                   S Tobramycin                     S Trimethoprim/Sulfa             S     Objective: There were no vitals filed for this visit.  General: Patient is awake, alert, oriented x 3 and in no acute distress.  Dermatology: Skin is warm and dry bilateral with a partial thickness ulceration present right plantar hallux. Ulceration measures 1 cm x 2 cm x 0.3 cm. There is a keratotic border with a fibrogranular base and a small skin bridge in between the ulcerations. The ulceration does not  probe to bone. There is no malodor, no active drainage, decreased erythema, decreased edema. No acute signs of infection.   Vascular: Dorsalis Pedis pulse = 1/4 Bilateral,  Posterior Tibial pulse =  0/4 Bilateral,  Capillary Fill Time < 5 seconds. Chronic venous skin changes.  Neurologic: Protective sensation absent to the level of the ankles bilateral using  the 5.07/10g [employer].  Musculosketal: No Pain with palpation to ulcerated area. No pain with compression to calves bilateral. Planus and digital deformity with rigid hallux extensus on right.   No results for input(s): GRAMSTAIN, LABORGA in the last 8760 hours.  Assessment and Plan:  Problem List Items Addressed This Visit    None    Visit Diagnoses    Toe ulcer, right, limited to breakdown of skin (HCC)    -  Primary   Cellulitis and abscess of toe of right foot       improved   Diabetic polyneuropathy associated with type 2 diabetes mellitus (HCC)       PVD (peripheral vascular disease) (HCC)       Deformity of foot, unspecified laterality         -Examined  patient and discussed the progression of the wound and treatment alternatives. - Excisionally dedbrided ulceration at right plantar hallux to healthy bleeding borders removing nonviable tissue using a sterile chisel blade. Wound measures post debridement as above. Wound was debrided to the level of the dermis with viable wound base exposed to promote healing. Hemostasis was achieved with manuel pressure. Patient tolerated procedure well without any discomfort or anesthesia necessary for this wound debridement.  -Applied  PRISMA and dry sterile dressing and instructed patient to continue with daily dressings at home consisting of the same with assistance from [facility] - Advised patient to go to the ER or return to office if the wound worsens or if constitutional symptoms are present. -Patient to return to office in 2-3 weeks for follow up care and evaluation or sooner if problems arise.  Asencion Islam, DPM

## 2017-01-24 DIAGNOSIS — I2510 Atherosclerotic heart disease of native coronary artery without angina pectoris: Secondary | ICD-10-CM | POA: Diagnosis not present

## 2017-01-24 DIAGNOSIS — M069 Rheumatoid arthritis, unspecified: Secondary | ICD-10-CM | POA: Diagnosis not present

## 2017-01-24 DIAGNOSIS — I739 Peripheral vascular disease, unspecified: Secondary | ICD-10-CM | POA: Diagnosis not present

## 2017-01-24 DIAGNOSIS — E1151 Type 2 diabetes mellitus with diabetic peripheral angiopathy without gangrene: Secondary | ICD-10-CM | POA: Diagnosis not present

## 2017-01-24 DIAGNOSIS — E11621 Type 2 diabetes mellitus with foot ulcer: Secondary | ICD-10-CM | POA: Diagnosis not present

## 2017-01-24 DIAGNOSIS — L97511 Non-pressure chronic ulcer of other part of right foot limited to breakdown of skin: Secondary | ICD-10-CM | POA: Diagnosis not present

## 2017-01-26 DIAGNOSIS — N3001 Acute cystitis with hematuria: Secondary | ICD-10-CM | POA: Diagnosis not present

## 2017-01-26 DIAGNOSIS — I739 Peripheral vascular disease, unspecified: Secondary | ICD-10-CM | POA: Diagnosis not present

## 2017-01-26 DIAGNOSIS — M069 Rheumatoid arthritis, unspecified: Secondary | ICD-10-CM | POA: Diagnosis not present

## 2017-01-26 DIAGNOSIS — E11621 Type 2 diabetes mellitus with foot ulcer: Secondary | ICD-10-CM | POA: Diagnosis not present

## 2017-01-26 DIAGNOSIS — L97511 Non-pressure chronic ulcer of other part of right foot limited to breakdown of skin: Secondary | ICD-10-CM | POA: Diagnosis not present

## 2017-01-26 DIAGNOSIS — I2510 Atherosclerotic heart disease of native coronary artery without angina pectoris: Secondary | ICD-10-CM | POA: Diagnosis not present

## 2017-01-26 DIAGNOSIS — E1151 Type 2 diabetes mellitus with diabetic peripheral angiopathy without gangrene: Secondary | ICD-10-CM | POA: Diagnosis not present

## 2017-01-26 DIAGNOSIS — N3091 Cystitis, unspecified with hematuria: Secondary | ICD-10-CM | POA: Diagnosis not present

## 2017-01-27 DIAGNOSIS — S63217A Subluxation of metacarpophalangeal joint of left little finger, initial encounter: Secondary | ICD-10-CM | POA: Diagnosis not present

## 2017-01-27 DIAGNOSIS — S63216A Subluxation of metacarpophalangeal joint of right little finger, initial encounter: Secondary | ICD-10-CM | POA: Diagnosis not present

## 2017-01-27 DIAGNOSIS — M5137 Other intervertebral disc degeneration, lumbosacral region: Secondary | ICD-10-CM | POA: Diagnosis not present

## 2017-01-28 DIAGNOSIS — M069 Rheumatoid arthritis, unspecified: Secondary | ICD-10-CM | POA: Diagnosis not present

## 2017-01-28 DIAGNOSIS — I2510 Atherosclerotic heart disease of native coronary artery without angina pectoris: Secondary | ICD-10-CM | POA: Diagnosis not present

## 2017-01-28 DIAGNOSIS — E1151 Type 2 diabetes mellitus with diabetic peripheral angiopathy without gangrene: Secondary | ICD-10-CM | POA: Diagnosis not present

## 2017-01-28 DIAGNOSIS — I739 Peripheral vascular disease, unspecified: Secondary | ICD-10-CM | POA: Diagnosis not present

## 2017-01-28 DIAGNOSIS — L97511 Non-pressure chronic ulcer of other part of right foot limited to breakdown of skin: Secondary | ICD-10-CM | POA: Diagnosis not present

## 2017-01-28 DIAGNOSIS — E11621 Type 2 diabetes mellitus with foot ulcer: Secondary | ICD-10-CM | POA: Diagnosis not present

## 2017-02-01 DIAGNOSIS — E11621 Type 2 diabetes mellitus with foot ulcer: Secondary | ICD-10-CM | POA: Diagnosis not present

## 2017-02-01 DIAGNOSIS — M069 Rheumatoid arthritis, unspecified: Secondary | ICD-10-CM | POA: Diagnosis not present

## 2017-02-01 DIAGNOSIS — E1151 Type 2 diabetes mellitus with diabetic peripheral angiopathy without gangrene: Secondary | ICD-10-CM | POA: Diagnosis not present

## 2017-02-01 DIAGNOSIS — I2510 Atherosclerotic heart disease of native coronary artery without angina pectoris: Secondary | ICD-10-CM | POA: Diagnosis not present

## 2017-02-01 DIAGNOSIS — I739 Peripheral vascular disease, unspecified: Secondary | ICD-10-CM | POA: Diagnosis not present

## 2017-02-01 DIAGNOSIS — L97511 Non-pressure chronic ulcer of other part of right foot limited to breakdown of skin: Secondary | ICD-10-CM | POA: Diagnosis not present

## 2017-02-03 DIAGNOSIS — E11621 Type 2 diabetes mellitus with foot ulcer: Secondary | ICD-10-CM | POA: Diagnosis not present

## 2017-02-03 DIAGNOSIS — L97511 Non-pressure chronic ulcer of other part of right foot limited to breakdown of skin: Secondary | ICD-10-CM | POA: Diagnosis not present

## 2017-02-03 DIAGNOSIS — I2510 Atherosclerotic heart disease of native coronary artery without angina pectoris: Secondary | ICD-10-CM | POA: Diagnosis not present

## 2017-02-03 DIAGNOSIS — E1151 Type 2 diabetes mellitus with diabetic peripheral angiopathy without gangrene: Secondary | ICD-10-CM | POA: Diagnosis not present

## 2017-02-03 DIAGNOSIS — M069 Rheumatoid arthritis, unspecified: Secondary | ICD-10-CM | POA: Diagnosis not present

## 2017-02-03 DIAGNOSIS — I739 Peripheral vascular disease, unspecified: Secondary | ICD-10-CM | POA: Diagnosis not present

## 2017-02-07 DIAGNOSIS — L97511 Non-pressure chronic ulcer of other part of right foot limited to breakdown of skin: Secondary | ICD-10-CM | POA: Diagnosis not present

## 2017-02-07 DIAGNOSIS — M069 Rheumatoid arthritis, unspecified: Secondary | ICD-10-CM | POA: Diagnosis not present

## 2017-02-07 DIAGNOSIS — I739 Peripheral vascular disease, unspecified: Secondary | ICD-10-CM | POA: Diagnosis not present

## 2017-02-07 DIAGNOSIS — I2510 Atherosclerotic heart disease of native coronary artery without angina pectoris: Secondary | ICD-10-CM | POA: Diagnosis not present

## 2017-02-07 DIAGNOSIS — E11621 Type 2 diabetes mellitus with foot ulcer: Secondary | ICD-10-CM | POA: Diagnosis not present

## 2017-02-07 DIAGNOSIS — E1151 Type 2 diabetes mellitus with diabetic peripheral angiopathy without gangrene: Secondary | ICD-10-CM | POA: Diagnosis not present

## 2017-02-10 DIAGNOSIS — E1151 Type 2 diabetes mellitus with diabetic peripheral angiopathy without gangrene: Secondary | ICD-10-CM | POA: Diagnosis not present

## 2017-02-10 DIAGNOSIS — E11621 Type 2 diabetes mellitus with foot ulcer: Secondary | ICD-10-CM | POA: Diagnosis not present

## 2017-02-10 DIAGNOSIS — I739 Peripheral vascular disease, unspecified: Secondary | ICD-10-CM | POA: Diagnosis not present

## 2017-02-10 DIAGNOSIS — L97511 Non-pressure chronic ulcer of other part of right foot limited to breakdown of skin: Secondary | ICD-10-CM | POA: Diagnosis not present

## 2017-02-10 DIAGNOSIS — M069 Rheumatoid arthritis, unspecified: Secondary | ICD-10-CM | POA: Diagnosis not present

## 2017-02-10 DIAGNOSIS — I2510 Atherosclerotic heart disease of native coronary artery without angina pectoris: Secondary | ICD-10-CM | POA: Diagnosis not present

## 2017-02-15 DIAGNOSIS — L97511 Non-pressure chronic ulcer of other part of right foot limited to breakdown of skin: Secondary | ICD-10-CM | POA: Diagnosis not present

## 2017-02-15 DIAGNOSIS — E1151 Type 2 diabetes mellitus with diabetic peripheral angiopathy without gangrene: Secondary | ICD-10-CM | POA: Diagnosis not present

## 2017-02-15 DIAGNOSIS — I739 Peripheral vascular disease, unspecified: Secondary | ICD-10-CM | POA: Diagnosis not present

## 2017-02-15 DIAGNOSIS — E11621 Type 2 diabetes mellitus with foot ulcer: Secondary | ICD-10-CM | POA: Diagnosis not present

## 2017-02-15 DIAGNOSIS — M069 Rheumatoid arthritis, unspecified: Secondary | ICD-10-CM | POA: Diagnosis not present

## 2017-02-15 DIAGNOSIS — I2510 Atherosclerotic heart disease of native coronary artery without angina pectoris: Secondary | ICD-10-CM | POA: Diagnosis not present

## 2017-02-17 DIAGNOSIS — M069 Rheumatoid arthritis, unspecified: Secondary | ICD-10-CM | POA: Diagnosis not present

## 2017-02-17 DIAGNOSIS — I2510 Atherosclerotic heart disease of native coronary artery without angina pectoris: Secondary | ICD-10-CM | POA: Diagnosis not present

## 2017-02-17 DIAGNOSIS — I739 Peripheral vascular disease, unspecified: Secondary | ICD-10-CM | POA: Diagnosis not present

## 2017-02-17 DIAGNOSIS — E11621 Type 2 diabetes mellitus with foot ulcer: Secondary | ICD-10-CM | POA: Diagnosis not present

## 2017-02-17 DIAGNOSIS — E1151 Type 2 diabetes mellitus with diabetic peripheral angiopathy without gangrene: Secondary | ICD-10-CM | POA: Diagnosis not present

## 2017-02-17 DIAGNOSIS — L97511 Non-pressure chronic ulcer of other part of right foot limited to breakdown of skin: Secondary | ICD-10-CM | POA: Diagnosis not present

## 2017-02-18 ENCOUNTER — Ambulatory Visit (INDEPENDENT_AMBULATORY_CARE_PROVIDER_SITE_OTHER): Payer: Medicare Other | Admitting: Sports Medicine

## 2017-02-18 ENCOUNTER — Encounter: Payer: Self-pay | Admitting: Sports Medicine

## 2017-02-18 DIAGNOSIS — E1142 Type 2 diabetes mellitus with diabetic polyneuropathy: Secondary | ICD-10-CM

## 2017-02-18 DIAGNOSIS — L97511 Non-pressure chronic ulcer of other part of right foot limited to breakdown of skin: Secondary | ICD-10-CM | POA: Diagnosis not present

## 2017-02-18 DIAGNOSIS — M21969 Unspecified acquired deformity of unspecified lower leg: Secondary | ICD-10-CM

## 2017-02-18 DIAGNOSIS — L02611 Cutaneous abscess of right foot: Secondary | ICD-10-CM

## 2017-02-18 DIAGNOSIS — I739 Peripheral vascular disease, unspecified: Secondary | ICD-10-CM

## 2017-02-18 DIAGNOSIS — L03031 Cellulitis of right toe: Secondary | ICD-10-CM

## 2017-02-18 DIAGNOSIS — Z23 Encounter for immunization: Secondary | ICD-10-CM | POA: Diagnosis not present

## 2017-02-18 NOTE — Progress Notes (Signed)
Subjective: Dariyah Garduno is a 76 y.o. female patient seen in office for follow up evaluation of ulceration of the right 1st toe. Patient has a history of diabetes and a blood glucose level today of 140mg /dl.   Patient is changing the dressing using collagen dressing at home with help of daughter and sometime home nursing 2x per week; Patient admits that it is slowly getting better but is concerned why is taking longer. Denies nausea/fever/vomiting/chills/night sweats/shortness of breath/chest pain. Patient has no other pedal complaints at this time.  Patient Active Problem List   Diagnosis Date Noted  . Coronary artery disease 12/08/2010  . Dyspnea 12/08/2010  . HYPERTENSION, BENIGN 04/01/2010  . RHEUMATOID ARTHRITIS 04/01/2010  . ABNORMAL CV (STRESS) TEST 04/01/2010  . LEG PAIN, RIGHT 03/12/2010   Current Outpatient Prescriptions on File Prior to Visit  Medication Sig Dispense Refill  . B-D ULTRAFINE III SHORT PEN 31G X 8 MM MISC     . cloNIDine (CATAPRES) 0.1 MG tablet Take 1 tablet by mouth Three times a day.    . diazepam (VALIUM) 5 MG tablet Take 5 mg by mouth 3 (three) times daily.      13/01/2010 doxycycline (VIBRA-TABS) 100 MG tablet Take 1 tablet (100 mg total) by mouth 2 (two) times daily. 20 tablet 0  . ezetimibe-simvastatin (VYTORIN) 10-20 MG per tablet Take 1 tablet by mouth at bedtime.      Marland Kitchen FREESTYLE LITE test strip     . furosemide (LASIX) 40 MG tablet Take 40 mg by mouth daily.      . insulin NPH-insulin regular (NOVOLIN 70/30) (70-30) 100 UNIT/ML injection Inject 48 Units into the skin daily with breakfast. 10 units in p.m.     06-21-1971 L-Methylfolate-B6-B12 (METANX) 2.8-25-2 MG TABS Take 1 tablet by mouth daily.      . lansoprazole (PREVACID) 30 MG capsule Take 30 mg by mouth daily.      11-03-1981 levothyroxine (SYNTHROID, LEVOTHROID) 75 MCG tablet Take 75 mcg by mouth daily.      . metFORMIN (GLUCOPHAGE) 1000 MG tablet Take 500 mg by mouth 3 (three) times daily. Take 1/2 tab twice a day     . mupirocin ointment (BACTROBAN) 2 % To right toe ulcer 30 g 0  . oxyCODONE (OXY IR/ROXICODONE) 5 MG immediate release tablet Take 5 mg by mouth 2 (two) times daily.      Marland Kitchen oxyCODONE (OXYCONTIN) 40 MG 12 hr tablet Take 40 mg by mouth every 8 (eight) hours.      . potassium chloride (KLOR-CON 10) 10 MEQ CR tablet Take 10 mEq by mouth 2 (two) times daily. When taking furosemide     . predniSONE (DELTASONE) 5 MG tablet Take 5 mg by mouth 2 (two) times daily.      Marland Kitchen triamcinolone (KENALOG) 0.1 % cream     . valsartan (DIOVAN) 320 MG tablet Take 1 tablet (320 mg total) by mouth daily. 30 tablet 1   No current facility-administered medications on file prior to visit.    Allergies  Allergen Reactions  . Gold Anaphylaxis    Swelling of lips/tongue, throat Swelling of lips/tongue, throat  . Nsaids Other (See Comments)    Intolerance Intolerance  . Sulfa Antibiotics Nausea Only  . Sulfonamide Derivatives   . Latex Other (See Comments)    blisters  . Sulfamethoxazole Nausea Only    Recent Results (from the past 2160 hour(s))  WOUND CULTURE     Status: Abnormal   Collection Time: 12/17/16 12:00  AM  Result Value Ref Range   Gram Stain Result CANCELED     Comment: The specimen submitted does not meet the laboratory's criteria for acceptability. Refer to [facility] for specimen acceptability criteria.  Result canceled by the ancillary    Aerobic Bacterial Culture Final report (A)    Organism ID, Bacteria Klebsiella oxytoca (A)     Comment: Heavy growth   Organism ID, Bacteria Routine flora     Comment: Heavy growth   Antimicrobial Susceptibility Comment     Comment:       ** S = Susceptible; I = Intermediate; R = Resistant **                    P = Positive; N = Negative             MICS are expressed in micrograms per mL    Antibiotic                 RSLT#1    RSLT#2    RSLT#3    RSLT#4 Amoxicillin/Clavulanic Acid    R Ampicillin                      R Cefazolin                      R Cefepime                       S Ceftriaxone                    R Cefuroxime                     R Ciprofloxacin                  S Ertapenem                      S Gentamicin                     S Imipenem                       S Levofloxacin                   S Meropenem                      S Tetracycline                   S Tobramycin                     S Trimethoprim/Sulfa             S     Objective: There were no vitals filed for this visit.  General: Patient is awake, alert, oriented x 3 and in no acute distress.  Dermatology: Skin is warm and dry bilateral with a partial thickness ulceration present right plantar hallux proximal that measures 0.9 cm x 0.5 cm x 0.2 cm and plantar distal that measures 1cm x 0.5 cm x 0.3cm. There is a keratotic border with a fibrogranular base and a small skin bridge in between the ulcerations. The ulcerations do not  probe to bone. There is no malodor, no active drainage, decreased erythema, decreased edema. No other acute signs  of infection.   Vascular: Dorsalis Pedis pulse = 1/4 Bilateral,  Posterior Tibial pulse = 0/4 Bilateral,  Capillary Fill Time < 5 seconds. Chronic venous skin changes.  Neurologic: Protective sensation absent to the level of the ankles bilateral using  the 5.07/10g [employer].  Musculosketal: No Pain with palpation to ulcerated area. No pain with compression to calves bilateral. Planus and digital deformity with rigid hallux extensus on right.   No results for input(s): GRAMSTAIN, LABORGA in the last 8760 hours.  Assessment and Plan:  Problem List Items Addressed This Visit    None    Visit Diagnoses    Toe ulcer, right, limited to breakdown of skin (HCC)    -  Primary   Cellulitis and abscess of toe of right foot       Diabetic polyneuropathy associated with type 2 diabetes mellitus (HCC)       PVD (peripheral vascular disease) (HCC)       Deformity of  foot, unspecified laterality         -Examined patient and discussed the progression of the wound and treatment alternatives. - Excisionally dedbrided ulceration at right plantar hallux to healthy bleeding borders removing nonviable tissue using a sterile chisel blade. Wound measures post debridement as above. Wound was debrided to the level of the dermis with viable wound base exposed to promote healing. Hemostasis was achieved with manuel pressure. Patient tolerated procedure well without any discomfort or anesthesia necessary for this wound debridement.  -Applied  PRISMA and dry sterile dressing and instructed patient to continue with daily dressings at home consisting of the same with assistance from [facility] -Will consider Epifix graft for next visit  -Added felt offloading padding to post op shoe  - Advised patient to go to the ER or return to office if the wound worsens or if constitutional symptoms are present. -Patient to return to office in 3 weeks for follow up care and evaluation or sooner if problems arise.  Asencion Islam, DPM

## 2017-02-18 NOTE — Progress Notes (Signed)
   Subjective:    Patient ID: [name], female    DOB: 07/09/1940, 76 y.o.   MRN: [id number]  HPI    Review of Systems  All other systems reviewed and are negative.      Objective:   Physical Exam        Assessment & Plan:

## 2017-02-21 ENCOUNTER — Telehealth: Payer: Self-pay | Admitting: *Deleted

## 2017-02-21 DIAGNOSIS — L97511 Non-pressure chronic ulcer of other part of right foot limited to breakdown of skin: Secondary | ICD-10-CM | POA: Diagnosis not present

## 2017-02-21 DIAGNOSIS — I2510 Atherosclerotic heart disease of native coronary artery without angina pectoris: Secondary | ICD-10-CM | POA: Diagnosis not present

## 2017-02-21 DIAGNOSIS — E11621 Type 2 diabetes mellitus with foot ulcer: Secondary | ICD-10-CM | POA: Diagnosis not present

## 2017-02-21 DIAGNOSIS — I739 Peripheral vascular disease, unspecified: Secondary | ICD-10-CM | POA: Diagnosis not present

## 2017-02-21 DIAGNOSIS — E1151 Type 2 diabetes mellitus with diabetic peripheral angiopathy without gangrene: Secondary | ICD-10-CM | POA: Diagnosis not present

## 2017-02-21 DIAGNOSIS — M069 Rheumatoid arthritis, unspecified: Secondary | ICD-10-CM | POA: Diagnosis not present

## 2017-02-21 NOTE — Telephone Encounter (Addendum)
-----   Message from Asencion Islam, [place] sent at 02/18/2017  4:50 PM EDT ----- Regarding: Epifix  Epifix 60mm disk for next appointment if covered by her insurance Thanks Dr. S.02/21/2017-Faxed required form, clinicals and demographics to [facility].

## 2017-02-24 DIAGNOSIS — I2510 Atherosclerotic heart disease of native coronary artery without angina pectoris: Secondary | ICD-10-CM | POA: Diagnosis not present

## 2017-02-24 DIAGNOSIS — E1151 Type 2 diabetes mellitus with diabetic peripheral angiopathy without gangrene: Secondary | ICD-10-CM | POA: Diagnosis not present

## 2017-02-24 DIAGNOSIS — M069 Rheumatoid arthritis, unspecified: Secondary | ICD-10-CM | POA: Diagnosis not present

## 2017-02-24 DIAGNOSIS — E11621 Type 2 diabetes mellitus with foot ulcer: Secondary | ICD-10-CM | POA: Diagnosis not present

## 2017-02-24 DIAGNOSIS — L97511 Non-pressure chronic ulcer of other part of right foot limited to breakdown of skin: Secondary | ICD-10-CM | POA: Diagnosis not present

## 2017-02-24 DIAGNOSIS — I739 Peripheral vascular disease, unspecified: Secondary | ICD-10-CM | POA: Diagnosis not present

## 2017-02-25 DIAGNOSIS — E119 Type 2 diabetes mellitus without complications: Secondary | ICD-10-CM | POA: Diagnosis not present

## 2017-02-25 DIAGNOSIS — N3000 Acute cystitis without hematuria: Secondary | ICD-10-CM | POA: Diagnosis not present

## 2017-02-25 DIAGNOSIS — R0789 Other chest pain: Secondary | ICD-10-CM | POA: Diagnosis not present

## 2017-02-28 DIAGNOSIS — L97929 Non-pressure chronic ulcer of unspecified part of left lower leg with unspecified severity: Secondary | ICD-10-CM | POA: Diagnosis not present

## 2017-02-28 DIAGNOSIS — E11621 Type 2 diabetes mellitus with foot ulcer: Secondary | ICD-10-CM | POA: Diagnosis not present

## 2017-02-28 DIAGNOSIS — L97519 Non-pressure chronic ulcer of other part of right foot with unspecified severity: Secondary | ICD-10-CM | POA: Diagnosis not present

## 2017-02-28 DIAGNOSIS — I2510 Atherosclerotic heart disease of native coronary artery without angina pectoris: Secondary | ICD-10-CM | POA: Diagnosis not present

## 2017-02-28 DIAGNOSIS — I872 Venous insufficiency (chronic) (peripheral): Secondary | ICD-10-CM | POA: Diagnosis not present

## 2017-02-28 DIAGNOSIS — L97511 Non-pressure chronic ulcer of other part of right foot limited to breakdown of skin: Secondary | ICD-10-CM | POA: Diagnosis not present

## 2017-02-28 DIAGNOSIS — E1151 Type 2 diabetes mellitus with diabetic peripheral angiopathy without gangrene: Secondary | ICD-10-CM | POA: Diagnosis not present

## 2017-02-28 DIAGNOSIS — M069 Rheumatoid arthritis, unspecified: Secondary | ICD-10-CM | POA: Diagnosis not present

## 2017-02-28 DIAGNOSIS — R600 Localized edema: Secondary | ICD-10-CM | POA: Diagnosis not present

## 2017-02-28 DIAGNOSIS — I739 Peripheral vascular disease, unspecified: Secondary | ICD-10-CM | POA: Diagnosis not present

## 2017-03-01 NOTE — Telephone Encounter (Signed)
Return fax from [facility] states wound measurements are not greater than 1 sq cm.

## 2017-03-01 NOTE — Telephone Encounter (Signed)
She has two wounds and combined they are more than 1sq cm and the largest one even measures 1cm x 0.5 cm x 0.3cm. If they will not cover it can you contact Mark from [employer] to see if they will be able to cover it for this patient Thanks Dr. Marylene Land

## 2017-03-02 DIAGNOSIS — I739 Peripheral vascular disease, unspecified: Secondary | ICD-10-CM | POA: Diagnosis not present

## 2017-03-02 DIAGNOSIS — E1151 Type 2 diabetes mellitus with diabetic peripheral angiopathy without gangrene: Secondary | ICD-10-CM | POA: Diagnosis not present

## 2017-03-02 DIAGNOSIS — M069 Rheumatoid arthritis, unspecified: Secondary | ICD-10-CM | POA: Diagnosis not present

## 2017-03-02 DIAGNOSIS — L97511 Non-pressure chronic ulcer of other part of right foot limited to breakdown of skin: Secondary | ICD-10-CM | POA: Diagnosis not present

## 2017-03-02 DIAGNOSIS — I2510 Atherosclerotic heart disease of native coronary artery without angina pectoris: Secondary | ICD-10-CM | POA: Diagnosis not present

## 2017-03-02 DIAGNOSIS — E11621 Type 2 diabetes mellitus with foot ulcer: Secondary | ICD-10-CM | POA: Diagnosis not present

## 2017-03-03 ENCOUNTER — Ambulatory Visit (INDEPENDENT_AMBULATORY_CARE_PROVIDER_SITE_OTHER): Payer: Medicare Other | Admitting: Sports Medicine

## 2017-03-03 DIAGNOSIS — I739 Peripheral vascular disease, unspecified: Secondary | ICD-10-CM

## 2017-03-03 DIAGNOSIS — M21969 Unspecified acquired deformity of unspecified lower leg: Secondary | ICD-10-CM

## 2017-03-03 DIAGNOSIS — L97512 Non-pressure chronic ulcer of other part of right foot with fat layer exposed: Secondary | ICD-10-CM

## 2017-03-03 DIAGNOSIS — E1142 Type 2 diabetes mellitus with diabetic polyneuropathy: Secondary | ICD-10-CM

## 2017-03-03 NOTE — Progress Notes (Signed)
Subjective: [name] is a 76 y.o. female patient seen in office for follow up evaluation of ulceration of the right 1st toe. Patient has a history of diabetes and a blood glucose level today not recorded.  Patient is changing the dressing using collagen dressing at home with help of daughter and sometime home nursing 2x per week; Patient admits that it is slowly getting better but the nurse was concerned about infection and thus they came in earlier, denies nausea/fever/vomiting/chills/night sweats/shortness of breath/chest pain. Patient has no other pedal complaints at this time.  Patient Active Problem List   Diagnosis Date Noted  . Coronary artery disease 12/08/2010  . Dyspnea 12/08/2010  . HYPERTENSION, BENIGN 04/01/2010  . RHEUMATOID ARTHRITIS 04/01/2010  . ABNORMAL CV (STRESS) TEST 04/01/2010  . LEG PAIN, RIGHT 03/12/2010   Current Outpatient Prescriptions on File Prior to Visit  Medication Sig Dispense Refill  . B-D ULTRAFINE III SHORT PEN 31G X 8 MM MISC     . cloNIDine (CATAPRES) 0.1 MG tablet Take 1 tablet by mouth Three times a day.    . diazepam (VALIUM) 5 MG tablet Take 5 mg by mouth 3 (three) times daily.      Marland Kitchen doxycycline (VIBRA-TABS) 100 MG tablet Take 1 tablet (100 mg total) by mouth 2 (two) times daily. 20 tablet 0  . ezetimibe-simvastatin (VYTORIN) 10-20 MG per tablet Take 1 tablet by mouth at bedtime.      Marland Kitchen FREESTYLE LITE test strip     . furosemide (LASIX) 40 MG tablet Take 40 mg by mouth daily.      . insulin NPH-insulin regular (NOVOLIN 70/30) (70-30) 100 UNIT/ML injection Inject 48 Units into the skin daily with breakfast. 10 units in p.m.     Marland Kitchen L-Methylfolate-B6-B12 (METANX) 2.8-25-2 MG TABS Take 1 tablet by mouth daily.      . lansoprazole (PREVACID) 30 MG capsule Take 30 mg by mouth daily.      Marland Kitchen levothyroxine (SYNTHROID, LEVOTHROID) 75 MCG tablet Take 75 mcg by mouth daily.      . metFORMIN (GLUCOPHAGE) 1000 MG tablet Take 500 mg by mouth 3 (three) times  daily. Take 1/2 tab twice a day    . mupirocin ointment (BACTROBAN) 2 % To right toe ulcer 30 g 0  . oxyCODONE (OXY IR/ROXICODONE) 5 MG immediate release tablet Take 5 mg by mouth 2 (two) times daily.      Marland Kitchen oxyCODONE (OXYCONTIN) 40 MG 12 hr tablet Take 40 mg by mouth every 8 (eight) hours.      . potassium chloride (KLOR-CON 10) 10 MEQ CR tablet Take 10 mEq by mouth 2 (two) times daily. When taking furosemide     . predniSONE (DELTASONE) 5 MG tablet Take 5 mg by mouth 2 (two) times daily.      Marland Kitchen triamcinolone (KENALOG) 0.1 % cream     . valsartan (DIOVAN) 320 MG tablet Take 1 tablet (320 mg total) by mouth daily. 30 tablet 1   No current facility-administered medications on file prior to visit.    Allergies  Allergen Reactions  . Gold Anaphylaxis    Swelling of lips/tongue, throat Swelling of lips/tongue, throat  . Nsaids Other (See Comments)    Intolerance Intolerance  . Sulfa Antibiotics Nausea Only  . Sulfonamide Derivatives   . Latex Other (See Comments)    blisters  . Sulfamethoxazole Nausea Only    Recent Results (from the past 2160 hour(s))  WOUND CULTURE     Status: Abnormal  Collection Time: 12/17/16 12:00 AM  Result Value Ref Range   Gram Stain Result CANCELED     Comment: The specimen submitted does not meet the laboratory's criteria for acceptability. Refer to [facility] for specimen acceptability criteria.  Result canceled by the ancillary    Aerobic Bacterial Culture Final report (A)    Organism ID, Bacteria Klebsiella oxytoca (A)     Comment: Heavy growth   Organism ID, Bacteria Routine flora     Comment: Heavy growth   Antimicrobial Susceptibility Comment     Comment:       ** S = Susceptible; I = Intermediate; R = Resistant **                    P = Positive; N = Negative             MICS are expressed in micrograms per mL    Antibiotic                 RSLT#1    RSLT#2    RSLT#3    RSLT#4 Amoxicillin/Clavulanic Acid     R Ampicillin                     R Cefazolin                      R Cefepime                       S Ceftriaxone                    R Cefuroxime                     R Ciprofloxacin                  S Ertapenem                      S Gentamicin                     S Imipenem                       S Levofloxacin                   S Meropenem                      S Tetracycline                   S Tobramycin                     S Trimethoprim/Sulfa             S     Objective: There were no vitals filed for this visit.  General: Patient is awake, alert, oriented x 3 and in no acute distress.  Dermatology: Skin is warm and dry bilateral with a partial thickness ulceration present right plantar hallux proximal that is now closed over and a continued plantar distal ulceration that measures 1cm x 1.5 cm x 0.4cm. There is a keratotic border with a fibrogranular base. The ulcerations do not probe to bone. There is no malodor, no active drainage, decreased erythema, decreased edema. No other acute signs of infection.   Vascular: Dorsalis Pedis pulse =  1/4 Bilateral,  Posterior Tibial pulse = 0/4 Bilateral,  Capillary Fill Time < 5 seconds. Chronic venous skin changes.  Neurologic: Protective sensation absent to the level of the ankles bilateral using  the 5.07/10g [employer].  Musculosketal: No Pain with palpation to ulcerated area. No pain with compression to calves bilateral. Planus and digital deformity with rigid hallux extensus on right.   No results for input(s): GRAMSTAIN, LABORGA in the last 8760 hours.  Assessment and Plan:  Problem List Items Addressed This Visit    None    Visit Diagnoses    Toe ulcer, right, with fat layer exposed (HCC)    -  Primary   Diabetic polyneuropathy associated with type 2 diabetes mellitus (HCC)       PVD (peripheral vascular disease) (HCC)       Deformity of foot, unspecified laterality         -Examined patient and  discussed the progression of the wound and treatment alternatives. - Excisionally dedbrided ulceration at right plantar hallux to healthy bleeding borders removing nonviable tissue using a sterile chisel blade. Wound measures post debridement as above. Wound was debrided to the level of the dermis with viable wound base exposed to promote healing. Hemostasis was achieved with manuel pressure. Patient tolerated procedure well without any discomfort or anesthesia necessary for this wound debridement.  -Applied  PRISMA and dry sterile dressing and instructed patient to continue with daily dressings at home consisting of the same with assistance from [facility] -Will consider Epifix graft for next visit; request placed again since now measurements are larger at the wound site -Added felt offloading padding to post op shoe  - Advised patient to go to the ER or return to office if the wound worsens or if constitutional symptoms are present. -Patient to return to office as scheduled for follow up care and evaluation or sooner if problems arise.  Asencion Islam, DPM

## 2017-03-04 ENCOUNTER — Telehealth: Payer: Self-pay | Admitting: *Deleted

## 2017-03-04 ENCOUNTER — Telehealth: Payer: Self-pay | Admitting: Sports Medicine

## 2017-03-04 NOTE — Telephone Encounter (Signed)
-----   Message from [place], [place] sent at 03/03/2017  5:35 PM EDT ----- Regarding: Request graft again for patient Distal wound measures larger at 1 cm by 1.5 x 0.4 in depth at right first toe Thanks Dr. Marylene Land

## 2017-03-04 NOTE — Telephone Encounter (Addendum)
I informed Melissa, RN - [facility] to continue the 2x week for 8 weeks.

## 2017-03-04 NOTE — Telephone Encounter (Signed)
Faxed new case - required form, Clinicals and demographics to [facility].

## 2017-03-04 NOTE — Telephone Encounter (Signed)
This is Hospital doctor, Charity fundraiser with [facility]. Skilled nursing is going to continue to see her twice a week for eight weeks for wound care. We just need verbal or verbal approval for those particular orders. If somebody could contact me back at [phone], I would greatly appreciate it. Thanks and have a great day.

## 2017-03-07 DIAGNOSIS — E11621 Type 2 diabetes mellitus with foot ulcer: Secondary | ICD-10-CM | POA: Diagnosis not present

## 2017-03-07 DIAGNOSIS — E1151 Type 2 diabetes mellitus with diabetic peripheral angiopathy without gangrene: Secondary | ICD-10-CM | POA: Diagnosis not present

## 2017-03-07 DIAGNOSIS — I2510 Atherosclerotic heart disease of native coronary artery without angina pectoris: Secondary | ICD-10-CM | POA: Diagnosis not present

## 2017-03-07 DIAGNOSIS — M069 Rheumatoid arthritis, unspecified: Secondary | ICD-10-CM | POA: Diagnosis not present

## 2017-03-07 DIAGNOSIS — I10 Essential (primary) hypertension: Secondary | ICD-10-CM | POA: Diagnosis not present

## 2017-03-07 DIAGNOSIS — L97511 Non-pressure chronic ulcer of other part of right foot limited to breakdown of skin: Secondary | ICD-10-CM | POA: Diagnosis not present

## 2017-03-10 DIAGNOSIS — L97511 Non-pressure chronic ulcer of other part of right foot limited to breakdown of skin: Secondary | ICD-10-CM | POA: Diagnosis not present

## 2017-03-10 DIAGNOSIS — I2510 Atherosclerotic heart disease of native coronary artery without angina pectoris: Secondary | ICD-10-CM | POA: Diagnosis not present

## 2017-03-10 DIAGNOSIS — I10 Essential (primary) hypertension: Secondary | ICD-10-CM | POA: Diagnosis not present

## 2017-03-10 DIAGNOSIS — M069 Rheumatoid arthritis, unspecified: Secondary | ICD-10-CM | POA: Diagnosis not present

## 2017-03-10 DIAGNOSIS — E11621 Type 2 diabetes mellitus with foot ulcer: Secondary | ICD-10-CM | POA: Diagnosis not present

## 2017-03-10 DIAGNOSIS — E1151 Type 2 diabetes mellitus with diabetic peripheral angiopathy without gangrene: Secondary | ICD-10-CM | POA: Diagnosis not present

## 2017-03-10 NOTE — Telephone Encounter (Signed)
Thank you :)

## 2017-03-10 NOTE — Telephone Encounter (Addendum)
Receive [facility] fax pt's insurances will cover [phone] #10 applications with JC modifier for skin substitute. 03/10/2017-[place] - Contractor states pt has 2 insurance and should have to pay nothing. Left message on pt's home phone to call for information. Unable to leave message, [employer] customer is not available. Pt returned my call states she has an appt with Dr. Marylene Land 03/10/2017 11:00am in [place], and would like to try the graft after discussing with Dr. Marylene Land.

## 2017-03-11 ENCOUNTER — Ambulatory Visit (INDEPENDENT_AMBULATORY_CARE_PROVIDER_SITE_OTHER): Payer: Medicare Other | Admitting: Sports Medicine

## 2017-03-11 ENCOUNTER — Ambulatory Visit: Payer: Medicare Other | Admitting: Sports Medicine

## 2017-03-11 ENCOUNTER — Encounter: Payer: Self-pay | Admitting: Sports Medicine

## 2017-03-11 DIAGNOSIS — I739 Peripheral vascular disease, unspecified: Secondary | ICD-10-CM

## 2017-03-11 DIAGNOSIS — M21969 Unspecified acquired deformity of unspecified lower leg: Secondary | ICD-10-CM

## 2017-03-11 DIAGNOSIS — E1142 Type 2 diabetes mellitus with diabetic polyneuropathy: Secondary | ICD-10-CM

## 2017-03-11 DIAGNOSIS — L97512 Non-pressure chronic ulcer of other part of right foot with fat layer exposed: Secondary | ICD-10-CM

## 2017-03-11 NOTE — Progress Notes (Signed)
Subjective: [name] is a 76 y.o. female patient seen in office for follow up evaluation of ulceration of the right 1st toe. Patient has a history of diabetes and a blood glucose level today not recorded.  Patient is changing the dressing using collagen dressing at home with help of daughter and home nursing 2x per week; Patient admits that it is slowly getting better but she has been feeling best lately with side pain, denies nausea/fever/vomiting/chills/night sweats/shortness of breath/chest pain. Patient has no other pedal complaints at this time.  Patient Active Problem List   Diagnosis Date Noted  . Coronary artery disease 12/08/2010  . Dyspnea 12/08/2010  . HYPERTENSION, BENIGN 04/01/2010  . RHEUMATOID ARTHRITIS 04/01/2010  . ABNORMAL CV (STRESS) TEST 04/01/2010  . LEG PAIN, RIGHT 03/12/2010   Current Outpatient Medications on File Prior to Visit  Medication Sig Dispense Refill  . B-D ULTRAFINE III SHORT PEN 31G X 8 MM MISC     . cloNIDine (CATAPRES) 0.1 MG tablet Take 1 tablet by mouth Three times a day.    . diazepam (VALIUM) 5 MG tablet Take 5 mg by mouth 3 (three) times daily.      Marland Kitchen doxycycline (VIBRA-TABS) 100 MG tablet Take 1 tablet (100 mg total) by mouth 2 (two) times daily. 20 tablet 0  . ezetimibe-simvastatin (VYTORIN) 10-20 MG per tablet Take 1 tablet by mouth at bedtime.      Marland Kitchen FREESTYLE LITE test strip     . furosemide (LASIX) 40 MG tablet Take 40 mg by mouth daily.      . insulin NPH-insulin regular (NOVOLIN 70/30) (70-30) 100 UNIT/ML injection Inject 48 Units into the skin daily with breakfast. 10 units in p.m.     Marland Kitchen L-Methylfolate-B6-B12 (METANX) 2.8-25-2 MG TABS Take 1 tablet by mouth daily.      . lansoprazole (PREVACID) 30 MG capsule Take 30 mg by mouth daily.      Marland Kitchen levothyroxine (SYNTHROID, LEVOTHROID) 75 MCG tablet Take 75 mcg by mouth daily.      . metFORMIN (GLUCOPHAGE) 1000 MG tablet Take 500 mg by mouth 3 (three) times daily. Take 1/2 tab twice a day     . mupirocin ointment (BACTROBAN) 2 % To right toe ulcer 30 g 0  . oxyCODONE (OXY IR/ROXICODONE) 5 MG immediate release tablet Take 5 mg by mouth 2 (two) times daily.      Marland Kitchen oxyCODONE (OXYCONTIN) 40 MG 12 hr tablet Take 40 mg by mouth every 8 (eight) hours.      . potassium chloride (KLOR-CON 10) 10 MEQ CR tablet Take 10 mEq by mouth 2 (two) times daily. When taking furosemide     . predniSONE (DELTASONE) 5 MG tablet Take 5 mg by mouth 2 (two) times daily.      Marland Kitchen triamcinolone (KENALOG) 0.1 % cream     . valsartan (DIOVAN) 320 MG tablet Take 1 tablet (320 mg total) by mouth daily. 30 tablet 1   No current facility-administered medications on file prior to visit.    Allergies  Allergen Reactions  . Gold Anaphylaxis    Swelling of lips/tongue, throat Swelling of lips/tongue, throat  . Nsaids Other (See Comments)    Intolerance Intolerance  . Sulfa Antibiotics Nausea Only  . Sulfonamide Derivatives   . Latex Other (See Comments)    blisters  . Sulfamethoxazole Nausea Only    Recent Results (from the past 2160 hour(s))  WOUND CULTURE     Status: Abnormal   Collection Time: 12/17/16  12:00 AM  Result Value Ref Range   Gram Stain Result CANCELED     Comment: The specimen submitted does not meet the laboratory's criteria for acceptability. Refer to [facility] for specimen acceptability criteria.  Result canceled by the ancillary    Aerobic Bacterial Culture Final report (A)    Organism ID, Bacteria Klebsiella oxytoca (A)     Comment: Heavy growth   Organism ID, Bacteria Routine flora     Comment: Heavy growth   Antimicrobial Susceptibility Comment     Comment:       ** S = Susceptible; I = Intermediate; R = Resistant **                    P = Positive; N = Negative             MICS are expressed in micrograms per mL    Antibiotic                 RSLT#1    RSLT#2    RSLT#3    RSLT#4 Amoxicillin/Clavulanic Acid    R Ampicillin                      R Cefazolin                      R Cefepime                       S Ceftriaxone                    R Cefuroxime                     R Ciprofloxacin                  S Ertapenem                      S Gentamicin                     S Imipenem                       S Levofloxacin                   S Meropenem                      S Tetracycline                   S Tobramycin                     S Trimethoprim/Sulfa             S     Objective: There were no vitals filed for this visit.  General: Patient is awake, alert, oriented x 3 and in no acute distress.  Dermatology: Skin is warm and dry bilateral with a partial thickness ulceration present right plantar hallux proximal that is now closed over and a continued plantar distal ulceration that measures 1cm x 1 cm x 0.4cm. There is a keratotic border with a fibrogranular base. The ulcerations do not probe to bone. There is no malodor, no active drainage, decreased erythema, decreased edema. No other acute signs of infection.   Vascular: Dorsalis Pedis pulse = 1/4 Bilateral,  Posterior Tibial pulse = 0/4 Bilateral,  Capillary Fill Time < 5 seconds. Chronic venous skin changes.  Neurologic: Protective sensation absent to the level of the ankles bilateral using  the 5.07/10g [employer].  Musculosketal: No Pain with palpation to ulcerated area. No pain with compression to calves bilateral. Planus and digital deformity with rigid hallux extensus on right.   No results for input(s): GRAMSTAIN, LABORGA in the last 8760 hours.  Assessment and Plan:  Problem List Items Addressed This Visit    None    Visit Diagnoses    Toe ulcer, right, with fat layer exposed (HCC)    -  Primary   Diabetic polyneuropathy associated with type 2 diabetes mellitus (HCC)       PVD (peripheral vascular disease) (HCC)       Deformity of foot, unspecified laterality         -Examined patient and discussed the progression of the wound and  treatment alternatives. - Excisionally dedbrided ulceration at right plantar hallux to healthy bleeding borders removing nonviable tissue using a sterile chisel blade. Wound measures post debridement as above. Wound was debrided to the level of the dermis with viable wound base exposed to promote healing. Hemostasis was achieved with manuel pressure. Patient tolerated procedure well without any discomfort or anesthesia necessary for this wound debridement.  -Applied epi-fix 24 millimeter disc reorder number GS 5024 GS 25- [id number] expires 10/31/2021 all use without waste secured with Steri-Strips and covered with PRISMA and dry sterile dressing and instructed patient to continue with daily dressings at home consisting of the same with assistance from [facility] replacing the Steri-Strips and Prisma -Continue with felt offloading padding to post op shoe  - Advised patient to go to the ER or return to office if the wound worsens or if constitutional symptoms are present. -Patient to return to office as scheduled for follow up care/reapplication of epi fix and evaluation or sooner if problems arise.  Asencion Islam, DPM

## 2017-03-14 DIAGNOSIS — L97511 Non-pressure chronic ulcer of other part of right foot limited to breakdown of skin: Secondary | ICD-10-CM | POA: Diagnosis not present

## 2017-03-14 DIAGNOSIS — E11621 Type 2 diabetes mellitus with foot ulcer: Secondary | ICD-10-CM | POA: Diagnosis not present

## 2017-03-14 DIAGNOSIS — M069 Rheumatoid arthritis, unspecified: Secondary | ICD-10-CM | POA: Diagnosis not present

## 2017-03-14 DIAGNOSIS — E1151 Type 2 diabetes mellitus with diabetic peripheral angiopathy without gangrene: Secondary | ICD-10-CM | POA: Diagnosis not present

## 2017-03-14 DIAGNOSIS — I10 Essential (primary) hypertension: Secondary | ICD-10-CM | POA: Diagnosis not present

## 2017-03-14 DIAGNOSIS — I2510 Atherosclerotic heart disease of native coronary artery without angina pectoris: Secondary | ICD-10-CM | POA: Diagnosis not present

## 2017-03-15 ENCOUNTER — Telehealth: Payer: Self-pay | Admitting: Sports Medicine

## 2017-03-15 ENCOUNTER — Telehealth: Payer: Self-pay | Admitting: *Deleted

## 2017-03-15 NOTE — Telephone Encounter (Signed)
Faxed orders to [facility] - April, RN for daily dressing changes to right plantar hallux, replace steri-strips as peel away, cover area with Prisma and dry sterile dressing.

## 2017-03-15 NOTE — Telephone Encounter (Addendum)
Emailed order to [employer] for 2nd EpiFix 59mm for application in [place] 03/23/2017.

## 2017-03-15 NOTE — Telephone Encounter (Signed)
This is [facility] with [facility]. I know that she has the Epifix graft and my previous experience is to change the outer graft. Dr. Marylene Land has said something about putting calcium alginate on top of it. If you could fax those orders to our office at [phone] or you can call me back at [phone]. Thank you.

## 2017-03-16 DIAGNOSIS — M0579 Rheumatoid arthritis with rheumatoid factor of multiple sites without organ or systems involvement: Secondary | ICD-10-CM | POA: Diagnosis not present

## 2017-03-16 DIAGNOSIS — M4802 Spinal stenosis, cervical region: Secondary | ICD-10-CM | POA: Diagnosis not present

## 2017-03-16 DIAGNOSIS — E1042 Type 1 diabetes mellitus with diabetic polyneuropathy: Secondary | ICD-10-CM | POA: Insufficient documentation

## 2017-03-16 DIAGNOSIS — M4312 Spondylolisthesis, cervical region: Secondary | ICD-10-CM | POA: Diagnosis not present

## 2017-03-16 HISTORY — DX: Type 1 diabetes mellitus with diabetic polyneuropathy: E10.42

## 2017-03-16 NOTE — Telephone Encounter (Signed)
This is Jeanice Lim again calling from [facility]. My supervisor wanted me to reach out and to verify that this prisma is supposed to be done daily. I'm not sure her daughter will be able to do it on the days that we are not there. If you would give me a call back so we can talk about it. My number is [phone]. Thanks so much. Bye bye.

## 2017-03-17 DIAGNOSIS — M069 Rheumatoid arthritis, unspecified: Secondary | ICD-10-CM | POA: Diagnosis not present

## 2017-03-17 DIAGNOSIS — E11621 Type 2 diabetes mellitus with foot ulcer: Secondary | ICD-10-CM | POA: Diagnosis not present

## 2017-03-17 DIAGNOSIS — L97511 Non-pressure chronic ulcer of other part of right foot limited to breakdown of skin: Secondary | ICD-10-CM | POA: Diagnosis not present

## 2017-03-17 DIAGNOSIS — I2510 Atherosclerotic heart disease of native coronary artery without angina pectoris: Secondary | ICD-10-CM | POA: Diagnosis not present

## 2017-03-17 DIAGNOSIS — E1151 Type 2 diabetes mellitus with diabetic peripheral angiopathy without gangrene: Secondary | ICD-10-CM | POA: Diagnosis not present

## 2017-03-17 DIAGNOSIS — I10 Essential (primary) hypertension: Secondary | ICD-10-CM | POA: Diagnosis not present

## 2017-03-18 ENCOUNTER — Telehealth: Payer: Self-pay | Admitting: Sports Medicine

## 2017-03-18 NOTE — Telephone Encounter (Signed)
I have been calling both [place] and [facility] [place] since before 3 pm today trying to speak to the nurse or Dr. Marylene Land. I have a question I need to ask. My number is [phone]. Thank you.

## 2017-03-18 NOTE — Telephone Encounter (Signed)
Pt states the nurse told her that the dressings were to be change daily, and by the nurse or pt's dtr.

## 2017-03-18 NOTE — Telephone Encounter (Signed)
Left message for [place], [place] - [facility] to change pt's dressing qod leave graft in place on right hallux, replace steri-strips as peel up, cover with Prisma and dry sterile dressings, dressing changes may be every day if [facility] nurse can instruct pt's dtr.

## 2017-03-21 DIAGNOSIS — E1151 Type 2 diabetes mellitus with diabetic peripheral angiopathy without gangrene: Secondary | ICD-10-CM | POA: Diagnosis not present

## 2017-03-21 DIAGNOSIS — I2510 Atherosclerotic heart disease of native coronary artery without angina pectoris: Secondary | ICD-10-CM | POA: Diagnosis not present

## 2017-03-21 DIAGNOSIS — E11621 Type 2 diabetes mellitus with foot ulcer: Secondary | ICD-10-CM | POA: Diagnosis not present

## 2017-03-21 DIAGNOSIS — I10 Essential (primary) hypertension: Secondary | ICD-10-CM | POA: Diagnosis not present

## 2017-03-21 DIAGNOSIS — L97511 Non-pressure chronic ulcer of other part of right foot limited to breakdown of skin: Secondary | ICD-10-CM | POA: Diagnosis not present

## 2017-03-21 DIAGNOSIS — M069 Rheumatoid arthritis, unspecified: Secondary | ICD-10-CM | POA: Diagnosis not present

## 2017-03-23 ENCOUNTER — Ambulatory Visit (INDEPENDENT_AMBULATORY_CARE_PROVIDER_SITE_OTHER): Payer: Medicare Other | Admitting: Sports Medicine

## 2017-03-23 ENCOUNTER — Encounter: Payer: Self-pay | Admitting: Sports Medicine

## 2017-03-23 DIAGNOSIS — L97512 Non-pressure chronic ulcer of other part of right foot with fat layer exposed: Secondary | ICD-10-CM | POA: Diagnosis not present

## 2017-03-23 DIAGNOSIS — E1142 Type 2 diabetes mellitus with diabetic polyneuropathy: Secondary | ICD-10-CM

## 2017-03-23 DIAGNOSIS — I739 Peripheral vascular disease, unspecified: Secondary | ICD-10-CM

## 2017-03-23 DIAGNOSIS — M21969 Unspecified acquired deformity of unspecified lower leg: Secondary | ICD-10-CM

## 2017-03-23 NOTE — Progress Notes (Signed)
Subjective: [name] is a 76 y.o. female patient seen in office for follow up evaluation of ulceration of the right 1st toe, Epifix graft #1 was applied last visit. Patient has a history of diabetes and a blood glucose level today not recorded.  Patient is changing the dressing using collagen dressing at home with help of daughter and home nursing 2x per week; Patient admits that it is slowly getting better but feel unsteady in post op shoe, denies nausea/fever/vomiting/chills/night sweats/shortness of breath/chest pain. Patient has no other pedal complaints at this time.  Patient Active Problem List   Diagnosis Date Noted  . Coronary artery disease 12/08/2010  . Dyspnea 12/08/2010  . HYPERTENSION, BENIGN 04/01/2010  . RHEUMATOID ARTHRITIS 04/01/2010  . ABNORMAL CV (STRESS) TEST 04/01/2010  . LEG PAIN, RIGHT 03/12/2010   Current Outpatient Medications on File Prior to Visit  Medication Sig Dispense Refill  . B-D ULTRAFINE III SHORT PEN 31G X 8 MM MISC     . cloNIDine (CATAPRES) 0.1 MG tablet Take 1 tablet by mouth Three times a day.    . diazepam (VALIUM) 5 MG tablet Take 5 mg by mouth 3 (three) times daily.      Marland Kitchen doxycycline (VIBRA-TABS) 100 MG tablet Take 1 tablet (100 mg total) by mouth 2 (two) times daily. 20 tablet 0  . ezetimibe-simvastatin (VYTORIN) 10-20 MG per tablet Take 1 tablet by mouth at bedtime.      Marland Kitchen FREESTYLE LITE test strip     . furosemide (LASIX) 40 MG tablet Take 40 mg by mouth daily.      . insulin NPH-insulin regular (NOVOLIN 70/30) (70-30) 100 UNIT/ML injection Inject 48 Units into the skin daily with breakfast. 10 units in p.m.     Marland Kitchen L-Methylfolate-B6-B12 (METANX) 2.8-25-2 MG TABS Take 1 tablet by mouth daily.      . lansoprazole (PREVACID) 30 MG capsule Take 30 mg by mouth daily.      Marland Kitchen levothyroxine (SYNTHROID, LEVOTHROID) 75 MCG tablet Take 75 mcg by mouth daily.      . metFORMIN (GLUCOPHAGE) 1000 MG tablet Take 500 mg by mouth 3 (three) times daily. Take  1/2 tab twice a day    . mupirocin ointment (BACTROBAN) 2 % To right toe ulcer 30 g 0  . oxyCODONE (OXY IR/ROXICODONE) 5 MG immediate release tablet Take 5 mg by mouth 2 (two) times daily.      Marland Kitchen oxyCODONE (OXYCONTIN) 40 MG 12 hr tablet Take 40 mg by mouth every 8 (eight) hours.      . potassium chloride (KLOR-CON 10) 10 MEQ CR tablet Take 10 mEq by mouth 2 (two) times daily. When taking furosemide     . predniSONE (DELTASONE) 5 MG tablet Take 5 mg by mouth 2 (two) times daily.      Marland Kitchen triamcinolone (KENALOG) 0.1 % cream     . valsartan (DIOVAN) 320 MG tablet Take 1 tablet (320 mg total) by mouth daily. 30 tablet 1   No current facility-administered medications on file prior to visit.    Allergies  Allergen Reactions  . Gold Anaphylaxis    Swelling of lips/tongue, throat Swelling of lips/tongue, throat  . Nsaids Other (See Comments)    Intolerance Intolerance  . Sulfa Antibiotics Nausea Only  . Sulfonamide Derivatives   . Latex Other (See Comments)    blisters  . Sulfamethoxazole Nausea Only    No results found for this or any previous visit (from the past 2160 hour(s)).  Objective: There  were no vitals filed for this visit.  General: Patient is awake, alert, oriented x 3 and in no acute distress.  Dermatology: Skin is warm and dry bilateral with a partial thickness ulceration present right plantar hallux distal ulceration that measures 0.3cm x 0.3cm x 0.2cm. There is a keratotic border with a fibrogranular base. The ulceration does not probe to bone. There is no malodor, no active drainage, decreased erythema, decreased edema. No other acute signs of infection.   Vascular: Dorsalis Pedis pulse = 1/4 Bilateral,  Posterior Tibial pulse = 0/4 Bilateral,  Capillary Fill Time < 5 seconds. Chronic venous skin changes.  Neurologic: Protective sensation absent to the level of the ankles bilateral using  the 5.07/10g [employer].  Musculosketal: No Pain with palpation  to ulcerated area. No pain with compression to calves bilateral. Planus and digital deformity with rigid hallux extensus on right.   No results for input(s): GRAMSTAIN, LABORGA in the last 8760 hours.  Assessment and Plan:  Problem List Items Addressed This Visit    None    Visit Diagnoses    Toe ulcer, right, with fat layer exposed (HCC)    -  Primary   Diabetic polyneuropathy associated with type 2 diabetes mellitus (HCC)       PVD (peripheral vascular disease) (HCC)       Deformity of foot, unspecified laterality         -Examined patient and discussed the progression of the wound and treatment alternatives. - Excisionally dedbrided ulceration at right plantar hallux to healthy bleeding borders removing nonviable tissue using a sterile chisel blade. Wound measures post debridement as above. Wound was debrided to the level of the dermis with viable wound base exposed to promote healing. Hemostasis was achieved with manuel pressure. Patient tolerated procedure well without any discomfort or anesthesia necessary for this wound debridement.  -Applied epi-fix 24 millimeter disc reorder number GS 5024 GS 25- [id number] expires 12/01/2021 , this is graft #2 to site, all used without waste secured with Steri-Strips and covered with PRISMA and dry sterile dressing and instructed patient to continue with daily dressings at home consisting of the same with assistance from [facility] replacing the Steri-Strips and Prisma -Continue with felt offloading padding to post op shoe changed out padding and advised patient that if shoe still isn't comfortable may try tennis shoe or other post op shoe she has at home. - Advised patient to go to the ER or return to office if the wound worsens or if constitutional symptoms are present. -Patient to return to office as scheduled in 2 weeks for follow up care/reapplication of epi fix and evaluation or sooner if problems arise.  Asencion Islam, DPM

## 2017-03-26 DIAGNOSIS — E11621 Type 2 diabetes mellitus with foot ulcer: Secondary | ICD-10-CM | POA: Diagnosis not present

## 2017-03-26 DIAGNOSIS — I10 Essential (primary) hypertension: Secondary | ICD-10-CM | POA: Diagnosis not present

## 2017-03-26 DIAGNOSIS — E1151 Type 2 diabetes mellitus with diabetic peripheral angiopathy without gangrene: Secondary | ICD-10-CM | POA: Diagnosis not present

## 2017-03-26 DIAGNOSIS — L97511 Non-pressure chronic ulcer of other part of right foot limited to breakdown of skin: Secondary | ICD-10-CM | POA: Diagnosis not present

## 2017-03-26 DIAGNOSIS — I2510 Atherosclerotic heart disease of native coronary artery without angina pectoris: Secondary | ICD-10-CM | POA: Diagnosis not present

## 2017-03-26 DIAGNOSIS — M069 Rheumatoid arthritis, unspecified: Secondary | ICD-10-CM | POA: Diagnosis not present

## 2017-03-29 DIAGNOSIS — E11621 Type 2 diabetes mellitus with foot ulcer: Secondary | ICD-10-CM | POA: Diagnosis not present

## 2017-03-29 DIAGNOSIS — I2510 Atherosclerotic heart disease of native coronary artery without angina pectoris: Secondary | ICD-10-CM | POA: Diagnosis not present

## 2017-03-29 DIAGNOSIS — M069 Rheumatoid arthritis, unspecified: Secondary | ICD-10-CM | POA: Diagnosis not present

## 2017-03-29 DIAGNOSIS — L97511 Non-pressure chronic ulcer of other part of right foot limited to breakdown of skin: Secondary | ICD-10-CM | POA: Diagnosis not present

## 2017-03-29 DIAGNOSIS — I10 Essential (primary) hypertension: Secondary | ICD-10-CM | POA: Diagnosis not present

## 2017-03-29 DIAGNOSIS — E1151 Type 2 diabetes mellitus with diabetic peripheral angiopathy without gangrene: Secondary | ICD-10-CM | POA: Diagnosis not present

## 2017-03-29 NOTE — Telephone Encounter (Signed)
Emailed order for 3rd EpiFix 10mm for application in [place] 04/08/2017 at 10:30am to [facility] - [facility].

## 2017-03-31 DIAGNOSIS — E11621 Type 2 diabetes mellitus with foot ulcer: Secondary | ICD-10-CM | POA: Diagnosis not present

## 2017-03-31 DIAGNOSIS — M069 Rheumatoid arthritis, unspecified: Secondary | ICD-10-CM | POA: Diagnosis not present

## 2017-03-31 DIAGNOSIS — E1151 Type 2 diabetes mellitus with diabetic peripheral angiopathy without gangrene: Secondary | ICD-10-CM | POA: Diagnosis not present

## 2017-03-31 DIAGNOSIS — I10 Essential (primary) hypertension: Secondary | ICD-10-CM | POA: Diagnosis not present

## 2017-03-31 DIAGNOSIS — I2510 Atherosclerotic heart disease of native coronary artery without angina pectoris: Secondary | ICD-10-CM | POA: Diagnosis not present

## 2017-03-31 DIAGNOSIS — L97511 Non-pressure chronic ulcer of other part of right foot limited to breakdown of skin: Secondary | ICD-10-CM | POA: Diagnosis not present

## 2017-04-04 DIAGNOSIS — E1151 Type 2 diabetes mellitus with diabetic peripheral angiopathy without gangrene: Secondary | ICD-10-CM | POA: Diagnosis not present

## 2017-04-04 DIAGNOSIS — I2510 Atherosclerotic heart disease of native coronary artery without angina pectoris: Secondary | ICD-10-CM | POA: Diagnosis not present

## 2017-04-04 DIAGNOSIS — I10 Essential (primary) hypertension: Secondary | ICD-10-CM | POA: Diagnosis not present

## 2017-04-04 DIAGNOSIS — M069 Rheumatoid arthritis, unspecified: Secondary | ICD-10-CM | POA: Diagnosis not present

## 2017-04-04 DIAGNOSIS — L97511 Non-pressure chronic ulcer of other part of right foot limited to breakdown of skin: Secondary | ICD-10-CM | POA: Diagnosis not present

## 2017-04-04 DIAGNOSIS — E11621 Type 2 diabetes mellitus with foot ulcer: Secondary | ICD-10-CM | POA: Diagnosis not present

## 2017-04-08 ENCOUNTER — Ambulatory Visit (INDEPENDENT_AMBULATORY_CARE_PROVIDER_SITE_OTHER): Payer: Medicare Other | Admitting: Sports Medicine

## 2017-04-08 ENCOUNTER — Encounter: Payer: Self-pay | Admitting: Sports Medicine

## 2017-04-08 DIAGNOSIS — N3000 Acute cystitis without hematuria: Secondary | ICD-10-CM | POA: Diagnosis not present

## 2017-04-08 DIAGNOSIS — M21969 Unspecified acquired deformity of unspecified lower leg: Secondary | ICD-10-CM | POA: Diagnosis not present

## 2017-04-08 DIAGNOSIS — L97512 Non-pressure chronic ulcer of other part of right foot with fat layer exposed: Secondary | ICD-10-CM | POA: Diagnosis not present

## 2017-04-08 DIAGNOSIS — E1142 Type 2 diabetes mellitus with diabetic polyneuropathy: Secondary | ICD-10-CM | POA: Diagnosis not present

## 2017-04-08 DIAGNOSIS — I739 Peripheral vascular disease, unspecified: Secondary | ICD-10-CM

## 2017-04-08 NOTE — Progress Notes (Signed)
Subjective: [name] is a 76 y.o. female patient seen in office for follow up evaluation of ulceration of the right 1st toe, Epifix graft #2 was applied last visit. Patient has a history of diabetes and a blood glucose level today not recorded.  Patient is changing the dressing using collagen dressing at home with help of daughter and home nursing 2x per week; Patient admits that she started back on her Rheumatology medications and is wondering if this is adding to slow healing, denies nausea/fever/vomiting/chills/night sweats/shortness of breath/chest pain. Patient has no other pedal complaints at this time.  Patient Active Problem List   Diagnosis Date Noted  . Coronary artery disease 12/08/2010  . Dyspnea 12/08/2010  . HYPERTENSION, BENIGN 04/01/2010  . RHEUMATOID ARTHRITIS 04/01/2010  . ABNORMAL CV (STRESS) TEST 04/01/2010  . LEG PAIN, RIGHT 03/12/2010   Current Outpatient Medications on File Prior to Visit  Medication Sig Dispense Refill  . B-D ULTRAFINE III SHORT PEN 31G X 8 MM MISC     . cloNIDine (CATAPRES) 0.1 MG tablet Take 1 tablet by mouth Three times a day.    . diazepam (VALIUM) 5 MG tablet Take 5 mg by mouth 3 (three) times daily.      Marland Kitchen doxycycline (VIBRA-TABS) 100 MG tablet Take 1 tablet (100 mg total) by mouth 2 (two) times daily. 20 tablet 0  . ezetimibe-simvastatin (VYTORIN) 10-20 MG per tablet Take 1 tablet by mouth at bedtime.      Marland Kitchen FREESTYLE LITE test strip     . furosemide (LASIX) 40 MG tablet Take 40 mg by mouth daily.      . insulin NPH-insulin regular (NOVOLIN 70/30) (70-30) 100 UNIT/ML injection Inject 48 Units into the skin daily with breakfast. 10 units in p.m.     Marland Kitchen L-Methylfolate-B6-B12 (METANX) 2.8-25-2 MG TABS Take 1 tablet by mouth daily.      . lansoprazole (PREVACID) 30 MG capsule Take 30 mg by mouth daily.      Marland Kitchen levothyroxine (SYNTHROID, LEVOTHROID) 75 MCG tablet Take 75 mcg by mouth daily.      . metFORMIN (GLUCOPHAGE) 1000 MG tablet Take 500  mg by mouth 3 (three) times daily. Take 1/2 tab twice a day    . mupirocin ointment (BACTROBAN) 2 % To right toe ulcer 30 g 0  . oxyCODONE (OXY IR/ROXICODONE) 5 MG immediate release tablet Take 5 mg by mouth 2 (two) times daily.      Marland Kitchen oxyCODONE (OXYCONTIN) 40 MG 12 hr tablet Take 40 mg by mouth every 8 (eight) hours.      . potassium chloride (KLOR-CON 10) 10 MEQ CR tablet Take 10 mEq by mouth 2 (two) times daily. When taking furosemide     . predniSONE (DELTASONE) 5 MG tablet Take 5 mg by mouth 2 (two) times daily.      Marland Kitchen triamcinolone (KENALOG) 0.1 % cream     . valsartan (DIOVAN) 320 MG tablet Take 1 tablet (320 mg total) by mouth daily. 30 tablet 1   No current facility-administered medications on file prior to visit.    Allergies  Allergen Reactions  . Gold Anaphylaxis    Swelling of lips/tongue, throat Swelling of lips/tongue, throat  . Nsaids Other (See Comments)    Intolerance Intolerance  . Sulfa Antibiotics Nausea Only  . Sulfonamide Derivatives   . Latex Other (See Comments)    blisters  . Sulfamethoxazole Nausea Only    No results found for this or any previous visit (from the past  2160 hour(s)).  Objective: There were no vitals filed for this visit.  General: Patient is awake, alert, oriented x 3 and in no acute distress.  Dermatology: Skin is warm and dry bilateral with a partial thickness ulceration present right plantar hallux distal ulceration that measures 0.3cm x 0.3cm x 0.2cm (same as previous). There is a keratotic border with a fibrogranular base. The ulceration does not probe to bone. There is no malodor, no active drainage, decreased erythema, decreased edema. No other acute signs of infection.   Vascular: Dorsalis Pedis pulse = 1/4 Bilateral,  Posterior Tibial pulse = 0/4 Bilateral,  Capillary Fill Time < 5 seconds. Chronic venous skin changes.  Neurologic: Protective sensation absent to the level of the ankles bilateral using  the 5.07/10g [employer].  Musculosketal: No Pain with palpation to ulcerated area. No pain with compression to calves bilateral. Planus and digital deformity with rigid hallux extensus on right.   No results for input(s): GRAMSTAIN, LABORGA in the last 8760 hours.  Assessment and Plan:  Problem List Items Addressed This Visit    None    Visit Diagnoses    Toe ulcer, right, with fat layer exposed (HCC)    -  Primary   Diabetic polyneuropathy associated with type 2 diabetes mellitus (HCC)       PVD (peripheral vascular disease) (HCC)       Deformity of foot, unspecified laterality         -Examined patient and discussed the progression of the wound and treatment alternatives. - Excisionally dedbrided ulceration at right plantar hallux to healthy bleeding borders removing nonviable tissue using a sterile chisel blade. Wound measures post debridement as above. Wound was debrided to the level of the dermis with viable wound base exposed to promote healing. Hemostasis was achieved with manuel pressure. Patient tolerated procedure well without any discomfort or anesthesia necessary for this wound debridement.  -Applied epi-fix 24 millimeter disc reorder number GS 5024 GS 25- [id number] expires 12/01/2021 , this is graft #3 to site, all used without waste secured with Steri-Strips and covered with PRISMA and dry sterile dressing and instructed patient to continue with daily dressings at home consisting of the same with assistance from [facility] replacing the Steri-Strips and Prisma 2x per week  -Continue with felt offloading padding to post op shoe - Advised patient to go to the ER or return to office if the wound worsens or if constitutional symptoms are present. -Patient to return to office as scheduled in 2 weeks for follow up care/reapplication of epi fix and evaluation or sooner if problems arise.  Asencion Islam, DPM

## 2017-04-11 DIAGNOSIS — I110 Hypertensive heart disease with heart failure: Secondary | ICD-10-CM | POA: Diagnosis present

## 2017-04-11 DIAGNOSIS — Z792 Long term (current) use of antibiotics: Secondary | ICD-10-CM | POA: Diagnosis not present

## 2017-04-11 DIAGNOSIS — E871 Hypo-osmolality and hyponatremia: Secondary | ICD-10-CM | POA: Diagnosis not present

## 2017-04-11 DIAGNOSIS — I639 Cerebral infarction, unspecified: Secondary | ICD-10-CM | POA: Diagnosis not present

## 2017-04-11 DIAGNOSIS — N390 Urinary tract infection, site not specified: Secondary | ICD-10-CM | POA: Diagnosis not present

## 2017-04-11 DIAGNOSIS — Z66 Do not resuscitate: Secondary | ICD-10-CM | POA: Diagnosis not present

## 2017-04-11 DIAGNOSIS — J986 Disorders of diaphragm: Secondary | ICD-10-CM | POA: Diagnosis not present

## 2017-04-11 DIAGNOSIS — I509 Heart failure, unspecified: Secondary | ICD-10-CM | POA: Diagnosis present

## 2017-04-11 DIAGNOSIS — I63233 Cerebral infarction due to unspecified occlusion or stenosis of bilateral carotid arteries: Secondary | ICD-10-CM | POA: Diagnosis not present

## 2017-04-11 DIAGNOSIS — E1165 Type 2 diabetes mellitus with hyperglycemia: Secondary | ICD-10-CM | POA: Diagnosis not present

## 2017-04-11 DIAGNOSIS — Z794 Long term (current) use of insulin: Secondary | ICD-10-CM | POA: Diagnosis not present

## 2017-04-11 DIAGNOSIS — S90934D Unspecified superficial injury of right lesser toe(s), subsequent encounter: Secondary | ICD-10-CM | POA: Diagnosis not present

## 2017-04-11 DIAGNOSIS — Z7952 Long term (current) use of systemic steroids: Secondary | ICD-10-CM | POA: Diagnosis not present

## 2017-04-11 DIAGNOSIS — I63532 Cerebral infarction due to unspecified occlusion or stenosis of left posterior cerebral artery: Secondary | ICD-10-CM | POA: Diagnosis not present

## 2017-04-11 DIAGNOSIS — R4182 Altered mental status, unspecified: Secondary | ICD-10-CM | POA: Diagnosis not present

## 2017-04-11 DIAGNOSIS — M069 Rheumatoid arthritis, unspecified: Secondary | ICD-10-CM | POA: Diagnosis not present

## 2017-04-11 DIAGNOSIS — M0579 Rheumatoid arthritis with rheumatoid factor of multiple sites without organ or systems involvement: Secondary | ICD-10-CM | POA: Diagnosis present

## 2017-04-11 DIAGNOSIS — Z7984 Long term (current) use of oral hypoglycemic drugs: Secondary | ICD-10-CM | POA: Diagnosis not present

## 2017-04-11 DIAGNOSIS — I6789 Other cerebrovascular disease: Secondary | ICD-10-CM | POA: Diagnosis not present

## 2017-04-11 DIAGNOSIS — Z79899 Other long term (current) drug therapy: Secondary | ICD-10-CM | POA: Diagnosis not present

## 2017-04-11 DIAGNOSIS — R531 Weakness: Secondary | ICD-10-CM | POA: Diagnosis not present

## 2017-04-11 DIAGNOSIS — M7989 Other specified soft tissue disorders: Secondary | ICD-10-CM | POA: Diagnosis not present

## 2017-04-11 DIAGNOSIS — R51 Headache: Secondary | ICD-10-CM | POA: Diagnosis not present

## 2017-04-11 DIAGNOSIS — I63439 Cerebral infarction due to embolism of unspecified posterior cerebral artery: Secondary | ICD-10-CM | POA: Diagnosis not present

## 2017-04-11 DIAGNOSIS — E119 Type 2 diabetes mellitus without complications: Secondary | ICD-10-CM | POA: Diagnosis not present

## 2017-04-12 ENCOUNTER — Telehealth: Payer: Self-pay | Admitting: *Deleted

## 2017-04-12 DIAGNOSIS — I63233 Cerebral infarction due to unspecified occlusion or stenosis of bilateral carotid arteries: Secondary | ICD-10-CM | POA: Diagnosis not present

## 2017-04-12 DIAGNOSIS — E119 Type 2 diabetes mellitus without complications: Secondary | ICD-10-CM | POA: Diagnosis not present

## 2017-04-12 DIAGNOSIS — N390 Urinary tract infection, site not specified: Secondary | ICD-10-CM | POA: Diagnosis not present

## 2017-04-12 DIAGNOSIS — I639 Cerebral infarction, unspecified: Secondary | ICD-10-CM | POA: Diagnosis not present

## 2017-04-12 DIAGNOSIS — E871 Hypo-osmolality and hyponatremia: Secondary | ICD-10-CM | POA: Diagnosis not present

## 2017-04-12 DIAGNOSIS — I6789 Other cerebrovascular disease: Secondary | ICD-10-CM | POA: Diagnosis not present

## 2017-04-12 DIAGNOSIS — I63439 Cerebral infarction due to embolism of unspecified posterior cerebral artery: Secondary | ICD-10-CM | POA: Diagnosis not present

## 2017-04-12 DIAGNOSIS — M069 Rheumatoid arthritis, unspecified: Secondary | ICD-10-CM | POA: Diagnosis not present

## 2017-04-12 NOTE — Telephone Encounter (Signed)
Ordered 4th EpiFix 31mm for [place] office 04/22/2017 with Rosine Abe in [place] office.

## 2017-04-14 DIAGNOSIS — L97511 Non-pressure chronic ulcer of other part of right foot limited to breakdown of skin: Secondary | ICD-10-CM | POA: Diagnosis not present

## 2017-04-14 DIAGNOSIS — M069 Rheumatoid arthritis, unspecified: Secondary | ICD-10-CM | POA: Diagnosis not present

## 2017-04-14 DIAGNOSIS — E1151 Type 2 diabetes mellitus with diabetic peripheral angiopathy without gangrene: Secondary | ICD-10-CM | POA: Diagnosis not present

## 2017-04-14 DIAGNOSIS — I10 Essential (primary) hypertension: Secondary | ICD-10-CM | POA: Diagnosis not present

## 2017-04-14 DIAGNOSIS — E11621 Type 2 diabetes mellitus with foot ulcer: Secondary | ICD-10-CM | POA: Diagnosis not present

## 2017-04-14 DIAGNOSIS — I2510 Atherosclerotic heart disease of native coronary artery without angina pectoris: Secondary | ICD-10-CM | POA: Diagnosis not present

## 2017-04-15 ENCOUNTER — Telehealth: Payer: Self-pay | Admitting: *Deleted

## 2017-04-15 NOTE — Telephone Encounter (Signed)
[name] - [facility] states pt is discharged from [facility], and they need orders to resume HHC.

## 2017-04-15 NOTE — Telephone Encounter (Signed)
Continue same orders from her last office visit "PRISMA and dry sterile dressing and instructed patient to continue with daily dressings at home consisting of the same with assistance from [facility]" -Dr. Marylene Land

## 2017-04-15 NOTE — Telephone Encounter (Signed)
Orders called to [facility] - [facility].

## 2017-04-19 DIAGNOSIS — E1151 Type 2 diabetes mellitus with diabetic peripheral angiopathy without gangrene: Secondary | ICD-10-CM | POA: Diagnosis not present

## 2017-04-19 DIAGNOSIS — I2510 Atherosclerotic heart disease of native coronary artery without angina pectoris: Secondary | ICD-10-CM | POA: Diagnosis not present

## 2017-04-19 DIAGNOSIS — L97511 Non-pressure chronic ulcer of other part of right foot limited to breakdown of skin: Secondary | ICD-10-CM | POA: Diagnosis not present

## 2017-04-19 DIAGNOSIS — M069 Rheumatoid arthritis, unspecified: Secondary | ICD-10-CM | POA: Diagnosis not present

## 2017-04-19 DIAGNOSIS — E11621 Type 2 diabetes mellitus with foot ulcer: Secondary | ICD-10-CM | POA: Diagnosis not present

## 2017-04-19 DIAGNOSIS — I10 Essential (primary) hypertension: Secondary | ICD-10-CM | POA: Diagnosis not present

## 2017-04-20 ENCOUNTER — Telehealth: Payer: Self-pay | Admitting: Sports Medicine

## 2017-04-20 DIAGNOSIS — Z1339 Encounter for screening examination for other mental health and behavioral disorders: Secondary | ICD-10-CM | POA: Diagnosis not present

## 2017-04-20 DIAGNOSIS — Z9181 History of falling: Secondary | ICD-10-CM | POA: Diagnosis not present

## 2017-04-20 DIAGNOSIS — H53461 Homonymous bilateral field defects, right side: Secondary | ICD-10-CM | POA: Diagnosis not present

## 2017-04-20 DIAGNOSIS — I6930 Unspecified sequelae of cerebral infarction: Secondary | ICD-10-CM | POA: Diagnosis not present

## 2017-04-20 NOTE — Telephone Encounter (Signed)
Yes that's fine Thanks Dr. Marylene Land

## 2017-04-20 NOTE — Telephone Encounter (Signed)
Hello, this is Ladene Artist, physical therapist with [facility]. I'm calling to see if we can get a verbal okay for home health therapy two times a week for three weeks. My call back number is [phone] and you can leave a message if I do not answer.

## 2017-04-20 NOTE — Telephone Encounter (Signed)
Left message informing [name] - [facility], Dr. Marylene Land had okayed the recommended PT for pt.

## 2017-04-21 ENCOUNTER — Telehealth: Payer: Self-pay | Admitting: Sports Medicine

## 2017-04-21 DIAGNOSIS — E11621 Type 2 diabetes mellitus with foot ulcer: Secondary | ICD-10-CM | POA: Diagnosis not present

## 2017-04-21 DIAGNOSIS — I2510 Atherosclerotic heart disease of native coronary artery without angina pectoris: Secondary | ICD-10-CM | POA: Diagnosis not present

## 2017-04-21 DIAGNOSIS — M069 Rheumatoid arthritis, unspecified: Secondary | ICD-10-CM | POA: Diagnosis not present

## 2017-04-21 DIAGNOSIS — L97511 Non-pressure chronic ulcer of other part of right foot limited to breakdown of skin: Secondary | ICD-10-CM | POA: Diagnosis not present

## 2017-04-21 DIAGNOSIS — I10 Essential (primary) hypertension: Secondary | ICD-10-CM | POA: Diagnosis not present

## 2017-04-21 DIAGNOSIS — E1151 Type 2 diabetes mellitus with diabetic peripheral angiopathy without gangrene: Secondary | ICD-10-CM | POA: Diagnosis not present

## 2017-04-21 NOTE — Telephone Encounter (Signed)
Left message informing Neysa Bonito - [facility] of Dr. Wynema Birch 04/21/2017 2:21pm orders.

## 2017-04-21 NOTE — Telephone Encounter (Signed)
This is [place] with [facility]. Calling to get clarification orders for her wound care. She does have three wounds on her right foot. If you would give me a call back at [phone]. Thank you.

## 2017-04-21 NOTE — Telephone Encounter (Signed)
Last time I saw patient there was a wound at big toe and an abrasion at top and side of foot. Apply prisma to the open areas. I know the big toe is open that's the wound we have been treating but at the other areas there was no opening and I was protecting it with adaptic. So if its the same continue with this however if there is drainage then apply primsa to the areas as well. Thanks Dr. Marylene Land

## 2017-04-22 ENCOUNTER — Encounter: Payer: Self-pay | Admitting: Sports Medicine

## 2017-04-22 ENCOUNTER — Telehealth: Payer: Self-pay | Admitting: *Deleted

## 2017-04-22 ENCOUNTER — Ambulatory Visit (INDEPENDENT_AMBULATORY_CARE_PROVIDER_SITE_OTHER): Payer: Medicare Other | Admitting: Sports Medicine

## 2017-04-22 DIAGNOSIS — L97512 Non-pressure chronic ulcer of other part of right foot with fat layer exposed: Secondary | ICD-10-CM | POA: Diagnosis not present

## 2017-04-22 DIAGNOSIS — L97511 Non-pressure chronic ulcer of other part of right foot limited to breakdown of skin: Secondary | ICD-10-CM

## 2017-04-22 DIAGNOSIS — I739 Peripheral vascular disease, unspecified: Secondary | ICD-10-CM

## 2017-04-22 DIAGNOSIS — E1142 Type 2 diabetes mellitus with diabetic polyneuropathy: Secondary | ICD-10-CM

## 2017-04-22 DIAGNOSIS — T148XXA Other injury of unspecified body region, initial encounter: Secondary | ICD-10-CM

## 2017-04-22 DIAGNOSIS — M21969 Unspecified acquired deformity of unspecified lower leg: Secondary | ICD-10-CM

## 2017-04-22 NOTE — Telephone Encounter (Signed)
-----   Message from Asencion Islam, [place] sent at 04/22/2017  2:57 PM EST ----- Regarding: Re-order graft and wound care orders Reorder 24 mm disk for next visit  Wound care: Keep adaptic and steristrip layer in place to 1st toe and lateral foot where graft is. If there is drainage from these areas apply PRISMA. If there is no drainage my apply wound gel. To dorsal midfoot apply triple antibiotic cream abd padding to prevent rubbing and irritation 2x per week -Dr. Marylene Land

## 2017-04-22 NOTE — Telephone Encounter (Signed)
Emailed orders for 5th EpiFix 15mm for application in [place] 05/11/2017 at 3:30pm. Faxed copy of Dr. Wynema Birch 04/22/2017 2:57pm orders.

## 2017-04-22 NOTE — Progress Notes (Signed)
Subjective: [name] is a 76 y.o. female patient seen in office for follow up evaluation of ulceration of the right 1st toe, Epifix graft #3 was applied last visit. Patient has a history of diabetes and a blood glucose level today not recorded.  Patient is changing the dressing using collagen dressing at home with help of daughter and home nursing 2x per week; Patient admits that she had a stroke since last visit, denies nausea/fever/vomiting/chills/night sweats/shortness of breath/chest pain. Patient has no other pedal complaints at this time.  Patient Active Problem List   Diagnosis Date Noted  . Coronary artery disease 12/08/2010  . Dyspnea 12/08/2010  . HYPERTENSION, BENIGN 04/01/2010  . RHEUMATOID ARTHRITIS 04/01/2010  . ABNORMAL CV (STRESS) TEST 04/01/2010  . LEG PAIN, RIGHT 03/12/2010   Current Outpatient Medications on File Prior to Visit  Medication Sig Dispense Refill  . B-D ULTRAFINE III SHORT PEN 31G X 8 MM MISC     . cloNIDine (CATAPRES) 0.1 MG tablet Take 1 tablet by mouth Three times a day.    . diazepam (VALIUM) 5 MG tablet Take 5 mg by mouth 3 (three) times daily.      Marland Kitchen doxycycline (VIBRA-TABS) 100 MG tablet Take 1 tablet (100 mg total) by mouth 2 (two) times daily. 20 tablet 0  . ezetimibe-simvastatin (VYTORIN) 10-20 MG per tablet Take 1 tablet by mouth at bedtime.      Marland Kitchen FREESTYLE LITE test strip     . furosemide (LASIX) 40 MG tablet Take 40 mg by mouth daily.      . insulin NPH-insulin regular (NOVOLIN 70/30) (70-30) 100 UNIT/ML injection Inject 48 Units into the skin daily with breakfast. 10 units in p.m.     Marland Kitchen L-Methylfolate-B6-B12 (METANX) 2.8-25-2 MG TABS Take 1 tablet by mouth daily.      . lansoprazole (PREVACID) 30 MG capsule Take 30 mg by mouth daily.      Marland Kitchen levothyroxine (SYNTHROID, LEVOTHROID) 75 MCG tablet Take 75 mcg by mouth daily.      . metFORMIN (GLUCOPHAGE) 1000 MG tablet Take 500 mg by mouth 3 (three) times daily. Take 1/2 tab twice a day    .  mupirocin ointment (BACTROBAN) 2 % To right toe ulcer 30 g 0  . oxyCODONE (OXY IR/ROXICODONE) 5 MG immediate release tablet Take 5 mg by mouth 2 (two) times daily.      Marland Kitchen oxyCODONE (OXYCONTIN) 40 MG 12 hr tablet Take 40 mg by mouth every 8 (eight) hours.      . potassium chloride (KLOR-CON 10) 10 MEQ CR tablet Take 10 mEq by mouth 2 (two) times daily. When taking furosemide     . predniSONE (DELTASONE) 5 MG tablet Take 5 mg by mouth 2 (two) times daily.      Marland Kitchen triamcinolone (KENALOG) 0.1 % cream     . valsartan (DIOVAN) 320 MG tablet Take 1 tablet (320 mg total) by mouth daily. 30 tablet 1   No current facility-administered medications on file prior to visit.    Allergies  Allergen Reactions  . Gold Anaphylaxis    Swelling of lips/tongue, throat Swelling of lips/tongue, throat  . Nsaids Other (See Comments)    Intolerance Intolerance  . Sulfa Antibiotics Nausea Only  . Sulfonamide Derivatives   . Latex Other (See Comments)    blisters  . Sulfamethoxazole Nausea Only    No results found for this or any previous visit (from the past 2160 hour(s)).  Objective: There were no vitals filed for  this visit.  General: Patient is awake, alert, oriented x 3 and in no acute distress.  Dermatology: Skin is warm and dry bilateral with a partial thickness ulceration present right plantar hallux distal ulceration that measures 0.3cm x 0.3cm x 0.2cm (same as previous). There is a keratotic border with a fibrogranular base. The ulceration does not probe to bone. There is no malodor, no active drainage, decreased erythema, decreased edema. No other acute signs of infection. To lateral right fifth metatarsal there is a ulceration that is partial thickness in nature at the site of previous abrasion that measures 1.5 x 1.5 cm with a granular base there is no surrounding erythema or edema no other acute signs of infection.  To the dorsal midfoot there is an abrasion with a scab with no acute signs of  infection.   Vascular: Dorsalis Pedis pulse = 1/4 Bilateral,  Posterior Tibial pulse = 0/4 Bilateral,  Capillary Fill Time < 5 seconds. Chronic venous skin changes.  Neurologic: Protective sensation absent to the level of the ankles bilateral using  the 5.07/10g [employer].  Musculosketal: No Pain with palpation to ulcerated areas. No pain with compression to calves bilateral. Planus and digital deformity with rigid hallux extensus on right.   No results for input(s): GRAMSTAIN, LABORGA in the last 8760 hours.  Assessment and Plan:  Problem List Items Addressed This Visit    None    Visit Diagnoses    Toe ulcer, right, with fat layer exposed (HCC)    -  Primary   Foot ulcer, right, limited to breakdown of skin (HCC)       Abrasion       Diabetic polyneuropathy associated with type 2 diabetes mellitus (HCC)       PVD (peripheral vascular disease) (HCC)       Deformity of foot, unspecified laterality         -Examined patient and discussed the progression of the wound and treatment alternatives. - Excisionally dedbrided ulceration at right plantar hallux to healthy bleeding borders removing nonviable tissue using a sterile chisel blade. Wound measure fourth s post debridement as above. Wound was debrided to the level of the dermis with viable wound base exposed to promote healing. Hemostasis was achieved with manuel pressure. Patient tolerated procedure well without any discomfort or anesthesia necessary for this wound debridement.  -Applied epi-fix 24 millimeter disc reorder number [id number] 25- [id number] expires 12/01/2021 , this is graft #4 to site, all used without waste evenly divided amongst the first toe and right lateral foot ulcerations secured with Steri-Strips and covered with dry sterile dressing and instructed patient to continue with daily dressings at home consisting of the same with assistance from [facility] with wound care orders as follows Wound care:  Keep adaptic and steristrip layer in place to 1st toe and lateral foot where graft is. If there is drainage from these areas apply PRISMA. If there is no drainage my apply wound gel. To dorsal midfoot apply triple antibiotic cream abd padding to prevent rubbing and irritation 2x per week -Continue with felt offloading padding to post op shoe or wide tennis shoe - Advised patient to go to the ER or return to office if the wound worsens or if constitutional symptoms are present. -Patient to return to office as scheduled in 2 weeks for follow up care/reapplication of epi fix and evaluation or sooner if problems arise.  Asencion Islam, DPM

## 2017-04-27 DIAGNOSIS — I10 Essential (primary) hypertension: Secondary | ICD-10-CM | POA: Diagnosis not present

## 2017-04-27 DIAGNOSIS — L97511 Non-pressure chronic ulcer of other part of right foot limited to breakdown of skin: Secondary | ICD-10-CM | POA: Diagnosis not present

## 2017-04-27 DIAGNOSIS — I2510 Atherosclerotic heart disease of native coronary artery without angina pectoris: Secondary | ICD-10-CM | POA: Diagnosis not present

## 2017-04-27 DIAGNOSIS — E11621 Type 2 diabetes mellitus with foot ulcer: Secondary | ICD-10-CM | POA: Diagnosis not present

## 2017-04-27 DIAGNOSIS — E1151 Type 2 diabetes mellitus with diabetic peripheral angiopathy without gangrene: Secondary | ICD-10-CM | POA: Diagnosis not present

## 2017-04-27 DIAGNOSIS — M069 Rheumatoid arthritis, unspecified: Secondary | ICD-10-CM | POA: Diagnosis not present

## 2017-04-28 DIAGNOSIS — L97511 Non-pressure chronic ulcer of other part of right foot limited to breakdown of skin: Secondary | ICD-10-CM | POA: Diagnosis not present

## 2017-04-28 DIAGNOSIS — M069 Rheumatoid arthritis, unspecified: Secondary | ICD-10-CM | POA: Diagnosis not present

## 2017-04-28 DIAGNOSIS — I2510 Atherosclerotic heart disease of native coronary artery without angina pectoris: Secondary | ICD-10-CM | POA: Diagnosis not present

## 2017-04-28 DIAGNOSIS — E11621 Type 2 diabetes mellitus with foot ulcer: Secondary | ICD-10-CM | POA: Diagnosis not present

## 2017-04-28 DIAGNOSIS — I10 Essential (primary) hypertension: Secondary | ICD-10-CM | POA: Diagnosis not present

## 2017-04-28 DIAGNOSIS — E1151 Type 2 diabetes mellitus with diabetic peripheral angiopathy without gangrene: Secondary | ICD-10-CM | POA: Diagnosis not present

## 2017-04-29 ENCOUNTER — Telehealth: Payer: Self-pay | Admitting: *Deleted

## 2017-04-29 DIAGNOSIS — M069 Rheumatoid arthritis, unspecified: Secondary | ICD-10-CM | POA: Diagnosis not present

## 2017-04-29 DIAGNOSIS — I10 Essential (primary) hypertension: Secondary | ICD-10-CM | POA: Diagnosis not present

## 2017-04-29 DIAGNOSIS — E11621 Type 2 diabetes mellitus with foot ulcer: Secondary | ICD-10-CM | POA: Diagnosis not present

## 2017-04-29 DIAGNOSIS — E1151 Type 2 diabetes mellitus with diabetic peripheral angiopathy without gangrene: Secondary | ICD-10-CM | POA: Diagnosis not present

## 2017-04-29 DIAGNOSIS — I2510 Atherosclerotic heart disease of native coronary artery without angina pectoris: Secondary | ICD-10-CM | POA: Diagnosis not present

## 2017-04-29 DIAGNOSIS — L97511 Non-pressure chronic ulcer of other part of right foot limited to breakdown of skin: Secondary | ICD-10-CM | POA: Diagnosis not present

## 2017-04-29 NOTE — Telephone Encounter (Signed)
[name] - [facility] states needs clarification of wound care orders, should they continue the steri-strips and adaptic there is a small amount of drainage, no odor at this time.

## 2017-05-02 DIAGNOSIS — R251 Tremor, unspecified: Secondary | ICD-10-CM | POA: Diagnosis not present

## 2017-05-02 DIAGNOSIS — E039 Hypothyroidism, unspecified: Secondary | ICD-10-CM | POA: Diagnosis not present

## 2017-05-02 DIAGNOSIS — E875 Hyperkalemia: Secondary | ICD-10-CM | POA: Diagnosis not present

## 2017-05-02 DIAGNOSIS — N390 Urinary tract infection, site not specified: Secondary | ICD-10-CM | POA: Diagnosis not present

## 2017-05-02 DIAGNOSIS — M05142 Rheumatoid lung disease with rheumatoid arthritis of left hand: Secondary | ICD-10-CM | POA: Diagnosis present

## 2017-05-02 DIAGNOSIS — S80921D Unspecified superficial injury of right lower leg, subsequent encounter: Secondary | ICD-10-CM | POA: Diagnosis not present

## 2017-05-02 DIAGNOSIS — M549 Dorsalgia, unspecified: Secondary | ICD-10-CM | POA: Diagnosis present

## 2017-05-02 DIAGNOSIS — Z79891 Long term (current) use of opiate analgesic: Secondary | ICD-10-CM | POA: Diagnosis not present

## 2017-05-02 DIAGNOSIS — R404 Transient alteration of awareness: Secondary | ICD-10-CM | POA: Diagnosis not present

## 2017-05-02 DIAGNOSIS — E119 Type 2 diabetes mellitus without complications: Secondary | ICD-10-CM | POA: Diagnosis not present

## 2017-05-02 DIAGNOSIS — I110 Hypertensive heart disease with heart failure: Secondary | ICD-10-CM | POA: Diagnosis present

## 2017-05-02 DIAGNOSIS — R0602 Shortness of breath: Secondary | ICD-10-CM | POA: Diagnosis not present

## 2017-05-02 DIAGNOSIS — Z7952 Long term (current) use of systemic steroids: Secondary | ICD-10-CM | POA: Diagnosis not present

## 2017-05-02 DIAGNOSIS — M05141 Rheumatoid lung disease with rheumatoid arthritis of right hand: Secondary | ICD-10-CM | POA: Diagnosis present

## 2017-05-02 DIAGNOSIS — B965 Pseudomonas (aeruginosa) (mallei) (pseudomallei) as the cause of diseases classified elsewhere: Secondary | ICD-10-CM | POA: Diagnosis present

## 2017-05-02 DIAGNOSIS — H538 Other visual disturbances: Secondary | ICD-10-CM | POA: Diagnosis present

## 2017-05-02 DIAGNOSIS — E160 Drug-induced hypoglycemia without coma: Secondary | ICD-10-CM | POA: Diagnosis not present

## 2017-05-02 DIAGNOSIS — R531 Weakness: Secondary | ICD-10-CM | POA: Diagnosis present

## 2017-05-02 DIAGNOSIS — Z7982 Long term (current) use of aspirin: Secondary | ICD-10-CM | POA: Diagnosis not present

## 2017-05-02 DIAGNOSIS — E785 Hyperlipidemia, unspecified: Secondary | ICD-10-CM | POA: Diagnosis not present

## 2017-05-02 DIAGNOSIS — I69398 Other sequelae of cerebral infarction: Secondary | ICD-10-CM | POA: Diagnosis not present

## 2017-05-02 DIAGNOSIS — G8929 Other chronic pain: Secondary | ICD-10-CM | POA: Diagnosis present

## 2017-05-02 DIAGNOSIS — R0600 Dyspnea, unspecified: Secondary | ICD-10-CM | POA: Diagnosis not present

## 2017-05-02 DIAGNOSIS — I509 Heart failure, unspecified: Secondary | ICD-10-CM | POA: Diagnosis present

## 2017-05-02 DIAGNOSIS — M542 Cervicalgia: Secondary | ICD-10-CM | POA: Diagnosis present

## 2017-05-02 DIAGNOSIS — R42 Dizziness and giddiness: Secondary | ICD-10-CM | POA: Diagnosis not present

## 2017-05-02 DIAGNOSIS — J90 Pleural effusion, not elsewhere classified: Secondary | ICD-10-CM | POA: Diagnosis not present

## 2017-05-02 DIAGNOSIS — R05 Cough: Secondary | ICD-10-CM | POA: Diagnosis not present

## 2017-05-02 DIAGNOSIS — R918 Other nonspecific abnormal finding of lung field: Secondary | ICD-10-CM | POA: Diagnosis not present

## 2017-05-02 DIAGNOSIS — I160 Hypertensive urgency: Secondary | ICD-10-CM | POA: Diagnosis not present

## 2017-05-02 DIAGNOSIS — R7989 Other specified abnormal findings of blood chemistry: Secondary | ICD-10-CM | POA: Diagnosis present

## 2017-05-02 DIAGNOSIS — Z794 Long term (current) use of insulin: Secondary | ICD-10-CM | POA: Diagnosis not present

## 2017-05-02 DIAGNOSIS — K219 Gastro-esophageal reflux disease without esophagitis: Secondary | ICD-10-CM | POA: Diagnosis present

## 2017-05-02 DIAGNOSIS — I161 Hypertensive emergency: Secondary | ICD-10-CM | POA: Diagnosis not present

## 2017-05-03 NOTE — Telephone Encounter (Signed)
Cover area with PRISMA to absorb the small amount of drainage. No additional steristrips or adaptic is needed at this time. Thanks Dr. Kathie Rhodes

## 2017-05-04 NOTE — Telephone Encounter (Signed)
Faxed copy of Dr. Wynema Birch 011/05/2017 10:54pm orders to [facility].

## 2017-05-06 ENCOUNTER — Other Ambulatory Visit: Payer: Self-pay | Admitting: Sports Medicine

## 2017-05-06 DIAGNOSIS — I10 Essential (primary) hypertension: Secondary | ICD-10-CM | POA: Diagnosis not present

## 2017-05-06 DIAGNOSIS — N390 Urinary tract infection, site not specified: Secondary | ICD-10-CM | POA: Diagnosis not present

## 2017-05-06 DIAGNOSIS — I69311 Memory deficit following cerebral infarction: Secondary | ICD-10-CM | POA: Diagnosis not present

## 2017-05-06 DIAGNOSIS — L97512 Non-pressure chronic ulcer of other part of right foot with fat layer exposed: Secondary | ICD-10-CM | POA: Diagnosis not present

## 2017-05-06 DIAGNOSIS — L97511 Non-pressure chronic ulcer of other part of right foot limited to breakdown of skin: Secondary | ICD-10-CM

## 2017-05-06 DIAGNOSIS — I69312 Visuospatial deficit and spatial neglect following cerebral infarction: Secondary | ICD-10-CM | POA: Diagnosis not present

## 2017-05-06 DIAGNOSIS — E11621 Type 2 diabetes mellitus with foot ulcer: Secondary | ICD-10-CM | POA: Diagnosis not present

## 2017-05-06 MED ORDER — DOXYCYCLINE HYCLATE 100 MG PO TABS: 100.0000 mg | ORAL_TABLET | Freq: Two times a day (BID) | ORAL | 0 refills | Status: DC

## 2017-05-06 NOTE — Progress Notes (Signed)
Sent Doxycycline to pharmacy. Patient's home nurse called stating that patient was discharged from hospital on the last night and when she went out for wound care today has a new wound at the second and third toes with yellow drainage states that she has dressed it with foam dressing and was calling to get any further orders or instructions.  I advised home nurse to continue with foam dressing and to reevaluate the blistered areas on Monday when she comes out for another wound care visit I also sent to patient's pharmacy a refill of doxycycline since she will be completed with her Cipro antibiotic today. -Dr. Marylene Land

## 2017-05-09 ENCOUNTER — Telehealth: Payer: Self-pay | Admitting: *Deleted

## 2017-05-09 DIAGNOSIS — I10 Essential (primary) hypertension: Secondary | ICD-10-CM | POA: Diagnosis not present

## 2017-05-09 DIAGNOSIS — L97512 Non-pressure chronic ulcer of other part of right foot with fat layer exposed: Secondary | ICD-10-CM | POA: Diagnosis not present

## 2017-05-09 DIAGNOSIS — E11621 Type 2 diabetes mellitus with foot ulcer: Secondary | ICD-10-CM | POA: Diagnosis not present

## 2017-05-09 DIAGNOSIS — I69312 Visuospatial deficit and spatial neglect following cerebral infarction: Secondary | ICD-10-CM | POA: Diagnosis not present

## 2017-05-09 DIAGNOSIS — N390 Urinary tract infection, site not specified: Secondary | ICD-10-CM | POA: Diagnosis not present

## 2017-05-09 DIAGNOSIS — I69311 Memory deficit following cerebral infarction: Secondary | ICD-10-CM | POA: Diagnosis not present

## 2017-05-09 NOTE — Telephone Encounter (Signed)
I spoke with pt and she said she thinks she has a ride to the appt tomorrow with Dr. Samuella Cota.

## 2017-05-09 NOTE — Telephone Encounter (Signed)
I informed pt of Dr. Wynema Birch orders and she states she may be able to get a ride tomorrow, but her dtr is on crutches. I told pt Dr. Samuella Cota had an appt 05/10/2017 at 1:45pm. I instructed scheduler to put pt on the schedule per Dr. Marylene Land and pt will call to confirm.

## 2017-05-09 NOTE — Telephone Encounter (Signed)
[name] - [facility] states Dr/Stover is currently seeing pt for 2 wounds to right foot, but after hospitalization pt had callous with infected fluid and tunneling between left 2 and 3rd toes. Dr. Marylene Land started pt on an antibiotic and requested a call with pt's status on Monday, today although the blister looks better, the tunneling is completely around the 3rd left toe.

## 2017-05-09 NOTE — Telephone Encounter (Signed)
Continue with antibiotic. Lets see if Dr. Samuella Cota can check her if she can get to office sooner than 1/9. If she can not get to office make sure patient is aware if area worsens she needs return to ER or urgent care.   -Dr. Kathie Rhodes

## 2017-05-10 ENCOUNTER — Telehealth: Payer: Self-pay | Admitting: Sports Medicine

## 2017-05-10 ENCOUNTER — Ambulatory Visit (INDEPENDENT_AMBULATORY_CARE_PROVIDER_SITE_OTHER): Payer: Medicare Other

## 2017-05-10 ENCOUNTER — Ambulatory Visit (INDEPENDENT_AMBULATORY_CARE_PROVIDER_SITE_OTHER): Payer: Medicare Other | Admitting: Podiatry

## 2017-05-10 DIAGNOSIS — L97521 Non-pressure chronic ulcer of other part of left foot limited to breakdown of skin: Secondary | ICD-10-CM

## 2017-05-10 DIAGNOSIS — M79672 Pain in left foot: Secondary | ICD-10-CM

## 2017-05-10 NOTE — Telephone Encounter (Signed)
This is Sherlyn Lick, physical therapist with [facility]. Calling to get orders to see pt two times a week for four weeks then one time a week for one week. My number is [phone] and you are able to leave a message on there if you have to. Thank you.

## 2017-05-11 ENCOUNTER — Ambulatory Visit: Payer: Medicare Other | Admitting: Sports Medicine

## 2017-05-11 NOTE — Telephone Encounter (Signed)
Left message informing Sherlyn Lick, PT that Dr. Marylene Land agreed with his recommended PT schedule for pt.

## 2017-05-11 NOTE — Telephone Encounter (Signed)
I think PT should be find to continue -Dr. Kathie Rhodes

## 2017-05-12 DIAGNOSIS — E162 Hypoglycemia, unspecified: Secondary | ICD-10-CM | POA: Diagnosis not present

## 2017-05-12 DIAGNOSIS — I10 Essential (primary) hypertension: Secondary | ICD-10-CM | POA: Diagnosis not present

## 2017-05-12 DIAGNOSIS — N390 Urinary tract infection, site not specified: Secondary | ICD-10-CM | POA: Diagnosis not present

## 2017-05-12 DIAGNOSIS — I69312 Visuospatial deficit and spatial neglect following cerebral infarction: Secondary | ICD-10-CM | POA: Diagnosis not present

## 2017-05-12 DIAGNOSIS — E11621 Type 2 diabetes mellitus with foot ulcer: Secondary | ICD-10-CM | POA: Diagnosis not present

## 2017-05-12 DIAGNOSIS — E1165 Type 2 diabetes mellitus with hyperglycemia: Secondary | ICD-10-CM | POA: Diagnosis not present

## 2017-05-12 DIAGNOSIS — E1149 Type 2 diabetes mellitus with other diabetic neurological complication: Secondary | ICD-10-CM | POA: Diagnosis not present

## 2017-05-12 DIAGNOSIS — L97512 Non-pressure chronic ulcer of other part of right foot with fat layer exposed: Secondary | ICD-10-CM | POA: Diagnosis not present

## 2017-05-12 DIAGNOSIS — I69311 Memory deficit following cerebral infarction: Secondary | ICD-10-CM | POA: Diagnosis not present

## 2017-05-13 ENCOUNTER — Telehealth: Payer: Self-pay | Admitting: *Deleted

## 2017-05-13 ENCOUNTER — Ambulatory Visit (INDEPENDENT_AMBULATORY_CARE_PROVIDER_SITE_OTHER): Payer: Medicare Other | Admitting: Sports Medicine

## 2017-05-13 ENCOUNTER — Encounter: Payer: Self-pay | Admitting: Sports Medicine

## 2017-05-13 DIAGNOSIS — L97521 Non-pressure chronic ulcer of other part of left foot limited to breakdown of skin: Secondary | ICD-10-CM

## 2017-05-13 DIAGNOSIS — E1142 Type 2 diabetes mellitus with diabetic polyneuropathy: Secondary | ICD-10-CM | POA: Diagnosis not present

## 2017-05-13 DIAGNOSIS — L97512 Non-pressure chronic ulcer of other part of right foot with fat layer exposed: Secondary | ICD-10-CM | POA: Diagnosis not present

## 2017-05-13 DIAGNOSIS — I739 Peripheral vascular disease, unspecified: Secondary | ICD-10-CM | POA: Diagnosis not present

## 2017-05-13 DIAGNOSIS — M21969 Unspecified acquired deformity of unspecified lower leg: Secondary | ICD-10-CM

## 2017-05-13 NOTE — Telephone Encounter (Signed)
-----   Message from Asencion Islam, [place] sent at 05/13/2017  3:05 PM EST ----- Regarding: Home nursing  Medihoney to all wounds 3 times per week covered with dry dressing on both feet

## 2017-05-13 NOTE — Telephone Encounter (Signed)
Faxed orders to [facility]. 

## 2017-05-13 NOTE — Progress Notes (Signed)
Subjective: Reika Callanan is a 77 y.o. female patient seen in office for follow up evaluation of ulceration of the right 1st toe and now a new ulceration at the left third toe and bottom of the left foot,  Patient states that she had the ulcerations when she was in the hospital for Pseudomonas, currently on doxycycline saw Dr. Samuella Cota on Tuesday, denies nausea/fever/vomiting/chills/night sweats/shortness of breath/chest pain. Patient has no other pedal complaints at this time.  Patient Active Problem List   Diagnosis Date Noted  . Coronary artery disease 12/08/2010  . Dyspnea 12/08/2010  . HYPERTENSION, BENIGN 04/01/2010  . RHEUMATOID ARTHRITIS 04/01/2010  . ABNORMAL CV (STRESS) TEST 04/01/2010  . LEG PAIN, RIGHT 03/12/2010   Current Outpatient Medications on File Prior to Visit  Medication Sig Dispense Refill  . B-D ULTRAFINE III SHORT PEN 31G X 8 MM MISC     . cloNIDine (CATAPRES) 0.1 MG tablet Take 1 tablet by mouth Three times a day.    . diazepam (VALIUM) 5 MG tablet Take 5 mg by mouth 3 (three) times daily.      Marland Kitchen doxycycline (VIBRA-TABS) 100 MG tablet Take 1 tablet (100 mg total) by mouth 2 (two) times daily. 20 tablet 0  . ezetimibe-simvastatin (VYTORIN) 10-20 MG per tablet Take 1 tablet by mouth at bedtime.      Marland Kitchen FREESTYLE LITE test strip     . furosemide (LASIX) 40 MG tablet Take 40 mg by mouth daily.      . insulin NPH-insulin regular (NOVOLIN 70/30) (70-30) 100 UNIT/ML injection Inject 48 Units into the skin daily with breakfast. 10 units in p.m.     Marland Kitchen L-Methylfolate-B6-B12 (METANX) 2.8-25-2 MG TABS Take 1 tablet by mouth daily.      . lansoprazole (PREVACID) 30 MG capsule Take 30 mg by mouth daily.      Marland Kitchen levothyroxine (SYNTHROID, LEVOTHROID) 75 MCG tablet Take 75 mcg by mouth daily.      . metFORMIN (GLUCOPHAGE) 1000 MG tablet Take 500 mg by mouth 3 (three) times daily. Take 1/2 tab twice a day    . mupirocin ointment (BACTROBAN) 2 % To right toe ulcer 30 g 0  . oxyCODONE  (OXY IR/ROXICODONE) 5 MG immediate release tablet Take 5 mg by mouth 2 (two) times daily.      Marland Kitchen oxyCODONE (OXYCONTIN) 40 MG 12 hr tablet Take 40 mg by mouth every 8 (eight) hours.      . potassium chloride (KLOR-CON 10) 10 MEQ CR tablet Take 10 mEq by mouth 2 (two) times daily. When taking furosemide     . predniSONE (DELTASONE) 5 MG tablet Take 5 mg by mouth 2 (two) times daily.      Marland Kitchen triamcinolone (KENALOG) 0.1 % cream     . valsartan (DIOVAN) 320 MG tablet Take 1 tablet (320 mg total) by mouth daily. 30 tablet 1   No current facility-administered medications on file prior to visit.    Allergies  Allergen Reactions  . Gold Anaphylaxis    Swelling of lips/tongue, throat Swelling of lips/tongue, throat  . Nsaids Other (See Comments)    Intolerance Intolerance  . Sulfa Antibiotics Nausea Only  . Sulfonamide Derivatives   . Latex Other (See Comments)    blisters  . Sulfamethoxazole Nausea Only    No results found for this or any previous visit (from the past 2160 hour(s)).  Objective: There were no vitals filed for this visit.  General: Patient is awake, alert, oriented x 3 and  in no acute distress.  Dermatology: Skin is warm and dry bilateral with a partial thickness ulceration present right plantar hallux distal ulceration that measures 0.5cm x 0.3cm x 0.2cm (similar to previous) there is a keratotic border with a fibrogranular base. The ulceration does not probe to bone. There is no malodor, no active drainage, decreased erythema, decreased edema. No other acute signs of infection. To lateral right fifth metatarsal there is a ulceration that is partial thickness in nature at the site of previous abrasion that measures 1 x 1cm with a granular base there is no surrounding erythema or edema no other acute signs of infection.  To the dorsal midfoot there is an abrasion with a scab with no acute signs of infection.   To the left foot encompassing the third toe there is an ulceration  that measures 2 x 1 cm that has a granular base appears to be the bed of a previous blister with focal erythema but no other acute signs of infection to the plantar left forefoot there is a ulceration that measures 2 cm x 0.5 cm limited to breakdown of skin with a granular base and mildly keratotic margins with no acute signs of infection.  Vascular: Dorsalis Pedis pulse = 1/4 Bilateral,  Posterior Tibial pulse = 0/4 Bilateral,  Capillary Fill Time < 5 seconds. Chronic venous skin changes.  Neurologic: Protective sensation absent to the level of the ankles bilateral using  the 5.07/10g [employer].  Musculosketal: No Pain with palpation to ulcerated areas. No pain with compression to calves bilateral. Planus and digital deformity with rigid hallux extensus on right.   No results for input(s): GRAMSTAIN, LABORGA in the last 8760 hours.  Assessment and Plan:  Problem List Items Addressed This Visit    None    Visit Diagnoses    Ulcer of left foot, limited to breakdown of skin (HCC)    -  Primary   Toe ulcer, right, with fat layer exposed (HCC)       Diabetic polyneuropathy associated with type 2 diabetes mellitus (HCC)       PVD (peripheral vascular disease) (HCC)       Deformity of foot, unspecified laterality         -Examined patient and discussed the progression of the wound and treatment alternatives. - Excisionally dedbrided ulcerations bilateral to healthy bleeding borders removing nonviable tissue using a sterile chisel blade. Wound measure pre-and post debridement as above.  Wounds were debrided to the level of the dermis with viable wound base exposed to promote healing. Hemostasis was achieved with manuel pressure. Patient tolerated procedure well without any discomfort or anesthesia necessary for this wound debridement.  -Applied medi-honey to all sites bilateral and covered with dry sterile dressing and instructed patient to continue with daily dressings at home  consisting of the same with assistance from [facility] with wound care orders as follows Wound care 2x to 3x per week -May continue home PT -Continue doxycycline until culture results have come back -Continue with felt offloading padding to post op shoe or wide tennis shoe - Advised patient to go to the ER or return to office if the wound worsens or if constitutional symptoms are present. -Patient to return to office as scheduled in 2 weeks for follow up care/wound evaluation or sooner if problems arise.  Asencion Islam, DPM

## 2017-05-16 ENCOUNTER — Telehealth: Payer: Self-pay | Admitting: *Deleted

## 2017-05-16 DIAGNOSIS — M069 Rheumatoid arthritis, unspecified: Secondary | ICD-10-CM | POA: Diagnosis not present

## 2017-05-16 DIAGNOSIS — E11621 Type 2 diabetes mellitus with foot ulcer: Secondary | ICD-10-CM | POA: Diagnosis not present

## 2017-05-16 DIAGNOSIS — N390 Urinary tract infection, site not specified: Secondary | ICD-10-CM | POA: Diagnosis not present

## 2017-05-16 DIAGNOSIS — I69311 Memory deficit following cerebral infarction: Secondary | ICD-10-CM | POA: Diagnosis not present

## 2017-05-16 DIAGNOSIS — I10 Essential (primary) hypertension: Secondary | ICD-10-CM | POA: Diagnosis not present

## 2017-05-16 DIAGNOSIS — I69312 Visuospatial deficit and spatial neglect following cerebral infarction: Secondary | ICD-10-CM | POA: Diagnosis not present

## 2017-05-16 DIAGNOSIS — J90 Pleural effusion, not elsewhere classified: Secondary | ICD-10-CM | POA: Diagnosis not present

## 2017-05-16 DIAGNOSIS — M25512 Pain in left shoulder: Secondary | ICD-10-CM | POA: Diagnosis not present

## 2017-05-16 DIAGNOSIS — L97512 Non-pressure chronic ulcer of other part of right foot with fat layer exposed: Secondary | ICD-10-CM | POA: Diagnosis not present

## 2017-05-16 DIAGNOSIS — M19012 Primary osteoarthritis, left shoulder: Secondary | ICD-10-CM | POA: Diagnosis not present

## 2017-05-16 LAB — WOUND CULTURE

## 2017-05-16 NOTE — Telephone Encounter (Signed)
-----   Message from Asencion Islam, [place] sent at 05/13/2017  3:06 PM EST ----- Regarding: Epifix  Epifix not use at this visit please reach out to taught on process on how to return the graft.  The graft is currently sitting on the shelf in my office in [place]. Dr Kathie Rhodes

## 2017-05-16 NOTE — Telephone Encounter (Signed)
Emailed to T. Muncey - [employer] to pick up Graft in [place] office.

## 2017-05-17 DIAGNOSIS — N390 Urinary tract infection, site not specified: Secondary | ICD-10-CM | POA: Diagnosis not present

## 2017-05-17 DIAGNOSIS — L97512 Non-pressure chronic ulcer of other part of right foot with fat layer exposed: Secondary | ICD-10-CM | POA: Diagnosis not present

## 2017-05-17 DIAGNOSIS — E11621 Type 2 diabetes mellitus with foot ulcer: Secondary | ICD-10-CM | POA: Diagnosis not present

## 2017-05-17 DIAGNOSIS — I69312 Visuospatial deficit and spatial neglect following cerebral infarction: Secondary | ICD-10-CM | POA: Diagnosis not present

## 2017-05-17 DIAGNOSIS — I69311 Memory deficit following cerebral infarction: Secondary | ICD-10-CM | POA: Diagnosis not present

## 2017-05-17 DIAGNOSIS — I10 Essential (primary) hypertension: Secondary | ICD-10-CM | POA: Diagnosis not present

## 2017-05-18 DIAGNOSIS — I69311 Memory deficit following cerebral infarction: Secondary | ICD-10-CM | POA: Diagnosis not present

## 2017-05-18 DIAGNOSIS — N390 Urinary tract infection, site not specified: Secondary | ICD-10-CM | POA: Diagnosis not present

## 2017-05-18 DIAGNOSIS — I69312 Visuospatial deficit and spatial neglect following cerebral infarction: Secondary | ICD-10-CM | POA: Diagnosis not present

## 2017-05-18 DIAGNOSIS — E11621 Type 2 diabetes mellitus with foot ulcer: Secondary | ICD-10-CM | POA: Diagnosis not present

## 2017-05-18 DIAGNOSIS — L97512 Non-pressure chronic ulcer of other part of right foot with fat layer exposed: Secondary | ICD-10-CM | POA: Diagnosis not present

## 2017-05-18 DIAGNOSIS — I10 Essential (primary) hypertension: Secondary | ICD-10-CM | POA: Diagnosis not present

## 2017-05-19 DIAGNOSIS — I10 Essential (primary) hypertension: Secondary | ICD-10-CM | POA: Diagnosis not present

## 2017-05-19 DIAGNOSIS — L97512 Non-pressure chronic ulcer of other part of right foot with fat layer exposed: Secondary | ICD-10-CM | POA: Diagnosis not present

## 2017-05-19 DIAGNOSIS — I69312 Visuospatial deficit and spatial neglect following cerebral infarction: Secondary | ICD-10-CM | POA: Diagnosis not present

## 2017-05-19 DIAGNOSIS — N390 Urinary tract infection, site not specified: Secondary | ICD-10-CM | POA: Diagnosis not present

## 2017-05-19 DIAGNOSIS — E11621 Type 2 diabetes mellitus with foot ulcer: Secondary | ICD-10-CM | POA: Diagnosis not present

## 2017-05-19 DIAGNOSIS — I69311 Memory deficit following cerebral infarction: Secondary | ICD-10-CM | POA: Diagnosis not present

## 2017-05-19 DIAGNOSIS — I63432 Cerebral infarction due to embolism of left posterior cerebral artery: Secondary | ICD-10-CM | POA: Diagnosis not present

## 2017-05-20 DIAGNOSIS — N390 Urinary tract infection, site not specified: Secondary | ICD-10-CM | POA: Diagnosis not present

## 2017-05-20 DIAGNOSIS — E11621 Type 2 diabetes mellitus with foot ulcer: Secondary | ICD-10-CM | POA: Diagnosis not present

## 2017-05-20 DIAGNOSIS — I69312 Visuospatial deficit and spatial neglect following cerebral infarction: Secondary | ICD-10-CM | POA: Diagnosis not present

## 2017-05-20 DIAGNOSIS — I69311 Memory deficit following cerebral infarction: Secondary | ICD-10-CM | POA: Diagnosis not present

## 2017-05-20 DIAGNOSIS — I10 Essential (primary) hypertension: Secondary | ICD-10-CM | POA: Diagnosis not present

## 2017-05-20 DIAGNOSIS — L97512 Non-pressure chronic ulcer of other part of right foot with fat layer exposed: Secondary | ICD-10-CM | POA: Diagnosis not present

## 2017-05-23 DIAGNOSIS — I69312 Visuospatial deficit and spatial neglect following cerebral infarction: Secondary | ICD-10-CM | POA: Diagnosis not present

## 2017-05-23 DIAGNOSIS — N390 Urinary tract infection, site not specified: Secondary | ICD-10-CM | POA: Diagnosis not present

## 2017-05-23 DIAGNOSIS — I10 Essential (primary) hypertension: Secondary | ICD-10-CM | POA: Diagnosis not present

## 2017-05-23 DIAGNOSIS — E11621 Type 2 diabetes mellitus with foot ulcer: Secondary | ICD-10-CM | POA: Diagnosis not present

## 2017-05-23 DIAGNOSIS — L97512 Non-pressure chronic ulcer of other part of right foot with fat layer exposed: Secondary | ICD-10-CM | POA: Diagnosis not present

## 2017-05-23 DIAGNOSIS — I69311 Memory deficit following cerebral infarction: Secondary | ICD-10-CM | POA: Diagnosis not present

## 2017-05-24 DIAGNOSIS — S40012S Contusion of left shoulder, sequela: Secondary | ICD-10-CM | POA: Diagnosis not present

## 2017-05-25 DIAGNOSIS — E11621 Type 2 diabetes mellitus with foot ulcer: Secondary | ICD-10-CM | POA: Diagnosis not present

## 2017-05-25 DIAGNOSIS — I69311 Memory deficit following cerebral infarction: Secondary | ICD-10-CM | POA: Diagnosis not present

## 2017-05-25 DIAGNOSIS — N390 Urinary tract infection, site not specified: Secondary | ICD-10-CM | POA: Diagnosis not present

## 2017-05-25 DIAGNOSIS — I69312 Visuospatial deficit and spatial neglect following cerebral infarction: Secondary | ICD-10-CM | POA: Diagnosis not present

## 2017-05-25 DIAGNOSIS — I10 Essential (primary) hypertension: Secondary | ICD-10-CM | POA: Diagnosis not present

## 2017-05-25 DIAGNOSIS — L97512 Non-pressure chronic ulcer of other part of right foot with fat layer exposed: Secondary | ICD-10-CM | POA: Diagnosis not present

## 2017-05-26 ENCOUNTER — Telehealth: Payer: Self-pay

## 2017-05-26 DIAGNOSIS — N390 Urinary tract infection, site not specified: Secondary | ICD-10-CM | POA: Diagnosis not present

## 2017-05-26 DIAGNOSIS — I69311 Memory deficit following cerebral infarction: Secondary | ICD-10-CM | POA: Diagnosis not present

## 2017-05-26 DIAGNOSIS — I69312 Visuospatial deficit and spatial neglect following cerebral infarction: Secondary | ICD-10-CM | POA: Diagnosis not present

## 2017-05-26 DIAGNOSIS — L97512 Non-pressure chronic ulcer of other part of right foot with fat layer exposed: Secondary | ICD-10-CM | POA: Diagnosis not present

## 2017-05-26 DIAGNOSIS — E11621 Type 2 diabetes mellitus with foot ulcer: Secondary | ICD-10-CM | POA: Diagnosis not present

## 2017-05-26 DIAGNOSIS — I10 Essential (primary) hypertension: Secondary | ICD-10-CM | POA: Diagnosis not present

## 2017-05-26 NOTE — Telephone Encounter (Signed)
SENT REFERRAL TO SCHEDULING 

## 2017-05-27 ENCOUNTER — Ambulatory Visit (INDEPENDENT_AMBULATORY_CARE_PROVIDER_SITE_OTHER): Payer: Medicare Other | Admitting: Sports Medicine

## 2017-05-27 ENCOUNTER — Encounter: Payer: Self-pay | Admitting: Sports Medicine

## 2017-05-27 DIAGNOSIS — E1142 Type 2 diabetes mellitus with diabetic polyneuropathy: Secondary | ICD-10-CM

## 2017-05-27 DIAGNOSIS — M21969 Unspecified acquired deformity of unspecified lower leg: Secondary | ICD-10-CM

## 2017-05-27 DIAGNOSIS — L97521 Non-pressure chronic ulcer of other part of left foot limited to breakdown of skin: Secondary | ICD-10-CM

## 2017-05-27 DIAGNOSIS — I739 Peripheral vascular disease, unspecified: Secondary | ICD-10-CM

## 2017-05-27 DIAGNOSIS — L97512 Non-pressure chronic ulcer of other part of right foot with fat layer exposed: Secondary | ICD-10-CM

## 2017-05-27 NOTE — Progress Notes (Signed)
Subjective: [name] is a 77 y.o. female patient seen in office for follow up evaluation of ulceration of the right 1st toe and side and top of foot and ulceration at the left third toe and bottom of the left foot,  Patient states that her back hurts from a recent fall.  Denies nausea/fever/vomiting/chills/night sweats/shortness of breath/chest pain. Patient has no other pedal complaints at this time.  Patient Active Problem List   Diagnosis Date Noted  . Coronary artery disease 12/08/2010  . Dyspnea 12/08/2010  . HYPERTENSION, BENIGN 04/01/2010  . RHEUMATOID ARTHRITIS 04/01/2010  . ABNORMAL CV (STRESS) TEST 04/01/2010  . LEG PAIN, RIGHT 03/12/2010   Current Outpatient Medications on File Prior to Visit  Medication Sig Dispense Refill  . B-D ULTRAFINE III SHORT PEN 31G X 8 MM MISC     . cloNIDine (CATAPRES) 0.1 MG tablet Take 1 tablet by mouth Three times a day.    . diazepam (VALIUM) 5 MG tablet Take 5 mg by mouth 3 (three) times daily.      Marland Kitchen doxycycline (VIBRA-TABS) 100 MG tablet Take 1 tablet (100 mg total) by mouth 2 (two) times daily. 20 tablet 0  . ezetimibe-simvastatin (VYTORIN) 10-20 MG per tablet Take 1 tablet by mouth at bedtime.      Marland Kitchen FREESTYLE LITE test strip     . furosemide (LASIX) 40 MG tablet Take 40 mg by mouth daily.      . insulin NPH-insulin regular (NOVOLIN 70/30) (70-30) 100 UNIT/ML injection Inject 48 Units into the skin daily with breakfast. 10 units in p.m.     Marland Kitchen L-Methylfolate-B6-B12 (METANX) 2.8-25-2 MG TABS Take 1 tablet by mouth daily.      . lansoprazole (PREVACID) 30 MG capsule Take 30 mg by mouth daily.      Marland Kitchen levothyroxine (SYNTHROID, LEVOTHROID) 75 MCG tablet Take 75 mcg by mouth daily.      . metFORMIN (GLUCOPHAGE) 1000 MG tablet Take 500 mg by mouth 3 (three) times daily. Take 1/2 tab twice a day    . mupirocin ointment (BACTROBAN) 2 % To right toe ulcer 30 g 0  . oxyCODONE (OXY IR/ROXICODONE) 5 MG immediate release tablet Take 5 mg by mouth 2  (two) times daily.      Marland Kitchen oxyCODONE (OXYCONTIN) 40 MG 12 hr tablet Take 40 mg by mouth every 8 (eight) hours.      . potassium chloride (KLOR-CON 10) 10 MEQ CR tablet Take 10 mEq by mouth 2 (two) times daily. When taking furosemide     . predniSONE (DELTASONE) 5 MG tablet Take 5 mg by mouth 2 (two) times daily.      Marland Kitchen triamcinolone (KENALOG) 0.1 % cream     . valsartan (DIOVAN) 320 MG tablet Take 1 tablet (320 mg total) by mouth daily. 30 tablet 1   No current facility-administered medications on file prior to visit.    Allergies  Allergen Reactions  . Gold Anaphylaxis    Swelling of lips/tongue, throat Swelling of lips/tongue, throat  . Nsaids Other (See Comments)    Intolerance Intolerance  . Sulfa Antibiotics Nausea Only  . Sulfonamide Derivatives   . Latex Other (See Comments)    blisters  . Sulfamethoxazole Nausea Only    Recent Results (from the past 2160 hour(s))  WOUND CULTURE     Status: None   Collection Time: 05/10/17  3:15 PM  Result Value Ref Range   Gram Stain Result CANCELED     Comment: The specimen submitted  does not meet the laboratory's criteria for acceptability. Refer to [facility] for specimen acceptability criteria.  Result canceled by the ancillary.    Aerobic Bacterial Culture Final report    Organism ID, Bacteria Mixed skin flora     Comment: Heavy growth    Objective: There were no vitals filed for this visit.  General: Patient is awake, alert, oriented x 3 and in no acute distress.  Dermatology: Skin is warm and dry bilateral with a partial thickness ulceration present right plantar hallux distal ulceration that measures 0.3cm x 0.3cm x 0.2cm (slightly smaller than previous) there is a keratotic border with a fibrogranular base. The ulceration does not probe to bone. There is no malodor, no active drainage, decreased erythema, decreased edema. No other acute signs of infection.   To lateral right fifth metatarsal there is  a ulceration that is partial thickness in nature at the site of previous abrasion that measures 0.4x0.2cm (smaller than previous) with a granular base there is no surrounding erythema or edema no other acute signs of infection.    To the right dorsal midfoot there is an abrasion with a scab and once cleansed revealed a partial thickness ulceration with a fibrogranular base that measures 0.3x0.2cm with no acute signs of infection.   To the left foot at the third toe there is an ulceration that measures 0.4x0.3cm (smaller than previous) that has a granular base appears to be the bed of a previous blister with focal erythema but no other acute signs of infection.  To the plantar left forefoot there is a ulceration that measures 1.8 cm x 1cm (larger than previous) limited to breakdown of skin with a granular base and mildly keratotic margins with no acute signs of infection.  Vascular: Dorsalis Pedis pulse = 1/4 Bilateral,  Posterior Tibial pulse = 0/4 Bilateral,  Capillary Fill Time < 5 seconds. Chronic venous skin changes.  Neurologic: Protective sensation absent to the level of the ankles bilateral using  the 5.07/10g [employer].  Musculosketal: No Pain with palpation to ulcerated areas. No pain with compression to calves bilateral. Planus and digital deformity with rigid hallux extensus on right.   No results for input(s): GRAMSTAIN, LABORGA in the last 8760 hours.  Assessment and Plan:  Problem List Items Addressed This Visit    None    Visit Diagnoses    Ulcer of left foot, limited to breakdown of skin (HCC)    -  Primary   Toe ulcer, right, with fat layer exposed (HCC)       Diabetic polyneuropathy associated with type 2 diabetes mellitus (HCC)       PVD (peripheral vascular disease) (HCC)       Deformity of foot, unspecified laterality         -Examined patient and discussed the progression of the wound and treatment alternatives. - Excisionally dedbrided  ulcerations bilateral to healthy bleeding borders removing nonviable tissue using a sterile chisel blade. Wound measure post debridement as above.  Wounds were debrided to the level of the dermis with viable wound base exposed to promote healing. Hemostasis was achieved with manuel pressure. Patient tolerated procedure well without any discomfort or anesthesia necessary for this wound debridement.  -Applied medi-honey to all sites bilateral and covered with dry sterile dressing and instructed patient to continue with daily dressings at home consisting of the same with assistance from [facility] with wound care orders as follows of Medihoney to the wounds 2x to 3x per  week -May continue home PT to tolerance for gait and stability training  -Continue with felt offloading padding to post op shoe or wide tennis shoe -Advised patient to go to the ER or return to office if the wound worsens or if constitutional symptoms are present. -Patient to return to office as scheduled in 2-3 weeks for follow up care/wound evaluation or sooner if problems arise.  Asencion Islam, DPM

## 2017-05-29 DIAGNOSIS — M545 Low back pain: Secondary | ICD-10-CM | POA: Diagnosis not present

## 2017-05-29 DIAGNOSIS — M5430 Sciatica, unspecified side: Secondary | ICD-10-CM | POA: Diagnosis not present

## 2017-05-30 ENCOUNTER — Telehealth: Payer: Self-pay | Admitting: *Deleted

## 2017-05-30 DIAGNOSIS — I10 Essential (primary) hypertension: Secondary | ICD-10-CM | POA: Diagnosis not present

## 2017-05-30 DIAGNOSIS — I69312 Visuospatial deficit and spatial neglect following cerebral infarction: Secondary | ICD-10-CM | POA: Diagnosis not present

## 2017-05-30 DIAGNOSIS — L97512 Non-pressure chronic ulcer of other part of right foot with fat layer exposed: Secondary | ICD-10-CM | POA: Diagnosis not present

## 2017-05-30 DIAGNOSIS — N390 Urinary tract infection, site not specified: Secondary | ICD-10-CM | POA: Diagnosis not present

## 2017-05-30 DIAGNOSIS — E11621 Type 2 diabetes mellitus with foot ulcer: Secondary | ICD-10-CM | POA: Diagnosis not present

## 2017-05-30 DIAGNOSIS — I69311 Memory deficit following cerebral infarction: Secondary | ICD-10-CM | POA: Diagnosis not present

## 2017-05-30 NOTE — Telephone Encounter (Signed)
-----   Message from Asencion Islam, [place] sent at 05/27/2017  4:57 PM EST ----- Regarding: Wound care orders Medihoney to all ulcerations bilateral covered with dry dressing 2-3x per week -Dr. Marylene Land

## 2017-05-30 NOTE — Telephone Encounter (Addendum)
Faxed orders of 05/27/2017 4:57pm to [facility].

## 2017-06-01 DIAGNOSIS — L97512 Non-pressure chronic ulcer of other part of right foot with fat layer exposed: Secondary | ICD-10-CM | POA: Diagnosis not present

## 2017-06-01 DIAGNOSIS — E11621 Type 2 diabetes mellitus with foot ulcer: Secondary | ICD-10-CM | POA: Diagnosis not present

## 2017-06-01 DIAGNOSIS — H25812 Combined forms of age-related cataract, left eye: Secondary | ICD-10-CM | POA: Diagnosis not present

## 2017-06-01 DIAGNOSIS — I69312 Visuospatial deficit and spatial neglect following cerebral infarction: Secondary | ICD-10-CM | POA: Diagnosis not present

## 2017-06-01 DIAGNOSIS — E119 Type 2 diabetes mellitus without complications: Secondary | ICD-10-CM | POA: Diagnosis not present

## 2017-06-01 DIAGNOSIS — N390 Urinary tract infection, site not specified: Secondary | ICD-10-CM | POA: Diagnosis not present

## 2017-06-01 DIAGNOSIS — H26491 Other secondary cataract, right eye: Secondary | ICD-10-CM | POA: Diagnosis not present

## 2017-06-01 DIAGNOSIS — I69311 Memory deficit following cerebral infarction: Secondary | ICD-10-CM | POA: Diagnosis not present

## 2017-06-01 DIAGNOSIS — I10 Essential (primary) hypertension: Secondary | ICD-10-CM | POA: Diagnosis not present

## 2017-06-02 DIAGNOSIS — I10 Essential (primary) hypertension: Secondary | ICD-10-CM | POA: Diagnosis not present

## 2017-06-02 DIAGNOSIS — E11621 Type 2 diabetes mellitus with foot ulcer: Secondary | ICD-10-CM | POA: Diagnosis not present

## 2017-06-02 DIAGNOSIS — L97512 Non-pressure chronic ulcer of other part of right foot with fat layer exposed: Secondary | ICD-10-CM | POA: Diagnosis not present

## 2017-06-02 DIAGNOSIS — N390 Urinary tract infection, site not specified: Secondary | ICD-10-CM | POA: Diagnosis not present

## 2017-06-02 DIAGNOSIS — I69311 Memory deficit following cerebral infarction: Secondary | ICD-10-CM | POA: Diagnosis not present

## 2017-06-02 DIAGNOSIS — I69312 Visuospatial deficit and spatial neglect following cerebral infarction: Secondary | ICD-10-CM | POA: Diagnosis not present

## 2017-06-03 ENCOUNTER — Encounter: Payer: Self-pay | Admitting: *Deleted

## 2017-06-03 DIAGNOSIS — E11621 Type 2 diabetes mellitus with foot ulcer: Secondary | ICD-10-CM | POA: Diagnosis not present

## 2017-06-03 DIAGNOSIS — N390 Urinary tract infection, site not specified: Secondary | ICD-10-CM | POA: Diagnosis not present

## 2017-06-03 DIAGNOSIS — L97512 Non-pressure chronic ulcer of other part of right foot with fat layer exposed: Secondary | ICD-10-CM | POA: Diagnosis not present

## 2017-06-03 DIAGNOSIS — I10 Essential (primary) hypertension: Secondary | ICD-10-CM | POA: Diagnosis not present

## 2017-06-03 DIAGNOSIS — I69311 Memory deficit following cerebral infarction: Secondary | ICD-10-CM | POA: Diagnosis not present

## 2017-06-03 DIAGNOSIS — I69312 Visuospatial deficit and spatial neglect following cerebral infarction: Secondary | ICD-10-CM | POA: Diagnosis not present

## 2017-06-06 DIAGNOSIS — I69312 Visuospatial deficit and spatial neglect following cerebral infarction: Secondary | ICD-10-CM | POA: Diagnosis not present

## 2017-06-06 DIAGNOSIS — N390 Urinary tract infection, site not specified: Secondary | ICD-10-CM | POA: Diagnosis not present

## 2017-06-06 DIAGNOSIS — L97512 Non-pressure chronic ulcer of other part of right foot with fat layer exposed: Secondary | ICD-10-CM | POA: Diagnosis not present

## 2017-06-06 DIAGNOSIS — E11621 Type 2 diabetes mellitus with foot ulcer: Secondary | ICD-10-CM | POA: Diagnosis not present

## 2017-06-06 DIAGNOSIS — I10 Essential (primary) hypertension: Secondary | ICD-10-CM | POA: Diagnosis not present

## 2017-06-06 DIAGNOSIS — I69311 Memory deficit following cerebral infarction: Secondary | ICD-10-CM | POA: Diagnosis not present

## 2017-06-08 DIAGNOSIS — Z6826 Body mass index (BMI) 26.0-26.9, adult: Secondary | ICD-10-CM | POA: Diagnosis not present

## 2017-06-08 DIAGNOSIS — I69312 Visuospatial deficit and spatial neglect following cerebral infarction: Secondary | ICD-10-CM | POA: Diagnosis not present

## 2017-06-08 DIAGNOSIS — L97512 Non-pressure chronic ulcer of other part of right foot with fat layer exposed: Secondary | ICD-10-CM | POA: Diagnosis not present

## 2017-06-08 DIAGNOSIS — I10 Essential (primary) hypertension: Secondary | ICD-10-CM | POA: Diagnosis not present

## 2017-06-08 DIAGNOSIS — N3000 Acute cystitis without hematuria: Secondary | ICD-10-CM | POA: Diagnosis not present

## 2017-06-08 DIAGNOSIS — E11621 Type 2 diabetes mellitus with foot ulcer: Secondary | ICD-10-CM | POA: Diagnosis not present

## 2017-06-08 DIAGNOSIS — I69311 Memory deficit following cerebral infarction: Secondary | ICD-10-CM | POA: Diagnosis not present

## 2017-06-08 DIAGNOSIS — N390 Urinary tract infection, site not specified: Secondary | ICD-10-CM | POA: Diagnosis not present

## 2017-06-08 DIAGNOSIS — M5440 Lumbago with sciatica, unspecified side: Secondary | ICD-10-CM | POA: Diagnosis not present

## 2017-06-09 ENCOUNTER — Telehealth: Payer: Self-pay | Admitting: Sports Medicine

## 2017-06-09 DIAGNOSIS — L97512 Non-pressure chronic ulcer of other part of right foot with fat layer exposed: Secondary | ICD-10-CM | POA: Diagnosis not present

## 2017-06-09 DIAGNOSIS — N390 Urinary tract infection, site not specified: Secondary | ICD-10-CM | POA: Diagnosis not present

## 2017-06-09 DIAGNOSIS — E11621 Type 2 diabetes mellitus with foot ulcer: Secondary | ICD-10-CM | POA: Diagnosis not present

## 2017-06-09 DIAGNOSIS — I69312 Visuospatial deficit and spatial neglect following cerebral infarction: Secondary | ICD-10-CM | POA: Diagnosis not present

## 2017-06-09 DIAGNOSIS — I69311 Memory deficit following cerebral infarction: Secondary | ICD-10-CM | POA: Diagnosis not present

## 2017-06-09 DIAGNOSIS — I10 Essential (primary) hypertension: Secondary | ICD-10-CM | POA: Diagnosis not present

## 2017-06-09 NOTE — Telephone Encounter (Signed)
Hello this is Sherlyn Lick, physical therapist with [facility]. I was calling to see if we can get verbal okay to extend her home health physical therapy two times a week for two more weeks. You can leave a message on my voicemail. My number is [phone]. Thank you.

## 2017-06-09 NOTE — Telephone Encounter (Signed)
Dr. Marylene Land states okay to continue PT as suggested by Sherlyn Lick, PT. I informed Ladene Artist, PT of Dr. Wynema Birch orders.

## 2017-06-10 DIAGNOSIS — N390 Urinary tract infection, site not specified: Secondary | ICD-10-CM | POA: Diagnosis not present

## 2017-06-10 DIAGNOSIS — I10 Essential (primary) hypertension: Secondary | ICD-10-CM | POA: Diagnosis not present

## 2017-06-10 DIAGNOSIS — I69311 Memory deficit following cerebral infarction: Secondary | ICD-10-CM | POA: Diagnosis not present

## 2017-06-10 DIAGNOSIS — E11621 Type 2 diabetes mellitus with foot ulcer: Secondary | ICD-10-CM | POA: Diagnosis not present

## 2017-06-10 DIAGNOSIS — I69312 Visuospatial deficit and spatial neglect following cerebral infarction: Secondary | ICD-10-CM | POA: Diagnosis not present

## 2017-06-10 DIAGNOSIS — L97512 Non-pressure chronic ulcer of other part of right foot with fat layer exposed: Secondary | ICD-10-CM | POA: Diagnosis not present

## 2017-06-13 ENCOUNTER — Ambulatory Visit (INDEPENDENT_AMBULATORY_CARE_PROVIDER_SITE_OTHER): Payer: Medicare Other | Admitting: Internal Medicine

## 2017-06-13 ENCOUNTER — Encounter: Payer: Self-pay | Admitting: Internal Medicine

## 2017-06-13 VITALS — BP 158/70 | HR 81 | Ht 59.0 in | Wt 128.8 lb

## 2017-06-13 DIAGNOSIS — I69311 Memory deficit following cerebral infarction: Secondary | ICD-10-CM | POA: Diagnosis not present

## 2017-06-13 DIAGNOSIS — I10 Essential (primary) hypertension: Secondary | ICD-10-CM | POA: Diagnosis not present

## 2017-06-13 DIAGNOSIS — L97512 Non-pressure chronic ulcer of other part of right foot with fat layer exposed: Secondary | ICD-10-CM | POA: Diagnosis not present

## 2017-06-13 DIAGNOSIS — I440 Atrioventricular block, first degree: Secondary | ICD-10-CM | POA: Diagnosis not present

## 2017-06-13 DIAGNOSIS — N390 Urinary tract infection, site not specified: Secondary | ICD-10-CM | POA: Diagnosis not present

## 2017-06-13 DIAGNOSIS — E11621 Type 2 diabetes mellitus with foot ulcer: Secondary | ICD-10-CM | POA: Diagnosis not present

## 2017-06-13 DIAGNOSIS — I69312 Visuospatial deficit and spatial neglect following cerebral infarction: Secondary | ICD-10-CM | POA: Diagnosis not present

## 2017-06-13 MED ORDER — AMLODIPINE BESYLATE 2.5 MG PO TABS: 2.5000 mg | ORAL_TABLET | Freq: Every day | ORAL | 3 refills | Status: DC

## 2017-06-13 NOTE — Progress Notes (Signed)
ELECTROPHYSIOLOGY CONSULT NOTE  Patient ID: [name], MRN: [id number], DOB/AGE: 76-Aug-1942 76 y.o. Admit date: (Not on file) Date of Consult: 06/13/2017  Primary Physician: Heywood Bene, MD Primary Cardiologist: new     [name] is a 77 y.o. female who is being seen today for the evaluation of abnormal monitor at the request of Dr Lorin Picket.    HPI [name] is a 77 y.o. female  Referred because of a monitor showing atrial fibrillation with a history of a stroke in 12/18 which left her with a visual defect.  She had a CT scan that was apparently abnormal identifying an old stroke but apparently specifically a nonbleeding stroke  She has severe and poorly controlled hypertension having been hospitalized later 12/18 for hypertensive crisis.  She was discharged on a combination of medications including the intercurrent addition of clonidine and amlodipine.  She began the former but not the latter.  Outside hospital records were reviewed notable for a large right pleural effusion.  Echocardiogram 12/18 EF 55-60% with concentric LVH.  She has severe rheumatoid arthritis and has gone through "every medicine on the market "with worsening symptoms.  DATE TEST EF   5/12 Echo   57 % Ow normal  12/18  Echo  55-65%  LVH concentric         Thromboembolic risk factors ( age -15, HTN-1, TIA/CVA-2, DM-1, Vasc disease -1, CHF-1  Gender-1) for a CHADSVASc Score of 9    Past Medical History:  Diagnosis Date  . HTN (hypertension)   . Hyperlipidemia   . Hypothyroidism   . Insulin dependent diabetes mellitus (HCC)   . Obesity       Surgical History:  Past Surgical History:  Procedure Laterality Date  . abnormal lexiscan     at [facility] on [facility] 20,2011. demonstrating small area of ischemia in the midportion of the anterior septal wall with preserved left ventricular ejection fraction  . APPENDECTOMY    . Biteral knee surgery    . CHOLECYSTECTOMY    .  EYE SURGERY     as a child  . HAND SURGERY     Right  . LUNG SURGERY     x 2  . Nodule removal    . WRIST SURGERY       Home Meds: Prior to Admission medications   Medication Sig Start Date End Date Taking? Authorizing Provider  B-D ULTRAFINE III SHORT PEN 31G X 8 MM MISC  12/23/10   [provider]  cloNIDine (CATAPRES) 0.1 MG tablet Take 1 tablet by mouth Three times a day. 01/19/12   [provider]  diazepam (VALIUM) 5 MG tablet Take 5 mg by mouth 3 (three) times daily.      [provider]  doxycycline (VIBRA-TABS) 100 MG tablet Take 1 tablet (100 mg total) by mouth 2 (two) times daily. 05/06/17   Asencion Islam, DPM  ezetimibe-simvastatin (VYTORIN) 10-20 MG per tablet Take 1 tablet by mouth at bedtime.      [provider]  FREESTYLE LITE test strip  12/14/10   [provider]  furosemide (LASIX) 40 MG tablet Take 40 mg by mouth daily.      [provider]  insulin NPH-insulin regular (NOVOLIN 70/30) (70-30) 100 UNIT/ML injection Inject 48 Units into the skin daily with breakfast. 10 units in p.m.     [provider]  L-Methylfolate-B6-B12 (METANX) 2.8-25-2 MG TABS Take 1 tablet by mouth daily.  [provider]  lansoprazole (PREVACID) 30 MG capsule Take 30 mg by mouth daily.      [provider]  levothyroxine (SYNTHROID, LEVOTHROID) 75 MCG tablet Take 75 mcg by mouth daily.      [provider]  metFORMIN (GLUCOPHAGE) 1000 MG tablet Take 500 mg by mouth 3 (three) times daily. Take 1/2 tab twice a day    [provider]  mupirocin ointment (BACTROBAN) 2 % To right toe ulcer 12/17/16   Asencion Islam, DPM  oxyCODONE (OXY IR/ROXICODONE) 5 MG immediate release tablet Take 5 mg by mouth 2 (two) times daily.      [provider]  oxyCODONE (OXYCONTIN) 40 MG 12 hr tablet Take 40 mg by mouth every 8 (eight) hours.      [provider]  potassium chloride (KLOR-CON 10) 10  MEQ CR tablet Take 10 mEq by mouth 2 (two) times daily. When taking furosemide     [provider]  predniSONE (DELTASONE) 5 MG tablet Take 5 mg by mouth 2 (two) times daily.      [provider]  triamcinolone (KENALOG) 0.1 % cream  01/06/11   [provider]  valsartan (DIOVAN) 320 MG tablet Take 1 tablet (320 mg total) by mouth daily. 01/20/12   Herby Abraham, MD    Allergies:  Allergies  Allergen Reactions  . Gold Anaphylaxis    Swelling of lips/tongue, throat Swelling of lips/tongue, throat  . Nsaids Other (See Comments)    Intolerance Intolerance  . Sulfa Antibiotics Nausea Only  . Sulfonamide Derivatives   . Latex Other (See Comments)    blisters  . Sulfamethoxazole Nausea Only    Social History   Socioeconomic History  . Marital status: Married    Spouse name: Not on file  . Number of children: 2  . Years of education: Not on file  . Highest education level: Not on file  Social Needs  . Financial resource strain: Not on file  . Food insecurity - worry: Not on file  . Food insecurity - inability: Not on file  . Transportation needs - medical: Not on file  . Transportation needs - non-medical: Not on file  Occupational History  . Not on file  Tobacco Use  . Smoking status: Never Smoker  . Smokeless tobacco: Never Used  Substance and Sexual Activity  . Alcohol use: No  . Drug use: No  . Sexual activity: Not on file  Other Topics Concern  . Not on file  Social History Narrative   Lives in [place] with her husband.     Family History  Problem Relation Age of Onset  . Lymphoma Mother   . Stroke Mother        questionable  . Heart attack Father 41     ROS:  Please see the history of present illness.     All other systems reviewed and negative.    Physical Exam:  Blood pressure (!) 224/100, pulse 81, height 4\' 11"  (1.499 m), weight 128 lb 12.8 oz (58.4 kg). General: Well developed, cachectic female in no acute  distress. Head: Normocephalic, atraumatic, sclera non-icteric, no xanthomas, nares are without discharge. EENT: normal  Lymph Nodes:  none Neck: Negative for carotid bruits. JVD not elevated. Back:without   kyphosis  Lungs: Clear bilaterally to auscultation without wheezes, rales, or rhonchi. Breathing is unlabored. Heart: RRR with S1 S2. 2/6 systolic murmur . No rubs, or gallops appreciated. Abdomen: Soft, non-tender, non-distended with normoactive bowel  sounds. No hepatomegaly. No rebound/guarding. No obvious abdominal masses. Msk: Significant arthritic deformation Extremities: No clubbing or cyanosis. No edema.  Distal pedal pulses are 2+ and equal bilaterally. Skin: Warm and Dry Neuro: Alert and oriented X 3. CN III-XII intact Grossly normal sensory and motor function . Psych:  Responds to questions appropriately with a normal affect.      Labs: Cardiac Enzymes No results for input(s): CKTOTAL, CKMB, TROPONINI in the last 72 hours. CBC Lab Results  Component Value Date   WBC 11.8 (H) 03/05/2010   HGB 13.6 04/08/2010   HCT 40.0 04/08/2010   MCV 86.5 03/05/2010   PLT 195 03/05/2010   PROTIME: No results for input(s): LABPROT, INR in the last 72 hours. Chemistry No results for input(s): NA, K, CL, CO2, BUN, CREATININE, CALCIUM, PROT, BILITOT, ALKPHOS, ALT, AST, GLUCOSE in the last 168 hours.  Invalid input(s): LABALBU Lipids No results found for: CHOL, HDL, LDLCALC, TRIG BNP No results found for: PROBNP Thyroid Function Tests: No results for input(s): TSH, T4TOTAL, T3FREE, THYROIDAB in the last 72 hours.  Invalid input(s): FREET3 Miscellaneous No results found for: DDIMER  Radiology/Studies:  No results found.  EKG: Dated 14 January demonstrated sinus rhythm at 85 Intervals 20/07/35  Event Recorder personnally reviewed showed atrial fibrillation one episode lasting at least 6 minutes based on 2 separate recordings.  There is also a pause that occurred with PP  prolongation and concomitant AV block consistent with hyper vagotonia  Assessment and Plan:  Stroke-prior  Hypertension with hypertensive heart disease-poorly controlled  Rheumatoid arthritis end-stage  Atrial fibrillation-paroxysmal  Because presumably sleep apnea associated with hyper vagotonia  Patient blood pressure is poorly controlled.  I have encouraged her to fill her amlodipine prescription; using office clonidine her blood pressure came down to 160/80 prior to leaving.  She has atrial fibrillation-paroxysmal.  In the context of cryptogenic stroke it would be reasonable based on the at least 6-minute duration to consider anticoagulation.  Given her comorbidities, I would like to have this discussion with Dr. Lorin Picket prior to initiation but with her CHADS-VASc score of 9 I would strongly recommend the use of Eliquis creatinine 119 was 1.0 the appropriate dose would be 5 mg twice daily  Sherryl Manges

## 2017-06-13 NOTE — Patient Instructions (Addendum)
Medication Instructions:  Your physician has recommended you make the following change in your medication:   Begin your Amlodipine 2.5mg  tablet  Labwork: None ordered.  Testing/Procedures: None ordered.  Follow-Up: Your physician recommends that you schedule a follow-up appointment with your PCP for anticoagulation and atrial fibrillation.  Any Other Special Instructions Will Be Listed Below (If Applicable).     If you need a refill on your cardiac medications before your next appointment, please call your pharmacy.

## 2017-06-14 ENCOUNTER — Telehealth: Payer: Self-pay | Admitting: Internal Medicine

## 2017-06-14 DIAGNOSIS — M0579 Rheumatoid arthritis with rheumatoid factor of multiple sites without organ or systems involvement: Secondary | ICD-10-CM | POA: Diagnosis not present

## 2017-06-14 DIAGNOSIS — M4312 Spondylolisthesis, cervical region: Secondary | ICD-10-CM | POA: Diagnosis not present

## 2017-06-14 NOTE — Telephone Encounter (Signed)
Pt's daughter [name] called and has some question about mothers medication please give her a call back.

## 2017-06-14 NOTE — Telephone Encounter (Signed)
Called patient and received verbal premission to speak with [name] patient's daughter. Patient's daughter was concerned about taking amlodipine 2.5 mg with the rest of her medications and should she continue her clonidine. Informed patient's daughter that the patient needs to take both to keep her BP down. Informed her that she is taking a small dose of amlodipine, and that dose should get her BP down enough to not be so high. Informed patient's daughter signs and symptoms of low BP and to give [place] a call if they have any concerns or questions. Patient's daughter verbalized understanding.

## 2017-06-15 ENCOUNTER — Encounter: Payer: Self-pay | Admitting: Sports Medicine

## 2017-06-15 ENCOUNTER — Ambulatory Visit (INDEPENDENT_AMBULATORY_CARE_PROVIDER_SITE_OTHER): Payer: Medicare Other | Admitting: Sports Medicine

## 2017-06-15 DIAGNOSIS — E1142 Type 2 diabetes mellitus with diabetic polyneuropathy: Secondary | ICD-10-CM

## 2017-06-15 DIAGNOSIS — L97521 Non-pressure chronic ulcer of other part of left foot limited to breakdown of skin: Secondary | ICD-10-CM

## 2017-06-15 DIAGNOSIS — M21969 Unspecified acquired deformity of unspecified lower leg: Secondary | ICD-10-CM

## 2017-06-15 DIAGNOSIS — I739 Peripheral vascular disease, unspecified: Secondary | ICD-10-CM | POA: Diagnosis not present

## 2017-06-15 DIAGNOSIS — L97512 Non-pressure chronic ulcer of other part of right foot with fat layer exposed: Secondary | ICD-10-CM

## 2017-06-15 DIAGNOSIS — N390 Urinary tract infection, site not specified: Secondary | ICD-10-CM | POA: Diagnosis not present

## 2017-06-15 DIAGNOSIS — I69312 Visuospatial deficit and spatial neglect following cerebral infarction: Secondary | ICD-10-CM | POA: Diagnosis not present

## 2017-06-15 DIAGNOSIS — I69311 Memory deficit following cerebral infarction: Secondary | ICD-10-CM | POA: Diagnosis not present

## 2017-06-15 DIAGNOSIS — I10 Essential (primary) hypertension: Secondary | ICD-10-CM | POA: Diagnosis not present

## 2017-06-15 DIAGNOSIS — E11621 Type 2 diabetes mellitus with foot ulcer: Secondary | ICD-10-CM | POA: Diagnosis not present

## 2017-06-15 NOTE — Progress Notes (Signed)
Subjective: [name] is a 77 y.o. female patient seen in office for follow up evaluation of ulcerations bilateral,  Patient states that her back hurts from a recent fall still.  Denies nausea/fever/vomiting/chills/night sweats/shortness of breath/chest pain. Patient has no other pedal complaints at this time.  Patient is assisted by friend this visit.  Patient Active Problem List   Diagnosis Date Noted  . Coronary artery disease 12/08/2010  . Dyspnea 12/08/2010  . HYPERTENSION, BENIGN 04/01/2010  . RHEUMATOID ARTHRITIS 04/01/2010  . ABNORMAL CV (STRESS) TEST 04/01/2010  . LEG PAIN, RIGHT 03/12/2010   Current Outpatient Medications on File Prior to Visit  Medication Sig Dispense Refill  . amLODipine (NORVASC) 2.5 MG tablet Take 1 tablet (2.5 mg total) by mouth daily. 90 tablet 3  . B-D ULTRAFINE III SHORT PEN 31G X 8 MM MISC     . cloNIDine (CATAPRES) 0.1 MG tablet Take 1 tablet by mouth Three times a day.    . diazepam (VALIUM) 5 MG tablet Take 5 mg by mouth 3 (three) times daily.      Marland Kitchen doxycycline (VIBRA-TABS) 100 MG tablet Take 1 tablet (100 mg total) by mouth 2 (two) times daily. 20 tablet 0  . FREESTYLE LITE test strip     . furosemide (LASIX) 40 MG tablet Take 40 mg by mouth daily.      . insulin NPH-insulin regular (NOVOLIN 70/30) (70-30) 100 UNIT/ML injection Inject 39 units into the skin each morning and 6 units into the skin each evening.    . lansoprazole (PREVACID) 30 MG capsule Take 30 mg by mouth daily.      Marland Kitchen levothyroxine (SYNTHROID, LEVOTHROID) 75 MCG tablet Take 75 mcg by mouth daily.      . metFORMIN (GLUCOPHAGE) 1000 MG tablet Take 500 mg by mouth 3 (three) times daily. Take 1/2 tab twice a day    . mupirocin ointment (BACTROBAN) 2 % To right toe ulcer 30 g 0  . oxyCODONE (OXY IR/ROXICODONE) 5 MG immediate release tablet Take 5 mg by mouth 3 (three) times daily.     Marland Kitchen oxyCODONE (OXYCONTIN) 40 MG 12 hr tablet Take 40 mg by mouth every 8 (eight) hours.      .  potassium chloride (KLOR-CON 10) 10 MEQ CR tablet Take 10 mEq by mouth 2 (two) times daily. When taking furosemide     . predniSONE (DELTASONE) 5 MG tablet Take 5 mg by mouth 2 (two) times daily.      Marland Kitchen spironolactone (ALDACTONE) 25 MG tablet Take 25 mg by mouth 2 (two) times daily.     No current facility-administered medications on file prior to visit.    Allergies  Allergen Reactions  . Gold Anaphylaxis    Swelling of lips/tongue, throat Swelling of lips/tongue, throat  . Nsaids Other (See Comments)    Intolerance Intolerance  . Sulfa Antibiotics Nausea Only  . Sulfonamide Derivatives   . Latex Other (See Comments)    blisters  . Sulfamethoxazole Nausea Only    Recent Results (from the past 2160 hour(s))  WOUND CULTURE     Status: None   Collection Time: 05/10/17  3:15 PM  Result Value Ref Range   Gram Stain Result CANCELED     Comment: The specimen submitted does not meet the laboratory's criteria for acceptability. Refer to [employer] [facility] for specimen acceptability criteria.  Result canceled by the ancillary.    Aerobic Bacterial Culture Final report    Organism ID, Bacteria Mixed skin flora  Comment: Heavy growth    Objective: There were no vitals filed for this visit.  General: Patient is awake, alert, oriented x 3 and in no acute distress.  Dermatology: Skin is warm and dry bilateral with a partial thickness ulceration present right plantar hallux distal ulceration that measures 0.4cm x 0.3cm x 0.2cm (similar to previous) there is a keratotic border with a fibrogranular base. The ulceration does not probe to bone. There is no malodor, no active drainage, decreased erythema, decreased edema. No other acute signs of infection.   To lateral right fifth metatarsal there is a healed over ulcer now pre-ulcerative in nature..    To the right dorsal midfoot there is an healed over ulcer with no acute signs of infection.   To the left foot at the  third toe there is an ulceration that measures 0.4x0.3cm (smaller than previous) that has a granular base appears to be the bed of a previous blister with focal erythema but no other acute signs of infection.  To the plantar left forefoot there is a ulceration that measures 0.9 cm x 1cm (smaller than previous) limited to breakdown of skin with a granular base and mildly keratotic margins with no acute signs of infection.  Vascular: Dorsalis Pedis pulse = 1/4 Bilateral,  Posterior Tibial pulse = 0/4 Bilateral,  Capillary Fill Time < 5 seconds. Chronic venous skin changes.  Neurologic: Protective sensation absent to the level of the ankles bilateral using  the 5.07/10g [employer].  Musculosketal: No Pain with palpation to ulcerated areas. No pain with compression to calves bilateral. Planus and digital deformity with rigid hallux extensus on right.   No results for input(s): GRAMSTAIN, LABORGA in the last 8760 hours.  Assessment and Plan:  Problem List Items Addressed This Visit    None    Visit Diagnoses    Ulcer of left foot, limited to breakdown of skin (HCC)    -  Primary   Toe ulcer, right, with fat layer exposed (HCC)       Diabetic polyneuropathy associated with type 2 diabetes mellitus (HCC)       PVD (peripheral vascular disease) (HCC)       Deformity of foot, unspecified laterality         -Examined patient and discussed the progression of the wound and treatment alternatives. - Excisionally dedbrided ulcerations bilateral to healthy bleeding borders removing nonviable tissue using a sterile chisel blade. Wound measure post debridement as above.  Wounds were debrided to the level of the dermis with viable wound base exposed to promote healing. Hemostasis was achieved with manuel pressure. Patient tolerated procedure well without any discomfort or anesthesia necessary for this wound debridement.  -Applied medi-honey to all sites bilateral and covered with dry  sterile dressing and instructed patient to continue with daily dressings at home consisting of the same with assistance from [facility] with wound care orders as follows of Medihoney to the wounds 2x to 3x per week as previous may consider adding occasion at next visit if failed to continue to improve -May continue home PT to tolerance for gait and stability training  -Continue with felt offloading padding to post op shoe or wide tennis shoe -Advised patient to go to the ER or return to office if the wound worsens or if constitutional symptoms are present. -Patient to return to office as scheduled in 2-3 weeks for follow up care/wound evaluation or sooner if problems arise.  Asencion Islam, DPM

## 2017-06-16 DIAGNOSIS — I10 Essential (primary) hypertension: Secondary | ICD-10-CM | POA: Diagnosis not present

## 2017-06-16 DIAGNOSIS — E11621 Type 2 diabetes mellitus with foot ulcer: Secondary | ICD-10-CM | POA: Diagnosis not present

## 2017-06-16 DIAGNOSIS — N390 Urinary tract infection, site not specified: Secondary | ICD-10-CM | POA: Diagnosis not present

## 2017-06-16 DIAGNOSIS — I69312 Visuospatial deficit and spatial neglect following cerebral infarction: Secondary | ICD-10-CM | POA: Diagnosis not present

## 2017-06-16 DIAGNOSIS — L97512 Non-pressure chronic ulcer of other part of right foot with fat layer exposed: Secondary | ICD-10-CM | POA: Diagnosis not present

## 2017-06-16 DIAGNOSIS — I69311 Memory deficit following cerebral infarction: Secondary | ICD-10-CM | POA: Diagnosis not present

## 2017-06-17 DIAGNOSIS — I69312 Visuospatial deficit and spatial neglect following cerebral infarction: Secondary | ICD-10-CM | POA: Diagnosis not present

## 2017-06-17 DIAGNOSIS — L97512 Non-pressure chronic ulcer of other part of right foot with fat layer exposed: Secondary | ICD-10-CM | POA: Diagnosis not present

## 2017-06-17 DIAGNOSIS — E11621 Type 2 diabetes mellitus with foot ulcer: Secondary | ICD-10-CM | POA: Diagnosis not present

## 2017-06-17 DIAGNOSIS — I69311 Memory deficit following cerebral infarction: Secondary | ICD-10-CM | POA: Diagnosis not present

## 2017-06-17 DIAGNOSIS — I10 Essential (primary) hypertension: Secondary | ICD-10-CM | POA: Diagnosis not present

## 2017-06-17 DIAGNOSIS — N390 Urinary tract infection, site not specified: Secondary | ICD-10-CM | POA: Diagnosis not present

## 2017-06-20 DIAGNOSIS — I10 Essential (primary) hypertension: Secondary | ICD-10-CM | POA: Diagnosis not present

## 2017-06-20 DIAGNOSIS — I69312 Visuospatial deficit and spatial neglect following cerebral infarction: Secondary | ICD-10-CM | POA: Diagnosis not present

## 2017-06-20 DIAGNOSIS — M4310 Spondylolisthesis, site unspecified: Secondary | ICD-10-CM | POA: Diagnosis not present

## 2017-06-20 DIAGNOSIS — E11621 Type 2 diabetes mellitus with foot ulcer: Secondary | ICD-10-CM | POA: Diagnosis not present

## 2017-06-20 DIAGNOSIS — Z6826 Body mass index (BMI) 26.0-26.9, adult: Secondary | ICD-10-CM | POA: Diagnosis not present

## 2017-06-20 DIAGNOSIS — I69311 Memory deficit following cerebral infarction: Secondary | ICD-10-CM | POA: Diagnosis not present

## 2017-06-20 DIAGNOSIS — N390 Urinary tract infection, site not specified: Secondary | ICD-10-CM | POA: Diagnosis not present

## 2017-06-20 DIAGNOSIS — M47816 Spondylosis without myelopathy or radiculopathy, lumbar region: Secondary | ICD-10-CM | POA: Diagnosis not present

## 2017-06-20 DIAGNOSIS — L97512 Non-pressure chronic ulcer of other part of right foot with fat layer exposed: Secondary | ICD-10-CM | POA: Diagnosis not present

## 2017-06-22 DIAGNOSIS — N390 Urinary tract infection, site not specified: Secondary | ICD-10-CM | POA: Diagnosis not present

## 2017-06-22 DIAGNOSIS — Z79899 Other long term (current) drug therapy: Secondary | ICD-10-CM | POA: Diagnosis not present

## 2017-06-22 DIAGNOSIS — G5600 Carpal tunnel syndrome, unspecified upper limb: Secondary | ICD-10-CM | POA: Diagnosis not present

## 2017-06-22 DIAGNOSIS — E1140 Type 2 diabetes mellitus with diabetic neuropathy, unspecified: Secondary | ICD-10-CM | POA: Diagnosis not present

## 2017-06-22 DIAGNOSIS — E11621 Type 2 diabetes mellitus with foot ulcer: Secondary | ICD-10-CM | POA: Diagnosis not present

## 2017-06-22 DIAGNOSIS — I69311 Memory deficit following cerebral infarction: Secondary | ICD-10-CM | POA: Diagnosis not present

## 2017-06-22 DIAGNOSIS — M21949 Unspecified acquired deformity of hand, unspecified hand: Secondary | ICD-10-CM | POA: Diagnosis not present

## 2017-06-22 DIAGNOSIS — L97512 Non-pressure chronic ulcer of other part of right foot with fat layer exposed: Secondary | ICD-10-CM | POA: Diagnosis not present

## 2017-06-22 DIAGNOSIS — R768 Other specified abnormal immunological findings in serum: Secondary | ICD-10-CM | POA: Diagnosis not present

## 2017-06-22 DIAGNOSIS — I10 Essential (primary) hypertension: Secondary | ICD-10-CM | POA: Diagnosis not present

## 2017-06-22 DIAGNOSIS — Z87440 Personal history of urinary (tract) infections: Secondary | ICD-10-CM | POA: Diagnosis not present

## 2017-06-22 DIAGNOSIS — M549 Dorsalgia, unspecified: Secondary | ICD-10-CM | POA: Diagnosis not present

## 2017-06-22 DIAGNOSIS — Z8673 Personal history of transient ischemic attack (TIA), and cerebral infarction without residual deficits: Secondary | ICD-10-CM | POA: Diagnosis not present

## 2017-06-22 DIAGNOSIS — M75101 Unspecified rotator cuff tear or rupture of right shoulder, not specified as traumatic: Secondary | ICD-10-CM | POA: Diagnosis not present

## 2017-06-22 DIAGNOSIS — J90 Pleural effusion, not elsewhere classified: Secondary | ICD-10-CM | POA: Diagnosis not present

## 2017-06-22 DIAGNOSIS — L97501 Non-pressure chronic ulcer of other part of unspecified foot limited to breakdown of skin: Secondary | ICD-10-CM | POA: Diagnosis not present

## 2017-06-22 DIAGNOSIS — M8589 Other specified disorders of bone density and structure, multiple sites: Secondary | ICD-10-CM | POA: Diagnosis not present

## 2017-06-22 DIAGNOSIS — E559 Vitamin D deficiency, unspecified: Secondary | ICD-10-CM | POA: Diagnosis not present

## 2017-06-22 DIAGNOSIS — Z794 Long term (current) use of insulin: Secondary | ICD-10-CM | POA: Diagnosis not present

## 2017-06-22 DIAGNOSIS — M3500 Sicca syndrome, unspecified: Secondary | ICD-10-CM | POA: Diagnosis not present

## 2017-06-22 DIAGNOSIS — M4310 Spondylolisthesis, site unspecified: Secondary | ICD-10-CM | POA: Diagnosis not present

## 2017-06-22 DIAGNOSIS — I69312 Visuospatial deficit and spatial neglect following cerebral infarction: Secondary | ICD-10-CM | POA: Diagnosis not present

## 2017-06-22 DIAGNOSIS — Z7952 Long term (current) use of systemic steroids: Secondary | ICD-10-CM | POA: Diagnosis not present

## 2017-06-22 DIAGNOSIS — M0579 Rheumatoid arthritis with rheumatoid factor of multiple sites without organ or systems involvement: Secondary | ICD-10-CM | POA: Diagnosis not present

## 2017-06-22 DIAGNOSIS — M8580 Other specified disorders of bone density and structure, unspecified site: Secondary | ICD-10-CM | POA: Diagnosis not present

## 2017-06-22 HISTORY — DX: Other specified abnormal immunological findings in serum: R76.8

## 2017-06-23 DIAGNOSIS — N390 Urinary tract infection, site not specified: Secondary | ICD-10-CM | POA: Diagnosis not present

## 2017-06-23 DIAGNOSIS — E11621 Type 2 diabetes mellitus with foot ulcer: Secondary | ICD-10-CM | POA: Diagnosis not present

## 2017-06-23 DIAGNOSIS — L97512 Non-pressure chronic ulcer of other part of right foot with fat layer exposed: Secondary | ICD-10-CM | POA: Diagnosis not present

## 2017-06-23 DIAGNOSIS — I69312 Visuospatial deficit and spatial neglect following cerebral infarction: Secondary | ICD-10-CM | POA: Diagnosis not present

## 2017-06-23 DIAGNOSIS — I69311 Memory deficit following cerebral infarction: Secondary | ICD-10-CM | POA: Diagnosis not present

## 2017-06-23 DIAGNOSIS — I10 Essential (primary) hypertension: Secondary | ICD-10-CM | POA: Diagnosis not present

## 2017-06-24 DIAGNOSIS — E11621 Type 2 diabetes mellitus with foot ulcer: Secondary | ICD-10-CM | POA: Diagnosis not present

## 2017-06-24 DIAGNOSIS — I69312 Visuospatial deficit and spatial neglect following cerebral infarction: Secondary | ICD-10-CM | POA: Diagnosis not present

## 2017-06-24 DIAGNOSIS — I69311 Memory deficit following cerebral infarction: Secondary | ICD-10-CM | POA: Diagnosis not present

## 2017-06-24 DIAGNOSIS — L97512 Non-pressure chronic ulcer of other part of right foot with fat layer exposed: Secondary | ICD-10-CM | POA: Diagnosis not present

## 2017-06-24 DIAGNOSIS — N390 Urinary tract infection, site not specified: Secondary | ICD-10-CM | POA: Diagnosis not present

## 2017-06-24 DIAGNOSIS — I10 Essential (primary) hypertension: Secondary | ICD-10-CM | POA: Diagnosis not present

## 2017-06-27 ENCOUNTER — Encounter: Payer: Self-pay | Admitting: Neurology

## 2017-06-27 ENCOUNTER — Ambulatory Visit (INDEPENDENT_AMBULATORY_CARE_PROVIDER_SITE_OTHER): Payer: Medicare Other | Admitting: Neurology

## 2017-06-27 DIAGNOSIS — I639 Cerebral infarction, unspecified: Secondary | ICD-10-CM | POA: Insufficient documentation

## 2017-06-27 DIAGNOSIS — E11621 Type 2 diabetes mellitus with foot ulcer: Secondary | ICD-10-CM | POA: Diagnosis not present

## 2017-06-27 DIAGNOSIS — I480 Paroxysmal atrial fibrillation: Secondary | ICD-10-CM | POA: Diagnosis not present

## 2017-06-27 DIAGNOSIS — I69312 Visuospatial deficit and spatial neglect following cerebral infarction: Secondary | ICD-10-CM | POA: Diagnosis not present

## 2017-06-27 DIAGNOSIS — I10 Essential (primary) hypertension: Secondary | ICD-10-CM | POA: Diagnosis not present

## 2017-06-27 DIAGNOSIS — N390 Urinary tract infection, site not specified: Secondary | ICD-10-CM | POA: Diagnosis not present

## 2017-06-27 DIAGNOSIS — L97512 Non-pressure chronic ulcer of other part of right foot with fat layer exposed: Secondary | ICD-10-CM | POA: Diagnosis not present

## 2017-06-27 DIAGNOSIS — I69311 Memory deficit following cerebral infarction: Secondary | ICD-10-CM | POA: Diagnosis not present

## 2017-06-27 HISTORY — DX: Cerebral infarction, unspecified: I63.9

## 2017-06-27 HISTORY — DX: Paroxysmal atrial fibrillation: I48.0

## 2017-06-27 MED ORDER — APIXABAN 2.5 MG PO TABS: 5.0000 mg | ORAL_TABLET | Freq: Two times a day (BID) | ORAL | Status: DC

## 2017-06-27 MED ORDER — ROSUVASTATIN CALCIUM 5 MG PO TABS: 2.5000 mg | ORAL_TABLET | Freq: Every day | ORAL | 3 refills | Status: DC

## 2017-06-27 NOTE — Progress Notes (Signed)
  Subjective:  Patient ID: [name], female    DOB: 07/11/1940,  MRN: [id number]  Chief Complaint  Patient presents with  . Foot Ulcer    new ulcer left foot sub met 2 tunneling up 3rd toe    77 y.o. female returns for wound care. Reports new ulceration to the bottom of the foot and onto the 3rd toe. Denies N/V/F/Ch.  Objective:  There were no vitals filed for this visit. General AA&O x3. Normal mood and affect.  Vascular Foot warm to touch.  Neurologic Sensation grossly diminished.  Dermatologic (Wound) Wound Location: L 2nd MPJ plantar Wound Measurement: 1cm x 1cm post debridement Wound Base: Granular/Healthy Peri-wound: Calloused Exudate: None: wound tissue dry   Orthopedic: No pain to palpation either foot.   Assessment & Plan:  Patient was evaluated and treated and all questions answered.  Ulcer L 2nd MPJ -Debridement as below. -Dressed with silvadene, DSD. -Culture taken  Procedure: Excisional Debridement of Wound Rationale: Removal of non-viable soft tissue from the wound to promote healing.  Anesthesia: none Pre-Debridement Wound Measurements: overlying hyperkeratosis  Post-Debridement Wound Measurements: 1 cm x 1 cm x 0.1 cm  Type of Debridement: Excisional Tissue Removed: Non-viable soft tissue Depth of Debridement: subq Instrumentation: 312 blade, tissue nipper. Technique: Sharp excisional debridement to bleeding, viable wound base.  Dressing: Dry, sterile, compression dressing. Disposition: Patient tolerated procedure well. Patient to return in 1 week for follow-up.  No Follow-up on file.

## 2017-06-27 NOTE — Progress Notes (Addendum)
[facility] [address] [place]. [place] [address] [phone]       OFFICE CONSULT NOTE  Ms. Clelia Schaumann Date of Birth:  Dec 03, 1940 Medical Record Number:  [id number]   Referring MD:  Garth Schlatter  Reason for Referral:   Stroke HPI: [name] is a 77 year old pleasant Caucasian lady seen today for initial office consultation visit. She is accompanied by her daughter. History is obtained from them as well as review of medical records since by the referring physician. Have personally reviewed imaging films in PACS. She states that she developed the headache followed by right-sided peripheral vision disturbances in early December 2018. This followed a bout of recurrent urinary tract infections. She is to run into objects and had blurred vision. She in fact fell once and hit her back. She is also complaining of low back pain and radiating pain into the right groin. She plans to see a physician in the pain clinic. 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 CT angiogram showed no large vessel extracranial or intracranial stenosis but to my review of films shows occlusion of the left PCA and the distal portion. Transthoracic echo showed normal ejection fraction without cardiac source of embolism. LDL cholesterol was 74 mg percent and hemoglobin A1c 9.4. Patient was started initially on aspirin but she also had outpatient cardiac monitor placed which showed paroxysmal A. fib. She saw Dr. Graciela Husbands last week who recommended eliquis but patient has not received the prescription yet. She has no prior history of strokes TIAs or significant neurological problems. She does have significant rheumatoid arthritis for which she is on prednisone. She has significant pulmonary nodules and effusion in the right lung. She is currently getting home physical and occupation therapy. She can walk with a wheeled walker but  her back seems to be more bothering her. She still has loss of peripheral vision and has noticed some diminished short-term memory and cognitive decline. She states her blood pressure well controlled blood sugars continue to fluctuate and is not well controlled.She  Is also been bothered by significant shoulder pain and plans to get injection into her shoulder at the pain clinic  ROS:   14 system review of systems is positive for weight loss, fatigue, incontinence, blurred vision, loss of vision, easy bruising, joint pain and swelling, aching muscles, allergies, frequent infections, memory loss, confusion, headache, numbness, weakness, dizziness, anxiety and all other systems negative PMH:  Past Medical History:  Diagnosis Date  . HTN (hypertension)   . Hyperlipidemia   . Hypothyroidism   . Insulin dependent diabetes mellitus (HCC)   . Obesity   . Stroke [facility])     Social History:  Social History   Socioeconomic History  . Marital status: Married    Spouse name: Not on file  . Number of children: 2  . Years of education: Not on file  . Highest education level: Not on file  Social Needs  . Financial resource strain: Not on file  . Food insecurity - worry: Not on file  . Food insecurity - inability: Not on file  . Transportation needs - medical: Not on file  . Transportation needs - non-medical: Not on file  Occupational History  . Not on file  Tobacco Use  . Smoking status: Never Smoker  . Smokeless tobacco: Never Used  Substance and Sexual Activity  . Alcohol use: No  . Drug use: No  .  Sexual activity: Not on file  Other Topics Concern  . Not on file  Social History Narrative   Lives in [place] with her husband.    Medications:   Current Outpatient Medications on File Prior to Visit  Medication Sig Dispense Refill  . amLODipine (NORVASC) 2.5 MG tablet Take 1 tablet (2.5 mg total) by mouth daily. 90 tablet 3  . B-D ULTRAFINE III SHORT PEN 31G X 8 MM MISC     .  cloNIDine (CATAPRES) 0.1 MG tablet Take 1 tablet by mouth Three times a day.    . diazepam (VALIUM) 5 MG tablet Take 5 mg by mouth 3 (three) times daily.      Marland Kitchen FREESTYLE LITE test strip     . furosemide (LASIX) 40 MG tablet Take 40 mg by mouth daily.      . insulin NPH-insulin regular (NOVOLIN 70/30) (70-30) 100 UNIT/ML injection Inject 39 units into the skin each morning and 6 units into the skin each evening.    . lansoprazole (PREVACID) 30 MG capsule Take 30 mg by mouth daily.      Marland Kitchen levothyroxine (SYNTHROID, LEVOTHROID) 75 MCG tablet Take 75 mcg by mouth daily.      Marland Kitchen lidocaine (LIDODERM) 5 % UNW AND APP 1 PA TO SKIN UP TO 16 H PER DAY  6  . metFORMIN (GLUCOPHAGE) 1000 MG tablet Take 500 mg by mouth 3 (three) times daily. Take 1/2 tab twice a day    . morphine (MS CONTIN) 15 MG 12 hr tablet Take by mouth.    . mupirocin ointment (BACTROBAN) 2 % To right toe ulcer 30 g 0  . oxyCODONE (OXY IR/ROXICODONE) 5 MG immediate release tablet Take 5 mg by mouth 3 (three) times daily.     Marland Kitchen oxyCODONE (OXYCONTIN) 40 MG 12 hr tablet Take 40 mg by mouth every 8 (eight) hours.      . potassium chloride (KLOR-CON 10) 10 MEQ CR tablet Take 10 mEq by mouth 2 (two) times daily. When taking furosemide     . predniSONE (DELTASONE) 5 MG tablet Take 5 mg by mouth 2 (two) times daily.     Marland Kitchen spironolactone (ALDACTONE) 25 MG tablet Take 25 mg by mouth 2 (two) times daily.     No current facility-administered medications on file prior to visit.     Allergies:   Allergies  Allergen Reactions  . Gold Anaphylaxis    Swelling of lips/tongue, throat Swelling of lips/tongue, throat  . Nsaids Other (See Comments)    Intolerance Intolerance  . Sulfa Antibiotics Nausea Only  . Sulfonamide Derivatives   . Latex Other (See Comments)    blisters  . Sulfamethoxazole Nausea Only    Physical Exam General: Frail elderly Caucasian lady seated, in no evident distress Head: head normocephalic and atraumatic.   Neck:  supple with no carotid or supraclavicular bruits Cardiovascular: regular rate and rhythm, no murmurs Musculoskeletal: Significant rheumatoid deformity in both hands and fingers Skin:  no rash but scattered petichiae Vascular:  Normal pulses all extremities  Neurologic Exam Mental Status: Awake and fully alert. Oriented to place and time. Recent and remote memory intact. Attention span, concentration and fund of knowledge appropriate. Mood and affect appropriate. Recall 3/3. Able to name only 7 animals with 40 legs. Cranial Nerves: Fundoscopic exam reveals sharp disc margins. Pupils equal, briskly reactive to light. Extraocular movements full without nystagmus. Visual fields show dense right homonymous hemianopsia l to confrontation. Hearing intact. Facial sensation intact. Face, tongue,  palate moves normally and symmetrically.  Motor: Normal bulk and tone. Normal strength in all tested extremity muscles. Except mild right shoulder and hand weakness due to rheumatoid arthritis deformity and pain Sensory.: intact to touch , pinprick , position and vibratory sensation.  Coordination: Rapid alternating movements normal in all extremities. Finger-to-nose and heel-to-shin performed accurately bilaterally. Gait and Station: Arises from chair with some difficulty. Stance is stooped.  Needs a wheeled walker to walk.  Reflexes: 1+ and symmetric. Toes downgoing.   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 Vascular risk factors of diabetes, hypertension, mild hyperlipidemia and atrial fibrillation.    PLAN: I had a long discussion with the patient and her daughter regarding her recent left occipital infarct in December 2018 and subsequent diagnoses of paroxysmal A. fib and heart monitoring and persistent right-sided visual field defect. I  recommend eliquis for long-term anticoagulation  Given CHAD2VASc score of 8  9(15.2% risk of TIA/stroke )And HAS-BLED score of 4 ( 8.9% bleed risk ) and have counseled her to be careful about fall falls and bleeding risk. Patient is having significant back pain and may need some intervention for that and may have may have to hold eliquis for 3 days prior to the procedure and resumed after the procedure. I also recommend starting Crestor 2.5 mg for her mild hyperlipidemia and continue ongoing home physical and occupational therapy. Strict control of hypertension with blood pressure goal below 130/90, diabetes with hemoglobin A1c goal below 6.5% and lipids with LDL cholesterol goal below 70 mg percent. She was counseled to use a walker at all times and we also discussed fall and safety prevention precautions. She was advised to keep her appointment at the pain clinic in case she needed injection into her shoulder she could hold off on starting eliquis 2 after the shoulder injection. Patient was advised not to drive due to her visual field defect and she voiced understanding. Greater than 50% time during this 45 minute consultation visit was spent on counseling and coordination of care about her embolic stroke, atrial fibrillation, discussion about risk-benefit of eliquis and on same questions She will return for follow-up with me in 3 months or call earlier if necessary Delia Heady, MD  [facility] [address] [place], [place] [address]  Phone [phone] Fax [phone] Note: This document was prepared with digital dictation and possible smart phrase technology. Any transcriptional errors that result from this process are unintentional.

## 2017-06-27 NOTE — Patient Instructions (Addendum)
I had a long discussion with the patient and her daughter regarding her recent left occipital infarct in December 2018 and subsequent diagnoses of paroxysmal A. fib and heart monitoring and persistent right-sided visual field defect. I recommend eliquis for long-term anticoagulation  Given CHAD2VASc score of 8  9(15.2% risk of TIA/stroke )And HAS-BLED score of 4 ( 8.9% bleed risk ) and have counseled her to be careful about fall falls and bleeding risk. Patient is having significant back pain and may need some intervention for that and may have may have to hold eliquis for 3 days prior to the procedure and resumed after the procedure. I also recommend starting Crestor 2.5 mg for her mild hyperlipidemia and continue ongoing home physical and occupational therapy. Strict control of hypertension with blood pressure goal below 130/90, diabetes with hemoglobin A1c goal below 6.5% and lipids with LDL cholesterol goal below 70 mg percent. She was counseled to use a walker at all times and we also discussed fall and safety prevention precautions. She was advised to keep her appointment at the pain clinic in case she needed injection into her shoulder she could hold off on starting eliquis 2 after the shoulder injection. She will return for follow-up with me in 3 months or call earlier if necessary  Fall Prevention in the Home Falls can cause injuries. They can happen to people of all ages. There are many things you can do to make your home safe and to help prevent falls. What can I do on the outside of my home?  Regularly fix the edges of walkways and driveways and fix any cracks.  Remove anything that might make you trip as you walk through a door, such as a raised step or threshold.  Trim any bushes or trees on the path to your home.  Use bright outdoor lighting.  Clear any walking paths of anything that might make someone trip, such as rocks or tools.  Regularly check to see if handrails are loose or  broken. Make sure that both sides of any steps have handrails.  Any raised decks and porches should have guardrails on the edges.  Have any leaves, snow, or ice cleared regularly.  Use sand or salt on walking paths during winter.  Clean up any spills in your garage right away. This includes oil or grease spills. What can I do in the bathroom?  Use night lights.  Install grab bars by the toilet and in the tub and shower. Do not use towel bars as grab bars.  Use non-skid mats or decals in the tub or shower.  If you need to sit down in the shower, use a plastic, non-slip stool.  Keep the floor dry. Clean up any water that spills on the floor as soon as it happens.  Remove soap buildup in the tub or shower regularly.  Attach bath mats securely with double-sided non-slip rug tape.  Do not have throw rugs and other things on the floor that can make you trip. What can I do in the bedroom?  Use night lights.  Make sure that you have a light by your bed that is easy to reach.  Do not use any sheets or blankets that are too big for your bed. They should not hang down onto the floor.  Have a firm chair that has side arms. You can use this for support while you get dressed.  Do not have throw rugs and other things on the floor that can make  you trip. What can I do in the kitchen?  Clean up any spills right away.  Avoid walking on wet floors.  Keep items that you use a lot in easy-to-reach places.  If you need to reach something above you, use a strong step stool that has a grab bar.  Keep electrical cords out of the way.  Do not use floor polish or wax that makes floors slippery. If you must use wax, use non-skid floor wax.  Do not have throw rugs and other things on the floor that can make you trip. What can I do with my stairs?  Do not leave any items on the stairs.  Make sure that there are handrails on both sides of the stairs and use them. Fix handrails that are  broken or loose. Make sure that handrails are as long as the stairways.  Check any carpeting to make sure that it is firmly attached to the stairs. Fix any carpet that is loose or worn.  Avoid having throw rugs at the top or bottom of the stairs. If you do have throw rugs, attach them to the floor with carpet tape.  Make sure that you have a light switch at the top of the stairs and the bottom of the stairs. If you do not have them, ask someone to add them for you. What else can I do to help prevent falls?  Wear shoes that: ? Do not have high heels. ? Have rubber bottoms. ? Are comfortable and fit you well. ? Are closed at the toe. Do not wear sandals.  If you use a stepladder: ? Make sure that it is fully opened. Do not climb a closed stepladder. ? Make sure that both sides of the stepladder are locked into place. ? Ask someone to hold it for you, if possible.  Clearly mark and make sure that you can see: ? Any grab bars or handrails. ? First and last steps. ? Where the edge of each step is.  Use tools that help you move around (mobility aids) if they are needed. These include: ? Canes. ? Walkers. ? Scooters. ? Crutches.  Turn on the lights when you go into a dark area. Replace any light bulbs as soon as they burn out.  Set up your furniture so you have a clear path. Avoid moving your furniture around.  If any of your floors are uneven, fix them.  If there are any pets around you, be aware of where they are.  Review your medicines with your doctor. Some medicines can make you feel dizzy. This can increase your chance of falling. Ask your doctor what other things that you can do to help prevent falls. This information is not intended to replace advice given to you by your health care provider. Make sure you discuss any questions you have with your health care provider. Document Released: 02/13/2009 Document Revised: 09/25/2015 Document Reviewed: 05/24/2014 Elsevier  Interactive Patient Education  [employer].

## 2017-06-28 DIAGNOSIS — I69312 Visuospatial deficit and spatial neglect following cerebral infarction: Secondary | ICD-10-CM | POA: Diagnosis not present

## 2017-06-28 DIAGNOSIS — M0579 Rheumatoid arthritis with rheumatoid factor of multiple sites without organ or systems involvement: Secondary | ICD-10-CM | POA: Diagnosis not present

## 2017-06-28 DIAGNOSIS — I69311 Memory deficit following cerebral infarction: Secondary | ICD-10-CM | POA: Diagnosis not present

## 2017-06-28 DIAGNOSIS — E11621 Type 2 diabetes mellitus with foot ulcer: Secondary | ICD-10-CM | POA: Diagnosis not present

## 2017-06-28 DIAGNOSIS — N390 Urinary tract infection, site not specified: Secondary | ICD-10-CM | POA: Diagnosis not present

## 2017-06-28 DIAGNOSIS — L97512 Non-pressure chronic ulcer of other part of right foot with fat layer exposed: Secondary | ICD-10-CM | POA: Diagnosis not present

## 2017-06-28 DIAGNOSIS — I10 Essential (primary) hypertension: Secondary | ICD-10-CM | POA: Diagnosis not present

## 2017-06-29 DIAGNOSIS — I10 Essential (primary) hypertension: Secondary | ICD-10-CM | POA: Diagnosis not present

## 2017-06-29 DIAGNOSIS — I69312 Visuospatial deficit and spatial neglect following cerebral infarction: Secondary | ICD-10-CM | POA: Diagnosis not present

## 2017-06-29 DIAGNOSIS — N390 Urinary tract infection, site not specified: Secondary | ICD-10-CM | POA: Diagnosis not present

## 2017-06-29 DIAGNOSIS — E11621 Type 2 diabetes mellitus with foot ulcer: Secondary | ICD-10-CM | POA: Diagnosis not present

## 2017-06-29 DIAGNOSIS — I69311 Memory deficit following cerebral infarction: Secondary | ICD-10-CM | POA: Diagnosis not present

## 2017-06-29 DIAGNOSIS — L97512 Non-pressure chronic ulcer of other part of right foot with fat layer exposed: Secondary | ICD-10-CM | POA: Diagnosis not present

## 2017-06-30 DIAGNOSIS — N390 Urinary tract infection, site not specified: Secondary | ICD-10-CM | POA: Diagnosis not present

## 2017-06-30 DIAGNOSIS — E11621 Type 2 diabetes mellitus with foot ulcer: Secondary | ICD-10-CM | POA: Diagnosis not present

## 2017-06-30 DIAGNOSIS — L97512 Non-pressure chronic ulcer of other part of right foot with fat layer exposed: Secondary | ICD-10-CM | POA: Diagnosis not present

## 2017-06-30 DIAGNOSIS — I69312 Visuospatial deficit and spatial neglect following cerebral infarction: Secondary | ICD-10-CM | POA: Diagnosis not present

## 2017-06-30 DIAGNOSIS — I69311 Memory deficit following cerebral infarction: Secondary | ICD-10-CM | POA: Diagnosis not present

## 2017-06-30 DIAGNOSIS — I10 Essential (primary) hypertension: Secondary | ICD-10-CM | POA: Diagnosis not present

## 2017-07-02 DIAGNOSIS — R51 Headache: Secondary | ICD-10-CM | POA: Diagnosis not present

## 2017-07-02 DIAGNOSIS — S3993XA Unspecified injury of pelvis, initial encounter: Secondary | ICD-10-CM | POA: Diagnosis not present

## 2017-07-02 DIAGNOSIS — R102 Pelvic and perineal pain: Secondary | ICD-10-CM | POA: Diagnosis not present

## 2017-07-02 DIAGNOSIS — R42 Dizziness and giddiness: Secondary | ICD-10-CM | POA: Diagnosis not present

## 2017-07-02 DIAGNOSIS — J90 Pleural effusion, not elsewhere classified: Secondary | ICD-10-CM | POA: Diagnosis not present

## 2017-07-02 DIAGNOSIS — R1031 Right lower quadrant pain: Secondary | ICD-10-CM | POA: Diagnosis not present

## 2017-07-02 DIAGNOSIS — R404 Transient alteration of awareness: Secondary | ICD-10-CM | POA: Diagnosis not present

## 2017-07-02 DIAGNOSIS — S32030A Wedge compression fracture of third lumbar vertebra, initial encounter for closed fracture: Secondary | ICD-10-CM | POA: Diagnosis not present

## 2017-07-02 DIAGNOSIS — I160 Hypertensive urgency: Secondary | ICD-10-CM | POA: Diagnosis not present

## 2017-07-02 DIAGNOSIS — R410 Disorientation, unspecified: Secondary | ICD-10-CM | POA: Diagnosis not present

## 2017-07-04 DIAGNOSIS — Z6826 Body mass index (BMI) 26.0-26.9, adult: Secondary | ICD-10-CM | POA: Diagnosis not present

## 2017-07-04 DIAGNOSIS — E11621 Type 2 diabetes mellitus with foot ulcer: Secondary | ICD-10-CM | POA: Diagnosis not present

## 2017-07-04 DIAGNOSIS — N390 Urinary tract infection, site not specified: Secondary | ICD-10-CM | POA: Diagnosis not present

## 2017-07-04 DIAGNOSIS — I69312 Visuospatial deficit and spatial neglect following cerebral infarction: Secondary | ICD-10-CM | POA: Diagnosis not present

## 2017-07-04 DIAGNOSIS — I69311 Memory deficit following cerebral infarction: Secondary | ICD-10-CM | POA: Diagnosis not present

## 2017-07-04 DIAGNOSIS — L97512 Non-pressure chronic ulcer of other part of right foot with fat layer exposed: Secondary | ICD-10-CM | POA: Diagnosis not present

## 2017-07-04 DIAGNOSIS — I10 Essential (primary) hypertension: Secondary | ICD-10-CM | POA: Diagnosis not present

## 2017-07-04 DIAGNOSIS — K869 Disease of pancreas, unspecified: Secondary | ICD-10-CM | POA: Diagnosis not present

## 2017-07-04 DIAGNOSIS — S32000A Wedge compression fracture of unspecified lumbar vertebra, initial encounter for closed fracture: Secondary | ICD-10-CM | POA: Diagnosis not present

## 2017-07-05 DIAGNOSIS — L97412 Non-pressure chronic ulcer of right heel and midfoot with fat layer exposed: Secondary | ICD-10-CM | POA: Diagnosis not present

## 2017-07-05 DIAGNOSIS — L97422 Non-pressure chronic ulcer of left heel and midfoot with fat layer exposed: Secondary | ICD-10-CM | POA: Diagnosis not present

## 2017-07-05 DIAGNOSIS — I10 Essential (primary) hypertension: Secondary | ICD-10-CM | POA: Diagnosis not present

## 2017-07-05 DIAGNOSIS — E11621 Type 2 diabetes mellitus with foot ulcer: Secondary | ICD-10-CM | POA: Diagnosis not present

## 2017-07-05 DIAGNOSIS — L97512 Non-pressure chronic ulcer of other part of right foot with fat layer exposed: Secondary | ICD-10-CM | POA: Diagnosis not present

## 2017-07-05 DIAGNOSIS — M069 Rheumatoid arthritis, unspecified: Secondary | ICD-10-CM | POA: Diagnosis not present

## 2017-07-06 DIAGNOSIS — E11621 Type 2 diabetes mellitus with foot ulcer: Secondary | ICD-10-CM | POA: Diagnosis not present

## 2017-07-06 DIAGNOSIS — M069 Rheumatoid arthritis, unspecified: Secondary | ICD-10-CM | POA: Diagnosis not present

## 2017-07-06 DIAGNOSIS — I10 Essential (primary) hypertension: Secondary | ICD-10-CM | POA: Diagnosis not present

## 2017-07-06 DIAGNOSIS — L97422 Non-pressure chronic ulcer of left heel and midfoot with fat layer exposed: Secondary | ICD-10-CM | POA: Diagnosis not present

## 2017-07-06 DIAGNOSIS — L97512 Non-pressure chronic ulcer of other part of right foot with fat layer exposed: Secondary | ICD-10-CM | POA: Diagnosis not present

## 2017-07-06 DIAGNOSIS — L97412 Non-pressure chronic ulcer of right heel and midfoot with fat layer exposed: Secondary | ICD-10-CM | POA: Diagnosis not present

## 2017-07-07 ENCOUNTER — Ambulatory Visit (INDEPENDENT_AMBULATORY_CARE_PROVIDER_SITE_OTHER): Payer: Medicare Other | Admitting: Sports Medicine

## 2017-07-07 ENCOUNTER — Encounter: Payer: Self-pay | Admitting: Sports Medicine

## 2017-07-07 DIAGNOSIS — M21969 Unspecified acquired deformity of unspecified lower leg: Secondary | ICD-10-CM

## 2017-07-07 DIAGNOSIS — E1142 Type 2 diabetes mellitus with diabetic polyneuropathy: Secondary | ICD-10-CM

## 2017-07-07 DIAGNOSIS — I739 Peripheral vascular disease, unspecified: Secondary | ICD-10-CM

## 2017-07-07 DIAGNOSIS — L97521 Non-pressure chronic ulcer of other part of left foot limited to breakdown of skin: Secondary | ICD-10-CM

## 2017-07-07 DIAGNOSIS — T148XXA Other injury of unspecified body region, initial encounter: Secondary | ICD-10-CM

## 2017-07-07 DIAGNOSIS — L97512 Non-pressure chronic ulcer of other part of right foot with fat layer exposed: Secondary | ICD-10-CM

## 2017-07-07 NOTE — Progress Notes (Signed)
Subjective: [name] is a 77 y.o. female patient seen in office for follow up evaluation of ulcerations bilateral. Denies nausea/fever/vomiting/chills/night sweats/shortness of breath/chest pain. 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 Patient has no other pedal complaints at this time.  Patient is assisted by friend this visit.  Patient Active Problem List   Diagnosis Date Noted  . 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 PAF (paroxysmal atrial fibrillation) (HCC) 06/27/2017  . Coronary artery disease 12/08/2010  . Dyspnea 12/08/2010  . HYPERTENSION, BENIGN 04/01/2010  . RHEUMATOID ARTHRITIS 04/01/2010  . ABNORMAL CV (STRESS) TEST 04/01/2010  . LEG PAIN, RIGHT 03/12/2010   Current Outpatient Medications on File Prior to Visit  Medication Sig Dispense Refill  . amLODipine (NORVASC) 2.5 MG tablet Take 1 tablet (2.5 mg total) by mouth daily. 90 tablet 3  . B-D ULTRAFINE III SHORT PEN 31G X 8 MM MISC     . cloNIDine (CATAPRES) 0.1 MG tablet Take 1 tablet by mouth Three times a day.    . diazepam (VALIUM) 5 MG tablet Take 5 mg by mouth 3 (three) times daily.      Marland Kitchen FREESTYLE LITE test strip     . furosemide (LASIX) 40 MG tablet Take 40 mg by mouth daily.      . insulin NPH-insulin regular (NOVOLIN 70/30) (70-30) 100 UNIT/ML injection Inject 39 units into the skin each morning and 6 units into the skin each evening.    . lansoprazole (PREVACID) 30 MG capsule Take 30 mg by mouth daily.      Marland Kitchen levothyroxine (SYNTHROID, LEVOTHROID) 75 MCG tablet Take 75 mcg by mouth daily.      Marland Kitchen lidocaine (LIDODERM) 5 % UNW AND APP 1 PA TO SKIN UP TO 16 H PER DAY  6  . metFORMIN (GLUCOPHAGE) 1000 MG tablet Take 500 mg by mouth 3 (three) times daily. Take 1/2 tab twice a day    . morphine (MS CONTIN) 15 MG 12 hr tablet Take by mouth.    . mupirocin ointment (BACTROBAN) 2 % To right toe ulcer 30 g 0  . oxyCODONE (OXY IR/ROXICODONE) 5 MG  immediate release tablet Take 5 mg by mouth 3 (three) times daily.     Marland Kitchen oxyCODONE (OXYCONTIN) 40 MG 12 hr tablet Take 40 mg by mouth every 8 (eight) hours.      . potassium chloride (KLOR-CON 10) 10 MEQ CR tablet Take 10 mEq by mouth 2 (two) times daily. When taking furosemide     . predniSONE (DELTASONE) 5 MG tablet Take 5 mg by mouth 2 (two) times daily.     . rosuvastatin (CRESTOR) 5 MG tablet Take 0.5 tablets (2.5 mg total) by mouth daily. 30 tablet 3  . spironolactone (ALDACTONE) 25 MG tablet Take 25 mg by mouth 2 (two) times daily.     Current Facility-Administered Medications on File Prior to Visit  Medication Dose Route Frequency Provider Last Rate Last Dose  . apixaban (ELIQUIS) tablet 5 mg  5 mg Oral BID Micki Riley, MD       Allergies  Allergen Reactions  . Gold Anaphylaxis    Swelling of lips/tongue, throat Swelling of lips/tongue, throat  . Nsaids Other (See Comments)    Intolerance Intolerance  . Sulfa Antibiotics Nausea Only  . Sulfonamide Derivatives   . Latex Other (See Comments)    blisters  . Sulfamethoxazole Nausea Only    Recent Results (from the  past 2160 hour(s))  WOUND CULTURE     Status: None   Collection Time: 05/10/17  3:15 PM  Result Value Ref Range   Gram Stain Result CANCELED     Comment: The specimen submitted does not meet the laboratory's criteria for acceptability. Refer to [facility] for specimen acceptability criteria.  Result canceled by the ancillary.    Aerobic Bacterial Culture Final report    Organism ID, Bacteria Mixed skin flora     Comment: Heavy growth    Objective: There were no vitals filed for this visit.  General: Patient is awake, alert, oriented x 3 and in no acute distress.  Dermatology: Skin is warm and dry bilateral with a partial thickness ulceration present right plantar hallux distal ulceration that measures 0.3cm x 0.2cm x 0.2cm (slightly smaller than previous) there is a keratotic  border with a fibrogranular base. The ulceration does not probe to bone. There is no malodor, no active drainage, decreased erythema, decreased edema. No other acute signs of infection.   To lateral right fifth metatarsal is healed over ulcer now pre-ulcerative in nature.  To the right dorsal midfoot is an healed over now pre-ulcerative in nature.  To the left foot at the third toe there is healed over now pre-ulcerative in nature.  To the plantar left forefoot there is a ulceration that measures 0.6 cm x 0.8 cm (smaller than previous) limited to breakdown of skin with a granular base and mildly keratotic margins with no acute signs of infection.  Vascular: Dorsalis Pedis pulse = 1/4 Bilateral,  Posterior Tibial pulse = 0/4 Bilateral,  Capillary Fill Time < 5 seconds. Chronic venous skin changes.  Neurologic: Protective sensation absent to the level of the ankles bilateral using  the 5.07/10g [employer].  Musculosketal: No Pain with palpation to ulcerated areas. No pain with compression to calves bilateral. Planus and digital deformity with rigid hallux extensus on right.   No results for input(s): GRAMSTAIN, LABORGA in the last 8760 hours.  Assessment and Plan:  Problem List Items Addressed This Visit    None    Visit Diagnoses    Ulcer of left foot, limited to breakdown of skin (HCC)    -  Primary   Toe ulcer, right, with fat layer exposed (HCC)       Abrasion       Diabetic polyneuropathy associated with type 2 diabetes mellitus (HCC)       PVD (peripheral vascular disease) (HCC)       Deformity of foot, unspecified laterality         -Examined patient and re-discussed the progression of the wound and treatment alternatives.  Excisionally dedbrided ulcerations bilateral to healthy bleeding borders removing nonviable tissue using a sterile chisel blade. Wound measure post debridement as above.  Wounds were debrided to the level of the dermis with viable wound  base exposed to promote healing. Hemostasis was achieved with manuel pressure. Patient tolerated procedure well without any discomfort or anesthesia necessary for this wound debridement.  -Applied medi-honey to all sites bilateral and offloading pads covered with dry sterile dressing and instructed patient to continue with daily dressings at home consisting of the same with assistance from [facility] with wound care orders as follows of Medihoney to the wounds 2x to 3x per week as previous at next visit if fails to continue to improve may consider adding a Oasis -May continue home PT to tolerance for gait and stability training  -Continue with  postop shoes -Advised patient to go to the ER or return to office if the wound worsens or if constitutional symptoms are present. -Patient to return to office as scheduled in 2-3 weeks for follow up care/wound evaluation or sooner if problems arise.  Asencion Islam, DPM

## 2017-07-11 DIAGNOSIS — L97412 Non-pressure chronic ulcer of right heel and midfoot with fat layer exposed: Secondary | ICD-10-CM | POA: Diagnosis not present

## 2017-07-11 DIAGNOSIS — L97422 Non-pressure chronic ulcer of left heel and midfoot with fat layer exposed: Secondary | ICD-10-CM | POA: Diagnosis not present

## 2017-07-11 DIAGNOSIS — M069 Rheumatoid arthritis, unspecified: Secondary | ICD-10-CM | POA: Diagnosis not present

## 2017-07-11 DIAGNOSIS — I10 Essential (primary) hypertension: Secondary | ICD-10-CM | POA: Diagnosis not present

## 2017-07-11 DIAGNOSIS — L97512 Non-pressure chronic ulcer of other part of right foot with fat layer exposed: Secondary | ICD-10-CM | POA: Diagnosis not present

## 2017-07-11 DIAGNOSIS — E11621 Type 2 diabetes mellitus with foot ulcer: Secondary | ICD-10-CM | POA: Diagnosis not present

## 2017-07-12 ENCOUNTER — Other Ambulatory Visit: Payer: Self-pay | Admitting: Podiatry

## 2017-07-12 DIAGNOSIS — R1900 Intra-abdominal and pelvic swelling, mass and lump, unspecified site: Secondary | ICD-10-CM | POA: Diagnosis not present

## 2017-07-12 DIAGNOSIS — K862 Cyst of pancreas: Secondary | ICD-10-CM | POA: Diagnosis not present

## 2017-07-12 DIAGNOSIS — N281 Cyst of kidney, acquired: Secondary | ICD-10-CM | POA: Diagnosis not present

## 2017-07-12 DIAGNOSIS — K8689 Other specified diseases of pancreas: Secondary | ICD-10-CM | POA: Diagnosis not present

## 2017-07-12 DIAGNOSIS — L97521 Non-pressure chronic ulcer of other part of left foot limited to breakdown of skin: Secondary | ICD-10-CM

## 2017-07-12 DIAGNOSIS — Z9049 Acquired absence of other specified parts of digestive tract: Secondary | ICD-10-CM | POA: Diagnosis not present

## 2017-07-12 DIAGNOSIS — M79672 Pain in left foot: Secondary | ICD-10-CM

## 2017-07-13 DIAGNOSIS — L97422 Non-pressure chronic ulcer of left heel and midfoot with fat layer exposed: Secondary | ICD-10-CM | POA: Diagnosis not present

## 2017-07-13 DIAGNOSIS — M069 Rheumatoid arthritis, unspecified: Secondary | ICD-10-CM | POA: Diagnosis not present

## 2017-07-13 DIAGNOSIS — I10 Essential (primary) hypertension: Secondary | ICD-10-CM | POA: Diagnosis not present

## 2017-07-13 DIAGNOSIS — E11621 Type 2 diabetes mellitus with foot ulcer: Secondary | ICD-10-CM | POA: Diagnosis not present

## 2017-07-13 DIAGNOSIS — L97412 Non-pressure chronic ulcer of right heel and midfoot with fat layer exposed: Secondary | ICD-10-CM | POA: Diagnosis not present

## 2017-07-13 DIAGNOSIS — L97512 Non-pressure chronic ulcer of other part of right foot with fat layer exposed: Secondary | ICD-10-CM | POA: Diagnosis not present

## 2017-07-14 ENCOUNTER — Other Ambulatory Visit: Payer: Self-pay | Admitting: Family Medicine

## 2017-07-14 DIAGNOSIS — S32010A Wedge compression fracture of first lumbar vertebra, initial encounter for closed fracture: Secondary | ICD-10-CM

## 2017-07-15 DIAGNOSIS — I10 Essential (primary) hypertension: Secondary | ICD-10-CM | POA: Diagnosis not present

## 2017-07-15 DIAGNOSIS — L97512 Non-pressure chronic ulcer of other part of right foot with fat layer exposed: Secondary | ICD-10-CM | POA: Diagnosis not present

## 2017-07-15 DIAGNOSIS — M069 Rheumatoid arthritis, unspecified: Secondary | ICD-10-CM | POA: Diagnosis not present

## 2017-07-15 DIAGNOSIS — L97412 Non-pressure chronic ulcer of right heel and midfoot with fat layer exposed: Secondary | ICD-10-CM | POA: Diagnosis not present

## 2017-07-15 DIAGNOSIS — L97422 Non-pressure chronic ulcer of left heel and midfoot with fat layer exposed: Secondary | ICD-10-CM | POA: Diagnosis not present

## 2017-07-15 DIAGNOSIS — E11621 Type 2 diabetes mellitus with foot ulcer: Secondary | ICD-10-CM | POA: Diagnosis not present

## 2017-07-18 DIAGNOSIS — L97512 Non-pressure chronic ulcer of other part of right foot with fat layer exposed: Secondary | ICD-10-CM | POA: Diagnosis not present

## 2017-07-18 DIAGNOSIS — E11621 Type 2 diabetes mellitus with foot ulcer: Secondary | ICD-10-CM | POA: Diagnosis not present

## 2017-07-18 DIAGNOSIS — L97412 Non-pressure chronic ulcer of right heel and midfoot with fat layer exposed: Secondary | ICD-10-CM | POA: Diagnosis not present

## 2017-07-18 DIAGNOSIS — I10 Essential (primary) hypertension: Secondary | ICD-10-CM | POA: Diagnosis not present

## 2017-07-18 DIAGNOSIS — M069 Rheumatoid arthritis, unspecified: Secondary | ICD-10-CM | POA: Diagnosis not present

## 2017-07-18 DIAGNOSIS — L97422 Non-pressure chronic ulcer of left heel and midfoot with fat layer exposed: Secondary | ICD-10-CM | POA: Diagnosis not present

## 2017-07-20 DIAGNOSIS — B999 Unspecified infectious disease: Secondary | ICD-10-CM | POA: Diagnosis not present

## 2017-07-20 DIAGNOSIS — Z886 Allergy status to analgesic agent status: Secondary | ICD-10-CM | POA: Diagnosis not present

## 2017-07-20 DIAGNOSIS — Z872 Personal history of diseases of the skin and subcutaneous tissue: Secondary | ICD-10-CM | POA: Diagnosis not present

## 2017-07-20 DIAGNOSIS — M069 Rheumatoid arthritis, unspecified: Secondary | ICD-10-CM | POA: Diagnosis not present

## 2017-07-20 DIAGNOSIS — I10 Essential (primary) hypertension: Secondary | ICD-10-CM | POA: Diagnosis not present

## 2017-07-20 DIAGNOSIS — Z8709 Personal history of other diseases of the respiratory system: Secondary | ICD-10-CM | POA: Diagnosis not present

## 2017-07-20 DIAGNOSIS — E119 Type 2 diabetes mellitus without complications: Secondary | ICD-10-CM | POA: Diagnosis not present

## 2017-07-20 DIAGNOSIS — L97509 Non-pressure chronic ulcer of other part of unspecified foot with unspecified severity: Secondary | ICD-10-CM | POA: Diagnosis not present

## 2017-07-20 DIAGNOSIS — E1122 Type 2 diabetes mellitus with diabetic chronic kidney disease: Secondary | ICD-10-CM | POA: Diagnosis not present

## 2017-07-20 DIAGNOSIS — Z882 Allergy status to sulfonamides status: Secondary | ICD-10-CM | POA: Diagnosis not present

## 2017-07-20 DIAGNOSIS — Z6824 Body mass index (BMI) 24.0-24.9, adult: Secondary | ICD-10-CM | POA: Diagnosis not present

## 2017-07-20 DIAGNOSIS — Z87440 Personal history of urinary (tract) infections: Secondary | ICD-10-CM | POA: Diagnosis not present

## 2017-07-20 DIAGNOSIS — M810 Age-related osteoporosis without current pathological fracture: Secondary | ICD-10-CM | POA: Diagnosis not present

## 2017-07-20 DIAGNOSIS — Z91040 Latex allergy status: Secondary | ICD-10-CM | POA: Diagnosis not present

## 2017-07-20 DIAGNOSIS — D803 Selective deficiency of immunoglobulin G [IgG] subclasses: Secondary | ICD-10-CM | POA: Diagnosis not present

## 2017-07-20 DIAGNOSIS — Z7952 Long term (current) use of systemic steroids: Secondary | ICD-10-CM | POA: Diagnosis not present

## 2017-07-20 DIAGNOSIS — N183 Chronic kidney disease, stage 3 (moderate): Secondary | ICD-10-CM | POA: Diagnosis not present

## 2017-07-20 DIAGNOSIS — M8589 Other specified disorders of bone density and structure, multiple sites: Secondary | ICD-10-CM | POA: Diagnosis not present

## 2017-07-20 DIAGNOSIS — L97412 Non-pressure chronic ulcer of right heel and midfoot with fat layer exposed: Secondary | ICD-10-CM | POA: Diagnosis not present

## 2017-07-20 DIAGNOSIS — R634 Abnormal weight loss: Secondary | ICD-10-CM | POA: Diagnosis not present

## 2017-07-20 DIAGNOSIS — L97512 Non-pressure chronic ulcer of other part of right foot with fat layer exposed: Secondary | ICD-10-CM | POA: Diagnosis not present

## 2017-07-20 DIAGNOSIS — E11621 Type 2 diabetes mellitus with foot ulcer: Secondary | ICD-10-CM | POA: Diagnosis not present

## 2017-07-20 DIAGNOSIS — R768 Other specified abnormal immunological findings in serum: Secondary | ICD-10-CM | POA: Diagnosis not present

## 2017-07-20 DIAGNOSIS — Z794 Long term (current) use of insulin: Secondary | ICD-10-CM | POA: Diagnosis not present

## 2017-07-20 DIAGNOSIS — L97422 Non-pressure chronic ulcer of left heel and midfoot with fat layer exposed: Secondary | ICD-10-CM | POA: Diagnosis not present

## 2017-07-21 ENCOUNTER — Ambulatory Visit
Admission: RE | Admit: 2017-07-21 | Discharge: 2017-07-21 | Disposition: A | Discharge status: Home / Self Care | Payer: Medicare Other | Source: Ambulatory Visit | Attending: Family Medicine | Admitting: Family Medicine

## 2017-07-21 ENCOUNTER — Encounter: Payer: Self-pay | Admitting: Radiology

## 2017-07-21 ENCOUNTER — Ambulatory Visit
Admission: RE | Admit: 2017-07-21 | Discharge: 2017-07-21 | Disposition: A | Discharge status: Home / Self Care | Payer: Self-pay | Source: Ambulatory Visit | Attending: Family Medicine | Admitting: Family Medicine

## 2017-07-21 ENCOUNTER — Other Ambulatory Visit: Payer: Self-pay | Admitting: Family Medicine

## 2017-07-21 DIAGNOSIS — S32010A Wedge compression fracture of first lumbar vertebra, initial encounter for closed fracture: Secondary | ICD-10-CM

## 2017-07-21 HISTORY — PX: IR RADIOLOGIST EVAL & MGMT: IMG5224

## 2017-07-21 NOTE — Consult Note (Signed)
Chief Complaint: Symptomatic compression fractures  Referring Physician(s): Scott,Robert B  History of Present Illness: [name] is a 77 y.o. female with complex past medical history significant for hypertension, hyperlipidemia, insulin-dependent diabetes, stroke (suffered in December of this past year with mild residual right-sided vision loss and dysarthria) and severe rheumatoid arthritis who was in her baseline state of fairly poor health when she suffered a fall on January 21 of this year. Subsequent imaging demonstrates findings worrisome for a compression fracture and as such, she presents to the interventional radiology clinic for evaluation of candidacy for potential vertebral body cement agumentation. The patient is accompanied by her daughter though serves as her own historian.  As above, patient has severe rheumatoid arthritis with windswept deformities of the bilateral hands. Patient suffers from chronic back pain which she describes as as nearly constant which she rates as possibly 5-6 out of 10.  Unfortunately, after suffering the fall in January of this year her pain has significant late worsening, currently in approximately 8-9 out of 10 at all times, worse with activity. Patient is on scheduled oxycodone and morphine which she takes every 4 hours.  Additionally, the patient takes steroids, currently on 10 mg of prednisone.  Note, patient has undergone attempted previous spinal injections however her most recent injection was unable to be performed secondary to her inability to be positioned for the procedure.  Patient states that her back pain is rather diffuse though most severe within her lumbar spine radiating to her right hip and thigh. Patient denies change in bowel or bladder function. She denies paresthesias or weakness.  He does admit to right-sided chest pressure and mild shortness of breath, likely secondary to her known moderate sized right-sided pleural  effusion. No chest pain.    Past Medical History:  Diagnosis Date  . HTN (hypertension)   . Hyperlipidemia   . Hypothyroidism   . Insulin dependent diabetes mellitus (HCC)   . Obesity   . Stroke [facility])     Past Surgical History:  Procedure Laterality Date  . abnormal lexiscan     at [facility] on October 20,2011. demonstrating small area of ischemia in the midportion of the anterior septal wall with preserved left ventricular ejection fraction  . APPENDECTOMY    . Biteral knee surgery    . CHOLECYSTECTOMY    . EYE SURGERY     as a child  . HAND SURGERY     Right  . IR RADIOLOGIST EVAL & MGMT  07/21/2017  . LUNG SURGERY     x 2  . Nodule removal    . WRIST SURGERY      Allergies: Gold; Nsaids; Sulfa antibiotics; Sulfonamide derivatives; Latex; and Sulfamethoxazole  Medications: Prior to Admission medications   Medication Sig Start Date End Date Taking? Authorizing Provider  amLODipine (NORVASC) 2.5 MG tablet Take 1 tablet (2.5 mg total) by mouth daily. 06/13/17 09/11/17 Yes Duke Salvia, MD  aspirin 81 MG chewable tablet Chew 81 mg by mouth daily.   Yes [provider]  Cholecalciferol (VITAMIN D3) 2000 units TABS Take 1 tablet by mouth daily.   Yes [provider]  cloNIDine (CATAPRES) 0.1 MG tablet Take 1 tablet by mouth Three times a day. 01/19/12  Yes [provider]  FREESTYLE LITE test strip  12/14/10  Yes [provider]  furosemide (LASIX) 40 MG tablet Take 40 mg by mouth daily.     Yes [provider]  gabapentin (NEURONTIN) 100  MG capsule Take 100 mg by mouth 2 (two) times daily.   Yes [provider]  insulin NPH-insulin regular (NOVOLIN 70/30) (70-30) 100 UNIT/ML injection Inject 39 units into the skin each morning and 6 units into the skin each evening.   Yes [provider]  lansoprazole (PREVACID) 30 MG capsule Take 30 mg by mouth daily.     Yes [provider]  leflunomide  (ARAVA) 10 MG tablet Take 10 mg by mouth daily.   Yes [provider]  levothyroxine (SYNTHROID, LEVOTHROID) 75 MCG tablet Take 75 mcg by mouth daily.     Yes [provider]  lidocaine (LIDODERM) 5 % UNW AND APP 1 PA TO SKIN UP TO 16 H PER DAY 06/20/17  Yes [provider]  metFORMIN (GLUCOPHAGE) 1000 MG tablet Take 500 mg by mouth 3 (three) times daily. Take 1/2 tab twice a day   Yes [provider]  morphine (MS CONTIN) 15 MG 12 hr tablet Take by mouth. 06/18/17  Yes [provider]  Multiple Vitamin (MULTIVITAMIN) tablet Take 1 tablet by mouth daily.   Yes [provider]  mupirocin ointment (BACTROBAN) 2 % To right toe ulcer 12/17/16  Yes Stover, Titorya, DPM  oxyCODONE (OXY IR/ROXICODONE) 5 MG immediate release tablet Take 5 mg by mouth 3 (three) times daily.    Yes [provider]  potassium chloride (KLOR-CON 10) 10 MEQ CR tablet Take 10 mEq by mouth 2 (two) times daily. When taking furosemide    Yes [provider]  predniSONE (DELTASONE) 5 MG tablet Take 5 mg by mouth 2 (two) times daily.    Yes [provider]  spironolactone (ALDACTONE) 25 MG tablet Take 25 mg by mouth 2 (two) times daily. 10/03/12  Yes [provider]  B-D ULTRAFINE III SHORT PEN 31G X 8 MM MISC  12/23/10   [provider]  diazepam (VALIUM) 5 MG tablet Take 5 mg by mouth 3 (three) times daily.      [provider]  oxyCODONE (OXYCONTIN) 40 MG 12 hr tablet Take 40 mg by mouth every 8 (eight) hours.      [provider]  rosuvastatin (CRESTOR) 5 MG tablet Take 0.5 tablets (2.5 mg total) by mouth daily. Patient not taking: Reported on 07/21/2017 06/27/17   Micki Riley, MD     Family History  Problem Relation Age of Onset  . Lymphoma Mother   . Stroke Mother        questionable  . Heart attack Father 42    Social History   Socioeconomic History  . Marital status: Married    Spouse name: Not on file    . Number of children: 2  . Years of education: Not on file  . Highest education level: Not on file  Occupational History  . Not on file  Social Needs  . Financial resource strain: Not on file  . Food insecurity:    Worry: Not on file    Inability: Not on file  . Transportation needs:    Medical: Not on file    Non-medical: Not on file  Tobacco Use  . Smoking status: Never Smoker  . Smokeless tobacco: Never Used  Substance and Sexual Activity  . Alcohol use: No  . Drug use: No  . Sexual activity: Not on file  Lifestyle  . Physical activity:    Days per week: Not on file    Minutes per session: Not on file  .  Stress: Not on file  Relationships  . Social connections:    Talks on phone: Not on file    Gets together: Not on file    Attends religious service: Not on file    Active member of club or organization: Not on file    Attends meetings of clubs or organizations: Not on file    Relationship status: Not on file  Other Topics Concern  . Not on file  Social History Narrative   Lives in [place] with her husband.    ECOG Status: 2 - Symptomatic, <50% confined to bed  Review of Systems: A 12 point ROS discussed and pertinent positives are indicated in the HPI above.  All other systems are negative.  Review of Systems  Constitutional: Positive for activity change. Negative for fever.  Respiratory: Positive for chest tightness and shortness of breath.   Cardiovascular: Negative.   Musculoskeletal: Positive for arthralgias, back pain, gait problem, joint swelling and myalgias.    Vital Signs: BP 137/63   Pulse (!) 48   Temp (!) 97.4 F (36.3 C) (Oral)   Resp 14   Ht 4\' 11"  (1.499 m)   Wt 121 lb (54.9 kg)   LMP  (LMP Unknown)   SpO2 99%   BMI 24.44 kg/m   Physical Exam  Constitutional:  Appears very frail.  HENT:  Head: Normocephalic and atraumatic.  Nursing note and vitals reviewed.   Imaging:  Chest CT -  05/02/2017; CT abdomen and pelvis -  07/02/2017; 07/15/2005 -  CT scan of the abdomen and pelvis performed 07/02/2017 and demonstrates a moderate (approximately 50%) suspected subacute fracture involving the superior endplate of the L3 vertebral body with associated increased sclerosis.  Redemonstrated moderate (approximately 40%) compression deformity involving T9, similar to chest CT performed 05/02/2017.  The chest CT also demonstrated an age indeterminate moderate (approximate 40%) compression depression involving superior endplate of T5.  More importantly, both the abdominal and chest CT demonstrated multiple heterogeneously enhancing masses within the right pleural space with associated moderate-sized right-sided pleural effusion. Dominant enhancing pleural mass measures approximately 5.1 x 3.4 cm.  These findings have developed/significantantly progressed since remote abdominal CT performed 07/15/2005.    Dg Foot Complete Left  Result Date: 07/12/2017 Please see detailed radiograph report in office note.  Ir Radiologist Eval & Mgmt  Result Date: 07/21/2017 Please refer to notes tab for details about interventional procedure. (Op Note)  Mr Outside Films Spine  Result Date: 07/21/2017 This examination belongs to an outside facility and is stored here for comparison purposes only.  Contact the originating outside institution for any associated report or interpretation.   Labs:  CBC: No results for input(s): WBC, HGB, HCT, PLT in the last 8760 hours.  COAGS: No results for input(s): INR, APTT in the last 8760 hours.  BMP: No results for input(s): NA, K, CL, CO2, GLUCOSE, BUN, CALCIUM, CREATININE, GFRNONAA, GFRAA in the last 8760 hours.  Invalid input(s): CMP  LIVER FUNCTION TESTS: No results for input(s): BILITOT, AST, ALT, ALKPHOS, PROT, ALBUMIN in the last 8760 hours.  TUMOR MARKERS: No results for input(s): AFPTM, CEA, CA199, CHROMGRNA in the last 8760 hours.  Assessment and Plan:  [name] is a 77  y.o. female with complex past medical history significant for hypertension, hyperlipidemia, insulin-dependent diabetes, stroke (suffered in December of this past year with mild residual right-sided vision loss and dysarthria) and severe rheumatoid arthritis who was in her baseline state of fairly poor health when she suffered a  fall on January 21 of this year.   CT scan of the abdomen and pelvis performed 07/02/2017 demonstrates a moderate (approximately 50%) suspected subacute fracture involving the superior endplate of the L3 vertebral body with associated increased sclerosis.    Additionally, there is a moderate (approximately 40%) compression deformity involving T9, similar to chest CT performed 05/02/2017, therefore likely chronic in etiology.    The chest CT also demonstrated an age indeterminate moderate (approximate 40%) compression depression involving superior endplate of T5.  More importantly, both the abdominal and chest CT demonstrated multiple heterogeneously enhancing masses within the right pleural space with associated moderate-sized right-sided pleural effusion. Dominant enhancing pleural mass measures approximately 5.1 x 3.4 cm.  These findings have developed/significantantly progressed since remote abdominal CT performed 07/15/2005.  While I am certain the patient's back pain is likely at least partially attributable to her compression deformity(s), at the present time, the patient requires further evaluation regarding the enlarging heterogeneous enhancing nodules in the right pleural space with associated moderate-sized pleural effusion.  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    Per the patient's daughter, the patient has a consultation with  [facility], Dr. Bettey Mare on April 12.  Fortunately, Dr. Bettey Mare attends our Thoracic surgery, conference at [facility] and as such, I will confirm with thoracic oncology nurse manager, Georgena Spurling, Dr. Bettey Mare has all of the necessary imaging prior to this appointment.  Additionally, if desired, I will arrange for the patient undergo ultrasound-guided thoracentesis and potential percutaneous biopsy pending Dr. Ursula Beath recommendation.  If desired, these procedures will be performed at [facility].  Once the management of the pleural masses is established, further workup of the patient's back pain would entail a contrast enhanced thoracic and lumbar spine MRI, both to better evaluate the chronicity of the patient's compression deformities as well as to evaluate for osseous metastatic disease.  Preliminary conversations were held with the patient and the patient's daughter regarding the risks and benefits of fluoroscopic guided cement augmentation and potential Osteocool were discussed with the patient including, but not limited to education regarding the natural healing process of compression fractures without intervention, bleeding, infection, cement migration which may cause spinal cord damage, paralysis, pulmonary embolism or even death.  Admittedly, I am uncertain whether the patient will ultimately be a candidate for vertebral body cement augmentation given my concern of her ability to lie prone, necessary for either a kyphoplasty or OsteoCool procedure.  Plan of care: - Coordinate with Georgena Spurling to ensure Dr. Bettey Mare [facility] thoracic surgery) as all of the necessary imaging prior to appointment scheduled for April 12.  - Pending the ultimate plan of care regarding the patient's pleural masses, would consider obtaining contrast enhanced thoracic and lumbar spine MRI though admittedly I am uncertain whether the patient will ultimately be a candidate  for either a kyphoplasty or OsteoCool procedure given her inability to lie prone on the fluoroscopy table. If vertebral body augmentation is pursued it will be performed at [facility] on an outpatient basis.   Thank you for this interesting consult.  I greatly enjoyed meeting [name] and look forward to participating in their care.  A copy of this report was sent to the requesting provider on this date.  Electronically Signed: Simonne Come  07/21/2017, 12:59 PM   I spent a total of 30 Minutes in face to face in clinical consultation, greater than 50% of which was counseling/coordinating care for symptomatic compression fractures.

## 2017-07-22 ENCOUNTER — Telehealth: Payer: Self-pay | Admitting: Interventional Radiology

## 2017-07-22 DIAGNOSIS — I10 Essential (primary) hypertension: Secondary | ICD-10-CM | POA: Diagnosis not present

## 2017-07-22 DIAGNOSIS — E11621 Type 2 diabetes mellitus with foot ulcer: Secondary | ICD-10-CM | POA: Diagnosis not present

## 2017-07-22 DIAGNOSIS — L97412 Non-pressure chronic ulcer of right heel and midfoot with fat layer exposed: Secondary | ICD-10-CM | POA: Diagnosis not present

## 2017-07-22 DIAGNOSIS — L97422 Non-pressure chronic ulcer of left heel and midfoot with fat layer exposed: Secondary | ICD-10-CM | POA: Diagnosis not present

## 2017-07-22 DIAGNOSIS — L97512 Non-pressure chronic ulcer of other part of right foot with fat layer exposed: Secondary | ICD-10-CM | POA: Diagnosis not present

## 2017-07-22 DIAGNOSIS — M069 Rheumatoid arthritis, unspecified: Secondary | ICD-10-CM | POA: Diagnosis not present

## 2017-07-22 NOTE — Telephone Encounter (Signed)
As promised to the pt and the pt's daughter, I spoke with Georgena Spurling [phone]), Nurse Coordinator for Thoracic Oncology at [facility] on their behalf.  She will get in touch with Dr. Mordecai Maes [facility] Thoracic Surgery) in regards to the pt's most recent imaging and to determine if a PET CT, thora and/or biopsy is warranted prior to her appointment scheduled for mid April.  IF these exams/procedures are desired, they can be arranged at [facility].  Katherina Right, MD Pager #: [phone]

## 2017-07-25 DIAGNOSIS — L97512 Non-pressure chronic ulcer of other part of right foot with fat layer exposed: Secondary | ICD-10-CM | POA: Diagnosis not present

## 2017-07-25 DIAGNOSIS — I10 Essential (primary) hypertension: Secondary | ICD-10-CM | POA: Diagnosis not present

## 2017-07-25 DIAGNOSIS — M069 Rheumatoid arthritis, unspecified: Secondary | ICD-10-CM | POA: Diagnosis not present

## 2017-07-25 DIAGNOSIS — L97422 Non-pressure chronic ulcer of left heel and midfoot with fat layer exposed: Secondary | ICD-10-CM | POA: Diagnosis not present

## 2017-07-25 DIAGNOSIS — L97412 Non-pressure chronic ulcer of right heel and midfoot with fat layer exposed: Secondary | ICD-10-CM | POA: Diagnosis not present

## 2017-07-25 DIAGNOSIS — E11621 Type 2 diabetes mellitus with foot ulcer: Secondary | ICD-10-CM | POA: Diagnosis not present

## 2017-07-27 DIAGNOSIS — L97412 Non-pressure chronic ulcer of right heel and midfoot with fat layer exposed: Secondary | ICD-10-CM | POA: Diagnosis not present

## 2017-07-27 DIAGNOSIS — I10 Essential (primary) hypertension: Secondary | ICD-10-CM | POA: Diagnosis not present

## 2017-07-27 DIAGNOSIS — L97512 Non-pressure chronic ulcer of other part of right foot with fat layer exposed: Secondary | ICD-10-CM | POA: Diagnosis not present

## 2017-07-27 DIAGNOSIS — M069 Rheumatoid arthritis, unspecified: Secondary | ICD-10-CM | POA: Diagnosis not present

## 2017-07-27 DIAGNOSIS — E11621 Type 2 diabetes mellitus with foot ulcer: Secondary | ICD-10-CM | POA: Diagnosis not present

## 2017-07-27 DIAGNOSIS — L97422 Non-pressure chronic ulcer of left heel and midfoot with fat layer exposed: Secondary | ICD-10-CM | POA: Diagnosis not present

## 2017-07-29 ENCOUNTER — Encounter: Payer: Self-pay | Admitting: Sports Medicine

## 2017-07-29 ENCOUNTER — Ambulatory Visit (INDEPENDENT_AMBULATORY_CARE_PROVIDER_SITE_OTHER): Payer: Medicare Other | Admitting: Sports Medicine

## 2017-07-29 DIAGNOSIS — J90 Pleural effusion, not elsewhere classified: Secondary | ICD-10-CM | POA: Diagnosis not present

## 2017-07-29 DIAGNOSIS — M21969 Unspecified acquired deformity of unspecified lower leg: Secondary | ICD-10-CM

## 2017-07-29 DIAGNOSIS — E039 Hypothyroidism, unspecified: Secondary | ICD-10-CM | POA: Diagnosis not present

## 2017-07-29 DIAGNOSIS — L97521 Non-pressure chronic ulcer of other part of left foot limited to breakdown of skin: Secondary | ICD-10-CM | POA: Diagnosis not present

## 2017-07-29 DIAGNOSIS — M069 Rheumatoid arthritis, unspecified: Secondary | ICD-10-CM | POA: Diagnosis not present

## 2017-07-29 DIAGNOSIS — I739 Peripheral vascular disease, unspecified: Secondary | ICD-10-CM

## 2017-07-29 DIAGNOSIS — E1142 Type 2 diabetes mellitus with diabetic polyneuropathy: Secondary | ICD-10-CM

## 2017-07-29 DIAGNOSIS — R918 Other nonspecific abnormal finding of lung field: Secondary | ICD-10-CM | POA: Diagnosis not present

## 2017-07-29 DIAGNOSIS — L97512 Non-pressure chronic ulcer of other part of right foot with fat layer exposed: Secondary | ICD-10-CM

## 2017-07-29 DIAGNOSIS — E119 Type 2 diabetes mellitus without complications: Secondary | ICD-10-CM | POA: Diagnosis not present

## 2017-07-29 DIAGNOSIS — Z9889 Other specified postprocedural states: Secondary | ICD-10-CM | POA: Diagnosis not present

## 2017-07-29 DIAGNOSIS — Z902 Acquired absence of lung [part of]: Secondary | ICD-10-CM | POA: Diagnosis not present

## 2017-07-29 DIAGNOSIS — Z79899 Other long term (current) drug therapy: Secondary | ICD-10-CM | POA: Diagnosis not present

## 2017-07-29 DIAGNOSIS — T148XXA Other injury of unspecified body region, initial encounter: Secondary | ICD-10-CM

## 2017-07-29 DIAGNOSIS — I10 Essential (primary) hypertension: Secondary | ICD-10-CM | POA: Diagnosis not present

## 2017-07-29 NOTE — Progress Notes (Signed)
Subjective: [name] is a 77 y.o. female patient seen in office for follow up evaluation of ulcerations bilateral.  Patient has home nursing weekly, denies nausea/fever/vomiting/chills/night sweats/shortness of breath/chest pain.  Patient has no other pedal complaints at this time.  Patient is assisted by daughter this visit.  Patient Active Problem List   Diagnosis Date Noted  . 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 PAF (paroxysmal atrial fibrillation) (HCC) 06/27/2017  . Coronary artery disease 12/08/2010  . Dyspnea 12/08/2010  . HYPERTENSION, BENIGN 04/01/2010  . RHEUMATOID ARTHRITIS 04/01/2010  . ABNORMAL CV (STRESS) TEST 04/01/2010  . LEG PAIN, RIGHT 03/12/2010   Current Outpatient Medications on File Prior to Visit  Medication Sig Dispense Refill  . amLODipine (NORVASC) 2.5 MG tablet Take 1 tablet (2.5 mg total) by mouth daily. 90 tablet 3  . aspirin 81 MG chewable tablet Chew 81 mg by mouth daily.    . B-D ULTRAFINE III SHORT PEN 31G X 8 MM MISC     . Cholecalciferol (VITAMIN D3) 2000 units TABS Take 1 tablet by mouth daily.    . cloNIDine (CATAPRES) 0.1 MG tablet Take 1 tablet by mouth Three times a day.    . diazepam (VALIUM) 5 MG tablet Take 5 mg by mouth 3 (three) times daily.      Marland Kitchen FREESTYLE LITE test strip     . furosemide (LASIX) 40 MG tablet Take 40 mg by mouth daily.      Marland Kitchen gabapentin (NEURONTIN) 100 MG capsule Take 100 mg by mouth 2 (two) times daily.    . insulin NPH-insulin regular (NOVOLIN 70/30) (70-30) 100 UNIT/ML injection Inject 39 units into the skin each morning and 6 units into the skin each evening.    . lansoprazole (PREVACID) 30 MG capsule Take 30 mg by mouth daily.      Marland Kitchen leflunomide (ARAVA) 10 MG tablet Take 10 mg by mouth daily.    Marland Kitchen levothyroxine (SYNTHROID, LEVOTHROID) 75 MCG tablet Take 75 mcg by mouth daily.      Marland Kitchen lidocaine (LIDODERM) 5 % UNW AND APP 1 PA TO SKIN UP TO 16 H PER DAY  6  . metFORMIN (GLUCOPHAGE) 1000 MG tablet Take 500 mg  by mouth 3 (three) times daily. Take 1/2 tab twice a day    . morphine (MS CONTIN) 15 MG 12 hr tablet Take by mouth.    . Multiple Vitamin (MULTIVITAMIN) tablet Take 1 tablet by mouth daily.    . mupirocin ointment (BACTROBAN) 2 % To right toe ulcer 30 g 0  . oxyCODONE (OXY IR/ROXICODONE) 5 MG immediate release tablet Take 5 mg by mouth 3 (three) times daily.     Marland Kitchen oxyCODONE (OXYCONTIN) 40 MG 12 hr tablet Take 40 mg by mouth every 8 (eight) hours.      . potassium chloride (KLOR-CON 10) 10 MEQ CR tablet Take 10 mEq by mouth 2 (two) times daily. When taking furosemide     . predniSONE (DELTASONE) 5 MG tablet Take 5 mg by mouth 2 (two) times daily.     . rosuvastatin (CRESTOR) 5 MG tablet Take 0.5 tablets (2.5 mg total) by mouth daily. (Patient not taking: Reported on 07/21/2017) 30 tablet 3  . spironolactone (ALDACTONE) 25 MG tablet Take 25 mg by mouth 2 (two) times daily.     Current Facility-Administered Medications on File Prior to Visit  Medication Dose Route Frequency Provider Last Rate Last Dose  . apixaban (ELIQUIS) tablet 5 mg  5 mg Oral BID Pearlean Brownie,  Jason Fila, MD       Allergies  Allergen Reactions  . Gold Anaphylaxis    Swelling of lips/tongue, throat Swelling of lips/tongue, throat  . Nsaids Other (See Comments)    Intolerance Intolerance  . Sulfa Antibiotics Nausea Only  . Sulfonamide Derivatives   . Latex Other (See Comments)    blisters  . Sulfamethoxazole Nausea Only    Recent Results (from the past 2160 hour(s))  WOUND CULTURE     Status: None   Collection Time: 05/10/17  3:15 PM  Result Value Ref Range   Gram Stain Result CANCELED     Comment: The specimen submitted does not meet the laboratory's criteria for acceptability. Refer to [employer] [facility] for specimen acceptability criteria.  Result canceled by the ancillary.    Aerobic Bacterial Culture Final report    Organism ID, Bacteria Mixed skin flora     Comment: Heavy growth     Objective: There were no vitals filed for this visit.  General: Patient is awake, alert, oriented x 3 and in no acute distress.  Dermatology: Skin is warm and dry bilateral with a partial thickness ulceration present right plantar hallux distal ulceration that measures 0.3cm x 0.2cm x 0.2cm (similar to previous) there is a keratotic border with a fibrogranular base. The ulceration does not probe to bone. There is no malodor, no active drainage, decreased erythema, decreased edema. No other acute signs of infection.   To lateral right fifth metatarsal is healed over ulcer continues to be pre-ulcerative in nature.  To the right dorsal midfoot is an healed over continues to be pre-ulcerative in nature.  To the left foot at the third toe there is healed over continues to be pre-ulcerative in nature.  To the plantar left forefoot there is a ulceration that measures 0.8 cm x 0.8 cm x 0.5 cm (larger than previous) probes to the joint capsule with a granular base and mildly keratotic margins with no acute signs of infection.  Vascular: Dorsalis Pedis pulse = 1/4 Bilateral,  Posterior Tibial pulse = 0/4 Bilateral,  Capillary Fill Time < 5 seconds. Chronic venous skin changes.  Neurologic: Protective sensation absent to the level of the ankles bilateral using  the 5.07/10g [employer].  Musculosketal: No Pain with palpation to ulcerated areas. No pain with compression to calves bilateral. Planus and digital deformity with rigid hallux extensus on right.   No results for input(s): GRAMSTAIN, LABORGA in the last 8760 hours.  Assessment and Plan:  Problem List Items Addressed This Visit    None    Visit Diagnoses    Ulcer of left foot, limited to breakdown of skin (HCC)    -  Primary   Toe ulcer, right, with fat layer exposed (HCC)       Abrasion       Diabetic polyneuropathy associated with type 2 diabetes mellitus (HCC)       PVD (peripheral vascular disease) (HCC)        Deformity of foot, unspecified laterality         -Examined patient and re-discussed the progression of the wound and treatment alternatives.  Excisionally dedbrided ulcerations bilateral to healthy bleeding borders removing nonviable tissue using a sterile chisel blade. Wound measure post debridement as above.  Wounds were debrided to the level of the dermis with viable wound base exposed to promote healing. Hemostasis was achieved with manuel pressure. Patient tolerated procedure well without any discomfort or anesthesia necessary for this wound  debridement.  -Applied medi-honey to all sites bilateral and offloading pads covered with dry sterile dressing and instructed patient to continue with daily dressings at home consisting of the same with assistance from [facility] with wound care orders as follows of Medihoney to the wounds 2x to 3x per week as previous until she receives the Regranex medication that I ordered at this visit; Faxed Rx to Regranex360 with patient's demographics -Continue home PT to tolerance for gait and stability training  -Continue with postop shoes -Advised patient to go to the ER or return to office if the wound worsens or if constitutional symptoms are present. -Patient to return to office as scheduled in 2-3 weeks for follow up care/wound evaluation or sooner if problems arise.  Asencion Islam, DPM

## 2017-08-01 DIAGNOSIS — M069 Rheumatoid arthritis, unspecified: Secondary | ICD-10-CM | POA: Diagnosis not present

## 2017-08-01 DIAGNOSIS — L97512 Non-pressure chronic ulcer of other part of right foot with fat layer exposed: Secondary | ICD-10-CM | POA: Diagnosis not present

## 2017-08-01 DIAGNOSIS — E11621 Type 2 diabetes mellitus with foot ulcer: Secondary | ICD-10-CM | POA: Diagnosis not present

## 2017-08-01 DIAGNOSIS — I10 Essential (primary) hypertension: Secondary | ICD-10-CM | POA: Diagnosis not present

## 2017-08-01 DIAGNOSIS — L97412 Non-pressure chronic ulcer of right heel and midfoot with fat layer exposed: Secondary | ICD-10-CM | POA: Diagnosis not present

## 2017-08-01 DIAGNOSIS — L97422 Non-pressure chronic ulcer of left heel and midfoot with fat layer exposed: Secondary | ICD-10-CM | POA: Diagnosis not present

## 2017-08-03 DIAGNOSIS — M069 Rheumatoid arthritis, unspecified: Secondary | ICD-10-CM | POA: Diagnosis not present

## 2017-08-03 DIAGNOSIS — L97422 Non-pressure chronic ulcer of left heel and midfoot with fat layer exposed: Secondary | ICD-10-CM | POA: Diagnosis not present

## 2017-08-03 DIAGNOSIS — E11621 Type 2 diabetes mellitus with foot ulcer: Secondary | ICD-10-CM | POA: Diagnosis not present

## 2017-08-03 DIAGNOSIS — L97512 Non-pressure chronic ulcer of other part of right foot with fat layer exposed: Secondary | ICD-10-CM | POA: Diagnosis not present

## 2017-08-03 DIAGNOSIS — I10 Essential (primary) hypertension: Secondary | ICD-10-CM | POA: Diagnosis not present

## 2017-08-03 DIAGNOSIS — L97412 Non-pressure chronic ulcer of right heel and midfoot with fat layer exposed: Secondary | ICD-10-CM | POA: Diagnosis not present

## 2017-08-04 ENCOUNTER — Telehealth: Payer: Self-pay | Admitting: Sports Medicine

## 2017-08-04 NOTE — Telephone Encounter (Signed)
Received fax denying the regranex. Sent PEER TO PEER [phone] to Dr. Marylene Land.

## 2017-08-04 NOTE — Telephone Encounter (Signed)
I called [facility] and asked to have the denial letter faxed again.

## 2017-08-04 NOTE — Telephone Encounter (Signed)
Hi, this is Jasmine calling from pt support on a recorded line. I'm calling in regards to the Regranex gel order. The prior authorization was denied, so we are calling to follow up to see if you guys have received that letter. We just wanted to know if you were going to do an appeal or not. If you would please give [place] a call at [phone] option 2. Thank you.

## 2017-08-05 ENCOUNTER — Telehealth: Payer: Self-pay | Admitting: Sports Medicine

## 2017-08-05 DIAGNOSIS — I10 Essential (primary) hypertension: Secondary | ICD-10-CM | POA: Diagnosis not present

## 2017-08-05 DIAGNOSIS — L97412 Non-pressure chronic ulcer of right heel and midfoot with fat layer exposed: Secondary | ICD-10-CM | POA: Diagnosis not present

## 2017-08-05 DIAGNOSIS — L97512 Non-pressure chronic ulcer of other part of right foot with fat layer exposed: Secondary | ICD-10-CM | POA: Diagnosis not present

## 2017-08-05 DIAGNOSIS — M069 Rheumatoid arthritis, unspecified: Secondary | ICD-10-CM | POA: Diagnosis not present

## 2017-08-05 DIAGNOSIS — L97422 Non-pressure chronic ulcer of left heel and midfoot with fat layer exposed: Secondary | ICD-10-CM | POA: Diagnosis not present

## 2017-08-05 DIAGNOSIS — E11621 Type 2 diabetes mellitus with foot ulcer: Secondary | ICD-10-CM | POA: Diagnosis not present

## 2017-08-05 NOTE — Telephone Encounter (Signed)
I called and they said the patient has to initiate the appeal. When Malgorzata receives the letter that will be mailed to her she has to send an appeal letter back to [employer] with her name, DOB, and insurance card R # on it with this statement "Please reconsider me for the regranex medication". Once Yulonda has done this then our office should fax her office notes and any other clinicals to  [phone] also in the fax include that I am available for a peer to peer if need to be reached at [phone] on Mondays after 2PM EST. Thanks Dr. Marylene Land

## 2017-08-05 NOTE — Telephone Encounter (Signed)
Val, can you check up on this and call the patient

## 2017-08-05 NOTE — Telephone Encounter (Signed)
I was calling to see if the medication Dr. Marylene Land was supposed to order has been ordered? Please call me back at [phone]. Thank you.

## 2017-08-06 DIAGNOSIS — R300 Dysuria: Secondary | ICD-10-CM | POA: Diagnosis not present

## 2017-08-06 DIAGNOSIS — N3091 Cystitis, unspecified with hematuria: Secondary | ICD-10-CM | POA: Diagnosis not present

## 2017-08-08 DIAGNOSIS — L97422 Non-pressure chronic ulcer of left heel and midfoot with fat layer exposed: Secondary | ICD-10-CM | POA: Diagnosis not present

## 2017-08-08 DIAGNOSIS — L97512 Non-pressure chronic ulcer of other part of right foot with fat layer exposed: Secondary | ICD-10-CM | POA: Diagnosis not present

## 2017-08-08 DIAGNOSIS — L97412 Non-pressure chronic ulcer of right heel and midfoot with fat layer exposed: Secondary | ICD-10-CM | POA: Diagnosis not present

## 2017-08-08 DIAGNOSIS — I10 Essential (primary) hypertension: Secondary | ICD-10-CM | POA: Diagnosis not present

## 2017-08-08 DIAGNOSIS — M069 Rheumatoid arthritis, unspecified: Secondary | ICD-10-CM | POA: Diagnosis not present

## 2017-08-08 DIAGNOSIS — E11621 Type 2 diabetes mellitus with foot ulcer: Secondary | ICD-10-CM | POA: Diagnosis not present

## 2017-08-08 NOTE — Telephone Encounter (Signed)
Regranex form was completed in [place] office. Routed message to M. Juan Quam to check status of the order.

## 2017-08-08 NOTE — Telephone Encounter (Signed)
Dr Marylene Land I could not find an order sent from [place], please advise.

## 2017-08-08 NOTE — Telephone Encounter (Signed)
Left message informing pt of Dr. Wynema Birch explanation of how the Regranex appeal process worked and to call once the letter for Regranex360 arrived and I would explain again if necessary.

## 2017-08-08 NOTE — Telephone Encounter (Signed)
[name]  We sent the Rx via fax for Regranex to [facility] pharmacy. I messaged you on Friday with information that the patient has to complete to appeal it since the medication was denied. Once the patient has completed her part then we can fax the clinicals as supporting information to get [facility] to re-consider the decision for the medication

## 2017-08-09 NOTE — Telephone Encounter (Signed)
Ok thanks very much -Dr. Kathie Rhodes

## 2017-08-10 DIAGNOSIS — I10 Essential (primary) hypertension: Secondary | ICD-10-CM | POA: Diagnosis not present

## 2017-08-10 DIAGNOSIS — L97412 Non-pressure chronic ulcer of right heel and midfoot with fat layer exposed: Secondary | ICD-10-CM | POA: Diagnosis not present

## 2017-08-10 DIAGNOSIS — E11621 Type 2 diabetes mellitus with foot ulcer: Secondary | ICD-10-CM | POA: Diagnosis not present

## 2017-08-10 DIAGNOSIS — M069 Rheumatoid arthritis, unspecified: Secondary | ICD-10-CM | POA: Diagnosis not present

## 2017-08-10 DIAGNOSIS — L97512 Non-pressure chronic ulcer of other part of right foot with fat layer exposed: Secondary | ICD-10-CM | POA: Diagnosis not present

## 2017-08-10 DIAGNOSIS — L97422 Non-pressure chronic ulcer of left heel and midfoot with fat layer exposed: Secondary | ICD-10-CM | POA: Diagnosis not present

## 2017-08-10 DIAGNOSIS — Z6825 Body mass index (BMI) 25.0-25.9, adult: Secondary | ICD-10-CM | POA: Diagnosis not present

## 2017-08-10 DIAGNOSIS — S32000S Wedge compression fracture of unspecified lumbar vertebra, sequela: Secondary | ICD-10-CM | POA: Diagnosis not present

## 2017-08-12 DIAGNOSIS — E11621 Type 2 diabetes mellitus with foot ulcer: Secondary | ICD-10-CM | POA: Diagnosis not present

## 2017-08-12 DIAGNOSIS — I10 Essential (primary) hypertension: Secondary | ICD-10-CM | POA: Diagnosis not present

## 2017-08-12 DIAGNOSIS — L97512 Non-pressure chronic ulcer of other part of right foot with fat layer exposed: Secondary | ICD-10-CM | POA: Diagnosis not present

## 2017-08-12 DIAGNOSIS — L97422 Non-pressure chronic ulcer of left heel and midfoot with fat layer exposed: Secondary | ICD-10-CM | POA: Diagnosis not present

## 2017-08-12 DIAGNOSIS — L97412 Non-pressure chronic ulcer of right heel and midfoot with fat layer exposed: Secondary | ICD-10-CM | POA: Diagnosis not present

## 2017-08-12 DIAGNOSIS — M069 Rheumatoid arthritis, unspecified: Secondary | ICD-10-CM | POA: Diagnosis not present

## 2017-08-15 DIAGNOSIS — L97512 Non-pressure chronic ulcer of other part of right foot with fat layer exposed: Secondary | ICD-10-CM | POA: Diagnosis not present

## 2017-08-15 DIAGNOSIS — M069 Rheumatoid arthritis, unspecified: Secondary | ICD-10-CM | POA: Diagnosis not present

## 2017-08-15 DIAGNOSIS — E11621 Type 2 diabetes mellitus with foot ulcer: Secondary | ICD-10-CM | POA: Diagnosis not present

## 2017-08-15 DIAGNOSIS — L97422 Non-pressure chronic ulcer of left heel and midfoot with fat layer exposed: Secondary | ICD-10-CM | POA: Diagnosis not present

## 2017-08-15 DIAGNOSIS — L97412 Non-pressure chronic ulcer of right heel and midfoot with fat layer exposed: Secondary | ICD-10-CM | POA: Diagnosis not present

## 2017-08-15 DIAGNOSIS — I10 Essential (primary) hypertension: Secondary | ICD-10-CM | POA: Diagnosis not present

## 2017-08-17 DIAGNOSIS — I10 Essential (primary) hypertension: Secondary | ICD-10-CM | POA: Diagnosis not present

## 2017-08-17 DIAGNOSIS — L97422 Non-pressure chronic ulcer of left heel and midfoot with fat layer exposed: Secondary | ICD-10-CM | POA: Diagnosis not present

## 2017-08-17 DIAGNOSIS — L97512 Non-pressure chronic ulcer of other part of right foot with fat layer exposed: Secondary | ICD-10-CM | POA: Diagnosis not present

## 2017-08-17 DIAGNOSIS — E11621 Type 2 diabetes mellitus with foot ulcer: Secondary | ICD-10-CM | POA: Diagnosis not present

## 2017-08-17 DIAGNOSIS — M069 Rheumatoid arthritis, unspecified: Secondary | ICD-10-CM | POA: Diagnosis not present

## 2017-08-17 DIAGNOSIS — L97412 Non-pressure chronic ulcer of right heel and midfoot with fat layer exposed: Secondary | ICD-10-CM | POA: Diagnosis not present

## 2017-08-17 DIAGNOSIS — L97519 Non-pressure chronic ulcer of other part of right foot with unspecified severity: Secondary | ICD-10-CM | POA: Diagnosis not present

## 2017-08-18 ENCOUNTER — Ambulatory Visit (INDEPENDENT_AMBULATORY_CARE_PROVIDER_SITE_OTHER): Payer: Medicare Other | Admitting: Sports Medicine

## 2017-08-18 ENCOUNTER — Encounter: Payer: Self-pay | Admitting: Sports Medicine

## 2017-08-18 ENCOUNTER — Telehealth: Payer: Self-pay | Admitting: *Deleted

## 2017-08-18 DIAGNOSIS — L97511 Non-pressure chronic ulcer of other part of right foot limited to breakdown of skin: Secondary | ICD-10-CM

## 2017-08-18 DIAGNOSIS — I739 Peripheral vascular disease, unspecified: Secondary | ICD-10-CM | POA: Diagnosis not present

## 2017-08-18 DIAGNOSIS — E1142 Type 2 diabetes mellitus with diabetic polyneuropathy: Secondary | ICD-10-CM | POA: Diagnosis not present

## 2017-08-18 DIAGNOSIS — L97522 Non-pressure chronic ulcer of other part of left foot with fat layer exposed: Secondary | ICD-10-CM

## 2017-08-18 NOTE — Telephone Encounter (Signed)
-----   Message from Titorya Stover, DPM sent at 08/18/2017  5:06 PM EDT ----- Regarding: Home nursing orders Wound culture obtained on left foot and applied medi-honey to all sites bilateral and offloading pads covered with dry sterile dressing and instructed patient to continue with daily dressings at home consisting of the same with assistance from [facility] with wound care orders as follows of Medihoney to the wounds 2x to 3x per week as previous until she antibiotic impregnated wound powder that was ordered today from [facility]; culture was also obtained and we will send the results to the pharmacy to help with forming the antibiotic powder that will be applied to all her wounds once received 

## 2017-08-18 NOTE — Telephone Encounter (Signed)
DR. Marylene Land 08/18/2017 5:06PM ORDERS FAXED TO [facility].

## 2017-08-18 NOTE — Progress Notes (Signed)
Subjective: [name] is a 77 y.o. female patient seen in office for follow up evaluation of ulcerations bilateral.  Patient has home nursing weekly, currently using medihoney to wounds, denies nausea/fever/vomiting/chills/night sweats/shortness of breath/chest pain.  Patient has no other pedal complaints at this time.  Patient is assisted by family friend this visit.  Patient Active Problem List   Diagnosis Date Noted  . 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 PAF (paroxysmal atrial fibrillation) (HCC) 06/27/2017  . Coronary artery disease 12/08/2010  . Dyspnea 12/08/2010  . HYPERTENSION, BENIGN 04/01/2010  . RHEUMATOID ARTHRITIS 04/01/2010  . ABNORMAL CV (STRESS) TEST 04/01/2010  . LEG PAIN, RIGHT 03/12/2010   Current Outpatient Medications on File Prior to Visit  Medication Sig Dispense Refill  . amLODipine (NORVASC) 2.5 MG tablet Take 1 tablet (2.5 mg total) by mouth daily. 90 tablet 3  . aspirin 81 MG chewable tablet Chew 81 mg by mouth daily.    . B-D ULTRAFINE III SHORT PEN 31G X 8 MM MISC     . Cholecalciferol (VITAMIN D3) 2000 units TABS Take 1 tablet by mouth daily.    . cloNIDine (CATAPRES) 0.1 MG tablet Take 1 tablet by mouth Three times a day.    . diazepam (VALIUM) 5 MG tablet Take 5 mg by mouth 3 (three) times daily.      Marland Kitchen FREESTYLE LITE test strip     . furosemide (LASIX) 40 MG tablet Take 40 mg by mouth daily.      Marland Kitchen gabapentin (NEURONTIN) 100 MG capsule Take 100 mg by mouth 2 (two) times daily.    . insulin NPH-insulin regular (NOVOLIN 70/30) (70-30) 100 UNIT/ML injection Inject 39 units into the skin each morning and 6 units into the skin each evening.    . lansoprazole (PREVACID) 30 MG capsule Take 30 mg by mouth daily.      Marland Kitchen leflunomide (ARAVA) 10 MG tablet Take 10 mg by mouth daily.    Marland Kitchen levothyroxine (SYNTHROID, LEVOTHROID) 75 MCG tablet Take 75 mcg by mouth daily.      Marland Kitchen lidocaine (LIDODERM) 5 % UNW AND APP 1 PA TO SKIN UP TO 16 H PER DAY  6  .  metFORMIN (GLUCOPHAGE) 1000 MG tablet Take 500 mg by mouth 3 (three) times daily. Take 1/2 tab twice a day    . morphine (MS CONTIN) 15 MG 12 hr tablet Take by mouth.    . Multiple Vitamin (MULTIVITAMIN) tablet Take 1 tablet by mouth daily.    . mupirocin ointment (BACTROBAN) 2 % To right toe ulcer 30 g 0  . oxyCODONE (OXY IR/ROXICODONE) 5 MG immediate release tablet Take 5 mg by mouth 3 (three) times daily.     Marland Kitchen oxyCODONE (OXYCONTIN) 40 MG 12 hr tablet Take 40 mg by mouth every 8 (eight) hours.      . potassium chloride (KLOR-CON 10) 10 MEQ CR tablet Take 10 mEq by mouth 2 (two) times daily. When taking furosemide     . predniSONE (DELTASONE) 5 MG tablet Take 5 mg by mouth 2 (two) times daily.     . rosuvastatin (CRESTOR) 5 MG tablet Take 0.5 tablets (2.5 mg total) by mouth daily. 30 tablet 3  . spironolactone (ALDACTONE) 25 MG tablet Take 25 mg by mouth 2 (two) times daily.     Current Facility-Administered Medications on File Prior to Visit  Medication Dose Route Frequency Provider Last Rate Last Dose  . apixaban (ELIQUIS) tablet 5 mg  5 mg Oral BID Pearlean Brownie,  Jason Fila, MD       Allergies  Allergen Reactions  . Gold Anaphylaxis    Swelling of lips/tongue, throat Swelling of lips/tongue, throat  . Nsaids Other (See Comments)    Intolerance Intolerance  . Sulfa Antibiotics Nausea Only  . Sulfonamide Derivatives   . Latex Other (See Comments)    blisters  . Sulfamethoxazole Nausea Only    No results found for this or any previous visit (from the past 2160 hour(s)).  Objective: There were no vitals filed for this visit.  General: Patient is awake, alert, oriented x 3 and in no acute distress.  Dermatology: Skin is warm and dry bilateral with a partial thickness ulceration present right plantar hallux distal ulceration that measures 0.1cm x 0.1cm x 0.1cm (smaller than previous) there is a keratotic border with a fibrogranular base. The ulceration does not probe to bone. There is no  malodor, no active drainage, decreased erythema, decreased edema. No other acute signs of infection.   To lateral right fifth metatarsal is healed over ulcer continues to be pre-ulcerative in nature.  To the right dorsal midfoot is an healed over continues to be pre-ulcerative in nature.  To the left foot at the third toe there is healed over continues to be pre-ulcerative in nature.  To the plantar left forefoot there is a ulceration that measures 2 cm x 1cm x 0.5 cm (larger than previous) probes to the joint capsule with a granular base and mildly keratotic margins with maceration and clear drainage, no other acute signs of infection.  Vascular: Dorsalis Pedis pulse = 1/4 Bilateral,  Posterior Tibial pulse = 0/4 Bilateral,  Capillary Fill Time < 5 seconds. Chronic venous skin changes.  Neurologic: Protective sensation absent to the level of the ankles bilateral using  the 5.07/10g [employer].  Musculosketal: No Pain with palpation to ulcerated areas. No pain with compression to calves bilateral. Planus and digital deformity with rigid hallux extensus on right.   No results for input(s): GRAMSTAIN, LABORGA in the last 8760 hours.  Assessment and Plan:  Problem List Items Addressed This Visit    None    Visit Diagnoses    Ulcer of left foot, with fat layer exposed (HCC)    -  Primary   Relevant Orders   WOUND CULTURE   Toe ulcer, right, limited to breakdown of skin (HCC)       Diabetic polyneuropathy associated with type 2 diabetes mellitus (HCC)       Relevant Orders   WOUND CULTURE   PVD (peripheral vascular disease) (HCC)       Relevant Orders   WOUND CULTURE     -Examined patient and re-discussed the progression of the wound and treatment alternatives.  Excisionally dedbrided ulcerations bilateral to healthy bleeding borders removing nonviable tissue using a sterile chisel blade. Wound measure post debridement as above.  Wounds were debrided to the level of  the dermis with viable wound base exposed to promote healing. Hemostasis was achieved with manuel pressure. Patient tolerated procedure well without any discomfort or anesthesia necessary for this wound debridement.  -Wound culture obtained on left foot and applied medi-honey to all sites bilateral and offloading pads covered with dry sterile dressing and instructed patient to continue with daily dressings at home consisting of the same with assistance from [facility] with wound care orders as follows of Medihoney to the wounds 2x to 3x per week as previous until the antibiotic impregnated wound powder that was ordered today  from [facility]; culture was also obtained and we will send the results to the pharmacy to help with forming the antibiotic powder that will be applied to all her wounds once received -Meanwhile patient will also send a letter to her insurance company requesting to be reconsidered again for Regranex -Continue home PT to tolerance for gait and stability training  -Continue with postop shoes; office to dispense a size small when available -Advised patient to go to the ER or return to office if the wound worsens or if constitutional symptoms are present. -Patient to return to office as scheduled in 2-3 weeks for follow up care/wound evaluation or sooner if problems arise.  Asencion Islam, DPM

## 2017-08-18 NOTE — Telephone Encounter (Deleted)
-----   Message from [name], [place] sent at 08/18/2017  5:06 PM EDT ----- Regarding: Home nursing orders Wound culture obtained on left foot and applied medi-honey to all sites bilateral and offloading pads covered with dry sterile dressing and instructed patient to continue with daily dressings at home consisting of the same with assistance from [facility] with wound care orders as follows of Medihoney to the wounds 2x to 3x per week as previous until she antibiotic impregnated wound powder that was ordered today from [employer]; culture was also obtained and we will send the results to the pharmacy to help with forming the antibiotic powder that will be applied to all her wounds once received

## 2017-08-19 DIAGNOSIS — M069 Rheumatoid arthritis, unspecified: Secondary | ICD-10-CM | POA: Diagnosis not present

## 2017-08-19 DIAGNOSIS — L97422 Non-pressure chronic ulcer of left heel and midfoot with fat layer exposed: Secondary | ICD-10-CM | POA: Diagnosis not present

## 2017-08-19 DIAGNOSIS — E11621 Type 2 diabetes mellitus with foot ulcer: Secondary | ICD-10-CM | POA: Diagnosis not present

## 2017-08-19 DIAGNOSIS — L97412 Non-pressure chronic ulcer of right heel and midfoot with fat layer exposed: Secondary | ICD-10-CM | POA: Diagnosis not present

## 2017-08-19 DIAGNOSIS — L97512 Non-pressure chronic ulcer of other part of right foot with fat layer exposed: Secondary | ICD-10-CM | POA: Diagnosis not present

## 2017-08-19 DIAGNOSIS — I10 Essential (primary) hypertension: Secondary | ICD-10-CM | POA: Diagnosis not present

## 2017-08-22 DIAGNOSIS — E11621 Type 2 diabetes mellitus with foot ulcer: Secondary | ICD-10-CM | POA: Diagnosis not present

## 2017-08-22 DIAGNOSIS — L97422 Non-pressure chronic ulcer of left heel and midfoot with fat layer exposed: Secondary | ICD-10-CM | POA: Diagnosis not present

## 2017-08-22 DIAGNOSIS — M069 Rheumatoid arthritis, unspecified: Secondary | ICD-10-CM | POA: Diagnosis not present

## 2017-08-22 DIAGNOSIS — L97412 Non-pressure chronic ulcer of right heel and midfoot with fat layer exposed: Secondary | ICD-10-CM | POA: Diagnosis not present

## 2017-08-22 DIAGNOSIS — L97512 Non-pressure chronic ulcer of other part of right foot with fat layer exposed: Secondary | ICD-10-CM | POA: Diagnosis not present

## 2017-08-22 DIAGNOSIS — I10 Essential (primary) hypertension: Secondary | ICD-10-CM | POA: Diagnosis not present

## 2017-08-22 LAB — WOUND CULTURE

## 2017-08-24 DIAGNOSIS — L97422 Non-pressure chronic ulcer of left heel and midfoot with fat layer exposed: Secondary | ICD-10-CM | POA: Diagnosis not present

## 2017-08-24 DIAGNOSIS — M069 Rheumatoid arthritis, unspecified: Secondary | ICD-10-CM | POA: Diagnosis not present

## 2017-08-24 DIAGNOSIS — L97412 Non-pressure chronic ulcer of right heel and midfoot with fat layer exposed: Secondary | ICD-10-CM | POA: Diagnosis not present

## 2017-08-24 DIAGNOSIS — I10 Essential (primary) hypertension: Secondary | ICD-10-CM | POA: Diagnosis not present

## 2017-08-24 DIAGNOSIS — L97512 Non-pressure chronic ulcer of other part of right foot with fat layer exposed: Secondary | ICD-10-CM | POA: Diagnosis not present

## 2017-08-24 DIAGNOSIS — E11621 Type 2 diabetes mellitus with foot ulcer: Secondary | ICD-10-CM | POA: Diagnosis not present

## 2017-08-26 DIAGNOSIS — E11621 Type 2 diabetes mellitus with foot ulcer: Secondary | ICD-10-CM | POA: Diagnosis not present

## 2017-08-26 DIAGNOSIS — L97412 Non-pressure chronic ulcer of right heel and midfoot with fat layer exposed: Secondary | ICD-10-CM | POA: Diagnosis not present

## 2017-08-26 DIAGNOSIS — G250 Essential tremor: Secondary | ICD-10-CM | POA: Diagnosis not present

## 2017-08-26 DIAGNOSIS — L97512 Non-pressure chronic ulcer of other part of right foot with fat layer exposed: Secondary | ICD-10-CM | POA: Diagnosis not present

## 2017-08-26 DIAGNOSIS — H539 Unspecified visual disturbance: Secondary | ICD-10-CM | POA: Diagnosis not present

## 2017-08-26 DIAGNOSIS — I10 Essential (primary) hypertension: Secondary | ICD-10-CM | POA: Diagnosis not present

## 2017-08-26 DIAGNOSIS — Z79899 Other long term (current) drug therapy: Secondary | ICD-10-CM | POA: Diagnosis not present

## 2017-08-26 DIAGNOSIS — L97422 Non-pressure chronic ulcer of left heel and midfoot with fat layer exposed: Secondary | ICD-10-CM | POA: Diagnosis not present

## 2017-08-26 DIAGNOSIS — E039 Hypothyroidism, unspecified: Secondary | ICD-10-CM | POA: Diagnosis not present

## 2017-08-26 DIAGNOSIS — E119 Type 2 diabetes mellitus without complications: Secondary | ICD-10-CM | POA: Diagnosis not present

## 2017-08-26 DIAGNOSIS — R251 Tremor, unspecified: Secondary | ICD-10-CM | POA: Diagnosis not present

## 2017-08-26 DIAGNOSIS — M069 Rheumatoid arthritis, unspecified: Secondary | ICD-10-CM | POA: Diagnosis not present

## 2017-08-29 DIAGNOSIS — E11621 Type 2 diabetes mellitus with foot ulcer: Secondary | ICD-10-CM | POA: Diagnosis not present

## 2017-08-29 DIAGNOSIS — M069 Rheumatoid arthritis, unspecified: Secondary | ICD-10-CM | POA: Diagnosis not present

## 2017-08-29 DIAGNOSIS — L97412 Non-pressure chronic ulcer of right heel and midfoot with fat layer exposed: Secondary | ICD-10-CM | POA: Diagnosis not present

## 2017-08-29 DIAGNOSIS — L97512 Non-pressure chronic ulcer of other part of right foot with fat layer exposed: Secondary | ICD-10-CM | POA: Diagnosis not present

## 2017-08-29 DIAGNOSIS — I10 Essential (primary) hypertension: Secondary | ICD-10-CM | POA: Diagnosis not present

## 2017-08-29 DIAGNOSIS — L97422 Non-pressure chronic ulcer of left heel and midfoot with fat layer exposed: Secondary | ICD-10-CM | POA: Diagnosis not present

## 2017-08-31 DIAGNOSIS — L97412 Non-pressure chronic ulcer of right heel and midfoot with fat layer exposed: Secondary | ICD-10-CM | POA: Diagnosis not present

## 2017-08-31 DIAGNOSIS — L97422 Non-pressure chronic ulcer of left heel and midfoot with fat layer exposed: Secondary | ICD-10-CM | POA: Diagnosis not present

## 2017-08-31 DIAGNOSIS — I10 Essential (primary) hypertension: Secondary | ICD-10-CM | POA: Diagnosis not present

## 2017-08-31 DIAGNOSIS — L97512 Non-pressure chronic ulcer of other part of right foot with fat layer exposed: Secondary | ICD-10-CM | POA: Diagnosis not present

## 2017-08-31 DIAGNOSIS — E11621 Type 2 diabetes mellitus with foot ulcer: Secondary | ICD-10-CM | POA: Diagnosis not present

## 2017-08-31 DIAGNOSIS — M069 Rheumatoid arthritis, unspecified: Secondary | ICD-10-CM | POA: Diagnosis not present

## 2017-09-02 ENCOUNTER — Telehealth: Payer: Self-pay | Admitting: *Deleted

## 2017-09-02 DIAGNOSIS — L97512 Non-pressure chronic ulcer of other part of right foot with fat layer exposed: Secondary | ICD-10-CM | POA: Diagnosis not present

## 2017-09-02 DIAGNOSIS — L97422 Non-pressure chronic ulcer of left heel and midfoot with fat layer exposed: Secondary | ICD-10-CM | POA: Diagnosis not present

## 2017-09-02 DIAGNOSIS — Z6826 Body mass index (BMI) 26.0-26.9, adult: Secondary | ICD-10-CM | POA: Diagnosis not present

## 2017-09-02 DIAGNOSIS — L03119 Cellulitis of unspecified part of limb: Secondary | ICD-10-CM | POA: Diagnosis not present

## 2017-09-02 DIAGNOSIS — L97412 Non-pressure chronic ulcer of right heel and midfoot with fat layer exposed: Secondary | ICD-10-CM | POA: Diagnosis not present

## 2017-09-02 DIAGNOSIS — M069 Rheumatoid arthritis, unspecified: Secondary | ICD-10-CM | POA: Diagnosis not present

## 2017-09-02 DIAGNOSIS — E11621 Type 2 diabetes mellitus with foot ulcer: Secondary | ICD-10-CM | POA: Diagnosis not present

## 2017-09-02 DIAGNOSIS — I10 Essential (primary) hypertension: Secondary | ICD-10-CM | POA: Diagnosis not present

## 2017-09-02 DIAGNOSIS — N3000 Acute cystitis without hematuria: Secondary | ICD-10-CM | POA: Diagnosis not present

## 2017-09-02 NOTE — Telephone Encounter (Signed)
[name] - [facility] asked for verbal order to reinstate skilled nursing for 3xweek for 8weeks.

## 2017-09-02 NOTE — Telephone Encounter (Signed)
I informed Melissa - [facility] Dr. Theodoro Doing to reinstate pt as recommended.

## 2017-09-03 DIAGNOSIS — E11621 Type 2 diabetes mellitus with foot ulcer: Secondary | ICD-10-CM | POA: Diagnosis not present

## 2017-09-03 DIAGNOSIS — L97422 Non-pressure chronic ulcer of left heel and midfoot with fat layer exposed: Secondary | ICD-10-CM | POA: Diagnosis not present

## 2017-09-03 DIAGNOSIS — I69312 Visuospatial deficit and spatial neglect following cerebral infarction: Secondary | ICD-10-CM | POA: Diagnosis not present

## 2017-09-03 DIAGNOSIS — M069 Rheumatoid arthritis, unspecified: Secondary | ICD-10-CM | POA: Diagnosis not present

## 2017-09-03 DIAGNOSIS — I69311 Memory deficit following cerebral infarction: Secondary | ICD-10-CM | POA: Diagnosis not present

## 2017-09-03 DIAGNOSIS — I10 Essential (primary) hypertension: Secondary | ICD-10-CM | POA: Diagnosis not present

## 2017-09-05 ENCOUNTER — Telehealth: Payer: Self-pay | Admitting: Sports Medicine

## 2017-09-05 DIAGNOSIS — I10 Essential (primary) hypertension: Secondary | ICD-10-CM | POA: Diagnosis not present

## 2017-09-05 DIAGNOSIS — I69312 Visuospatial deficit and spatial neglect following cerebral infarction: Secondary | ICD-10-CM | POA: Diagnosis not present

## 2017-09-05 DIAGNOSIS — M069 Rheumatoid arthritis, unspecified: Secondary | ICD-10-CM | POA: Diagnosis not present

## 2017-09-05 DIAGNOSIS — I69311 Memory deficit following cerebral infarction: Secondary | ICD-10-CM | POA: Diagnosis not present

## 2017-09-05 DIAGNOSIS — L97422 Non-pressure chronic ulcer of left heel and midfoot with fat layer exposed: Secondary | ICD-10-CM | POA: Diagnosis not present

## 2017-09-05 DIAGNOSIS — E11621 Type 2 diabetes mellitus with foot ulcer: Secondary | ICD-10-CM | POA: Diagnosis not present

## 2017-09-05 NOTE — Telephone Encounter (Signed)
This is [name], Charity fundraiser with [facility]. I'm currently here with [name]. I have a question concerning her wound care orders with the foot powder. If someone could contact me back at [phone]. Thanks.

## 2017-09-06 NOTE — Telephone Encounter (Signed)
Cleanse wound with saline and then apply the powder to the wound bed and cover the damp saline guaze and dry dressing (wet to dry) -Dr. Kathie Rhodes

## 2017-09-07 ENCOUNTER — Encounter: Payer: Self-pay | Admitting: Sports Medicine

## 2017-09-07 ENCOUNTER — Ambulatory Visit (INDEPENDENT_AMBULATORY_CARE_PROVIDER_SITE_OTHER): Payer: Medicare Other | Admitting: Sports Medicine

## 2017-09-07 DIAGNOSIS — L97522 Non-pressure chronic ulcer of other part of left foot with fat layer exposed: Secondary | ICD-10-CM | POA: Diagnosis not present

## 2017-09-07 DIAGNOSIS — E1142 Type 2 diabetes mellitus with diabetic polyneuropathy: Secondary | ICD-10-CM

## 2017-09-07 DIAGNOSIS — I739 Peripheral vascular disease, unspecified: Secondary | ICD-10-CM

## 2017-09-07 NOTE — Telephone Encounter (Signed)
Faxed Dr. Wynema Birch 09/06/2017 5:28pm orders to [facility].

## 2017-09-07 NOTE — Progress Notes (Signed)
Subjective: [name] is a 77 y.o. female patient seen in office for follow up evaluation of ulcerations bilateral.  Patient has home nursing weekly, currently using customized compound powder to wounds, denies nausea/fever/vomiting/chills/night sweats/shortness of breath/chest pain.  Patient has no other pedal complaints at this time.  Patient is assisted by son this visit.  Patient Active Problem List   Diagnosis Date Noted  . 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 PAF (paroxysmal atrial fibrillation) (HCC) 06/27/2017  . Coronary artery disease 12/08/2010  . Dyspnea 12/08/2010  . HYPERTENSION, BENIGN 04/01/2010  . RHEUMATOID ARTHRITIS 04/01/2010  . ABNORMAL CV (STRESS) TEST 04/01/2010  . LEG PAIN, RIGHT 03/12/2010   Current Outpatient Medications on File Prior to Visit  Medication Sig Dispense Refill  . amLODipine (NORVASC) 2.5 MG tablet Take 1 tablet (2.5 mg total) by mouth daily. 90 tablet 3  . aspirin 81 MG chewable tablet Chew 81 mg by mouth daily.    . B-D ULTRAFINE III SHORT PEN 31G X 8 MM MISC     . Cholecalciferol (VITAMIN D3) 2000 units TABS Take 1 tablet by mouth daily.    . cloNIDine (CATAPRES) 0.1 MG tablet Take 1 tablet by mouth Three times a day.    . diazepam (VALIUM) 5 MG tablet Take 5 mg by mouth 3 (three) times daily.      Marland Kitchen FREESTYLE LITE test strip     . furosemide (LASIX) 40 MG tablet Take 40 mg by mouth daily.      Marland Kitchen gabapentin (NEURONTIN) 100 MG capsule Take 100 mg by mouth 2 (two) times daily.    . insulin NPH-insulin regular (NOVOLIN 70/30) (70-30) 100 UNIT/ML injection Inject 39 units into the skin each morning and 6 units into the skin each evening.    . lansoprazole (PREVACID) 30 MG capsule Take 30 mg by mouth daily.      Marland Kitchen leflunomide (ARAVA) 10 MG tablet Take 10 mg by mouth daily.    Marland Kitchen levothyroxine (SYNTHROID, LEVOTHROID) 75 MCG tablet Take 75 mcg by mouth daily.      Marland Kitchen lidocaine (LIDODERM) 5 % UNW AND APP 1 PA TO SKIN UP TO 16 H PER DAY  6  .  metFORMIN (GLUCOPHAGE) 1000 MG tablet Take 500 mg by mouth 3 (three) times daily. Take 1/2 tab twice a day    . morphine (MS CONTIN) 15 MG 12 hr tablet Take by mouth.    . Multiple Vitamin (MULTIVITAMIN) tablet Take 1 tablet by mouth daily.    . mupirocin ointment (BACTROBAN) 2 % To right toe ulcer 30 g 0  . oxyCODONE (OXY IR/ROXICODONE) 5 MG immediate release tablet Take 5 mg by mouth 3 (three) times daily.     Marland Kitchen oxyCODONE (OXYCONTIN) 40 MG 12 hr tablet Take 40 mg by mouth every 8 (eight) hours.      . potassium chloride (KLOR-CON 10) 10 MEQ CR tablet Take 10 mEq by mouth 2 (two) times daily. When taking furosemide     . predniSONE (DELTASONE) 5 MG tablet Take 5 mg by mouth 2 (two) times daily.     . rosuvastatin (CRESTOR) 5 MG tablet Take 0.5 tablets (2.5 mg total) by mouth daily. 30 tablet 3  . spironolactone (ALDACTONE) 25 MG tablet Take 25 mg by mouth 2 (two) times daily.    . Vancomycin HCl (VANCOMYCIN 2.5 MG/ML + HEPARIN 2500 UNITS/ML) 2.5 mLs by Intracatheter route.     Current Facility-Administered Medications on File Prior to Visit  Medication Dose Route  Frequency Provider Last Rate Last Dose  . apixaban (ELIQUIS) tablet 5 mg  5 mg Oral BID Micki Riley, MD       Allergies  Allergen Reactions  . Gold Anaphylaxis    Swelling of lips/tongue, throat Swelling of lips/tongue, throat  . Nsaids Other (See Comments)    Intolerance Intolerance  . Sulfa Antibiotics Nausea Only  . Sulfonamide Derivatives   . Latex Other (See Comments)    blisters  . Sulfamethoxazole Nausea Only    Recent Results (from the past 2160 hour(s))  WOUND CULTURE     Status: Abnormal   Collection Time: 08/18/17  4:30 PM  Result Value Ref Range   Gram Stain Result Final report    Organism ID, Bacteria Comment     Comment: Few white blood cells.   Organism ID, Bacteria Comment     Comment: Few gram negative diplococci.   Organism ID, Bacteria Comment     Comment: Few gram negative rods.   Aerobic  Bacterial Culture Final report (A)    Organism ID, Bacteria Enterococcus faecalis (A)     Comment: Heavy growth   Organism ID, Bacteria Comment (A)     Comment: Corynebacterium species Heavy growth Susceptibility not normally performed on this organism.    Antimicrobial Susceptibility Comment     Comment:       ** S = Susceptible; I = Intermediate; R = Resistant **                    P = Positive; N = Negative             MICS are expressed in micrograms per mL    Antibiotic                 RSLT#1    RSLT#2    RSLT#3    RSLT#4 Penicillin                     S Vancomycin                     S     Objective: There were no vitals filed for this visit.  General: Patient is awake, alert, oriented x 3 and in no acute distress.  Dermatology: Skin is warm and dry bilateral with prematurely healed ulcerations that have skin callus or scabbed over on the right foot. No other acute signs of infection.   To the left foot at the third toe there is healed over continues to be pre-ulcerative in nature.  To the plantar left forefoot there is a ulceration that measures 1 cm x 0.5 cm x 0.5 cm (smaller than previous) probes to the joint capsule with a granular base and mildly keratotic margins with maceration and improved drainage, no other acute signs of infection.  Vascular: Dorsalis Pedis pulse = 1/4 Bilateral,  Posterior Tibial pulse = 0/4 Bilateral,  Capillary Fill Time < 5 seconds. Chronic venous skin changes.  Neurologic: Protective sensation absent to the level of the ankles bilateral using  the 5.07/10g [employer].  Musculosketal: No Pain with palpation to ulcerated area on the left. No pain with compression to calves bilateral. Planus and digital deformity with rigid hallux extensus on right.   No results for input(s): GRAMSTAIN, LABORGA in the last 8760 hours.  Assessment and Plan:  Problem List Items Addressed This Visit    None    Visit Diagnoses    Ulcer  of  left foot, with fat layer exposed (HCC)    -  Primary   Diabetic polyneuropathy associated with type 2 diabetes mellitus (HCC)       PVD (peripheral vascular disease) (HCC)       Relevant Medications   Vancomycin HCl (VANCOMYCIN 2.5 MG/ML + HEPARIN 2500 UNITS/ML)     -Examined patient and re-discussed the progression of the wound and treatment alternatives.  Excisionally dedbrided ulcerations bilateral to healthy bleeding borders removing nonviable tissue using a sterile chisel blade. Wound measure post debridement as above.  Wound at left foot was debrided to the level of the dermis with viable wound base exposed to promote healing. Hemostasis was achieved with manuel pressure. Patient tolerated procedure well without any discomfort or anesthesia necessary for this wound debridement.  -To left plantar forefoot applied antibiotic impregnated wound powder that was ordered from [facility] covered with dry dressing and offloading padding -Patient is still awaiting consideration for Regranex -Continue home PT to tolerance for gait and stability training  -Continue with postop shoes; dispensed a new set in size small to patient at today's visit -Advised patient to go to the ER or return to office if the wound worsens or if constitutional symptoms are present. -Patient to return to office as scheduled in 2-3 weeks for follow up care/wound evaluation or sooner if problems arise.  Asencion Islam, DPM

## 2017-09-08 DIAGNOSIS — M4312 Spondylolisthesis, cervical region: Secondary | ICD-10-CM | POA: Diagnosis not present

## 2017-09-08 DIAGNOSIS — M0579 Rheumatoid arthritis with rheumatoid factor of multiple sites without organ or systems involvement: Secondary | ICD-10-CM | POA: Diagnosis not present

## 2017-09-09 DIAGNOSIS — I10 Essential (primary) hypertension: Secondary | ICD-10-CM | POA: Diagnosis not present

## 2017-09-09 DIAGNOSIS — L97422 Non-pressure chronic ulcer of left heel and midfoot with fat layer exposed: Secondary | ICD-10-CM | POA: Diagnosis not present

## 2017-09-09 DIAGNOSIS — I69311 Memory deficit following cerebral infarction: Secondary | ICD-10-CM | POA: Diagnosis not present

## 2017-09-09 DIAGNOSIS — E11621 Type 2 diabetes mellitus with foot ulcer: Secondary | ICD-10-CM | POA: Diagnosis not present

## 2017-09-09 DIAGNOSIS — M069 Rheumatoid arthritis, unspecified: Secondary | ICD-10-CM | POA: Diagnosis not present

## 2017-09-09 DIAGNOSIS — I69312 Visuospatial deficit and spatial neglect following cerebral infarction: Secondary | ICD-10-CM | POA: Diagnosis not present

## 2017-09-12 DIAGNOSIS — I69311 Memory deficit following cerebral infarction: Secondary | ICD-10-CM | POA: Diagnosis not present

## 2017-09-12 DIAGNOSIS — E11621 Type 2 diabetes mellitus with foot ulcer: Secondary | ICD-10-CM | POA: Diagnosis not present

## 2017-09-12 DIAGNOSIS — I69312 Visuospatial deficit and spatial neglect following cerebral infarction: Secondary | ICD-10-CM | POA: Diagnosis not present

## 2017-09-12 DIAGNOSIS — L97422 Non-pressure chronic ulcer of left heel and midfoot with fat layer exposed: Secondary | ICD-10-CM | POA: Diagnosis not present

## 2017-09-12 DIAGNOSIS — I10 Essential (primary) hypertension: Secondary | ICD-10-CM | POA: Diagnosis not present

## 2017-09-12 DIAGNOSIS — M069 Rheumatoid arthritis, unspecified: Secondary | ICD-10-CM | POA: Diagnosis not present

## 2017-09-13 DIAGNOSIS — R911 Solitary pulmonary nodule: Secondary | ICD-10-CM | POA: Diagnosis not present

## 2017-09-13 DIAGNOSIS — J90 Pleural effusion, not elsewhere classified: Secondary | ICD-10-CM | POA: Diagnosis not present

## 2017-09-13 DIAGNOSIS — M069 Rheumatoid arthritis, unspecified: Secondary | ICD-10-CM | POA: Diagnosis not present

## 2017-09-13 DIAGNOSIS — R918 Other nonspecific abnormal finding of lung field: Secondary | ICD-10-CM | POA: Diagnosis not present

## 2017-09-13 DIAGNOSIS — M8008XD Age-related osteoporosis with current pathological fracture, vertebra(e), subsequent encounter for fracture with routine healing: Secondary | ICD-10-CM | POA: Diagnosis not present

## 2017-09-14 DIAGNOSIS — L97422 Non-pressure chronic ulcer of left heel and midfoot with fat layer exposed: Secondary | ICD-10-CM | POA: Diagnosis not present

## 2017-09-14 DIAGNOSIS — I69312 Visuospatial deficit and spatial neglect following cerebral infarction: Secondary | ICD-10-CM | POA: Diagnosis not present

## 2017-09-14 DIAGNOSIS — I10 Essential (primary) hypertension: Secondary | ICD-10-CM | POA: Diagnosis not present

## 2017-09-14 DIAGNOSIS — M069 Rheumatoid arthritis, unspecified: Secondary | ICD-10-CM | POA: Diagnosis not present

## 2017-09-14 DIAGNOSIS — E11621 Type 2 diabetes mellitus with foot ulcer: Secondary | ICD-10-CM | POA: Diagnosis not present

## 2017-09-14 DIAGNOSIS — I69311 Memory deficit following cerebral infarction: Secondary | ICD-10-CM | POA: Diagnosis not present

## 2017-09-16 DIAGNOSIS — M069 Rheumatoid arthritis, unspecified: Secondary | ICD-10-CM | POA: Diagnosis not present

## 2017-09-16 DIAGNOSIS — I69311 Memory deficit following cerebral infarction: Secondary | ICD-10-CM | POA: Diagnosis not present

## 2017-09-16 DIAGNOSIS — I10 Essential (primary) hypertension: Secondary | ICD-10-CM | POA: Diagnosis not present

## 2017-09-16 DIAGNOSIS — I69312 Visuospatial deficit and spatial neglect following cerebral infarction: Secondary | ICD-10-CM | POA: Diagnosis not present

## 2017-09-16 DIAGNOSIS — E11621 Type 2 diabetes mellitus with foot ulcer: Secondary | ICD-10-CM | POA: Diagnosis not present

## 2017-09-16 DIAGNOSIS — L97422 Non-pressure chronic ulcer of left heel and midfoot with fat layer exposed: Secondary | ICD-10-CM | POA: Diagnosis not present

## 2017-09-19 DIAGNOSIS — M069 Rheumatoid arthritis, unspecified: Secondary | ICD-10-CM | POA: Diagnosis not present

## 2017-09-19 DIAGNOSIS — I69312 Visuospatial deficit and spatial neglect following cerebral infarction: Secondary | ICD-10-CM | POA: Diagnosis not present

## 2017-09-19 DIAGNOSIS — E11621 Type 2 diabetes mellitus with foot ulcer: Secondary | ICD-10-CM | POA: Diagnosis not present

## 2017-09-19 DIAGNOSIS — I69311 Memory deficit following cerebral infarction: Secondary | ICD-10-CM | POA: Diagnosis not present

## 2017-09-19 DIAGNOSIS — I10 Essential (primary) hypertension: Secondary | ICD-10-CM | POA: Diagnosis not present

## 2017-09-19 DIAGNOSIS — L97422 Non-pressure chronic ulcer of left heel and midfoot with fat layer exposed: Secondary | ICD-10-CM | POA: Diagnosis not present

## 2017-09-20 DIAGNOSIS — H25812 Combined forms of age-related cataract, left eye: Secondary | ICD-10-CM | POA: Diagnosis not present

## 2017-09-21 DIAGNOSIS — I69312 Visuospatial deficit and spatial neglect following cerebral infarction: Secondary | ICD-10-CM | POA: Diagnosis not present

## 2017-09-21 DIAGNOSIS — R911 Solitary pulmonary nodule: Secondary | ICD-10-CM | POA: Insufficient documentation

## 2017-09-21 DIAGNOSIS — E11621 Type 2 diabetes mellitus with foot ulcer: Secondary | ICD-10-CM | POA: Diagnosis not present

## 2017-09-21 DIAGNOSIS — L97422 Non-pressure chronic ulcer of left heel and midfoot with fat layer exposed: Secondary | ICD-10-CM | POA: Diagnosis not present

## 2017-09-21 DIAGNOSIS — I69311 Memory deficit following cerebral infarction: Secondary | ICD-10-CM | POA: Diagnosis not present

## 2017-09-21 DIAGNOSIS — I10 Essential (primary) hypertension: Secondary | ICD-10-CM | POA: Diagnosis not present

## 2017-09-21 DIAGNOSIS — M069 Rheumatoid arthritis, unspecified: Secondary | ICD-10-CM | POA: Diagnosis not present

## 2017-09-21 HISTORY — DX: Solitary pulmonary nodule: R91.1

## 2017-09-23 DIAGNOSIS — M069 Rheumatoid arthritis, unspecified: Secondary | ICD-10-CM | POA: Diagnosis not present

## 2017-09-23 DIAGNOSIS — I69311 Memory deficit following cerebral infarction: Secondary | ICD-10-CM | POA: Diagnosis not present

## 2017-09-23 DIAGNOSIS — E11621 Type 2 diabetes mellitus with foot ulcer: Secondary | ICD-10-CM | POA: Diagnosis not present

## 2017-09-23 DIAGNOSIS — L97422 Non-pressure chronic ulcer of left heel and midfoot with fat layer exposed: Secondary | ICD-10-CM | POA: Diagnosis not present

## 2017-09-23 DIAGNOSIS — I69312 Visuospatial deficit and spatial neglect following cerebral infarction: Secondary | ICD-10-CM | POA: Diagnosis not present

## 2017-09-23 DIAGNOSIS — I10 Essential (primary) hypertension: Secondary | ICD-10-CM | POA: Diagnosis not present

## 2017-09-27 DIAGNOSIS — M069 Rheumatoid arthritis, unspecified: Secondary | ICD-10-CM | POA: Diagnosis not present

## 2017-09-27 DIAGNOSIS — E11621 Type 2 diabetes mellitus with foot ulcer: Secondary | ICD-10-CM | POA: Diagnosis not present

## 2017-09-27 DIAGNOSIS — I69311 Memory deficit following cerebral infarction: Secondary | ICD-10-CM | POA: Diagnosis not present

## 2017-09-27 DIAGNOSIS — I69312 Visuospatial deficit and spatial neglect following cerebral infarction: Secondary | ICD-10-CM | POA: Diagnosis not present

## 2017-09-27 DIAGNOSIS — I10 Essential (primary) hypertension: Secondary | ICD-10-CM | POA: Diagnosis not present

## 2017-09-27 DIAGNOSIS — L97422 Non-pressure chronic ulcer of left heel and midfoot with fat layer exposed: Secondary | ICD-10-CM | POA: Diagnosis not present

## 2017-09-29 ENCOUNTER — Encounter: Payer: Self-pay | Admitting: Sports Medicine

## 2017-09-29 ENCOUNTER — Ambulatory Visit (INDEPENDENT_AMBULATORY_CARE_PROVIDER_SITE_OTHER): Payer: Medicare Other | Admitting: Sports Medicine

## 2017-09-29 VITALS — BP 122/60 | HR 68 | Temp 97.6°F | Resp 16

## 2017-09-29 DIAGNOSIS — E1142 Type 2 diabetes mellitus with diabetic polyneuropathy: Secondary | ICD-10-CM | POA: Diagnosis not present

## 2017-09-29 DIAGNOSIS — I739 Peripheral vascular disease, unspecified: Secondary | ICD-10-CM | POA: Diagnosis not present

## 2017-09-29 DIAGNOSIS — L97522 Non-pressure chronic ulcer of other part of left foot with fat layer exposed: Secondary | ICD-10-CM | POA: Diagnosis not present

## 2017-09-29 NOTE — Progress Notes (Signed)
Subjective: [name] is a 77 y.o. female patient seen in office for follow up evaluation of ulcerations bilateral.  Patient has home nursing weekly, currently using customized compound powder to wounds, patient reports that it seems like the left foot wound is getting better and is healing up with little to no drainage. Patient denies nausea/fever/vomiting/chills/night sweats/shortness of breath/chest pain.  Patient has no other pedal complaints at this time.  Patient is assisted by currently at this visit.  Patient Active Problem List   Diagnosis Date Noted  . 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 PAF (paroxysmal atrial fibrillation) (HCC) 06/27/2017  . Coronary artery disease 12/08/2010  . Dyspnea 12/08/2010  . HYPERTENSION, BENIGN 04/01/2010  . RHEUMATOID ARTHRITIS 04/01/2010  . ABNORMAL CV (STRESS) TEST 04/01/2010  . LEG PAIN, RIGHT 03/12/2010   Current Outpatient Medications on File Prior to Visit  Medication Sig Dispense Refill  . amLODipine (NORVASC) 2.5 MG tablet Take 1 tablet (2.5 mg total) by mouth daily. 90 tablet 3  . aspirin 81 MG chewable tablet Chew 81 mg by mouth daily.    . B-D ULTRAFINE III SHORT PEN 31G X 8 MM MISC     . Cholecalciferol (VITAMIN D3) 2000 units TABS Take 1 tablet by mouth daily.    . cloNIDine (CATAPRES) 0.1 MG tablet Take 1 tablet by mouth Three times a day.    . diazepam (VALIUM) 5 MG tablet Take 5 mg by mouth 3 (three) times daily.      Marland Kitchen FREESTYLE LITE test strip     . furosemide (LASIX) 40 MG tablet Take 40 mg by mouth daily.      Marland Kitchen gabapentin (NEURONTIN) 100 MG capsule Take 100 mg by mouth 2 (two) times daily.    . insulin NPH-insulin regular (NOVOLIN 70/30) (70-30) 100 UNIT/ML injection Inject 39 units into the skin each morning and 6 units into the skin each evening.    . lansoprazole (PREVACID) 30 MG capsule Take 30 mg by mouth daily.      Marland Kitchen leflunomide (ARAVA) 10 MG tablet Take 10 mg by mouth daily.    Marland Kitchen levothyroxine (SYNTHROID,  LEVOTHROID) 75 MCG tablet Take 75 mcg by mouth daily.      Marland Kitchen lidocaine (LIDODERM) 5 % UNW AND APP 1 PA TO SKIN UP TO 16 H PER DAY  6  . metFORMIN (GLUCOPHAGE) 1000 MG tablet Take 500 mg by mouth 3 (three) times daily. Take 1/2 tab twice a day    . morphine (MS CONTIN) 15 MG 12 hr tablet Take by mouth.    . Multiple Vitamin (MULTIVITAMIN) tablet Take 1 tablet by mouth daily.    . mupirocin ointment (BACTROBAN) 2 % To right toe ulcer 30 g 0  . oxyCODONE (OXY IR/ROXICODONE) 5 MG immediate release tablet Take 5 mg by mouth 3 (three) times daily.     Marland Kitchen oxyCODONE (OXYCONTIN) 40 MG 12 hr tablet Take 40 mg by mouth every 8 (eight) hours.      . potassium chloride (KLOR-CON 10) 10 MEQ CR tablet Take 10 mEq by mouth 2 (two) times daily. When taking furosemide     . predniSONE (DELTASONE) 5 MG tablet Take 5 mg by mouth 2 (two) times daily.     . rosuvastatin (CRESTOR) 5 MG tablet Take 0.5 tablets (2.5 mg total) by mouth daily. 30 tablet 3  . spironolactone (ALDACTONE) 25 MG tablet Take 25 mg by mouth 2 (two) times daily.    . Vancomycin HCl (VANCOMYCIN 2.5 MG/ML +  HEPARIN 2500 UNITS/ML) 2.5 mLs by Intracatheter route.     Current Facility-Administered Medications on File Prior to Visit  Medication Dose Route Frequency Provider Last Rate Last Dose  . apixaban (ELIQUIS) tablet 5 mg  5 mg Oral BID Micki Riley, MD       Allergies  Allergen Reactions  . Gold Anaphylaxis    Swelling of lips/tongue, throat Swelling of lips/tongue, throat  . Nsaids Other (See Comments)    Intolerance Intolerance  . Sulfa Antibiotics Nausea Only  . Sulfonamide Derivatives   . Latex Other (See Comments)    blisters  . Sulfamethoxazole Nausea Only    Recent Results (from the past 2160 hour(s))  WOUND CULTURE     Status: Abnormal   Collection Time: 08/18/17  4:30 PM  Result Value Ref Range   Gram Stain Result Final report    Organism ID, Bacteria Comment     Comment: Few white blood cells.   Organism ID,  Bacteria Comment     Comment: Few gram negative diplococci.   Organism ID, Bacteria Comment     Comment: Few gram negative rods.   Aerobic Bacterial Culture Final report (A)    Organism ID, Bacteria Enterococcus faecalis (A)     Comment: Heavy growth   Organism ID, Bacteria Comment (A)     Comment: Corynebacterium species Heavy growth Susceptibility not normally performed on this organism.    Antimicrobial Susceptibility Comment     Comment:       ** S = Susceptible; I = Intermediate; R = Resistant **                    P = Positive; N = Negative             MICS are expressed in micrograms per mL    Antibiotic                 RSLT#1    RSLT#2    RSLT#3    RSLT#4 Penicillin                     S Vancomycin                     S     Objective: There were no vitals filed for this visit.  General: Patient is awake, alert, oriented x 3 and in no acute distress.  Dermatology: Skin is warm and dry bilateral with continued healed ulcerations that have skin callus or scabbed over on the right foot. No other acute signs of infection.   To the left foot at the third toe there is healed over continues to be pre-ulcerative in nature.  To the plantar left forefoot there is a ulceration that measures 0.8 cm x 0.6 cm x 0.4 cm (smaller than previous) probes less to the joint capsule with a granular base and mildly keratotic margins with maceration and improved drainage, no other acute signs of infection.  Vascular: Dorsalis Pedis pulse = 1/4 Bilateral,  Posterior Tibial pulse = 0/4 Bilateral,  Capillary Fill Time < 5 seconds. Chronic venous skin changes.  Neurologic: Protective sensation absent to the level of the ankles bilateral using  the 5.07/10g [employer].  Musculosketal: No Pain with palpation to ulcerated area on the left. No pain with compression to calves bilateral. Planus and digital deformity with rigid hallux extensus on right.   No results for input(s):  GRAMSTAIN, LABORGA in the last  8760 hours.  Assessment and Plan:  Problem List Items Addressed This Visit    None    Visit Diagnoses    Ulcer of left foot, with fat layer exposed (HCC)    -  Primary   Diabetic polyneuropathy associated with type 2 diabetes mellitus (HCC)       PVD (peripheral vascular disease) (HCC)         -Examined patient and re-discussed the progression of the wound and treatment alternatives.  Excisionally dedbrided ulceration on left foot to healthy bleeding borders removing nonviable tissue using a sterile chisel blade. Wound measure post debridement as above.  Wound at left foot was debrided to the level of the dermis with viable wound base exposed to promote healing. Hemostasis was achieved with manuel pressure. Patient tolerated procedure well without any discomfort or anesthesia necessary for this wound debridement.  -To left plantar forefoot applied dry dressing and nursing to apply antibiotic impregnated wound powder that was ordered from [facility] covered with dry dressing and offloading padding as scheduled -Continue home PT to tolerance for gait and stability training  -Continue with postop shoes -Advised patient to go to the ER or return to office if the wound worsens or if constitutional symptoms are present. -Patient to return to office as scheduled in 2-3 weeks for follow up care/wound evaluation or sooner if problems arise.  Asencion Islam, DPM

## 2017-09-30 DIAGNOSIS — I10 Essential (primary) hypertension: Secondary | ICD-10-CM | POA: Diagnosis not present

## 2017-09-30 DIAGNOSIS — E11621 Type 2 diabetes mellitus with foot ulcer: Secondary | ICD-10-CM | POA: Diagnosis not present

## 2017-09-30 DIAGNOSIS — L97422 Non-pressure chronic ulcer of left heel and midfoot with fat layer exposed: Secondary | ICD-10-CM | POA: Diagnosis not present

## 2017-09-30 DIAGNOSIS — I69311 Memory deficit following cerebral infarction: Secondary | ICD-10-CM | POA: Diagnosis not present

## 2017-09-30 DIAGNOSIS — M069 Rheumatoid arthritis, unspecified: Secondary | ICD-10-CM | POA: Diagnosis not present

## 2017-09-30 DIAGNOSIS — I69312 Visuospatial deficit and spatial neglect following cerebral infarction: Secondary | ICD-10-CM | POA: Diagnosis not present

## 2017-10-03 DIAGNOSIS — M069 Rheumatoid arthritis, unspecified: Secondary | ICD-10-CM | POA: Diagnosis not present

## 2017-10-03 DIAGNOSIS — L97422 Non-pressure chronic ulcer of left heel and midfoot with fat layer exposed: Secondary | ICD-10-CM | POA: Diagnosis not present

## 2017-10-03 DIAGNOSIS — I69312 Visuospatial deficit and spatial neglect following cerebral infarction: Secondary | ICD-10-CM | POA: Diagnosis not present

## 2017-10-03 DIAGNOSIS — I10 Essential (primary) hypertension: Secondary | ICD-10-CM | POA: Diagnosis not present

## 2017-10-03 DIAGNOSIS — I69311 Memory deficit following cerebral infarction: Secondary | ICD-10-CM | POA: Diagnosis not present

## 2017-10-03 DIAGNOSIS — E11621 Type 2 diabetes mellitus with foot ulcer: Secondary | ICD-10-CM | POA: Diagnosis not present

## 2017-10-04 ENCOUNTER — Ambulatory Visit: Payer: Medicare Other | Admitting: Neurology

## 2017-10-04 DIAGNOSIS — R918 Other nonspecific abnormal finding of lung field: Secondary | ICD-10-CM | POA: Diagnosis not present

## 2017-10-04 DIAGNOSIS — J90 Pleural effusion, not elsewhere classified: Secondary | ICD-10-CM | POA: Diagnosis not present

## 2017-10-05 DIAGNOSIS — I10 Essential (primary) hypertension: Secondary | ICD-10-CM | POA: Diagnosis not present

## 2017-10-05 DIAGNOSIS — I69312 Visuospatial deficit and spatial neglect following cerebral infarction: Secondary | ICD-10-CM | POA: Diagnosis not present

## 2017-10-05 DIAGNOSIS — M069 Rheumatoid arthritis, unspecified: Secondary | ICD-10-CM | POA: Diagnosis not present

## 2017-10-05 DIAGNOSIS — E11621 Type 2 diabetes mellitus with foot ulcer: Secondary | ICD-10-CM | POA: Diagnosis not present

## 2017-10-05 DIAGNOSIS — I69311 Memory deficit following cerebral infarction: Secondary | ICD-10-CM | POA: Diagnosis not present

## 2017-10-05 DIAGNOSIS — L97422 Non-pressure chronic ulcer of left heel and midfoot with fat layer exposed: Secondary | ICD-10-CM | POA: Diagnosis not present

## 2017-10-07 DIAGNOSIS — E11621 Type 2 diabetes mellitus with foot ulcer: Secondary | ICD-10-CM | POA: Diagnosis not present

## 2017-10-07 DIAGNOSIS — M069 Rheumatoid arthritis, unspecified: Secondary | ICD-10-CM | POA: Diagnosis not present

## 2017-10-07 DIAGNOSIS — I69312 Visuospatial deficit and spatial neglect following cerebral infarction: Secondary | ICD-10-CM | POA: Diagnosis not present

## 2017-10-07 DIAGNOSIS — I69311 Memory deficit following cerebral infarction: Secondary | ICD-10-CM | POA: Diagnosis not present

## 2017-10-07 DIAGNOSIS — I10 Essential (primary) hypertension: Secondary | ICD-10-CM | POA: Diagnosis not present

## 2017-10-07 DIAGNOSIS — L97422 Non-pressure chronic ulcer of left heel and midfoot with fat layer exposed: Secondary | ICD-10-CM | POA: Diagnosis not present

## 2017-10-10 DIAGNOSIS — M069 Rheumatoid arthritis, unspecified: Secondary | ICD-10-CM | POA: Diagnosis not present

## 2017-10-10 DIAGNOSIS — I69311 Memory deficit following cerebral infarction: Secondary | ICD-10-CM | POA: Diagnosis not present

## 2017-10-10 DIAGNOSIS — L97422 Non-pressure chronic ulcer of left heel and midfoot with fat layer exposed: Secondary | ICD-10-CM | POA: Diagnosis not present

## 2017-10-10 DIAGNOSIS — I10 Essential (primary) hypertension: Secondary | ICD-10-CM | POA: Diagnosis not present

## 2017-10-10 DIAGNOSIS — E11621 Type 2 diabetes mellitus with foot ulcer: Secondary | ICD-10-CM | POA: Diagnosis not present

## 2017-10-10 DIAGNOSIS — I69312 Visuospatial deficit and spatial neglect following cerebral infarction: Secondary | ICD-10-CM | POA: Diagnosis not present

## 2017-10-11 DIAGNOSIS — N3001 Acute cystitis with hematuria: Secondary | ICD-10-CM | POA: Diagnosis not present

## 2017-10-11 DIAGNOSIS — Z6826 Body mass index (BMI) 26.0-26.9, adult: Secondary | ICD-10-CM | POA: Diagnosis not present

## 2017-10-12 DIAGNOSIS — I69311 Memory deficit following cerebral infarction: Secondary | ICD-10-CM | POA: Diagnosis not present

## 2017-10-12 DIAGNOSIS — I69312 Visuospatial deficit and spatial neglect following cerebral infarction: Secondary | ICD-10-CM | POA: Diagnosis not present

## 2017-10-12 DIAGNOSIS — M069 Rheumatoid arthritis, unspecified: Secondary | ICD-10-CM | POA: Diagnosis not present

## 2017-10-12 DIAGNOSIS — L97422 Non-pressure chronic ulcer of left heel and midfoot with fat layer exposed: Secondary | ICD-10-CM | POA: Diagnosis not present

## 2017-10-12 DIAGNOSIS — I10 Essential (primary) hypertension: Secondary | ICD-10-CM | POA: Diagnosis not present

## 2017-10-12 DIAGNOSIS — E11621 Type 2 diabetes mellitus with foot ulcer: Secondary | ICD-10-CM | POA: Diagnosis not present

## 2017-10-14 DIAGNOSIS — M069 Rheumatoid arthritis, unspecified: Secondary | ICD-10-CM | POA: Diagnosis not present

## 2017-10-14 DIAGNOSIS — L97422 Non-pressure chronic ulcer of left heel and midfoot with fat layer exposed: Secondary | ICD-10-CM | POA: Diagnosis not present

## 2017-10-14 DIAGNOSIS — I69312 Visuospatial deficit and spatial neglect following cerebral infarction: Secondary | ICD-10-CM | POA: Diagnosis not present

## 2017-10-14 DIAGNOSIS — I10 Essential (primary) hypertension: Secondary | ICD-10-CM | POA: Diagnosis not present

## 2017-10-14 DIAGNOSIS — I69311 Memory deficit following cerebral infarction: Secondary | ICD-10-CM | POA: Diagnosis not present

## 2017-10-14 DIAGNOSIS — E11621 Type 2 diabetes mellitus with foot ulcer: Secondary | ICD-10-CM | POA: Diagnosis not present

## 2017-10-17 DIAGNOSIS — L97422 Non-pressure chronic ulcer of left heel and midfoot with fat layer exposed: Secondary | ICD-10-CM | POA: Diagnosis not present

## 2017-10-17 DIAGNOSIS — I69311 Memory deficit following cerebral infarction: Secondary | ICD-10-CM | POA: Diagnosis not present

## 2017-10-17 DIAGNOSIS — M069 Rheumatoid arthritis, unspecified: Secondary | ICD-10-CM | POA: Diagnosis not present

## 2017-10-17 DIAGNOSIS — I10 Essential (primary) hypertension: Secondary | ICD-10-CM | POA: Diagnosis not present

## 2017-10-17 DIAGNOSIS — E11621 Type 2 diabetes mellitus with foot ulcer: Secondary | ICD-10-CM | POA: Diagnosis not present

## 2017-10-17 DIAGNOSIS — I69312 Visuospatial deficit and spatial neglect following cerebral infarction: Secondary | ICD-10-CM | POA: Diagnosis not present

## 2017-10-19 DIAGNOSIS — E11621 Type 2 diabetes mellitus with foot ulcer: Secondary | ICD-10-CM | POA: Diagnosis not present

## 2017-10-19 DIAGNOSIS — I10 Essential (primary) hypertension: Secondary | ICD-10-CM | POA: Diagnosis not present

## 2017-10-19 DIAGNOSIS — I69312 Visuospatial deficit and spatial neglect following cerebral infarction: Secondary | ICD-10-CM | POA: Diagnosis not present

## 2017-10-19 DIAGNOSIS — M069 Rheumatoid arthritis, unspecified: Secondary | ICD-10-CM | POA: Diagnosis not present

## 2017-10-19 DIAGNOSIS — L97422 Non-pressure chronic ulcer of left heel and midfoot with fat layer exposed: Secondary | ICD-10-CM | POA: Diagnosis not present

## 2017-10-19 DIAGNOSIS — I69311 Memory deficit following cerebral infarction: Secondary | ICD-10-CM | POA: Diagnosis not present

## 2017-10-20 ENCOUNTER — Encounter: Payer: Self-pay | Admitting: Sports Medicine

## 2017-10-20 ENCOUNTER — Ambulatory Visit (INDEPENDENT_AMBULATORY_CARE_PROVIDER_SITE_OTHER): Payer: Medicare Other | Admitting: Sports Medicine

## 2017-10-20 VITALS — BP 166/74 | HR 72 | Temp 98.5°F | Resp 16

## 2017-10-20 DIAGNOSIS — E1142 Type 2 diabetes mellitus with diabetic polyneuropathy: Secondary | ICD-10-CM

## 2017-10-20 DIAGNOSIS — M8580 Other specified disorders of bone density and structure, unspecified site: Secondary | ICD-10-CM | POA: Diagnosis not present

## 2017-10-20 DIAGNOSIS — E1140 Type 2 diabetes mellitus with diabetic neuropathy, unspecified: Secondary | ICD-10-CM | POA: Diagnosis not present

## 2017-10-20 DIAGNOSIS — G5600 Carpal tunnel syndrome, unspecified upper limb: Secondary | ICD-10-CM | POA: Diagnosis not present

## 2017-10-20 DIAGNOSIS — M4310 Spondylolisthesis, site unspecified: Secondary | ICD-10-CM | POA: Diagnosis not present

## 2017-10-20 DIAGNOSIS — M059 Rheumatoid arthritis with rheumatoid factor, unspecified: Secondary | ICD-10-CM | POA: Diagnosis not present

## 2017-10-20 DIAGNOSIS — L97521 Non-pressure chronic ulcer of other part of left foot limited to breakdown of skin: Secondary | ICD-10-CM | POA: Diagnosis not present

## 2017-10-20 DIAGNOSIS — M0579 Rheumatoid arthritis with rheumatoid factor of multiple sites without organ or systems involvement: Secondary | ICD-10-CM | POA: Diagnosis not present

## 2017-10-20 DIAGNOSIS — K869 Disease of pancreas, unspecified: Secondary | ICD-10-CM | POA: Diagnosis not present

## 2017-10-20 DIAGNOSIS — M3500 Sicca syndrome, unspecified: Secondary | ICD-10-CM | POA: Diagnosis not present

## 2017-10-20 DIAGNOSIS — Z87440 Personal history of urinary (tract) infections: Secondary | ICD-10-CM | POA: Diagnosis not present

## 2017-10-20 DIAGNOSIS — J90 Pleural effusion, not elsewhere classified: Secondary | ICD-10-CM | POA: Diagnosis not present

## 2017-10-20 DIAGNOSIS — I739 Peripheral vascular disease, unspecified: Secondary | ICD-10-CM

## 2017-10-20 DIAGNOSIS — Z79899 Other long term (current) drug therapy: Secondary | ICD-10-CM | POA: Diagnosis not present

## 2017-10-20 DIAGNOSIS — M21949 Unspecified acquired deformity of hand, unspecified hand: Secondary | ICD-10-CM | POA: Diagnosis not present

## 2017-10-20 DIAGNOSIS — I872 Venous insufficiency (chronic) (peripheral): Secondary | ICD-10-CM | POA: Diagnosis not present

## 2017-10-20 NOTE — Progress Notes (Signed)
Subjective: [name] is a 77 y.o. female patient seen in office for follow up evaluation of ulcerations bilateral. Ulcerations remain healed however there is still an ulceration to the left plantar forefoot.  Patient has home nursing weekly, currently using customized compound powder to wound on the left, patient reports that wounds are getting better and is healing up. Patient denies nausea/fever/vomiting/chills/night sweats/shortness of breath/chest pain.  Patient has no other pedal complaints at this time.  Patient is assisted by Son-in-law at this visit.  Patient Active Problem List   Diagnosis Date Noted  . 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 PAF (paroxysmal atrial fibrillation) (HCC) 06/27/2017  . Coronary artery disease 12/08/2010  . Dyspnea 12/08/2010  . HYPERTENSION, BENIGN 04/01/2010  . RHEUMATOID ARTHRITIS 04/01/2010  . ABNORMAL CV (STRESS) TEST 04/01/2010  . LEG PAIN, RIGHT 03/12/2010   Current Outpatient Medications on File Prior to Visit  Medication Sig Dispense Refill  . aspirin 81 MG chewable tablet Chew 81 mg by mouth daily.    . B-D ULTRAFINE III SHORT PEN 31G X 8 MM MISC     . Cholecalciferol (VITAMIN D3) 2000 units TABS Take 1 tablet by mouth daily.    . cloNIDine (CATAPRES) 0.1 MG tablet Take 1 tablet by mouth Three times a day.    . diazepam (VALIUM) 5 MG tablet Take 5 mg by mouth 3 (three) times daily.      Marland Kitchen FREESTYLE LITE test strip     . furosemide (LASIX) 40 MG tablet Take 40 mg by mouth daily.      Marland Kitchen gabapentin (NEURONTIN) 100 MG capsule Take 100 mg by mouth 2 (two) times daily.    . insulin NPH-insulin regular (NOVOLIN 70/30) (70-30) 100 UNIT/ML injection Inject 39 units into the skin each morning and 6 units into the skin each evening.    . lansoprazole (PREVACID) 30 MG capsule Take 30 mg by mouth daily.      Marland Kitchen leflunomide (ARAVA) 10 MG tablet Take 10 mg by mouth daily.    Marland Kitchen levothyroxine (SYNTHROID, LEVOTHROID) 75 MCG tablet Take 75 mcg by  mouth daily.      Marland Kitchen lidocaine (LIDODERM) 5 % UNW AND APP 1 PA TO SKIN UP TO 16 H PER DAY  6  . metFORMIN (GLUCOPHAGE) 1000 MG tablet Take 500 mg by mouth 3 (three) times daily. Take 1/2 tab twice a day    . morphine (MS CONTIN) 15 MG 12 hr tablet Take by mouth.    . Multiple Vitamin (MULTIVITAMIN) tablet Take 1 tablet by mouth daily.    . mupirocin ointment (BACTROBAN) 2 % To right toe ulcer 30 g 0  . oxyCODONE (OXY IR/ROXICODONE) 5 MG immediate release tablet Take 5 mg by mouth 3 (three) times daily.     Marland Kitchen oxyCODONE (OXYCONTIN) 40 MG 12 hr tablet Take 40 mg by mouth every 8 (eight) hours.      . potassium chloride (KLOR-CON 10) 10 MEQ CR tablet Take 10 mEq by mouth 2 (two) times daily. When taking furosemide     . predniSONE (DELTASONE) 5 MG tablet Take 5 mg by mouth 2 (two) times daily.     . rosuvastatin (CRESTOR) 5 MG tablet Take 0.5 tablets (2.5 mg total) by mouth daily. 30 tablet 3  . spironolactone (ALDACTONE) 25 MG tablet Take 25 mg by mouth 2 (two) times daily.    . Vancomycin HCl (VANCOMYCIN 2.5 MG/ML + HEPARIN 2500 UNITS/ML) 2.5 mLs by Intracatheter route.    Marland Kitchen amLODipine (  NORVASC) 2.5 MG tablet Take 1 tablet (2.5 mg total) by mouth daily. 90 tablet 3   Current Facility-Administered Medications on File Prior to Visit  Medication Dose Route Frequency Provider Last Rate Last Dose  . apixaban (ELIQUIS) tablet 5 mg  5 mg Oral BID Micki Riley, MD       Allergies  Allergen Reactions  . Gold Anaphylaxis    Swelling of lips/tongue, throat Swelling of lips/tongue, throat  . Nsaids Other (See Comments)    Intolerance Intolerance  . Sulfa Antibiotics Nausea Only  . Sulfonamide Derivatives   . Latex Other (See Comments)    blisters  . Sulfamethoxazole Nausea Only    Recent Results (from the past 2160 hour(s))  WOUND CULTURE     Status: Abnormal   Collection Time: 08/18/17  4:30 PM  Result Value Ref Range   Gram Stain Result Final report    Organism ID, Bacteria Comment      Comment: Few white blood cells.   Organism ID, Bacteria Comment     Comment: Few gram negative diplococci.   Organism ID, Bacteria Comment     Comment: Few gram negative rods.   Aerobic Bacterial Culture Final report (A)    Organism ID, Bacteria Enterococcus faecalis (A)     Comment: Heavy growth   Organism ID, Bacteria Comment (A)     Comment: Corynebacterium species Heavy growth Susceptibility not normally performed on this organism.    Antimicrobial Susceptibility Comment     Comment:       ** S = Susceptible; I = Intermediate; R = Resistant **                    P = Positive; N = Negative             MICS are expressed in micrograms per mL    Antibiotic                 RSLT#1    RSLT#2    RSLT#3    RSLT#4 Penicillin                     S Vancomycin                     S     Objective: There were no vitals filed for this visit.  General: Patient is awake, alert, oriented x 3 and in no acute distress.  Dermatology: Skin is warm and dry bilateral with continued healed ulcerations that have skin callus or scabbed over on the right foot. No other acute signs of infection.    To the plantar left forefoot there is a ulceration that measures 0.4 cm x 0.3 cm x 0.1 cm (smaller than previous) with a granular base and mildly keratotic margins with maceration and improved drainage, no other acute signs of infection.  Vascular: Dorsalis Pedis pulse = 1/4 Bilateral,  Posterior Tibial pulse = 0/4 Bilateral,  Capillary Fill Time < 5 seconds. Chronic venous skin changes.  Neurologic: Protective sensation absent to the level of the ankles bilateral using  the 5.07/10g [employer].  Musculosketal: No Pain with palpation to ulcerated area on the left. No pain with compression to calves bilateral. Planus and digital deformity with rigid hallux extensus on right.   No results for input(s): GRAMSTAIN, LABORGA in the last 8760 hours.  Assessment and Plan:  Problem List Items  Addressed This Visit    None  Visit Diagnoses    Ulcer of left foot, limited to breakdown of skin (HCC)    -  Primary   Diabetic polyneuropathy associated with type 2 diabetes mellitus (HCC)       PVD (peripheral vascular disease) (HCC)         -Examined patient and re-discussed the progression of the wound and treatment alternatives.  Excisionally dedbrided ulceration on left foot to healthy bleeding borders removing nonviable tissue using a sterile chisel blade. Wound measure post debridement as above.  Wound at left foot was debrided to the level of the dermis with viable wound base exposed to promote healing. Hemostasis was achieved with manuel pressure. Patient tolerated procedure well without any discomfort or anesthesia necessary for this wound debridement.  -To left plantar forefoot applied compound powder and 2x2 gauze and paper tape with offloading padding -To the right foot applied protective offloading padding -Continue with walker for stability in gait -Continue with postop shoes -Advised patient to go to the ER or return to office if the wound worsens or if constitutional symptoms are present. -Patient to return to office as scheduled in 3-4 weeks for follow up care/wound evaluation or sooner if problems arise.  Asencion Islam, DPM

## 2017-10-24 DIAGNOSIS — I10 Essential (primary) hypertension: Secondary | ICD-10-CM | POA: Diagnosis not present

## 2017-10-24 DIAGNOSIS — I69312 Visuospatial deficit and spatial neglect following cerebral infarction: Secondary | ICD-10-CM | POA: Diagnosis not present

## 2017-10-24 DIAGNOSIS — E11621 Type 2 diabetes mellitus with foot ulcer: Secondary | ICD-10-CM | POA: Diagnosis not present

## 2017-10-24 DIAGNOSIS — I69311 Memory deficit following cerebral infarction: Secondary | ICD-10-CM | POA: Diagnosis not present

## 2017-10-24 DIAGNOSIS — M069 Rheumatoid arthritis, unspecified: Secondary | ICD-10-CM | POA: Diagnosis not present

## 2017-10-24 DIAGNOSIS — L97422 Non-pressure chronic ulcer of left heel and midfoot with fat layer exposed: Secondary | ICD-10-CM | POA: Diagnosis not present

## 2017-10-26 DIAGNOSIS — L97422 Non-pressure chronic ulcer of left heel and midfoot with fat layer exposed: Secondary | ICD-10-CM | POA: Diagnosis not present

## 2017-10-26 DIAGNOSIS — I69312 Visuospatial deficit and spatial neglect following cerebral infarction: Secondary | ICD-10-CM | POA: Diagnosis not present

## 2017-10-26 DIAGNOSIS — I10 Essential (primary) hypertension: Secondary | ICD-10-CM | POA: Diagnosis not present

## 2017-10-26 DIAGNOSIS — E11621 Type 2 diabetes mellitus with foot ulcer: Secondary | ICD-10-CM | POA: Diagnosis not present

## 2017-10-26 DIAGNOSIS — M069 Rheumatoid arthritis, unspecified: Secondary | ICD-10-CM | POA: Diagnosis not present

## 2017-10-26 DIAGNOSIS — I69311 Memory deficit following cerebral infarction: Secondary | ICD-10-CM | POA: Diagnosis not present

## 2017-10-28 DIAGNOSIS — M069 Rheumatoid arthritis, unspecified: Secondary | ICD-10-CM | POA: Diagnosis not present

## 2017-10-28 DIAGNOSIS — I69311 Memory deficit following cerebral infarction: Secondary | ICD-10-CM | POA: Diagnosis not present

## 2017-10-28 DIAGNOSIS — I10 Essential (primary) hypertension: Secondary | ICD-10-CM | POA: Diagnosis not present

## 2017-10-28 DIAGNOSIS — E11621 Type 2 diabetes mellitus with foot ulcer: Secondary | ICD-10-CM | POA: Diagnosis not present

## 2017-10-28 DIAGNOSIS — L97422 Non-pressure chronic ulcer of left heel and midfoot with fat layer exposed: Secondary | ICD-10-CM | POA: Diagnosis not present

## 2017-10-28 DIAGNOSIS — I69312 Visuospatial deficit and spatial neglect following cerebral infarction: Secondary | ICD-10-CM | POA: Diagnosis not present

## 2017-10-31 DIAGNOSIS — E11621 Type 2 diabetes mellitus with foot ulcer: Secondary | ICD-10-CM | POA: Diagnosis not present

## 2017-10-31 DIAGNOSIS — L97422 Non-pressure chronic ulcer of left heel and midfoot with fat layer exposed: Secondary | ICD-10-CM | POA: Diagnosis not present

## 2017-10-31 DIAGNOSIS — I69311 Memory deficit following cerebral infarction: Secondary | ICD-10-CM | POA: Diagnosis not present

## 2017-10-31 DIAGNOSIS — I10 Essential (primary) hypertension: Secondary | ICD-10-CM | POA: Diagnosis not present

## 2017-10-31 DIAGNOSIS — I69312 Visuospatial deficit and spatial neglect following cerebral infarction: Secondary | ICD-10-CM | POA: Diagnosis not present

## 2017-10-31 DIAGNOSIS — M069 Rheumatoid arthritis, unspecified: Secondary | ICD-10-CM | POA: Diagnosis not present

## 2017-11-01 ENCOUNTER — Telehealth: Payer: Self-pay | Admitting: Sports Medicine

## 2017-11-01 DIAGNOSIS — H26491 Other secondary cataract, right eye: Secondary | ICD-10-CM | POA: Diagnosis not present

## 2017-11-01 DIAGNOSIS — H25812 Combined forms of age-related cataract, left eye: Secondary | ICD-10-CM | POA: Diagnosis not present

## 2017-11-01 NOTE — Telephone Encounter (Signed)
This is Melissa, Charity fundraiser with [facility]. We went back out to do Ms. Cannata's re-cert period for her home health services. We are going to continue her on services for 3 times a week for the next 8 weeks to try and continue with the wound care to her foot. If someone could contact me back at [phone] with verbal orders approval so that we may see the pt. Thanks.

## 2017-11-01 NOTE — Telephone Encounter (Signed)
I informed Melissa - [facility], Dr. Marylene Land ordered continue the [facility] as previously ordered for the next 8 weeks.

## 2017-11-02 DIAGNOSIS — E1142 Type 2 diabetes mellitus with diabetic polyneuropathy: Secondary | ICD-10-CM | POA: Diagnosis not present

## 2017-11-02 DIAGNOSIS — I69311 Memory deficit following cerebral infarction: Secondary | ICD-10-CM | POA: Diagnosis not present

## 2017-11-02 DIAGNOSIS — L97422 Non-pressure chronic ulcer of left heel and midfoot with fat layer exposed: Secondary | ICD-10-CM | POA: Diagnosis not present

## 2017-11-02 DIAGNOSIS — I69312 Visuospatial deficit and spatial neglect following cerebral infarction: Secondary | ICD-10-CM | POA: Diagnosis not present

## 2017-11-02 DIAGNOSIS — I10 Essential (primary) hypertension: Secondary | ICD-10-CM | POA: Diagnosis not present

## 2017-11-02 DIAGNOSIS — I739 Peripheral vascular disease, unspecified: Secondary | ICD-10-CM | POA: Diagnosis not present

## 2017-11-02 DIAGNOSIS — R600 Localized edema: Secondary | ICD-10-CM | POA: Diagnosis not present

## 2017-11-02 DIAGNOSIS — M069 Rheumatoid arthritis, unspecified: Secondary | ICD-10-CM | POA: Diagnosis not present

## 2017-11-02 DIAGNOSIS — L97921 Non-pressure chronic ulcer of unspecified part of left lower leg limited to breakdown of skin: Secondary | ICD-10-CM | POA: Diagnosis not present

## 2017-11-02 DIAGNOSIS — E11621 Type 2 diabetes mellitus with foot ulcer: Secondary | ICD-10-CM | POA: Diagnosis not present

## 2017-11-04 DIAGNOSIS — L97422 Non-pressure chronic ulcer of left heel and midfoot with fat layer exposed: Secondary | ICD-10-CM | POA: Diagnosis not present

## 2017-11-04 DIAGNOSIS — I10 Essential (primary) hypertension: Secondary | ICD-10-CM | POA: Diagnosis not present

## 2017-11-04 DIAGNOSIS — I69312 Visuospatial deficit and spatial neglect following cerebral infarction: Secondary | ICD-10-CM | POA: Diagnosis not present

## 2017-11-04 DIAGNOSIS — M069 Rheumatoid arthritis, unspecified: Secondary | ICD-10-CM | POA: Diagnosis not present

## 2017-11-04 DIAGNOSIS — E11621 Type 2 diabetes mellitus with foot ulcer: Secondary | ICD-10-CM | POA: Diagnosis not present

## 2017-11-04 DIAGNOSIS — I69311 Memory deficit following cerebral infarction: Secondary | ICD-10-CM | POA: Diagnosis not present

## 2017-11-07 DIAGNOSIS — I69311 Memory deficit following cerebral infarction: Secondary | ICD-10-CM | POA: Diagnosis not present

## 2017-11-07 DIAGNOSIS — I10 Essential (primary) hypertension: Secondary | ICD-10-CM | POA: Diagnosis not present

## 2017-11-07 DIAGNOSIS — M069 Rheumatoid arthritis, unspecified: Secondary | ICD-10-CM | POA: Diagnosis not present

## 2017-11-07 DIAGNOSIS — E11621 Type 2 diabetes mellitus with foot ulcer: Secondary | ICD-10-CM | POA: Diagnosis not present

## 2017-11-07 DIAGNOSIS — I69312 Visuospatial deficit and spatial neglect following cerebral infarction: Secondary | ICD-10-CM | POA: Diagnosis not present

## 2017-11-07 DIAGNOSIS — L97422 Non-pressure chronic ulcer of left heel and midfoot with fat layer exposed: Secondary | ICD-10-CM | POA: Diagnosis not present

## 2017-11-09 DIAGNOSIS — M069 Rheumatoid arthritis, unspecified: Secondary | ICD-10-CM | POA: Diagnosis not present

## 2017-11-09 DIAGNOSIS — I69311 Memory deficit following cerebral infarction: Secondary | ICD-10-CM | POA: Diagnosis not present

## 2017-11-09 DIAGNOSIS — I10 Essential (primary) hypertension: Secondary | ICD-10-CM | POA: Diagnosis not present

## 2017-11-09 DIAGNOSIS — I69312 Visuospatial deficit and spatial neglect following cerebral infarction: Secondary | ICD-10-CM | POA: Diagnosis not present

## 2017-11-09 DIAGNOSIS — E11621 Type 2 diabetes mellitus with foot ulcer: Secondary | ICD-10-CM | POA: Diagnosis not present

## 2017-11-09 DIAGNOSIS — L97422 Non-pressure chronic ulcer of left heel and midfoot with fat layer exposed: Secondary | ICD-10-CM | POA: Diagnosis not present

## 2017-11-11 DIAGNOSIS — J918 Pleural effusion in other conditions classified elsewhere: Secondary | ICD-10-CM | POA: Diagnosis not present

## 2017-11-11 DIAGNOSIS — I10 Essential (primary) hypertension: Secondary | ICD-10-CM | POA: Diagnosis not present

## 2017-11-11 DIAGNOSIS — M545 Low back pain: Secondary | ICD-10-CM | POA: Diagnosis not present

## 2017-11-11 DIAGNOSIS — L97422 Non-pressure chronic ulcer of left heel and midfoot with fat layer exposed: Secondary | ICD-10-CM | POA: Diagnosis not present

## 2017-11-11 DIAGNOSIS — I69311 Memory deficit following cerebral infarction: Secondary | ICD-10-CM | POA: Diagnosis not present

## 2017-11-11 DIAGNOSIS — E11621 Type 2 diabetes mellitus with foot ulcer: Secondary | ICD-10-CM | POA: Diagnosis not present

## 2017-11-11 DIAGNOSIS — M069 Rheumatoid arthritis, unspecified: Secondary | ICD-10-CM | POA: Diagnosis not present

## 2017-11-11 DIAGNOSIS — H6691 Otitis media, unspecified, right ear: Secondary | ICD-10-CM | POA: Diagnosis not present

## 2017-11-11 DIAGNOSIS — R531 Weakness: Secondary | ICD-10-CM | POA: Diagnosis not present

## 2017-11-11 DIAGNOSIS — I69312 Visuospatial deficit and spatial neglect following cerebral infarction: Secondary | ICD-10-CM | POA: Diagnosis not present

## 2017-11-11 DIAGNOSIS — R42 Dizziness and giddiness: Secondary | ICD-10-CM | POA: Diagnosis not present

## 2017-11-14 DIAGNOSIS — L97422 Non-pressure chronic ulcer of left heel and midfoot with fat layer exposed: Secondary | ICD-10-CM | POA: Diagnosis not present

## 2017-11-14 DIAGNOSIS — I69312 Visuospatial deficit and spatial neglect following cerebral infarction: Secondary | ICD-10-CM | POA: Diagnosis not present

## 2017-11-14 DIAGNOSIS — I10 Essential (primary) hypertension: Secondary | ICD-10-CM | POA: Diagnosis not present

## 2017-11-14 DIAGNOSIS — M069 Rheumatoid arthritis, unspecified: Secondary | ICD-10-CM | POA: Diagnosis not present

## 2017-11-14 DIAGNOSIS — I69311 Memory deficit following cerebral infarction: Secondary | ICD-10-CM | POA: Diagnosis not present

## 2017-11-14 DIAGNOSIS — E11621 Type 2 diabetes mellitus with foot ulcer: Secondary | ICD-10-CM | POA: Diagnosis not present

## 2017-11-15 ENCOUNTER — Ambulatory Visit: Payer: Medicare Other | Admitting: Neurology

## 2017-11-16 DIAGNOSIS — L97929 Non-pressure chronic ulcer of unspecified part of left lower leg with unspecified severity: Secondary | ICD-10-CM | POA: Diagnosis not present

## 2017-11-16 DIAGNOSIS — L97422 Non-pressure chronic ulcer of left heel and midfoot with fat layer exposed: Secondary | ICD-10-CM | POA: Diagnosis not present

## 2017-11-16 DIAGNOSIS — E11621 Type 2 diabetes mellitus with foot ulcer: Secondary | ICD-10-CM | POA: Diagnosis not present

## 2017-11-16 DIAGNOSIS — I83892 Varicose veins of left lower extremities with other complications: Secondary | ICD-10-CM | POA: Diagnosis not present

## 2017-11-16 DIAGNOSIS — I69311 Memory deficit following cerebral infarction: Secondary | ICD-10-CM | POA: Diagnosis not present

## 2017-11-16 DIAGNOSIS — I10 Essential (primary) hypertension: Secondary | ICD-10-CM | POA: Diagnosis not present

## 2017-11-16 DIAGNOSIS — I83029 Varicose veins of left lower extremity with ulcer of unspecified site: Secondary | ICD-10-CM | POA: Diagnosis not present

## 2017-11-16 DIAGNOSIS — M069 Rheumatoid arthritis, unspecified: Secondary | ICD-10-CM | POA: Diagnosis not present

## 2017-11-16 DIAGNOSIS — I69312 Visuospatial deficit and spatial neglect following cerebral infarction: Secondary | ICD-10-CM | POA: Diagnosis not present

## 2017-11-16 DIAGNOSIS — K219 Gastro-esophageal reflux disease without esophagitis: Secondary | ICD-10-CM | POA: Diagnosis not present

## 2017-11-17 ENCOUNTER — Ambulatory Visit: Payer: Medicare Other | Admitting: Neurology

## 2017-11-17 ENCOUNTER — Ambulatory Visit: Payer: Medicare Other | Admitting: Sports Medicine

## 2017-11-18 ENCOUNTER — Encounter: Payer: Self-pay | Admitting: Sports Medicine

## 2017-11-18 ENCOUNTER — Telehealth: Payer: Self-pay | Admitting: *Deleted

## 2017-11-18 ENCOUNTER — Ambulatory Visit (INDEPENDENT_AMBULATORY_CARE_PROVIDER_SITE_OTHER): Payer: Medicare Other | Admitting: Sports Medicine

## 2017-11-18 VITALS — BP 136/68 | HR 68 | Temp 97.5°F | Resp 16

## 2017-11-18 DIAGNOSIS — E1142 Type 2 diabetes mellitus with diabetic polyneuropathy: Secondary | ICD-10-CM | POA: Diagnosis not present

## 2017-11-18 DIAGNOSIS — I739 Peripheral vascular disease, unspecified: Secondary | ICD-10-CM | POA: Diagnosis not present

## 2017-11-18 DIAGNOSIS — L97921 Non-pressure chronic ulcer of unspecified part of left lower leg limited to breakdown of skin: Secondary | ICD-10-CM

## 2017-11-18 DIAGNOSIS — R600 Localized edema: Secondary | ICD-10-CM

## 2017-11-18 DIAGNOSIS — I639 Cerebral infarction, unspecified: Secondary | ICD-10-CM | POA: Diagnosis not present

## 2017-11-18 NOTE — Progress Notes (Addendum)
Subjective: [name] is a 77 y.o. female patient seen in office for follow up evaluation of ulcerations bilateral. Patient reports that her ulcerations on her feet have healed or scabbed over and that now she is concerned about a new wound on the lateral side of her left leg.  Patient has home nursing weekly, currently using offloading padding and has stopped powder to feet since wounds have healed.  Patient denies nausea/fever/vomiting/chills/night sweats/shortness of breath/chest pain.  Patient has no other pedal complaints at this time.  Patient Active Problem List   Diagnosis Date Noted  . 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 PAF (paroxysmal atrial fibrillation) (HCC) 06/27/2017  . Coronary artery disease 12/08/2010  . Dyspnea 12/08/2010  . HYPERTENSION, BENIGN 04/01/2010  . RHEUMATOID ARTHRITIS 04/01/2010  . ABNORMAL CV (STRESS) TEST 04/01/2010  . LEG PAIN, RIGHT 03/12/2010   Current Outpatient Medications on File Prior to Visit  Medication Sig Dispense Refill  . amLODipine (NORVASC) 2.5 MG tablet Take 1 tablet (2.5 mg total) by mouth daily. 90 tablet 3  . aspirin 81 MG chewable tablet Chew 81 mg by mouth daily.    . B-D ULTRAFINE III SHORT PEN 31G X 8 MM MISC     . Cholecalciferol (VITAMIN D3) 2000 units TABS Take 1 tablet by mouth daily.    . cloNIDine (CATAPRES) 0.1 MG tablet Take 1 tablet by mouth Three times a day.    . diazepam (VALIUM) 5 MG tablet Take 5 mg by mouth 3 (three) times daily.      Marland Kitchen FREESTYLE LITE test strip     . furosemide (LASIX) 40 MG tablet Take 40 mg by mouth daily.      Marland Kitchen gabapentin (NEURONTIN) 100 MG capsule Take 100 mg by mouth 2 (two) times daily.    . insulin NPH-insulin regular (NOVOLIN 70/30) (70-30) 100 UNIT/ML injection Inject 39 units into the skin each morning and 6 units into the skin each evening.    . lansoprazole (PREVACID) 30 MG capsule Take 30 mg by mouth daily.      Marland Kitchen leflunomide (ARAVA) 10 MG tablet Take 10 mg by mouth daily.     Marland Kitchen levothyroxine (SYNTHROID, LEVOTHROID) 75 MCG tablet Take 75 mcg by mouth daily.      Marland Kitchen lidocaine (LIDODERM) 5 % UNW AND APP 1 PA TO SKIN UP TO 16 H PER DAY  6  . metFORMIN (GLUCOPHAGE) 1000 MG tablet Take 500 mg by mouth 3 (three) times daily. Take 1/2 tab twice a day    . morphine (MS CONTIN) 15 MG 12 hr tablet Take by mouth.    . Multiple Vitamin (MULTIVITAMIN) tablet Take 1 tablet by mouth daily.    . mupirocin ointment (BACTROBAN) 2 % To right toe ulcer 30 g 0  . oxyCODONE (OXY IR/ROXICODONE) 5 MG immediate release tablet Take 5 mg by mouth 3 (three) times daily.     Marland Kitchen oxyCODONE (OXYCONTIN) 40 MG 12 hr tablet Take 40 mg by mouth every 8 (eight) hours.      . potassium chloride (KLOR-CON 10) 10 MEQ CR tablet Take 10 mEq by mouth 2 (two) times daily. When taking furosemide     . predniSONE (DELTASONE) 5 MG tablet Take 5 mg by mouth 2 (two) times daily.     . rosuvastatin (CRESTOR) 5 MG tablet Take 0.5 tablets (2.5 mg total) by mouth daily. 30 tablet 3  . spironolactone (ALDACTONE) 25 MG tablet Take 25 mg by mouth 2 (two) times daily.    Marland Kitchen  Vancomycin HCl (VANCOMYCIN 2.5 MG/ML + HEPARIN 2500 UNITS/ML) 2.5 mLs by Intracatheter route.     Current Facility-Administered Medications on File Prior to Visit  Medication Dose Route Frequency Provider Last Rate Last Dose  . apixaban (ELIQUIS) tablet 5 mg  5 mg Oral BID Micki Riley, MD       Allergies  Allergen Reactions  . Gold Anaphylaxis    Swelling of lips/tongue, throat Swelling of lips/tongue, throat  . Nsaids Other (See Comments)    Intolerance Intolerance  . Sulfa Antibiotics Nausea Only  . Sulfonamide Derivatives   . Latex Other (See Comments)    blisters  . Sulfamethoxazole Nausea Only    No results found for this or any previous visit (from the past 2160 hour(s)).  Objective: There were no vitals filed for this visit.  General: Patient is awake, alert, oriented x 3 and in no acute distress.  Dermatology: Skin is warm  and dry bilateral with continued healed ulcerations that have skin callus or scabbed over on the right foot and now healed on the left foot as well.  To the left lateral leg there is a partial thickness fiber granular ulcer that measures 1.5 x 1.5 cm with no surrounding erythema however there is diffuse edema to lower extremities bilateral left greater than right this is likely a venous stasis ulceration there is no malodor no active drainage no warmth.  No other acute signs of infection.    Vascular: Dorsalis Pedis pulse = 1/4 Bilateral,  Posterior Tibial pulse = 0/4 Bilateral,  Capillary Fill Time < 5 seconds. Chronic venous skin changes.  Neurologic: Protective sensation absent to the level of the ankles bilateral using  the 5.07/10g [employer].  Musculosketal: No Pain with palpation to ulcerated area on the left leg. No pain with compression to calves bilateral. Planus and digital deformity with rigid hallux extensus on right.   No results for input(s): GRAMSTAIN, LABORGA in the last 8760 hours.  Assessment and Plan:  Problem List Items Addressed This Visit    None    Visit Diagnoses    Leg ulcer, left, limited to breakdown of skin (HCC)    -  Primary   PVD (peripheral vascular disease) (HCC)       Edema of both legs       Diabetic polyneuropathy associated with type 2 diabetes mellitus (HCC)         -Examined patient and re-discussed the progression of the wound and treatment alternatives. -Cleansed ulceration at left lower leg and applied customized antibiotic powder with dry sterile dressing and Ace wraps orders for nursing to do the same. To both feet ulcerations that were previously there have scabbed over, applied offloading padding to these areas and nursing to continue to do the same -Continue with walker for stability in gait -Continue with postop shoes until next visit if foot wounds remain healed and scabbed over slowly transition patient back to diabetic  shoes -Advised patient to go to the ER or return to office if the wound worsens or if constitutional symptoms are present. -Patient to return to office as scheduled in 3-4 weeks for follow up care/wound evaluation or sooner if problems arise.  Asencion Islam, DPM

## 2017-11-18 NOTE — Telephone Encounter (Signed)
Faxed wound care orders faxed to [facility].

## 2017-11-18 NOTE — Telephone Encounter (Signed)
-----   Message from Asencion Islam, [place] sent at 11/18/2017 12:48 PM EDT ----- Regarding: Wound care orders Applied offloading padding to previous ulcerated areas at feet to left lower leg cleanse lateral leg wound with saline then apply customized antibiotic powder covered with dry dressing and Ace compression twice weekly

## 2017-11-18 NOTE — Telephone Encounter (Signed)
Left message informing pt I would be sending Dr. Wynema Birch 11/18/2017 12:48pm wound care orders to [facility]. Faxed orders to [facility].

## 2017-11-21 DIAGNOSIS — I69312 Visuospatial deficit and spatial neglect following cerebral infarction: Secondary | ICD-10-CM | POA: Diagnosis not present

## 2017-11-21 DIAGNOSIS — I69311 Memory deficit following cerebral infarction: Secondary | ICD-10-CM | POA: Diagnosis not present

## 2017-11-21 DIAGNOSIS — E11621 Type 2 diabetes mellitus with foot ulcer: Secondary | ICD-10-CM | POA: Diagnosis not present

## 2017-11-21 DIAGNOSIS — I10 Essential (primary) hypertension: Secondary | ICD-10-CM | POA: Diagnosis not present

## 2017-11-21 DIAGNOSIS — M069 Rheumatoid arthritis, unspecified: Secondary | ICD-10-CM | POA: Diagnosis not present

## 2017-11-21 DIAGNOSIS — L97422 Non-pressure chronic ulcer of left heel and midfoot with fat layer exposed: Secondary | ICD-10-CM | POA: Diagnosis not present

## 2017-11-25 DIAGNOSIS — I10 Essential (primary) hypertension: Secondary | ICD-10-CM | POA: Diagnosis not present

## 2017-11-25 DIAGNOSIS — I69311 Memory deficit following cerebral infarction: Secondary | ICD-10-CM | POA: Diagnosis not present

## 2017-11-25 DIAGNOSIS — I69312 Visuospatial deficit and spatial neglect following cerebral infarction: Secondary | ICD-10-CM | POA: Diagnosis not present

## 2017-11-25 DIAGNOSIS — E11621 Type 2 diabetes mellitus with foot ulcer: Secondary | ICD-10-CM | POA: Diagnosis not present

## 2017-11-25 DIAGNOSIS — L97422 Non-pressure chronic ulcer of left heel and midfoot with fat layer exposed: Secondary | ICD-10-CM | POA: Diagnosis not present

## 2017-11-25 DIAGNOSIS — M069 Rheumatoid arthritis, unspecified: Secondary | ICD-10-CM | POA: Diagnosis not present

## 2017-11-28 DIAGNOSIS — M069 Rheumatoid arthritis, unspecified: Secondary | ICD-10-CM | POA: Diagnosis not present

## 2017-11-28 DIAGNOSIS — I69312 Visuospatial deficit and spatial neglect following cerebral infarction: Secondary | ICD-10-CM | POA: Diagnosis not present

## 2017-11-28 DIAGNOSIS — E11621 Type 2 diabetes mellitus with foot ulcer: Secondary | ICD-10-CM | POA: Diagnosis not present

## 2017-11-28 DIAGNOSIS — I10 Essential (primary) hypertension: Secondary | ICD-10-CM | POA: Diagnosis not present

## 2017-11-28 DIAGNOSIS — L97422 Non-pressure chronic ulcer of left heel and midfoot with fat layer exposed: Secondary | ICD-10-CM | POA: Diagnosis not present

## 2017-11-28 DIAGNOSIS — I69311 Memory deficit following cerebral infarction: Secondary | ICD-10-CM | POA: Diagnosis not present

## 2017-12-01 DIAGNOSIS — M069 Rheumatoid arthritis, unspecified: Secondary | ICD-10-CM | POA: Diagnosis not present

## 2017-12-01 DIAGNOSIS — I69312 Visuospatial deficit and spatial neglect following cerebral infarction: Secondary | ICD-10-CM | POA: Diagnosis not present

## 2017-12-01 DIAGNOSIS — I10 Essential (primary) hypertension: Secondary | ICD-10-CM | POA: Diagnosis not present

## 2017-12-01 DIAGNOSIS — L97422 Non-pressure chronic ulcer of left heel and midfoot with fat layer exposed: Secondary | ICD-10-CM | POA: Diagnosis not present

## 2017-12-01 DIAGNOSIS — I69311 Memory deficit following cerebral infarction: Secondary | ICD-10-CM | POA: Diagnosis not present

## 2017-12-01 DIAGNOSIS — E11621 Type 2 diabetes mellitus with foot ulcer: Secondary | ICD-10-CM | POA: Diagnosis not present

## 2017-12-06 DIAGNOSIS — I69312 Visuospatial deficit and spatial neglect following cerebral infarction: Secondary | ICD-10-CM | POA: Diagnosis not present

## 2017-12-06 DIAGNOSIS — I10 Essential (primary) hypertension: Secondary | ICD-10-CM | POA: Diagnosis not present

## 2017-12-06 DIAGNOSIS — L97422 Non-pressure chronic ulcer of left heel and midfoot with fat layer exposed: Secondary | ICD-10-CM | POA: Diagnosis not present

## 2017-12-06 DIAGNOSIS — I69311 Memory deficit following cerebral infarction: Secondary | ICD-10-CM | POA: Diagnosis not present

## 2017-12-06 DIAGNOSIS — E11621 Type 2 diabetes mellitus with foot ulcer: Secondary | ICD-10-CM | POA: Diagnosis not present

## 2017-12-06 DIAGNOSIS — M069 Rheumatoid arthritis, unspecified: Secondary | ICD-10-CM | POA: Diagnosis not present

## 2017-12-07 DIAGNOSIS — S42211A Unspecified displaced fracture of surgical neck of right humerus, initial encounter for closed fracture: Secondary | ICD-10-CM | POA: Diagnosis not present

## 2017-12-07 DIAGNOSIS — S12690A Other displaced fracture of seventh cervical vertebra, initial encounter for closed fracture: Secondary | ICD-10-CM | POA: Diagnosis present

## 2017-12-07 DIAGNOSIS — R531 Weakness: Secondary | ICD-10-CM | POA: Diagnosis not present

## 2017-12-07 DIAGNOSIS — S42301A Unspecified fracture of shaft of humerus, right arm, initial encounter for closed fracture: Secondary | ICD-10-CM | POA: Diagnosis not present

## 2017-12-07 DIAGNOSIS — M25551 Pain in right hip: Secondary | ICD-10-CM | POA: Diagnosis not present

## 2017-12-07 DIAGNOSIS — D649 Anemia, unspecified: Secondary | ICD-10-CM | POA: Diagnosis present

## 2017-12-07 DIAGNOSIS — E039 Hypothyroidism, unspecified: Secondary | ICD-10-CM | POA: Diagnosis present

## 2017-12-07 DIAGNOSIS — F329 Major depressive disorder, single episode, unspecified: Secondary | ICD-10-CM | POA: Diagnosis not present

## 2017-12-07 DIAGNOSIS — M1990 Unspecified osteoarthritis, unspecified site: Secondary | ICD-10-CM | POA: Diagnosis not present

## 2017-12-07 DIAGNOSIS — S42201A Unspecified fracture of upper end of right humerus, initial encounter for closed fracture: Secondary | ICD-10-CM | POA: Diagnosis not present

## 2017-12-07 DIAGNOSIS — Z79891 Long term (current) use of opiate analgesic: Secondary | ICD-10-CM | POA: Diagnosis not present

## 2017-12-07 DIAGNOSIS — Z794 Long term (current) use of insulin: Secondary | ICD-10-CM | POA: Diagnosis not present

## 2017-12-07 DIAGNOSIS — L97229 Non-pressure chronic ulcer of left calf with unspecified severity: Secondary | ICD-10-CM | POA: Diagnosis present

## 2017-12-07 DIAGNOSIS — I272 Pulmonary hypertension, unspecified: Secondary | ICD-10-CM | POA: Diagnosis present

## 2017-12-07 DIAGNOSIS — S301XXD Contusion of abdominal wall, subsequent encounter: Secondary | ICD-10-CM | POA: Diagnosis not present

## 2017-12-07 DIAGNOSIS — S32401A Unspecified fracture of right acetabulum, initial encounter for closed fracture: Secondary | ICD-10-CM | POA: Diagnosis not present

## 2017-12-07 DIAGNOSIS — S32048A Other fracture of fourth lumbar vertebra, initial encounter for closed fracture: Secondary | ICD-10-CM | POA: Diagnosis present

## 2017-12-07 DIAGNOSIS — R2681 Unsteadiness on feet: Secondary | ICD-10-CM | POA: Diagnosis not present

## 2017-12-07 DIAGNOSIS — Z743 Need for continuous supervision: Secondary | ICD-10-CM | POA: Diagnosis not present

## 2017-12-07 DIAGNOSIS — S62601D Fracture of unspecified phalanx of left index finger, subsequent encounter for fracture with routine healing: Secondary | ICD-10-CM | POA: Diagnosis not present

## 2017-12-07 DIAGNOSIS — K219 Gastro-esophageal reflux disease without esophagitis: Secondary | ICD-10-CM | POA: Diagnosis present

## 2017-12-07 DIAGNOSIS — I129 Hypertensive chronic kidney disease with stage 1 through stage 4 chronic kidney disease, or unspecified chronic kidney disease: Secondary | ICD-10-CM | POA: Diagnosis present

## 2017-12-07 DIAGNOSIS — I509 Heart failure, unspecified: Secondary | ICD-10-CM | POA: Diagnosis not present

## 2017-12-07 DIAGNOSIS — S32048D Other fracture of fourth lumbar vertebra, subsequent encounter for fracture with routine healing: Secondary | ICD-10-CM | POA: Diagnosis not present

## 2017-12-07 DIAGNOSIS — E785 Hyperlipidemia, unspecified: Secondary | ICD-10-CM | POA: Diagnosis present

## 2017-12-07 DIAGNOSIS — S32000A Wedge compression fracture of unspecified lumbar vertebra, initial encounter for closed fracture: Secondary | ICD-10-CM | POA: Diagnosis not present

## 2017-12-07 DIAGNOSIS — G8929 Other chronic pain: Secondary | ICD-10-CM | POA: Diagnosis present

## 2017-12-07 DIAGNOSIS — S20319A Abrasion of unspecified front wall of thorax, initial encounter: Secondary | ICD-10-CM | POA: Diagnosis present

## 2017-12-07 DIAGNOSIS — M25562 Pain in left knee: Secondary | ICD-10-CM | POA: Diagnosis not present

## 2017-12-07 DIAGNOSIS — M069 Rheumatoid arthritis, unspecified: Secondary | ICD-10-CM | POA: Diagnosis not present

## 2017-12-07 DIAGNOSIS — M25561 Pain in right knee: Secondary | ICD-10-CM | POA: Diagnosis not present

## 2017-12-07 DIAGNOSIS — M8580 Other specified disorders of bone density and structure, unspecified site: Secondary | ICD-10-CM | POA: Diagnosis not present

## 2017-12-07 DIAGNOSIS — I2729 Other secondary pulmonary hypertension: Secondary | ICD-10-CM | POA: Diagnosis not present

## 2017-12-07 DIAGNOSIS — L97529 Non-pressure chronic ulcer of other part of left foot with unspecified severity: Secondary | ICD-10-CM | POA: Diagnosis not present

## 2017-12-07 DIAGNOSIS — M79641 Pain in right hand: Secondary | ICD-10-CM | POA: Diagnosis not present

## 2017-12-07 DIAGNOSIS — S15091A Other specified injury of right carotid artery, initial encounter: Secondary | ICD-10-CM | POA: Diagnosis present

## 2017-12-07 DIAGNOSIS — S62611A Displaced fracture of proximal phalanx of left index finger, initial encounter for closed fracture: Secondary | ICD-10-CM | POA: Diagnosis present

## 2017-12-07 DIAGNOSIS — S66391A Other injury of extensor muscle, fascia and tendon of left index finger at wrist and hand level, initial encounter: Secondary | ICD-10-CM | POA: Diagnosis not present

## 2017-12-07 DIAGNOSIS — S32028A Other fracture of second lumbar vertebra, initial encounter for closed fracture: Secondary | ICD-10-CM | POA: Diagnosis not present

## 2017-12-07 DIAGNOSIS — E1065 Type 1 diabetes mellitus with hyperglycemia: Secondary | ICD-10-CM | POA: Diagnosis present

## 2017-12-07 DIAGNOSIS — S12601A Unspecified nondisplaced fracture of seventh cervical vertebra, initial encounter for closed fracture: Secondary | ICD-10-CM | POA: Diagnosis not present

## 2017-12-07 DIAGNOSIS — S12600A Unspecified displaced fracture of seventh cervical vertebra, initial encounter for closed fracture: Secondary | ICD-10-CM | POA: Diagnosis not present

## 2017-12-07 DIAGNOSIS — L97519 Non-pressure chronic ulcer of other part of right foot with unspecified severity: Secondary | ICD-10-CM | POA: Diagnosis not present

## 2017-12-07 DIAGNOSIS — S12690D Other displaced fracture of seventh cervical vertebra, subsequent encounter for fracture with routine healing: Secondary | ICD-10-CM | POA: Diagnosis not present

## 2017-12-07 DIAGNOSIS — S12400A Unspecified displaced fracture of fifth cervical vertebra, initial encounter for closed fracture: Secondary | ICD-10-CM | POA: Diagnosis not present

## 2017-12-07 DIAGNOSIS — N189 Chronic kidney disease, unspecified: Secondary | ICD-10-CM | POA: Diagnosis not present

## 2017-12-07 DIAGNOSIS — Z8673 Personal history of transient ischemic attack (TIA), and cerebral infarction without residual deficits: Secondary | ICD-10-CM | POA: Diagnosis not present

## 2017-12-07 DIAGNOSIS — S32491A Other specified fracture of right acetabulum, initial encounter for closed fracture: Secondary | ICD-10-CM | POA: Diagnosis present

## 2017-12-07 DIAGNOSIS — R0689 Other abnormalities of breathing: Secondary | ICD-10-CM | POA: Diagnosis present

## 2017-12-07 DIAGNOSIS — S32401D Unspecified fracture of right acetabulum, subsequent encounter for fracture with routine healing: Secondary | ICD-10-CM | POA: Diagnosis not present

## 2017-12-07 DIAGNOSIS — S32040A Wedge compression fracture of fourth lumbar vertebra, initial encounter for closed fracture: Secondary | ICD-10-CM | POA: Diagnosis not present

## 2017-12-07 DIAGNOSIS — J449 Chronic obstructive pulmonary disease, unspecified: Secondary | ICD-10-CM | POA: Diagnosis present

## 2017-12-07 DIAGNOSIS — Z7982 Long term (current) use of aspirin: Secondary | ICD-10-CM | POA: Diagnosis not present

## 2017-12-07 DIAGNOSIS — E1122 Type 2 diabetes mellitus with diabetic chronic kidney disease: Secondary | ICD-10-CM | POA: Diagnosis not present

## 2017-12-07 DIAGNOSIS — S32028D Other fracture of second lumbar vertebra, subsequent encounter for fracture with routine healing: Secondary | ICD-10-CM | POA: Diagnosis not present

## 2017-12-07 DIAGNOSIS — R278 Other lack of coordination: Secondary | ICD-10-CM | POA: Diagnosis not present

## 2017-12-07 DIAGNOSIS — G8911 Acute pain due to trauma: Secondary | ICD-10-CM | POA: Diagnosis not present

## 2017-12-07 DIAGNOSIS — R58 Hemorrhage, not elsewhere classified: Secondary | ICD-10-CM | POA: Diagnosis not present

## 2017-12-07 DIAGNOSIS — Y92410 Unspecified street and highway as the place of occurrence of the external cause: Secondary | ICD-10-CM | POA: Diagnosis not present

## 2017-12-07 DIAGNOSIS — Z041 Encounter for examination and observation following transport accident: Secondary | ICD-10-CM | POA: Diagnosis not present

## 2017-12-07 DIAGNOSIS — Y998 Other external cause status: Secondary | ICD-10-CM | POA: Diagnosis not present

## 2017-12-07 DIAGNOSIS — R279 Unspecified lack of coordination: Secondary | ICD-10-CM | POA: Diagnosis not present

## 2017-12-07 DIAGNOSIS — S32020A Wedge compression fracture of second lumbar vertebra, initial encounter for closed fracture: Secondary | ICD-10-CM | POA: Diagnosis not present

## 2017-12-07 DIAGNOSIS — M0579 Rheumatoid arthritis with rheumatoid factor of multiple sites without organ or systems involvement: Secondary | ICD-10-CM | POA: Diagnosis present

## 2017-12-07 DIAGNOSIS — J90 Pleural effusion, not elsewhere classified: Secondary | ICD-10-CM | POA: Diagnosis present

## 2017-12-07 DIAGNOSIS — S6992XA Unspecified injury of left wrist, hand and finger(s), initial encounter: Secondary | ICD-10-CM | POA: Diagnosis not present

## 2017-12-07 DIAGNOSIS — M79642 Pain in left hand: Secondary | ICD-10-CM | POA: Diagnosis not present

## 2017-12-07 DIAGNOSIS — M6281 Muscle weakness (generalized): Secondary | ICD-10-CM | POA: Diagnosis not present

## 2017-12-07 DIAGNOSIS — R262 Difficulty in walking, not elsewhere classified: Secondary | ICD-10-CM | POA: Diagnosis not present

## 2017-12-07 DIAGNOSIS — J9811 Atelectasis: Secondary | ICD-10-CM | POA: Diagnosis not present

## 2017-12-07 DIAGNOSIS — I10 Essential (primary) hypertension: Secondary | ICD-10-CM | POA: Diagnosis not present

## 2017-12-07 DIAGNOSIS — M0589 Other rheumatoid arthritis with rheumatoid factor of multiple sites: Secondary | ICD-10-CM | POA: Diagnosis not present

## 2017-12-07 DIAGNOSIS — M549 Dorsalgia, unspecified: Secondary | ICD-10-CM | POA: Diagnosis not present

## 2017-12-07 DIAGNOSIS — N183 Chronic kidney disease, stage 3 (moderate): Secondary | ICD-10-CM | POA: Diagnosis present

## 2017-12-07 DIAGNOSIS — Z79899 Other long term (current) drug therapy: Secondary | ICD-10-CM | POA: Diagnosis not present

## 2017-12-07 DIAGNOSIS — E10622 Type 1 diabetes mellitus with other skin ulcer: Secondary | ICD-10-CM | POA: Diagnosis present

## 2017-12-07 DIAGNOSIS — E11621 Type 2 diabetes mellitus with foot ulcer: Secondary | ICD-10-CM | POA: Diagnosis not present

## 2017-12-07 DIAGNOSIS — M3500 Sicca syndrome, unspecified: Secondary | ICD-10-CM | POA: Diagnosis present

## 2017-12-07 DIAGNOSIS — G629 Polyneuropathy, unspecified: Secondary | ICD-10-CM | POA: Diagnosis not present

## 2017-12-07 DIAGNOSIS — S301XXA Contusion of abdominal wall, initial encounter: Secondary | ICD-10-CM | POA: Diagnosis present

## 2017-12-07 DIAGNOSIS — S52531A Colles' fracture of right radius, initial encounter for closed fracture: Secondary | ICD-10-CM | POA: Diagnosis not present

## 2017-12-08 DIAGNOSIS — S32049A Unspecified fracture of fourth lumbar vertebra, initial encounter for closed fracture: Secondary | ICD-10-CM

## 2017-12-08 DIAGNOSIS — S32401A Unspecified fracture of right acetabulum, initial encounter for closed fracture: Secondary | ICD-10-CM

## 2017-12-08 DIAGNOSIS — S12600A Unspecified displaced fracture of seventh cervical vertebra, initial encounter for closed fracture: Secondary | ICD-10-CM

## 2017-12-08 DIAGNOSIS — S301XXA Contusion of abdominal wall, initial encounter: Secondary | ICD-10-CM

## 2017-12-08 DIAGNOSIS — S42301A Unspecified fracture of shaft of humerus, right arm, initial encounter for closed fracture: Secondary | ICD-10-CM

## 2017-12-08 HISTORY — DX: Unspecified displaced fracture of seventh cervical vertebra, initial encounter for closed fracture: S12.600A

## 2017-12-08 HISTORY — DX: Unspecified fracture of fourth lumbar vertebra, initial encounter for closed fracture: S32.049A

## 2017-12-08 HISTORY — DX: Unspecified fracture of right acetabulum, initial encounter for closed fracture: S32.401A

## 2017-12-08 HISTORY — DX: Contusion of abdominal wall, initial encounter: S30.1XXA

## 2017-12-08 HISTORY — DX: Unspecified fracture of shaft of humerus, right arm, initial encounter for closed fracture: S42.301A

## 2017-12-13 DIAGNOSIS — S32040D Wedge compression fracture of fourth lumbar vertebra, subsequent encounter for fracture with routine healing: Secondary | ICD-10-CM | POA: Diagnosis not present

## 2017-12-13 DIAGNOSIS — S301XXA Contusion of abdominal wall, initial encounter: Secondary | ICD-10-CM | POA: Diagnosis not present

## 2017-12-13 DIAGNOSIS — S42211D Unspecified displaced fracture of surgical neck of right humerus, subsequent encounter for fracture with routine healing: Secondary | ICD-10-CM | POA: Diagnosis not present

## 2017-12-13 DIAGNOSIS — E1042 Type 1 diabetes mellitus with diabetic polyneuropathy: Secondary | ICD-10-CM | POA: Diagnosis not present

## 2017-12-13 DIAGNOSIS — R2681 Unsteadiness on feet: Secondary | ICD-10-CM | POA: Diagnosis not present

## 2017-12-13 DIAGNOSIS — E559 Vitamin D deficiency, unspecified: Secondary | ICD-10-CM | POA: Diagnosis not present

## 2017-12-13 DIAGNOSIS — S42301A Unspecified fracture of shaft of humerus, right arm, initial encounter for closed fracture: Secondary | ICD-10-CM | POA: Diagnosis not present

## 2017-12-13 DIAGNOSIS — Z9841 Cataract extraction status, right eye: Secondary | ICD-10-CM | POA: Diagnosis not present

## 2017-12-13 DIAGNOSIS — M6281 Muscle weakness (generalized): Secondary | ICD-10-CM | POA: Diagnosis not present

## 2017-12-13 DIAGNOSIS — Z7401 Bed confinement status: Secondary | ICD-10-CM | POA: Diagnosis not present

## 2017-12-13 DIAGNOSIS — R279 Unspecified lack of coordination: Secondary | ICD-10-CM | POA: Diagnosis not present

## 2017-12-13 DIAGNOSIS — R768 Other specified abnormal immunological findings in serum: Secondary | ICD-10-CM | POA: Diagnosis not present

## 2017-12-13 DIAGNOSIS — R262 Difficulty in walking, not elsewhere classified: Secondary | ICD-10-CM | POA: Diagnosis not present

## 2017-12-13 DIAGNOSIS — R928 Other abnormal and inconclusive findings on diagnostic imaging of breast: Secondary | ICD-10-CM | POA: Diagnosis not present

## 2017-12-13 DIAGNOSIS — Z79899 Other long term (current) drug therapy: Secondary | ICD-10-CM | POA: Diagnosis not present

## 2017-12-13 DIAGNOSIS — S12690D Other displaced fracture of seventh cervical vertebra, subsequent encounter for fracture with routine healing: Secondary | ICD-10-CM | POA: Diagnosis not present

## 2017-12-13 DIAGNOSIS — Z8673 Personal history of transient ischemic attack (TIA), and cerebral infarction without residual deficits: Secondary | ICD-10-CM | POA: Diagnosis not present

## 2017-12-13 DIAGNOSIS — M85811 Other specified disorders of bone density and structure, right shoulder: Secondary | ICD-10-CM | POA: Diagnosis not present

## 2017-12-13 DIAGNOSIS — S15001D Unspecified injury of right carotid artery, subsequent encounter: Secondary | ICD-10-CM | POA: Diagnosis not present

## 2017-12-13 DIAGNOSIS — M25511 Pain in right shoulder: Secondary | ICD-10-CM | POA: Diagnosis not present

## 2017-12-13 DIAGNOSIS — S12600A Unspecified displaced fracture of seventh cervical vertebra, initial encounter for closed fracture: Secondary | ICD-10-CM | POA: Diagnosis not present

## 2017-12-13 DIAGNOSIS — F329 Major depressive disorder, single episode, unspecified: Secondary | ICD-10-CM | POA: Diagnosis not present

## 2017-12-13 DIAGNOSIS — M4312 Spondylolisthesis, cervical region: Secondary | ICD-10-CM | POA: Diagnosis not present

## 2017-12-13 DIAGNOSIS — S32048D Other fracture of fourth lumbar vertebra, subsequent encounter for fracture with routine healing: Secondary | ICD-10-CM | POA: Diagnosis not present

## 2017-12-13 DIAGNOSIS — S12601A Unspecified nondisplaced fracture of seventh cervical vertebra, initial encounter for closed fracture: Secondary | ICD-10-CM | POA: Diagnosis not present

## 2017-12-13 DIAGNOSIS — M2550 Pain in unspecified joint: Secondary | ICD-10-CM | POA: Diagnosis not present

## 2017-12-13 DIAGNOSIS — R300 Dysuria: Secondary | ICD-10-CM | POA: Diagnosis not present

## 2017-12-13 DIAGNOSIS — M0589 Other rheumatoid arthritis with rheumatoid factor of multiple sites: Secondary | ICD-10-CM | POA: Diagnosis not present

## 2017-12-13 DIAGNOSIS — S32421D Displaced fracture of posterior wall of right acetabulum, subsequent encounter for fracture with routine healing: Secondary | ICD-10-CM | POA: Diagnosis not present

## 2017-12-13 DIAGNOSIS — K625 Hemorrhage of anus and rectum: Secondary | ICD-10-CM | POA: Diagnosis not present

## 2017-12-13 DIAGNOSIS — S32020D Wedge compression fracture of second lumbar vertebra, subsequent encounter for fracture with routine healing: Secondary | ICD-10-CM | POA: Diagnosis not present

## 2017-12-13 DIAGNOSIS — Q758 Other specified congenital malformations of skull and face bones: Secondary | ICD-10-CM | POA: Diagnosis not present

## 2017-12-13 DIAGNOSIS — F419 Anxiety disorder, unspecified: Secondary | ICD-10-CM | POA: Diagnosis not present

## 2017-12-13 DIAGNOSIS — R52 Pain, unspecified: Secondary | ICD-10-CM | POA: Diagnosis not present

## 2017-12-13 DIAGNOSIS — M0689 Other specified rheumatoid arthritis, multiple sites: Secondary | ICD-10-CM | POA: Diagnosis not present

## 2017-12-13 DIAGNOSIS — H16223 Keratoconjunctivitis sicca, not specified as Sjogren's, bilateral: Secondary | ICD-10-CM | POA: Diagnosis not present

## 2017-12-13 DIAGNOSIS — S32020A Wedge compression fracture of second lumbar vertebra, initial encounter for closed fracture: Secondary | ICD-10-CM | POA: Diagnosis not present

## 2017-12-13 DIAGNOSIS — S32401D Unspecified fracture of right acetabulum, subsequent encounter for fracture with routine healing: Secondary | ICD-10-CM | POA: Diagnosis not present

## 2017-12-13 DIAGNOSIS — S32401A Unspecified fracture of right acetabulum, initial encounter for closed fracture: Secondary | ICD-10-CM | POA: Diagnosis not present

## 2017-12-13 DIAGNOSIS — Z4789 Encounter for other orthopedic aftercare: Secondary | ICD-10-CM | POA: Diagnosis not present

## 2017-12-13 DIAGNOSIS — M8588 Other specified disorders of bone density and structure, other site: Secondary | ICD-10-CM | POA: Diagnosis not present

## 2017-12-13 DIAGNOSIS — Z961 Presence of intraocular lens: Secondary | ICD-10-CM | POA: Diagnosis not present

## 2017-12-13 DIAGNOSIS — A429 Actinomycosis, unspecified: Secondary | ICD-10-CM | POA: Diagnosis not present

## 2017-12-13 DIAGNOSIS — Z87828 Personal history of other (healed) physical injury and trauma: Secondary | ICD-10-CM | POA: Diagnosis not present

## 2017-12-13 DIAGNOSIS — Z7982 Long term (current) use of aspirin: Secondary | ICD-10-CM | POA: Diagnosis not present

## 2017-12-13 DIAGNOSIS — M0579 Rheumatoid arthritis with rheumatoid factor of multiple sites without organ or systems involvement: Secondary | ICD-10-CM | POA: Diagnosis not present

## 2017-12-13 DIAGNOSIS — M158 Other polyosteoarthritis: Secondary | ICD-10-CM | POA: Diagnosis not present

## 2017-12-13 DIAGNOSIS — S12601D Unspecified nondisplaced fracture of seventh cervical vertebra, subsequent encounter for fracture with routine healing: Secondary | ICD-10-CM | POA: Diagnosis not present

## 2017-12-13 DIAGNOSIS — R5381 Other malaise: Secondary | ICD-10-CM | POA: Diagnosis not present

## 2017-12-13 DIAGNOSIS — M62838 Other muscle spasm: Secondary | ICD-10-CM | POA: Diagnosis not present

## 2017-12-13 DIAGNOSIS — Z743 Need for continuous supervision: Secondary | ICD-10-CM | POA: Diagnosis not present

## 2017-12-13 DIAGNOSIS — S32049A Unspecified fracture of fourth lumbar vertebra, initial encounter for closed fracture: Secondary | ICD-10-CM | POA: Diagnosis not present

## 2017-12-13 DIAGNOSIS — I272 Pulmonary hypertension, unspecified: Secondary | ICD-10-CM | POA: Diagnosis not present

## 2017-12-13 DIAGNOSIS — S3991XA Unspecified injury of abdomen, initial encounter: Secondary | ICD-10-CM | POA: Diagnosis not present

## 2017-12-13 DIAGNOSIS — D649 Anemia, unspecified: Secondary | ICD-10-CM | POA: Diagnosis not present

## 2017-12-13 DIAGNOSIS — M8580 Other specified disorders of bone density and structure, unspecified site: Secondary | ICD-10-CM | POA: Diagnosis not present

## 2017-12-13 DIAGNOSIS — I959 Hypotension, unspecified: Secondary | ICD-10-CM | POA: Diagnosis not present

## 2017-12-13 DIAGNOSIS — R509 Fever, unspecified: Secondary | ICD-10-CM | POA: Diagnosis not present

## 2017-12-13 DIAGNOSIS — R278 Other lack of coordination: Secondary | ICD-10-CM | POA: Diagnosis not present

## 2017-12-13 DIAGNOSIS — G8929 Other chronic pain: Secondary | ICD-10-CM | POA: Diagnosis not present

## 2017-12-13 DIAGNOSIS — J910 Malignant pleural effusion: Secondary | ICD-10-CM | POA: Diagnosis not present

## 2017-12-13 DIAGNOSIS — G8911 Acute pain due to trauma: Secondary | ICD-10-CM | POA: Diagnosis not present

## 2017-12-13 DIAGNOSIS — M4802 Spinal stenosis, cervical region: Secondary | ICD-10-CM | POA: Diagnosis not present

## 2017-12-13 DIAGNOSIS — M19011 Primary osteoarthritis, right shoulder: Secondary | ICD-10-CM | POA: Diagnosis not present

## 2017-12-13 DIAGNOSIS — R911 Solitary pulmonary nodule: Secondary | ICD-10-CM | POA: Diagnosis not present

## 2017-12-13 DIAGNOSIS — N183 Chronic kidney disease, stage 3 (moderate): Secondary | ICD-10-CM | POA: Diagnosis not present

## 2017-12-13 DIAGNOSIS — S32029A Unspecified fracture of second lumbar vertebra, initial encounter for closed fracture: Secondary | ICD-10-CM | POA: Diagnosis not present

## 2017-12-13 DIAGNOSIS — T07XXXA Unspecified multiple injuries, initial encounter: Secondary | ICD-10-CM | POA: Diagnosis not present

## 2017-12-13 DIAGNOSIS — R404 Transient alteration of awareness: Secondary | ICD-10-CM | POA: Diagnosis not present

## 2017-12-13 DIAGNOSIS — M25551 Pain in right hip: Secondary | ICD-10-CM | POA: Diagnosis not present

## 2017-12-13 DIAGNOSIS — R531 Weakness: Secondary | ICD-10-CM | POA: Diagnosis not present

## 2017-12-13 DIAGNOSIS — S42201D Unspecified fracture of upper end of right humerus, subsequent encounter for fracture with routine healing: Secondary | ICD-10-CM | POA: Diagnosis not present

## 2017-12-13 DIAGNOSIS — J90 Pleural effusion, not elsewhere classified: Secondary | ICD-10-CM | POA: Diagnosis not present

## 2017-12-13 DIAGNOSIS — S62601D Fracture of unspecified phalanx of left index finger, subsequent encounter for fracture with routine healing: Secondary | ICD-10-CM | POA: Diagnosis not present

## 2017-12-13 DIAGNOSIS — S301XXD Contusion of abdominal wall, subsequent encounter: Secondary | ICD-10-CM | POA: Diagnosis not present

## 2017-12-13 DIAGNOSIS — S299XXA Unspecified injury of thorax, initial encounter: Secondary | ICD-10-CM | POA: Diagnosis not present

## 2017-12-13 DIAGNOSIS — S32028D Other fracture of second lumbar vertebra, subsequent encounter for fracture with routine healing: Secondary | ICD-10-CM | POA: Diagnosis not present

## 2017-12-16 ENCOUNTER — Ambulatory Visit: Payer: Medicare Other | Admitting: Sports Medicine

## 2017-12-16 DIAGNOSIS — S299XXA Unspecified injury of thorax, initial encounter: Secondary | ICD-10-CM | POA: Diagnosis not present

## 2017-12-16 DIAGNOSIS — J910 Malignant pleural effusion: Secondary | ICD-10-CM | POA: Diagnosis not present

## 2017-12-16 DIAGNOSIS — D649 Anemia, unspecified: Secondary | ICD-10-CM | POA: Diagnosis not present

## 2017-12-16 DIAGNOSIS — S32029A Unspecified fracture of second lumbar vertebra, initial encounter for closed fracture: Secondary | ICD-10-CM | POA: Diagnosis not present

## 2017-12-16 DIAGNOSIS — R531 Weakness: Secondary | ICD-10-CM | POA: Diagnosis not present

## 2017-12-16 DIAGNOSIS — Z87828 Personal history of other (healed) physical injury and trauma: Secondary | ICD-10-CM | POA: Diagnosis not present

## 2017-12-16 DIAGNOSIS — S3991XA Unspecified injury of abdomen, initial encounter: Secondary | ICD-10-CM | POA: Diagnosis not present

## 2017-12-18 DIAGNOSIS — K625 Hemorrhage of anus and rectum: Secondary | ICD-10-CM | POA: Diagnosis not present

## 2017-12-21 DIAGNOSIS — S15001D Unspecified injury of right carotid artery, subsequent encounter: Secondary | ICD-10-CM | POA: Diagnosis not present

## 2017-12-21 DIAGNOSIS — S12601D Unspecified nondisplaced fracture of seventh cervical vertebra, subsequent encounter for fracture with routine healing: Secondary | ICD-10-CM | POA: Diagnosis not present

## 2017-12-21 DIAGNOSIS — S12601A Unspecified nondisplaced fracture of seventh cervical vertebra, initial encounter for closed fracture: Secondary | ICD-10-CM | POA: Diagnosis not present

## 2017-12-21 DIAGNOSIS — S32401D Unspecified fracture of right acetabulum, subsequent encounter for fracture with routine healing: Secondary | ICD-10-CM | POA: Diagnosis not present

## 2017-12-21 DIAGNOSIS — S42201D Unspecified fracture of upper end of right humerus, subsequent encounter for fracture with routine healing: Secondary | ICD-10-CM | POA: Diagnosis not present

## 2017-12-21 DIAGNOSIS — S32040D Wedge compression fracture of fourth lumbar vertebra, subsequent encounter for fracture with routine healing: Secondary | ICD-10-CM | POA: Diagnosis not present

## 2017-12-21 DIAGNOSIS — S12690D Other displaced fracture of seventh cervical vertebra, subsequent encounter for fracture with routine healing: Secondary | ICD-10-CM | POA: Diagnosis not present

## 2017-12-21 DIAGNOSIS — S32020D Wedge compression fracture of second lumbar vertebra, subsequent encounter for fracture with routine healing: Secondary | ICD-10-CM | POA: Diagnosis not present

## 2017-12-21 DIAGNOSIS — R300 Dysuria: Secondary | ICD-10-CM | POA: Diagnosis not present

## 2017-12-27 DIAGNOSIS — S32049A Unspecified fracture of fourth lumbar vertebra, initial encounter for closed fracture: Secondary | ICD-10-CM | POA: Diagnosis not present

## 2017-12-27 DIAGNOSIS — S32029A Unspecified fracture of second lumbar vertebra, initial encounter for closed fracture: Secondary | ICD-10-CM | POA: Diagnosis not present

## 2017-12-27 DIAGNOSIS — M62838 Other muscle spasm: Secondary | ICD-10-CM | POA: Diagnosis not present

## 2017-12-28 DIAGNOSIS — S32421D Displaced fracture of posterior wall of right acetabulum, subsequent encounter for fracture with routine healing: Secondary | ICD-10-CM | POA: Diagnosis not present

## 2017-12-28 DIAGNOSIS — M85811 Other specified disorders of bone density and structure, right shoulder: Secondary | ICD-10-CM | POA: Diagnosis not present

## 2017-12-28 DIAGNOSIS — S42201D Unspecified fracture of upper end of right humerus, subsequent encounter for fracture with routine healing: Secondary | ICD-10-CM | POA: Diagnosis not present

## 2017-12-28 DIAGNOSIS — S12690D Other displaced fracture of seventh cervical vertebra, subsequent encounter for fracture with routine healing: Secondary | ICD-10-CM | POA: Diagnosis not present

## 2017-12-28 DIAGNOSIS — S42211D Unspecified displaced fracture of surgical neck of right humerus, subsequent encounter for fracture with routine healing: Secondary | ICD-10-CM | POA: Diagnosis not present

## 2017-12-28 DIAGNOSIS — S32401D Unspecified fracture of right acetabulum, subsequent encounter for fracture with routine healing: Secondary | ICD-10-CM | POA: Diagnosis not present

## 2017-12-28 DIAGNOSIS — M8588 Other specified disorders of bone density and structure, other site: Secondary | ICD-10-CM | POA: Diagnosis not present

## 2017-12-28 DIAGNOSIS — G8929 Other chronic pain: Secondary | ICD-10-CM | POA: Diagnosis not present

## 2018-01-24 DIAGNOSIS — M19011 Primary osteoarthritis, right shoulder: Secondary | ICD-10-CM | POA: Diagnosis not present

## 2018-01-24 DIAGNOSIS — S32401D Unspecified fracture of right acetabulum, subsequent encounter for fracture with routine healing: Secondary | ICD-10-CM | POA: Diagnosis not present

## 2018-01-24 DIAGNOSIS — S42211D Unspecified displaced fracture of surgical neck of right humerus, subsequent encounter for fracture with routine healing: Secondary | ICD-10-CM | POA: Diagnosis not present

## 2018-01-24 DIAGNOSIS — Z4789 Encounter for other orthopedic aftercare: Secondary | ICD-10-CM | POA: Diagnosis not present

## 2018-02-08 DIAGNOSIS — G8929 Other chronic pain: Secondary | ICD-10-CM | POA: Diagnosis not present

## 2018-02-08 DIAGNOSIS — M8580 Other specified disorders of bone density and structure, unspecified site: Secondary | ICD-10-CM | POA: Diagnosis not present

## 2018-02-08 DIAGNOSIS — S12690D Other displaced fracture of seventh cervical vertebra, subsequent encounter for fracture with routine healing: Secondary | ICD-10-CM | POA: Diagnosis not present

## 2018-02-08 DIAGNOSIS — M25511 Pain in right shoulder: Secondary | ICD-10-CM | POA: Diagnosis not present

## 2018-02-08 DIAGNOSIS — M0689 Other specified rheumatoid arthritis, multiple sites: Secondary | ICD-10-CM | POA: Diagnosis not present

## 2018-02-27 DIAGNOSIS — M79652 Pain in left thigh: Secondary | ICD-10-CM | POA: Diagnosis not present

## 2018-02-28 DIAGNOSIS — R7611 Nonspecific reaction to tuberculin skin test without active tuberculosis: Secondary | ICD-10-CM | POA: Diagnosis not present

## 2018-02-28 DIAGNOSIS — Z23 Encounter for immunization: Secondary | ICD-10-CM | POA: Diagnosis not present

## 2018-02-28 DIAGNOSIS — H04123 Dry eye syndrome of bilateral lacrimal glands: Secondary | ICD-10-CM | POA: Diagnosis not present

## 2018-02-28 DIAGNOSIS — M069 Rheumatoid arthritis, unspecified: Secondary | ICD-10-CM | POA: Diagnosis not present

## 2018-02-28 DIAGNOSIS — G8929 Other chronic pain: Secondary | ICD-10-CM | POA: Diagnosis not present

## 2018-02-28 DIAGNOSIS — L304 Erythema intertrigo: Secondary | ICD-10-CM | POA: Diagnosis not present

## 2018-03-03 DIAGNOSIS — Z794 Long term (current) use of insulin: Secondary | ICD-10-CM | POA: Diagnosis not present

## 2018-03-03 DIAGNOSIS — G8929 Other chronic pain: Secondary | ICD-10-CM | POA: Diagnosis not present

## 2018-03-03 DIAGNOSIS — S32048D Other fracture of fourth lumbar vertebra, subsequent encounter for fracture with routine healing: Secondary | ICD-10-CM | POA: Diagnosis not present

## 2018-03-03 DIAGNOSIS — N183 Chronic kidney disease, stage 3 (moderate): Secondary | ICD-10-CM | POA: Diagnosis not present

## 2018-03-03 DIAGNOSIS — Z79891 Long term (current) use of opiate analgesic: Secondary | ICD-10-CM | POA: Diagnosis not present

## 2018-03-03 DIAGNOSIS — S32401D Unspecified fracture of right acetabulum, subsequent encounter for fracture with routine healing: Secondary | ICD-10-CM | POA: Diagnosis not present

## 2018-03-03 DIAGNOSIS — Z602 Problems related to living alone: Secondary | ICD-10-CM | POA: Diagnosis not present

## 2018-03-03 DIAGNOSIS — R911 Solitary pulmonary nodule: Secondary | ICD-10-CM | POA: Diagnosis not present

## 2018-03-03 DIAGNOSIS — E039 Hypothyroidism, unspecified: Secondary | ICD-10-CM | POA: Diagnosis not present

## 2018-03-03 DIAGNOSIS — L89152 Pressure ulcer of sacral region, stage 2: Secondary | ICD-10-CM | POA: Diagnosis not present

## 2018-03-03 DIAGNOSIS — Z86718 Personal history of other venous thrombosis and embolism: Secondary | ICD-10-CM | POA: Diagnosis not present

## 2018-03-03 DIAGNOSIS — S32028D Other fracture of second lumbar vertebra, subsequent encounter for fracture with routine healing: Secondary | ICD-10-CM | POA: Diagnosis not present

## 2018-03-03 DIAGNOSIS — Z7952 Long term (current) use of systemic steroids: Secondary | ICD-10-CM | POA: Diagnosis not present

## 2018-03-03 DIAGNOSIS — I129 Hypertensive chronic kidney disease with stage 1 through stage 4 chronic kidney disease, or unspecified chronic kidney disease: Secondary | ICD-10-CM | POA: Diagnosis not present

## 2018-03-03 DIAGNOSIS — M0609 Rheumatoid arthritis without rheumatoid factor, multiple sites: Secondary | ICD-10-CM | POA: Diagnosis not present

## 2018-03-03 DIAGNOSIS — S42301D Unspecified fracture of shaft of humerus, right arm, subsequent encounter for fracture with routine healing: Secondary | ICD-10-CM | POA: Diagnosis not present

## 2018-03-03 DIAGNOSIS — S12600D Unspecified displaced fracture of seventh cervical vertebra, subsequent encounter for fracture with routine healing: Secondary | ICD-10-CM | POA: Diagnosis not present

## 2018-03-03 DIAGNOSIS — Z7982 Long term (current) use of aspirin: Secondary | ICD-10-CM | POA: Diagnosis not present

## 2018-03-03 DIAGNOSIS — S51812D Laceration without foreign body of left forearm, subsequent encounter: Secondary | ICD-10-CM | POA: Diagnosis not present

## 2018-03-03 DIAGNOSIS — E1142 Type 2 diabetes mellitus with diabetic polyneuropathy: Secondary | ICD-10-CM | POA: Diagnosis not present

## 2018-03-03 DIAGNOSIS — E1122 Type 2 diabetes mellitus with diabetic chronic kidney disease: Secondary | ICD-10-CM | POA: Diagnosis not present

## 2018-03-07 DIAGNOSIS — S32048D Other fracture of fourth lumbar vertebra, subsequent encounter for fracture with routine healing: Secondary | ICD-10-CM | POA: Diagnosis not present

## 2018-03-07 DIAGNOSIS — S42301D Unspecified fracture of shaft of humerus, right arm, subsequent encounter for fracture with routine healing: Secondary | ICD-10-CM | POA: Diagnosis not present

## 2018-03-07 DIAGNOSIS — S32028D Other fracture of second lumbar vertebra, subsequent encounter for fracture with routine healing: Secondary | ICD-10-CM | POA: Diagnosis not present

## 2018-03-07 DIAGNOSIS — S12600D Unspecified displaced fracture of seventh cervical vertebra, subsequent encounter for fracture with routine healing: Secondary | ICD-10-CM | POA: Diagnosis not present

## 2018-03-07 DIAGNOSIS — S32401D Unspecified fracture of right acetabulum, subsequent encounter for fracture with routine healing: Secondary | ICD-10-CM | POA: Diagnosis not present

## 2018-03-07 DIAGNOSIS — L89152 Pressure ulcer of sacral region, stage 2: Secondary | ICD-10-CM | POA: Diagnosis not present

## 2018-03-08 DIAGNOSIS — S32401D Unspecified fracture of right acetabulum, subsequent encounter for fracture with routine healing: Secondary | ICD-10-CM | POA: Diagnosis not present

## 2018-03-08 DIAGNOSIS — S12600D Unspecified displaced fracture of seventh cervical vertebra, subsequent encounter for fracture with routine healing: Secondary | ICD-10-CM | POA: Diagnosis not present

## 2018-03-08 DIAGNOSIS — S42301D Unspecified fracture of shaft of humerus, right arm, subsequent encounter for fracture with routine healing: Secondary | ICD-10-CM | POA: Diagnosis not present

## 2018-03-08 DIAGNOSIS — S32048D Other fracture of fourth lumbar vertebra, subsequent encounter for fracture with routine healing: Secondary | ICD-10-CM | POA: Diagnosis not present

## 2018-03-08 DIAGNOSIS — L89152 Pressure ulcer of sacral region, stage 2: Secondary | ICD-10-CM | POA: Diagnosis not present

## 2018-03-08 DIAGNOSIS — S32028D Other fracture of second lumbar vertebra, subsequent encounter for fracture with routine healing: Secondary | ICD-10-CM | POA: Diagnosis not present

## 2018-03-09 ENCOUNTER — Ambulatory Visit: Payer: Medicare Other | Admitting: Sports Medicine

## 2018-03-10 DIAGNOSIS — S32401D Unspecified fracture of right acetabulum, subsequent encounter for fracture with routine healing: Secondary | ICD-10-CM | POA: Diagnosis not present

## 2018-03-10 DIAGNOSIS — S12600D Unspecified displaced fracture of seventh cervical vertebra, subsequent encounter for fracture with routine healing: Secondary | ICD-10-CM | POA: Diagnosis not present

## 2018-03-10 DIAGNOSIS — S32028D Other fracture of second lumbar vertebra, subsequent encounter for fracture with routine healing: Secondary | ICD-10-CM | POA: Diagnosis not present

## 2018-03-10 DIAGNOSIS — S42301D Unspecified fracture of shaft of humerus, right arm, subsequent encounter for fracture with routine healing: Secondary | ICD-10-CM | POA: Diagnosis not present

## 2018-03-10 DIAGNOSIS — L89152 Pressure ulcer of sacral region, stage 2: Secondary | ICD-10-CM | POA: Diagnosis not present

## 2018-03-10 DIAGNOSIS — Z01818 Encounter for other preprocedural examination: Secondary | ICD-10-CM | POA: Diagnosis not present

## 2018-03-10 DIAGNOSIS — S32048D Other fracture of fourth lumbar vertebra, subsequent encounter for fracture with routine healing: Secondary | ICD-10-CM | POA: Diagnosis not present

## 2018-03-10 DIAGNOSIS — E119 Type 2 diabetes mellitus without complications: Secondary | ICD-10-CM | POA: Diagnosis not present

## 2018-03-10 DIAGNOSIS — H25812 Combined forms of age-related cataract, left eye: Secondary | ICD-10-CM | POA: Diagnosis not present

## 2018-03-10 DIAGNOSIS — H269 Unspecified cataract: Secondary | ICD-10-CM | POA: Diagnosis not present

## 2018-03-13 DIAGNOSIS — S32028D Other fracture of second lumbar vertebra, subsequent encounter for fracture with routine healing: Secondary | ICD-10-CM | POA: Diagnosis not present

## 2018-03-13 DIAGNOSIS — S32401D Unspecified fracture of right acetabulum, subsequent encounter for fracture with routine healing: Secondary | ICD-10-CM | POA: Diagnosis not present

## 2018-03-13 DIAGNOSIS — S32048D Other fracture of fourth lumbar vertebra, subsequent encounter for fracture with routine healing: Secondary | ICD-10-CM | POA: Diagnosis not present

## 2018-03-13 DIAGNOSIS — S12600D Unspecified displaced fracture of seventh cervical vertebra, subsequent encounter for fracture with routine healing: Secondary | ICD-10-CM | POA: Diagnosis not present

## 2018-03-13 DIAGNOSIS — S42301D Unspecified fracture of shaft of humerus, right arm, subsequent encounter for fracture with routine healing: Secondary | ICD-10-CM | POA: Diagnosis not present

## 2018-03-13 DIAGNOSIS — L89152 Pressure ulcer of sacral region, stage 2: Secondary | ICD-10-CM | POA: Diagnosis not present

## 2018-03-14 DIAGNOSIS — S32401D Unspecified fracture of right acetabulum, subsequent encounter for fracture with routine healing: Secondary | ICD-10-CM | POA: Diagnosis not present

## 2018-03-14 DIAGNOSIS — L89152 Pressure ulcer of sacral region, stage 2: Secondary | ICD-10-CM | POA: Diagnosis not present

## 2018-03-14 DIAGNOSIS — S32048D Other fracture of fourth lumbar vertebra, subsequent encounter for fracture with routine healing: Secondary | ICD-10-CM | POA: Diagnosis not present

## 2018-03-14 DIAGNOSIS — S42301D Unspecified fracture of shaft of humerus, right arm, subsequent encounter for fracture with routine healing: Secondary | ICD-10-CM | POA: Diagnosis not present

## 2018-03-14 DIAGNOSIS — S12600D Unspecified displaced fracture of seventh cervical vertebra, subsequent encounter for fracture with routine healing: Secondary | ICD-10-CM | POA: Diagnosis not present

## 2018-03-14 DIAGNOSIS — S32028D Other fracture of second lumbar vertebra, subsequent encounter for fracture with routine healing: Secondary | ICD-10-CM | POA: Diagnosis not present

## 2018-03-15 ENCOUNTER — Ambulatory Visit: Payer: Medicare Other | Admitting: Sports Medicine

## 2018-03-15 DIAGNOSIS — H2512 Age-related nuclear cataract, left eye: Secondary | ICD-10-CM | POA: Diagnosis not present

## 2018-03-15 DIAGNOSIS — H25812 Combined forms of age-related cataract, left eye: Secondary | ICD-10-CM | POA: Diagnosis not present

## 2018-03-17 DIAGNOSIS — S42301D Unspecified fracture of shaft of humerus, right arm, subsequent encounter for fracture with routine healing: Secondary | ICD-10-CM | POA: Diagnosis not present

## 2018-03-17 DIAGNOSIS — S32028D Other fracture of second lumbar vertebra, subsequent encounter for fracture with routine healing: Secondary | ICD-10-CM | POA: Diagnosis not present

## 2018-03-17 DIAGNOSIS — S32048D Other fracture of fourth lumbar vertebra, subsequent encounter for fracture with routine healing: Secondary | ICD-10-CM | POA: Diagnosis not present

## 2018-03-17 DIAGNOSIS — L89152 Pressure ulcer of sacral region, stage 2: Secondary | ICD-10-CM | POA: Diagnosis not present

## 2018-03-17 DIAGNOSIS — S12600D Unspecified displaced fracture of seventh cervical vertebra, subsequent encounter for fracture with routine healing: Secondary | ICD-10-CM | POA: Diagnosis not present

## 2018-03-17 DIAGNOSIS — S32401D Unspecified fracture of right acetabulum, subsequent encounter for fracture with routine healing: Secondary | ICD-10-CM | POA: Diagnosis not present

## 2018-03-20 DIAGNOSIS — S32048D Other fracture of fourth lumbar vertebra, subsequent encounter for fracture with routine healing: Secondary | ICD-10-CM | POA: Diagnosis not present

## 2018-03-20 DIAGNOSIS — L89152 Pressure ulcer of sacral region, stage 2: Secondary | ICD-10-CM | POA: Diagnosis not present

## 2018-03-20 DIAGNOSIS — S32401D Unspecified fracture of right acetabulum, subsequent encounter for fracture with routine healing: Secondary | ICD-10-CM | POA: Diagnosis not present

## 2018-03-20 DIAGNOSIS — S42301D Unspecified fracture of shaft of humerus, right arm, subsequent encounter for fracture with routine healing: Secondary | ICD-10-CM | POA: Diagnosis not present

## 2018-03-20 DIAGNOSIS — S12600D Unspecified displaced fracture of seventh cervical vertebra, subsequent encounter for fracture with routine healing: Secondary | ICD-10-CM | POA: Diagnosis not present

## 2018-03-20 DIAGNOSIS — S32028D Other fracture of second lumbar vertebra, subsequent encounter for fracture with routine healing: Secondary | ICD-10-CM | POA: Diagnosis not present

## 2018-03-21 DIAGNOSIS — S12600D Unspecified displaced fracture of seventh cervical vertebra, subsequent encounter for fracture with routine healing: Secondary | ICD-10-CM | POA: Diagnosis not present

## 2018-03-21 DIAGNOSIS — S32048D Other fracture of fourth lumbar vertebra, subsequent encounter for fracture with routine healing: Secondary | ICD-10-CM | POA: Diagnosis not present

## 2018-03-21 DIAGNOSIS — L89152 Pressure ulcer of sacral region, stage 2: Secondary | ICD-10-CM | POA: Diagnosis not present

## 2018-03-21 DIAGNOSIS — S32401D Unspecified fracture of right acetabulum, subsequent encounter for fracture with routine healing: Secondary | ICD-10-CM | POA: Diagnosis not present

## 2018-03-21 DIAGNOSIS — S32028D Other fracture of second lumbar vertebra, subsequent encounter for fracture with routine healing: Secondary | ICD-10-CM | POA: Diagnosis not present

## 2018-03-21 DIAGNOSIS — S42301D Unspecified fracture of shaft of humerus, right arm, subsequent encounter for fracture with routine healing: Secondary | ICD-10-CM | POA: Diagnosis not present

## 2018-03-22 DIAGNOSIS — E1122 Type 2 diabetes mellitus with diabetic chronic kidney disease: Secondary | ICD-10-CM | POA: Diagnosis not present

## 2018-03-22 DIAGNOSIS — E1165 Type 2 diabetes mellitus with hyperglycemia: Secondary | ICD-10-CM | POA: Diagnosis not present

## 2018-03-22 DIAGNOSIS — R51 Headache: Secondary | ICD-10-CM | POA: Diagnosis not present

## 2018-03-22 DIAGNOSIS — D72829 Elevated white blood cell count, unspecified: Secondary | ICD-10-CM | POA: Diagnosis not present

## 2018-03-22 DIAGNOSIS — S15001S Unspecified injury of right carotid artery, sequela: Secondary | ICD-10-CM | POA: Diagnosis not present

## 2018-03-22 DIAGNOSIS — I129 Hypertensive chronic kidney disease with stage 1 through stage 4 chronic kidney disease, or unspecified chronic kidney disease: Secondary | ICD-10-CM | POA: Diagnosis not present

## 2018-03-22 DIAGNOSIS — I10 Essential (primary) hypertension: Secondary | ICD-10-CM | POA: Diagnosis not present

## 2018-03-22 DIAGNOSIS — M069 Rheumatoid arthritis, unspecified: Secondary | ICD-10-CM | POA: Diagnosis not present

## 2018-03-22 DIAGNOSIS — R531 Weakness: Secondary | ICD-10-CM | POA: Diagnosis not present

## 2018-03-22 DIAGNOSIS — S15001D Unspecified injury of right carotid artery, subsequent encounter: Secondary | ICD-10-CM | POA: Diagnosis not present

## 2018-03-22 DIAGNOSIS — R9439 Abnormal result of other cardiovascular function study: Secondary | ICD-10-CM | POA: Diagnosis not present

## 2018-03-22 DIAGNOSIS — J90 Pleural effusion, not elsewhere classified: Secondary | ICD-10-CM | POA: Diagnosis not present

## 2018-03-22 DIAGNOSIS — R0789 Other chest pain: Secondary | ICD-10-CM | POA: Diagnosis not present

## 2018-03-22 DIAGNOSIS — N189 Chronic kidney disease, unspecified: Secondary | ICD-10-CM | POA: Diagnosis not present

## 2018-03-22 DIAGNOSIS — R918 Other nonspecific abnormal finding of lung field: Secondary | ICD-10-CM | POA: Diagnosis not present

## 2018-03-22 DIAGNOSIS — R079 Chest pain, unspecified: Secondary | ICD-10-CM | POA: Diagnosis not present

## 2018-03-22 DIAGNOSIS — S15001A Unspecified injury of right carotid artery, initial encounter: Secondary | ICD-10-CM | POA: Insufficient documentation

## 2018-03-22 HISTORY — DX: Unspecified injury of right carotid artery, initial encounter: S15.001A

## 2018-03-23 DIAGNOSIS — S32048D Other fracture of fourth lumbar vertebra, subsequent encounter for fracture with routine healing: Secondary | ICD-10-CM | POA: Diagnosis not present

## 2018-03-23 DIAGNOSIS — S32401D Unspecified fracture of right acetabulum, subsequent encounter for fracture with routine healing: Secondary | ICD-10-CM | POA: Diagnosis not present

## 2018-03-23 DIAGNOSIS — Z86718 Personal history of other venous thrombosis and embolism: Secondary | ICD-10-CM | POA: Diagnosis not present

## 2018-03-23 DIAGNOSIS — I10 Essential (primary) hypertension: Secondary | ICD-10-CM | POA: Diagnosis not present

## 2018-03-23 DIAGNOSIS — Z794 Long term (current) use of insulin: Secondary | ICD-10-CM | POA: Diagnosis not present

## 2018-03-23 DIAGNOSIS — S12600D Unspecified displaced fracture of seventh cervical vertebra, subsequent encounter for fracture with routine healing: Secondary | ICD-10-CM | POA: Diagnosis not present

## 2018-03-23 DIAGNOSIS — S32028D Other fracture of second lumbar vertebra, subsequent encounter for fracture with routine healing: Secondary | ICD-10-CM | POA: Diagnosis not present

## 2018-03-23 DIAGNOSIS — L89150 Pressure ulcer of sacral region, unstageable: Secondary | ICD-10-CM | POA: Diagnosis not present

## 2018-03-23 DIAGNOSIS — S42301D Unspecified fracture of shaft of humerus, right arm, subsequent encounter for fracture with routine healing: Secondary | ICD-10-CM | POA: Diagnosis not present

## 2018-03-23 DIAGNOSIS — L89152 Pressure ulcer of sacral region, stage 2: Secondary | ICD-10-CM | POA: Diagnosis not present

## 2018-03-23 DIAGNOSIS — M069 Rheumatoid arthritis, unspecified: Secondary | ICD-10-CM | POA: Diagnosis not present

## 2018-03-23 DIAGNOSIS — E119 Type 2 diabetes mellitus without complications: Secondary | ICD-10-CM | POA: Diagnosis not present

## 2018-03-24 ENCOUNTER — Ambulatory Visit (INDEPENDENT_AMBULATORY_CARE_PROVIDER_SITE_OTHER): Payer: Medicare Other | Admitting: Sports Medicine

## 2018-03-24 ENCOUNTER — Encounter: Payer: Self-pay | Admitting: Sports Medicine

## 2018-03-24 VITALS — BP 151/77 | HR 68 | Resp 16

## 2018-03-24 DIAGNOSIS — M79674 Pain in right toe(s): Secondary | ICD-10-CM

## 2018-03-24 DIAGNOSIS — E1142 Type 2 diabetes mellitus with diabetic polyneuropathy: Secondary | ICD-10-CM

## 2018-03-24 DIAGNOSIS — M79675 Pain in left toe(s): Secondary | ICD-10-CM

## 2018-03-24 DIAGNOSIS — M2041 Other hammer toe(s) (acquired), right foot: Secondary | ICD-10-CM

## 2018-03-24 DIAGNOSIS — L84 Corns and callosities: Secondary | ICD-10-CM

## 2018-03-24 DIAGNOSIS — B351 Tinea unguium: Secondary | ICD-10-CM

## 2018-03-24 DIAGNOSIS — M2142 Flat foot [pes planus] (acquired), left foot: Secondary | ICD-10-CM

## 2018-03-24 DIAGNOSIS — I739 Peripheral vascular disease, unspecified: Secondary | ICD-10-CM

## 2018-03-24 DIAGNOSIS — M2141 Flat foot [pes planus] (acquired), right foot: Secondary | ICD-10-CM

## 2018-03-24 DIAGNOSIS — M2042 Other hammer toe(s) (acquired), left foot: Secondary | ICD-10-CM

## 2018-03-24 DIAGNOSIS — R600 Localized edema: Secondary | ICD-10-CM

## 2018-03-24 DIAGNOSIS — M21619 Bunion of unspecified foot: Secondary | ICD-10-CM

## 2018-03-24 NOTE — Progress Notes (Signed)
Subjective: Parthena Fergeson is a 77 y.o. female patient seen in office for evaluation of nails and calluses reports that her previous ulcers have stayed callused over however her daughter wanted her to have them checked because the calluses were getting larger.  Patient denies nausea vomiting fever chills or any other constitutional symptoms at this time.  Patient states that she has been admitted to [facility] where she is currently residing states that she did go to the ER on Wednesday because her blood pressure was higher than usual and her blood pressure was adjusted with medication and now today is 151/77.  Patient reports that her blood sugar today was 232 her last A1c 8.2 and her last visit with Dr. Lorin Picket was earlier this month.  Patient also desires diabetic shoes.  Patient has no other pedal complaints at this time.  Patient Active Problem List   Diagnosis Date Noted  . 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 PAF (paroxysmal atrial fibrillation) (HCC) 06/27/2017  . Coronary artery disease 12/08/2010  . Dyspnea 12/08/2010  . HYPERTENSION, BENIGN 04/01/2010  . RHEUMATOID ARTHRITIS 04/01/2010  . ABNORMAL CV (STRESS) TEST 04/01/2010  . LEG PAIN, RIGHT 03/12/2010   Current Outpatient Medications on File Prior to Visit  Medication Sig Dispense Refill  . amLODipine (NORVASC) 2.5 MG tablet Take 1 tablet (2.5 mg total) by mouth daily. 90 tablet 3  . aspirin 81 MG chewable tablet Chew 81 mg by mouth daily.    . B-D ULTRAFINE III SHORT PEN 31G X 8 MM MISC     . Cholecalciferol (VITAMIN D3) 2000 units TABS Take 1 tablet by mouth daily.    . cloNIDine (CATAPRES) 0.1 MG tablet Take 1 tablet by mouth Three times a day.    . diazepam (VALIUM) 5 MG tablet Take 5 mg by mouth 3 (three) times daily.      Marland Kitchen FREESTYLE LITE test strip     . furosemide (LASIX) 40 MG tablet Take 40 mg by mouth daily.      Marland Kitchen gabapentin (NEURONTIN) 100 MG capsule Take 100 mg by mouth 2 (two) times daily.    . insulin  NPH-insulin regular (NOVOLIN 70/30) (70-30) 100 UNIT/ML injection Inject 39 units into the skin each morning and 6 units into the skin each evening.    . lansoprazole (PREVACID) 30 MG capsule Take 30 mg by mouth daily.      Marland Kitchen leflunomide (ARAVA) 10 MG tablet Take 10 mg by mouth daily.    Marland Kitchen levothyroxine (SYNTHROID, LEVOTHROID) 75 MCG tablet Take 75 mcg by mouth daily.      Marland Kitchen lidocaine (LIDODERM) 5 % UNW AND APP 1 PA TO SKIN UP TO 16 H PER DAY  6  . metFORMIN (GLUCOPHAGE) 1000 MG tablet Take 500 mg by mouth 3 (three) times daily. Take 1/2 tab twice a day    . morphine (MS CONTIN) 15 MG 12 hr tablet Take by mouth.    . Multiple Vitamin (MULTIVITAMIN) tablet Take 1 tablet by mouth daily.    . mupirocin ointment (BACTROBAN) 2 % To right toe ulcer 30 g 0  . oxyCODONE (OXY IR/ROXICODONE) 5 MG immediate release tablet Take 5 mg by mouth 3 (three) times daily.     Marland Kitchen oxyCODONE (OXYCONTIN) 40 MG 12 hr tablet Take 40 mg by mouth every 8 (eight) hours.      . potassium chloride (KLOR-CON 10) 10 MEQ CR tablet Take 10 mEq by mouth 2 (two) times daily. When taking furosemide     .  predniSONE (DELTASONE) 5 MG tablet Take 5 mg by mouth 2 (two) times daily.     . rosuvastatin (CRESTOR) 5 MG tablet Take 0.5 tablets (2.5 mg total) by mouth daily. 30 tablet 3  . spironolactone (ALDACTONE) 25 MG tablet Take 25 mg by mouth 2 (two) times daily.    . Vancomycin HCl (VANCOMYCIN 2.5 MG/ML + HEPARIN 2500 UNITS/ML) 2.5 mLs by Intracatheter route.     Current Facility-Administered Medications on File Prior to Visit  Medication Dose Route Frequency Provider Last Rate Last Dose  . apixaban (ELIQUIS) tablet 5 mg  5 mg Oral BID Micki Riley, MD       Allergies  Allergen Reactions  . Gold Anaphylaxis    Swelling of lips/tongue, throat Swelling of lips/tongue, throat  . Nsaids Other (See Comments)    Intolerance Intolerance  . Sulfa Antibiotics Nausea Only  . Sulfonamide Derivatives   . Latex Other (See Comments)     blisters  . Sulfamethoxazole Nausea Only    No results found for this or any previous visit (from the past 2160 hour(s)).  Objective: There were no vitals filed for this visit.  General: Patient is awake, alert, oriented x 3 and in no acute distress.  Dermatology: Skin is warm and dry bilateral with continued healed ulcerations that have skin callus at the right great toe and plantar forefoot bilateral.  To the left lateral heel there is a blanchable pre-ulcerative lesion with no opening consistent with pressure sore, no other acute signs of infection.  Nails x10 are mildly elongated and thickened consistent with onychomycosis.   Vascular: Dorsalis Pedis pulse = 1/4 Bilateral,  Posterior Tibial pulse = 0/4 Bilateral,  Capillary Fill Time < 5 seconds. Chronic venous skin changes.  Neurologic: Protective sensation absent to the level of the ankles bilateral using  the 5.07/10g [employer].  Musculosketal: No Pain with palpation bilateral.  No pain with compression to calves bilateral. Planus and digital deformity with rigid hallux extensus on right and valgus deformity.   No results for input(s): GRAMSTAIN, LABORGA in the last 8760 hours.  Assessment and Plan:  Problem List Items Addressed This Visit    None    Visit Diagnoses    Diabetic polyneuropathy associated with type 2 diabetes mellitus (HCC)    -  Primary   Edema of both legs       PVD (peripheral vascular disease) (HCC)       Pain due to onychomycosis of toenails of both feet       Callus       Bunion       Hammer toes of both feet       Pes planus of both feet         -Patient seen and examined -Mechanically debrided nails x10 using sterile nail nipper without incident -Mechanically debrided calluses x3 using sterile chisel blade without incident -Recommend patient to tell facilities to keep her heels offloaded when she is in bed to prevent ulceration and to use protective Mepilex border dressing as  I have applied to her heel on today -[employer] diabetic shoe order form was completed; office to contact primary care for approval / certification;  Office to arrange shoe fitting and dispensing. -Patient to return to office for diabetic shoe measurements and as scheduled for routine care or sooner if problems arise.  Asencion Islam, DPM

## 2018-03-26 DIAGNOSIS — S32048D Other fracture of fourth lumbar vertebra, subsequent encounter for fracture with routine healing: Secondary | ICD-10-CM | POA: Diagnosis not present

## 2018-03-26 DIAGNOSIS — S42301D Unspecified fracture of shaft of humerus, right arm, subsequent encounter for fracture with routine healing: Secondary | ICD-10-CM | POA: Diagnosis not present

## 2018-03-26 DIAGNOSIS — S12600D Unspecified displaced fracture of seventh cervical vertebra, subsequent encounter for fracture with routine healing: Secondary | ICD-10-CM | POA: Diagnosis not present

## 2018-03-26 DIAGNOSIS — S32028D Other fracture of second lumbar vertebra, subsequent encounter for fracture with routine healing: Secondary | ICD-10-CM | POA: Diagnosis not present

## 2018-03-26 DIAGNOSIS — L89152 Pressure ulcer of sacral region, stage 2: Secondary | ICD-10-CM | POA: Diagnosis not present

## 2018-03-26 DIAGNOSIS — S32401D Unspecified fracture of right acetabulum, subsequent encounter for fracture with routine healing: Secondary | ICD-10-CM | POA: Diagnosis not present

## 2018-03-27 DIAGNOSIS — S90821A Blister (nonthermal), right foot, initial encounter: Secondary | ICD-10-CM | POA: Diagnosis not present

## 2018-03-27 DIAGNOSIS — Z6825 Body mass index (BMI) 25.0-25.9, adult: Secondary | ICD-10-CM | POA: Diagnosis not present

## 2018-03-27 DIAGNOSIS — L89150 Pressure ulcer of sacral region, unstageable: Secondary | ICD-10-CM | POA: Diagnosis not present

## 2018-03-27 DIAGNOSIS — I10 Essential (primary) hypertension: Secondary | ICD-10-CM | POA: Diagnosis not present

## 2018-03-27 DIAGNOSIS — S32401D Unspecified fracture of right acetabulum, subsequent encounter for fracture with routine healing: Secondary | ICD-10-CM | POA: Diagnosis not present

## 2018-03-27 DIAGNOSIS — S42301D Unspecified fracture of shaft of humerus, right arm, subsequent encounter for fracture with routine healing: Secondary | ICD-10-CM | POA: Diagnosis not present

## 2018-03-27 DIAGNOSIS — S12600D Unspecified displaced fracture of seventh cervical vertebra, subsequent encounter for fracture with routine healing: Secondary | ICD-10-CM | POA: Diagnosis not present

## 2018-03-27 DIAGNOSIS — L89152 Pressure ulcer of sacral region, stage 2: Secondary | ICD-10-CM | POA: Diagnosis not present

## 2018-03-27 DIAGNOSIS — S32048D Other fracture of fourth lumbar vertebra, subsequent encounter for fracture with routine healing: Secondary | ICD-10-CM | POA: Diagnosis not present

## 2018-03-27 DIAGNOSIS — S32028D Other fracture of second lumbar vertebra, subsequent encounter for fracture with routine healing: Secondary | ICD-10-CM | POA: Diagnosis not present

## 2018-03-27 DIAGNOSIS — E119 Type 2 diabetes mellitus without complications: Secondary | ICD-10-CM | POA: Diagnosis not present

## 2018-03-28 DIAGNOSIS — S32401D Unspecified fracture of right acetabulum, subsequent encounter for fracture with routine healing: Secondary | ICD-10-CM | POA: Diagnosis not present

## 2018-03-28 DIAGNOSIS — S32048D Other fracture of fourth lumbar vertebra, subsequent encounter for fracture with routine healing: Secondary | ICD-10-CM | POA: Diagnosis not present

## 2018-03-28 DIAGNOSIS — L89152 Pressure ulcer of sacral region, stage 2: Secondary | ICD-10-CM | POA: Diagnosis not present

## 2018-03-28 DIAGNOSIS — S42301D Unspecified fracture of shaft of humerus, right arm, subsequent encounter for fracture with routine healing: Secondary | ICD-10-CM | POA: Diagnosis not present

## 2018-03-28 DIAGNOSIS — R9431 Abnormal electrocardiogram [ECG] [EKG]: Secondary | ICD-10-CM | POA: Diagnosis not present

## 2018-03-28 DIAGNOSIS — S32028D Other fracture of second lumbar vertebra, subsequent encounter for fracture with routine healing: Secondary | ICD-10-CM | POA: Diagnosis not present

## 2018-03-28 DIAGNOSIS — S12600D Unspecified displaced fracture of seventh cervical vertebra, subsequent encounter for fracture with routine healing: Secondary | ICD-10-CM | POA: Diagnosis not present

## 2018-03-29 DIAGNOSIS — S12600D Unspecified displaced fracture of seventh cervical vertebra, subsequent encounter for fracture with routine healing: Secondary | ICD-10-CM | POA: Diagnosis not present

## 2018-03-29 DIAGNOSIS — S32028D Other fracture of second lumbar vertebra, subsequent encounter for fracture with routine healing: Secondary | ICD-10-CM | POA: Diagnosis not present

## 2018-03-29 DIAGNOSIS — S42301D Unspecified fracture of shaft of humerus, right arm, subsequent encounter for fracture with routine healing: Secondary | ICD-10-CM | POA: Diagnosis not present

## 2018-03-29 DIAGNOSIS — S32048D Other fracture of fourth lumbar vertebra, subsequent encounter for fracture with routine healing: Secondary | ICD-10-CM | POA: Diagnosis not present

## 2018-03-29 DIAGNOSIS — L89152 Pressure ulcer of sacral region, stage 2: Secondary | ICD-10-CM | POA: Diagnosis not present

## 2018-03-29 DIAGNOSIS — S32401D Unspecified fracture of right acetabulum, subsequent encounter for fracture with routine healing: Secondary | ICD-10-CM | POA: Diagnosis not present

## 2018-03-31 DIAGNOSIS — S32048D Other fracture of fourth lumbar vertebra, subsequent encounter for fracture with routine healing: Secondary | ICD-10-CM | POA: Diagnosis not present

## 2018-03-31 DIAGNOSIS — S32401D Unspecified fracture of right acetabulum, subsequent encounter for fracture with routine healing: Secondary | ICD-10-CM | POA: Diagnosis not present

## 2018-03-31 DIAGNOSIS — S42301D Unspecified fracture of shaft of humerus, right arm, subsequent encounter for fracture with routine healing: Secondary | ICD-10-CM | POA: Diagnosis not present

## 2018-03-31 DIAGNOSIS — S32028D Other fracture of second lumbar vertebra, subsequent encounter for fracture with routine healing: Secondary | ICD-10-CM | POA: Diagnosis not present

## 2018-03-31 DIAGNOSIS — S12600D Unspecified displaced fracture of seventh cervical vertebra, subsequent encounter for fracture with routine healing: Secondary | ICD-10-CM | POA: Diagnosis not present

## 2018-03-31 DIAGNOSIS — L89152 Pressure ulcer of sacral region, stage 2: Secondary | ICD-10-CM | POA: Diagnosis not present

## 2018-04-03 DIAGNOSIS — I10 Essential (primary) hypertension: Secondary | ICD-10-CM | POA: Diagnosis not present

## 2018-04-03 DIAGNOSIS — S42301D Unspecified fracture of shaft of humerus, right arm, subsequent encounter for fracture with routine healing: Secondary | ICD-10-CM | POA: Diagnosis not present

## 2018-04-03 DIAGNOSIS — L89152 Pressure ulcer of sacral region, stage 2: Secondary | ICD-10-CM | POA: Diagnosis not present

## 2018-04-03 DIAGNOSIS — L89153 Pressure ulcer of sacral region, stage 3: Secondary | ICD-10-CM | POA: Diagnosis not present

## 2018-04-03 DIAGNOSIS — E119 Type 2 diabetes mellitus without complications: Secondary | ICD-10-CM | POA: Diagnosis not present

## 2018-04-03 DIAGNOSIS — L89623 Pressure ulcer of left heel, stage 3: Secondary | ICD-10-CM | POA: Diagnosis not present

## 2018-04-03 DIAGNOSIS — S12600D Unspecified displaced fracture of seventh cervical vertebra, subsequent encounter for fracture with routine healing: Secondary | ICD-10-CM | POA: Diagnosis not present

## 2018-04-03 DIAGNOSIS — S32028D Other fracture of second lumbar vertebra, subsequent encounter for fracture with routine healing: Secondary | ICD-10-CM | POA: Diagnosis not present

## 2018-04-03 DIAGNOSIS — S32401D Unspecified fracture of right acetabulum, subsequent encounter for fracture with routine healing: Secondary | ICD-10-CM | POA: Diagnosis not present

## 2018-04-03 DIAGNOSIS — S32048D Other fracture of fourth lumbar vertebra, subsequent encounter for fracture with routine healing: Secondary | ICD-10-CM | POA: Diagnosis not present

## 2018-04-04 DIAGNOSIS — S42301D Unspecified fracture of shaft of humerus, right arm, subsequent encounter for fracture with routine healing: Secondary | ICD-10-CM | POA: Diagnosis not present

## 2018-04-04 DIAGNOSIS — S12600D Unspecified displaced fracture of seventh cervical vertebra, subsequent encounter for fracture with routine healing: Secondary | ICD-10-CM | POA: Diagnosis not present

## 2018-04-04 DIAGNOSIS — S32401D Unspecified fracture of right acetabulum, subsequent encounter for fracture with routine healing: Secondary | ICD-10-CM | POA: Diagnosis not present

## 2018-04-04 DIAGNOSIS — L89152 Pressure ulcer of sacral region, stage 2: Secondary | ICD-10-CM | POA: Diagnosis not present

## 2018-04-04 DIAGNOSIS — S32028D Other fracture of second lumbar vertebra, subsequent encounter for fracture with routine healing: Secondary | ICD-10-CM | POA: Diagnosis not present

## 2018-04-04 DIAGNOSIS — S32048D Other fracture of fourth lumbar vertebra, subsequent encounter for fracture with routine healing: Secondary | ICD-10-CM | POA: Diagnosis not present

## 2018-04-05 DIAGNOSIS — S32048D Other fracture of fourth lumbar vertebra, subsequent encounter for fracture with routine healing: Secondary | ICD-10-CM | POA: Diagnosis not present

## 2018-04-05 DIAGNOSIS — L89152 Pressure ulcer of sacral region, stage 2: Secondary | ICD-10-CM | POA: Diagnosis not present

## 2018-04-05 DIAGNOSIS — S12600D Unspecified displaced fracture of seventh cervical vertebra, subsequent encounter for fracture with routine healing: Secondary | ICD-10-CM | POA: Diagnosis not present

## 2018-04-05 DIAGNOSIS — S32028D Other fracture of second lumbar vertebra, subsequent encounter for fracture with routine healing: Secondary | ICD-10-CM | POA: Diagnosis not present

## 2018-04-05 DIAGNOSIS — S42301D Unspecified fracture of shaft of humerus, right arm, subsequent encounter for fracture with routine healing: Secondary | ICD-10-CM | POA: Diagnosis not present

## 2018-04-05 DIAGNOSIS — S32401D Unspecified fracture of right acetabulum, subsequent encounter for fracture with routine healing: Secondary | ICD-10-CM | POA: Diagnosis not present

## 2018-04-06 DIAGNOSIS — S32048D Other fracture of fourth lumbar vertebra, subsequent encounter for fracture with routine healing: Secondary | ICD-10-CM | POA: Diagnosis not present

## 2018-04-06 DIAGNOSIS — S12600D Unspecified displaced fracture of seventh cervical vertebra, subsequent encounter for fracture with routine healing: Secondary | ICD-10-CM | POA: Diagnosis not present

## 2018-04-06 DIAGNOSIS — S32401D Unspecified fracture of right acetabulum, subsequent encounter for fracture with routine healing: Secondary | ICD-10-CM | POA: Diagnosis not present

## 2018-04-06 DIAGNOSIS — S42301D Unspecified fracture of shaft of humerus, right arm, subsequent encounter for fracture with routine healing: Secondary | ICD-10-CM | POA: Diagnosis not present

## 2018-04-06 DIAGNOSIS — S32028D Other fracture of second lumbar vertebra, subsequent encounter for fracture with routine healing: Secondary | ICD-10-CM | POA: Diagnosis not present

## 2018-04-06 DIAGNOSIS — L89152 Pressure ulcer of sacral region, stage 2: Secondary | ICD-10-CM | POA: Diagnosis not present

## 2018-04-07 ENCOUNTER — Ambulatory Visit: Payer: Medicare Other | Admitting: Sports Medicine

## 2018-04-07 DIAGNOSIS — L89152 Pressure ulcer of sacral region, stage 2: Secondary | ICD-10-CM | POA: Diagnosis not present

## 2018-04-07 DIAGNOSIS — S12600D Unspecified displaced fracture of seventh cervical vertebra, subsequent encounter for fracture with routine healing: Secondary | ICD-10-CM | POA: Diagnosis not present

## 2018-04-07 DIAGNOSIS — S32401D Unspecified fracture of right acetabulum, subsequent encounter for fracture with routine healing: Secondary | ICD-10-CM | POA: Diagnosis not present

## 2018-04-07 DIAGNOSIS — S32028D Other fracture of second lumbar vertebra, subsequent encounter for fracture with routine healing: Secondary | ICD-10-CM | POA: Diagnosis not present

## 2018-04-07 DIAGNOSIS — S42301D Unspecified fracture of shaft of humerus, right arm, subsequent encounter for fracture with routine healing: Secondary | ICD-10-CM | POA: Diagnosis not present

## 2018-04-07 DIAGNOSIS — S32048D Other fracture of fourth lumbar vertebra, subsequent encounter for fracture with routine healing: Secondary | ICD-10-CM | POA: Diagnosis not present

## 2018-04-10 DIAGNOSIS — S32028D Other fracture of second lumbar vertebra, subsequent encounter for fracture with routine healing: Secondary | ICD-10-CM | POA: Diagnosis not present

## 2018-04-10 DIAGNOSIS — S32401D Unspecified fracture of right acetabulum, subsequent encounter for fracture with routine healing: Secondary | ICD-10-CM | POA: Diagnosis not present

## 2018-04-10 DIAGNOSIS — S32048D Other fracture of fourth lumbar vertebra, subsequent encounter for fracture with routine healing: Secondary | ICD-10-CM | POA: Diagnosis not present

## 2018-04-10 DIAGNOSIS — S42301D Unspecified fracture of shaft of humerus, right arm, subsequent encounter for fracture with routine healing: Secondary | ICD-10-CM | POA: Diagnosis not present

## 2018-04-10 DIAGNOSIS — L89622 Pressure ulcer of left heel, stage 2: Secondary | ICD-10-CM | POA: Diagnosis not present

## 2018-04-10 DIAGNOSIS — S12600D Unspecified displaced fracture of seventh cervical vertebra, subsequent encounter for fracture with routine healing: Secondary | ICD-10-CM | POA: Diagnosis not present

## 2018-04-10 DIAGNOSIS — L89152 Pressure ulcer of sacral region, stage 2: Secondary | ICD-10-CM | POA: Diagnosis not present

## 2018-04-10 DIAGNOSIS — I10 Essential (primary) hypertension: Secondary | ICD-10-CM | POA: Diagnosis not present

## 2018-04-10 DIAGNOSIS — E119 Type 2 diabetes mellitus without complications: Secondary | ICD-10-CM | POA: Diagnosis not present

## 2018-04-12 DIAGNOSIS — S32048D Other fracture of fourth lumbar vertebra, subsequent encounter for fracture with routine healing: Secondary | ICD-10-CM | POA: Diagnosis not present

## 2018-04-12 DIAGNOSIS — S42301D Unspecified fracture of shaft of humerus, right arm, subsequent encounter for fracture with routine healing: Secondary | ICD-10-CM | POA: Diagnosis not present

## 2018-04-12 DIAGNOSIS — S32028D Other fracture of second lumbar vertebra, subsequent encounter for fracture with routine healing: Secondary | ICD-10-CM | POA: Diagnosis not present

## 2018-04-12 DIAGNOSIS — S32401D Unspecified fracture of right acetabulum, subsequent encounter for fracture with routine healing: Secondary | ICD-10-CM | POA: Diagnosis not present

## 2018-04-12 DIAGNOSIS — S12600D Unspecified displaced fracture of seventh cervical vertebra, subsequent encounter for fracture with routine healing: Secondary | ICD-10-CM | POA: Diagnosis not present

## 2018-04-12 DIAGNOSIS — L89152 Pressure ulcer of sacral region, stage 2: Secondary | ICD-10-CM | POA: Diagnosis not present

## 2018-04-13 DIAGNOSIS — L89152 Pressure ulcer of sacral region, stage 2: Secondary | ICD-10-CM | POA: Diagnosis not present

## 2018-04-13 DIAGNOSIS — S32401D Unspecified fracture of right acetabulum, subsequent encounter for fracture with routine healing: Secondary | ICD-10-CM | POA: Diagnosis not present

## 2018-04-13 DIAGNOSIS — S12600D Unspecified displaced fracture of seventh cervical vertebra, subsequent encounter for fracture with routine healing: Secondary | ICD-10-CM | POA: Diagnosis not present

## 2018-04-13 DIAGNOSIS — S42301D Unspecified fracture of shaft of humerus, right arm, subsequent encounter for fracture with routine healing: Secondary | ICD-10-CM | POA: Diagnosis not present

## 2018-04-13 DIAGNOSIS — S32028D Other fracture of second lumbar vertebra, subsequent encounter for fracture with routine healing: Secondary | ICD-10-CM | POA: Diagnosis not present

## 2018-04-13 DIAGNOSIS — S32048D Other fracture of fourth lumbar vertebra, subsequent encounter for fracture with routine healing: Secondary | ICD-10-CM | POA: Diagnosis not present

## 2018-04-14 DIAGNOSIS — S12600D Unspecified displaced fracture of seventh cervical vertebra, subsequent encounter for fracture with routine healing: Secondary | ICD-10-CM | POA: Diagnosis not present

## 2018-04-14 DIAGNOSIS — S32401D Unspecified fracture of right acetabulum, subsequent encounter for fracture with routine healing: Secondary | ICD-10-CM | POA: Diagnosis not present

## 2018-04-14 DIAGNOSIS — S32028D Other fracture of second lumbar vertebra, subsequent encounter for fracture with routine healing: Secondary | ICD-10-CM | POA: Diagnosis not present

## 2018-04-14 DIAGNOSIS — L89152 Pressure ulcer of sacral region, stage 2: Secondary | ICD-10-CM | POA: Diagnosis not present

## 2018-04-14 DIAGNOSIS — S32048D Other fracture of fourth lumbar vertebra, subsequent encounter for fracture with routine healing: Secondary | ICD-10-CM | POA: Diagnosis not present

## 2018-04-14 DIAGNOSIS — S42301D Unspecified fracture of shaft of humerus, right arm, subsequent encounter for fracture with routine healing: Secondary | ICD-10-CM | POA: Diagnosis not present

## 2018-04-17 DIAGNOSIS — I10 Essential (primary) hypertension: Secondary | ICD-10-CM | POA: Diagnosis not present

## 2018-04-17 DIAGNOSIS — L89152 Pressure ulcer of sacral region, stage 2: Secondary | ICD-10-CM | POA: Diagnosis not present

## 2018-04-17 DIAGNOSIS — E119 Type 2 diabetes mellitus without complications: Secondary | ICD-10-CM | POA: Diagnosis not present

## 2018-04-17 DIAGNOSIS — L89622 Pressure ulcer of left heel, stage 2: Secondary | ICD-10-CM | POA: Diagnosis not present

## 2018-04-18 DIAGNOSIS — L89152 Pressure ulcer of sacral region, stage 2: Secondary | ICD-10-CM | POA: Diagnosis not present

## 2018-04-18 DIAGNOSIS — S32028D Other fracture of second lumbar vertebra, subsequent encounter for fracture with routine healing: Secondary | ICD-10-CM | POA: Diagnosis not present

## 2018-04-18 DIAGNOSIS — S32048D Other fracture of fourth lumbar vertebra, subsequent encounter for fracture with routine healing: Secondary | ICD-10-CM | POA: Diagnosis not present

## 2018-04-18 DIAGNOSIS — S12600D Unspecified displaced fracture of seventh cervical vertebra, subsequent encounter for fracture with routine healing: Secondary | ICD-10-CM | POA: Diagnosis not present

## 2018-04-18 DIAGNOSIS — S42301D Unspecified fracture of shaft of humerus, right arm, subsequent encounter for fracture with routine healing: Secondary | ICD-10-CM | POA: Diagnosis not present

## 2018-04-18 DIAGNOSIS — S32401D Unspecified fracture of right acetabulum, subsequent encounter for fracture with routine healing: Secondary | ICD-10-CM | POA: Diagnosis not present

## 2018-04-19 DIAGNOSIS — S32028D Other fracture of second lumbar vertebra, subsequent encounter for fracture with routine healing: Secondary | ICD-10-CM | POA: Diagnosis not present

## 2018-04-19 DIAGNOSIS — L89152 Pressure ulcer of sacral region, stage 2: Secondary | ICD-10-CM | POA: Diagnosis not present

## 2018-04-19 DIAGNOSIS — S32048D Other fracture of fourth lumbar vertebra, subsequent encounter for fracture with routine healing: Secondary | ICD-10-CM | POA: Diagnosis not present

## 2018-04-19 DIAGNOSIS — S12600D Unspecified displaced fracture of seventh cervical vertebra, subsequent encounter for fracture with routine healing: Secondary | ICD-10-CM | POA: Diagnosis not present

## 2018-04-19 DIAGNOSIS — S32401D Unspecified fracture of right acetabulum, subsequent encounter for fracture with routine healing: Secondary | ICD-10-CM | POA: Diagnosis not present

## 2018-04-19 DIAGNOSIS — S42301D Unspecified fracture of shaft of humerus, right arm, subsequent encounter for fracture with routine healing: Secondary | ICD-10-CM | POA: Diagnosis not present

## 2018-04-20 DIAGNOSIS — S32028D Other fracture of second lumbar vertebra, subsequent encounter for fracture with routine healing: Secondary | ICD-10-CM | POA: Diagnosis not present

## 2018-04-20 DIAGNOSIS — L89152 Pressure ulcer of sacral region, stage 2: Secondary | ICD-10-CM | POA: Diagnosis not present

## 2018-04-20 DIAGNOSIS — S32048D Other fracture of fourth lumbar vertebra, subsequent encounter for fracture with routine healing: Secondary | ICD-10-CM | POA: Diagnosis not present

## 2018-04-20 DIAGNOSIS — S32401D Unspecified fracture of right acetabulum, subsequent encounter for fracture with routine healing: Secondary | ICD-10-CM | POA: Diagnosis not present

## 2018-04-20 DIAGNOSIS — S42301D Unspecified fracture of shaft of humerus, right arm, subsequent encounter for fracture with routine healing: Secondary | ICD-10-CM | POA: Diagnosis not present

## 2018-04-20 DIAGNOSIS — S12600D Unspecified displaced fracture of seventh cervical vertebra, subsequent encounter for fracture with routine healing: Secondary | ICD-10-CM | POA: Diagnosis not present

## 2018-04-21 DIAGNOSIS — S32028D Other fracture of second lumbar vertebra, subsequent encounter for fracture with routine healing: Secondary | ICD-10-CM | POA: Diagnosis not present

## 2018-04-21 DIAGNOSIS — S32048D Other fracture of fourth lumbar vertebra, subsequent encounter for fracture with routine healing: Secondary | ICD-10-CM | POA: Diagnosis not present

## 2018-04-21 DIAGNOSIS — L89152 Pressure ulcer of sacral region, stage 2: Secondary | ICD-10-CM | POA: Diagnosis not present

## 2018-04-21 DIAGNOSIS — S32401D Unspecified fracture of right acetabulum, subsequent encounter for fracture with routine healing: Secondary | ICD-10-CM | POA: Diagnosis not present

## 2018-04-21 DIAGNOSIS — S12600D Unspecified displaced fracture of seventh cervical vertebra, subsequent encounter for fracture with routine healing: Secondary | ICD-10-CM | POA: Diagnosis not present

## 2018-04-21 DIAGNOSIS — S42301D Unspecified fracture of shaft of humerus, right arm, subsequent encounter for fracture with routine healing: Secondary | ICD-10-CM | POA: Diagnosis not present

## 2018-04-24 DIAGNOSIS — L89152 Pressure ulcer of sacral region, stage 2: Secondary | ICD-10-CM | POA: Diagnosis not present

## 2018-04-24 DIAGNOSIS — S32028D Other fracture of second lumbar vertebra, subsequent encounter for fracture with routine healing: Secondary | ICD-10-CM | POA: Diagnosis not present

## 2018-04-24 DIAGNOSIS — S32048D Other fracture of fourth lumbar vertebra, subsequent encounter for fracture with routine healing: Secondary | ICD-10-CM | POA: Diagnosis not present

## 2018-04-24 DIAGNOSIS — S12600D Unspecified displaced fracture of seventh cervical vertebra, subsequent encounter for fracture with routine healing: Secondary | ICD-10-CM | POA: Diagnosis not present

## 2018-04-24 DIAGNOSIS — S32401D Unspecified fracture of right acetabulum, subsequent encounter for fracture with routine healing: Secondary | ICD-10-CM | POA: Diagnosis not present

## 2018-04-24 DIAGNOSIS — S42301D Unspecified fracture of shaft of humerus, right arm, subsequent encounter for fracture with routine healing: Secondary | ICD-10-CM | POA: Diagnosis not present

## 2018-04-24 DIAGNOSIS — L03116 Cellulitis of left lower limb: Secondary | ICD-10-CM | POA: Diagnosis not present

## 2018-04-24 DIAGNOSIS — L89622 Pressure ulcer of left heel, stage 2: Secondary | ICD-10-CM | POA: Diagnosis not present

## 2018-04-24 DIAGNOSIS — I10 Essential (primary) hypertension: Secondary | ICD-10-CM | POA: Diagnosis not present

## 2018-04-24 DIAGNOSIS — M79672 Pain in left foot: Secondary | ICD-10-CM | POA: Diagnosis not present

## 2018-04-24 DIAGNOSIS — E119 Type 2 diabetes mellitus without complications: Secondary | ICD-10-CM | POA: Diagnosis not present

## 2018-04-27 DIAGNOSIS — S42301D Unspecified fracture of shaft of humerus, right arm, subsequent encounter for fracture with routine healing: Secondary | ICD-10-CM | POA: Diagnosis not present

## 2018-04-27 DIAGNOSIS — S12600D Unspecified displaced fracture of seventh cervical vertebra, subsequent encounter for fracture with routine healing: Secondary | ICD-10-CM | POA: Diagnosis not present

## 2018-04-27 DIAGNOSIS — S32401D Unspecified fracture of right acetabulum, subsequent encounter for fracture with routine healing: Secondary | ICD-10-CM | POA: Diagnosis not present

## 2018-04-27 DIAGNOSIS — S32028D Other fracture of second lumbar vertebra, subsequent encounter for fracture with routine healing: Secondary | ICD-10-CM | POA: Diagnosis not present

## 2018-04-27 DIAGNOSIS — L97929 Non-pressure chronic ulcer of unspecified part of left lower leg with unspecified severity: Secondary | ICD-10-CM | POA: Diagnosis not present

## 2018-04-27 DIAGNOSIS — L89152 Pressure ulcer of sacral region, stage 2: Secondary | ICD-10-CM | POA: Diagnosis not present

## 2018-04-27 DIAGNOSIS — S32048D Other fracture of fourth lumbar vertebra, subsequent encounter for fracture with routine healing: Secondary | ICD-10-CM | POA: Diagnosis not present

## 2018-04-27 DIAGNOSIS — E119 Type 2 diabetes mellitus without complications: Secondary | ICD-10-CM | POA: Diagnosis not present

## 2018-04-28 DIAGNOSIS — S12600D Unspecified displaced fracture of seventh cervical vertebra, subsequent encounter for fracture with routine healing: Secondary | ICD-10-CM | POA: Diagnosis not present

## 2018-04-28 DIAGNOSIS — L89152 Pressure ulcer of sacral region, stage 2: Secondary | ICD-10-CM | POA: Diagnosis not present

## 2018-04-28 DIAGNOSIS — S42301D Unspecified fracture of shaft of humerus, right arm, subsequent encounter for fracture with routine healing: Secondary | ICD-10-CM | POA: Diagnosis not present

## 2018-04-28 DIAGNOSIS — S32048D Other fracture of fourth lumbar vertebra, subsequent encounter for fracture with routine healing: Secondary | ICD-10-CM | POA: Diagnosis not present

## 2018-04-28 DIAGNOSIS — S32401D Unspecified fracture of right acetabulum, subsequent encounter for fracture with routine healing: Secondary | ICD-10-CM | POA: Diagnosis not present

## 2018-04-28 DIAGNOSIS — S32028D Other fracture of second lumbar vertebra, subsequent encounter for fracture with routine healing: Secondary | ICD-10-CM | POA: Diagnosis not present

## 2018-05-01 DIAGNOSIS — E119 Type 2 diabetes mellitus without complications: Secondary | ICD-10-CM | POA: Diagnosis not present

## 2018-05-01 DIAGNOSIS — S12600D Unspecified displaced fracture of seventh cervical vertebra, subsequent encounter for fracture with routine healing: Secondary | ICD-10-CM | POA: Diagnosis not present

## 2018-05-01 DIAGNOSIS — I10 Essential (primary) hypertension: Secondary | ICD-10-CM | POA: Diagnosis not present

## 2018-05-01 DIAGNOSIS — S32028D Other fracture of second lumbar vertebra, subsequent encounter for fracture with routine healing: Secondary | ICD-10-CM | POA: Diagnosis not present

## 2018-05-01 DIAGNOSIS — S32401D Unspecified fracture of right acetabulum, subsequent encounter for fracture with routine healing: Secondary | ICD-10-CM | POA: Diagnosis not present

## 2018-05-01 DIAGNOSIS — L89622 Pressure ulcer of left heel, stage 2: Secondary | ICD-10-CM | POA: Diagnosis not present

## 2018-05-01 DIAGNOSIS — S42301D Unspecified fracture of shaft of humerus, right arm, subsequent encounter for fracture with routine healing: Secondary | ICD-10-CM | POA: Diagnosis not present

## 2018-05-01 DIAGNOSIS — S32048D Other fracture of fourth lumbar vertebra, subsequent encounter for fracture with routine healing: Secondary | ICD-10-CM | POA: Diagnosis not present

## 2018-05-01 DIAGNOSIS — L89152 Pressure ulcer of sacral region, stage 2: Secondary | ICD-10-CM | POA: Diagnosis not present

## 2018-05-02 DIAGNOSIS — N183 Chronic kidney disease, stage 3 (moderate): Secondary | ICD-10-CM | POA: Diagnosis not present

## 2018-05-02 DIAGNOSIS — S12600D Unspecified displaced fracture of seventh cervical vertebra, subsequent encounter for fracture with routine healing: Secondary | ICD-10-CM | POA: Diagnosis not present

## 2018-05-02 DIAGNOSIS — E1122 Type 2 diabetes mellitus with diabetic chronic kidney disease: Secondary | ICD-10-CM | POA: Diagnosis not present

## 2018-05-02 DIAGNOSIS — Z7982 Long term (current) use of aspirin: Secondary | ICD-10-CM | POA: Diagnosis not present

## 2018-05-02 DIAGNOSIS — L89152 Pressure ulcer of sacral region, stage 2: Secondary | ICD-10-CM | POA: Diagnosis not present

## 2018-05-02 DIAGNOSIS — S32401D Unspecified fracture of right acetabulum, subsequent encounter for fracture with routine healing: Secondary | ICD-10-CM | POA: Diagnosis not present

## 2018-05-02 DIAGNOSIS — I129 Hypertensive chronic kidney disease with stage 1 through stage 4 chronic kidney disease, or unspecified chronic kidney disease: Secondary | ICD-10-CM | POA: Diagnosis not present

## 2018-05-02 DIAGNOSIS — Z86718 Personal history of other venous thrombosis and embolism: Secondary | ICD-10-CM | POA: Diagnosis not present

## 2018-05-02 DIAGNOSIS — R911 Solitary pulmonary nodule: Secondary | ICD-10-CM | POA: Diagnosis not present

## 2018-05-02 DIAGNOSIS — S42301D Unspecified fracture of shaft of humerus, right arm, subsequent encounter for fracture with routine healing: Secondary | ICD-10-CM | POA: Diagnosis not present

## 2018-05-02 DIAGNOSIS — S32028D Other fracture of second lumbar vertebra, subsequent encounter for fracture with routine healing: Secondary | ICD-10-CM | POA: Diagnosis not present

## 2018-05-02 DIAGNOSIS — M0609 Rheumatoid arthritis without rheumatoid factor, multiple sites: Secondary | ICD-10-CM | POA: Diagnosis not present

## 2018-05-02 DIAGNOSIS — Z79891 Long term (current) use of opiate analgesic: Secondary | ICD-10-CM | POA: Diagnosis not present

## 2018-05-02 DIAGNOSIS — Z602 Problems related to living alone: Secondary | ICD-10-CM | POA: Diagnosis not present

## 2018-05-02 DIAGNOSIS — Z7952 Long term (current) use of systemic steroids: Secondary | ICD-10-CM | POA: Diagnosis not present

## 2018-05-02 DIAGNOSIS — S32048D Other fracture of fourth lumbar vertebra, subsequent encounter for fracture with routine healing: Secondary | ICD-10-CM | POA: Diagnosis not present

## 2018-05-02 DIAGNOSIS — E039 Hypothyroidism, unspecified: Secondary | ICD-10-CM | POA: Diagnosis not present

## 2018-05-02 DIAGNOSIS — E1142 Type 2 diabetes mellitus with diabetic polyneuropathy: Secondary | ICD-10-CM | POA: Diagnosis not present

## 2018-05-02 DIAGNOSIS — Z794 Long term (current) use of insulin: Secondary | ICD-10-CM | POA: Diagnosis not present

## 2018-05-02 DIAGNOSIS — G8929 Other chronic pain: Secondary | ICD-10-CM | POA: Diagnosis not present

## 2018-05-04 DIAGNOSIS — N183 Chronic kidney disease, stage 3 (moderate): Secondary | ICD-10-CM | POA: Diagnosis not present

## 2018-05-04 DIAGNOSIS — I129 Hypertensive chronic kidney disease with stage 1 through stage 4 chronic kidney disease, or unspecified chronic kidney disease: Secondary | ICD-10-CM | POA: Diagnosis not present

## 2018-05-04 DIAGNOSIS — E1122 Type 2 diabetes mellitus with diabetic chronic kidney disease: Secondary | ICD-10-CM | POA: Diagnosis not present

## 2018-05-04 DIAGNOSIS — L89152 Pressure ulcer of sacral region, stage 2: Secondary | ICD-10-CM | POA: Diagnosis not present

## 2018-05-04 DIAGNOSIS — S12600D Unspecified displaced fracture of seventh cervical vertebra, subsequent encounter for fracture with routine healing: Secondary | ICD-10-CM | POA: Diagnosis not present

## 2018-05-04 DIAGNOSIS — E1142 Type 2 diabetes mellitus with diabetic polyneuropathy: Secondary | ICD-10-CM | POA: Diagnosis not present

## 2018-05-08 DIAGNOSIS — E119 Type 2 diabetes mellitus without complications: Secondary | ICD-10-CM | POA: Diagnosis not present

## 2018-05-08 DIAGNOSIS — E1122 Type 2 diabetes mellitus with diabetic chronic kidney disease: Secondary | ICD-10-CM | POA: Diagnosis not present

## 2018-05-08 DIAGNOSIS — L89152 Pressure ulcer of sacral region, stage 2: Secondary | ICD-10-CM | POA: Diagnosis not present

## 2018-05-08 DIAGNOSIS — N183 Chronic kidney disease, stage 3 (moderate): Secondary | ICD-10-CM | POA: Diagnosis not present

## 2018-05-08 DIAGNOSIS — E1142 Type 2 diabetes mellitus with diabetic polyneuropathy: Secondary | ICD-10-CM | POA: Diagnosis not present

## 2018-05-08 DIAGNOSIS — I10 Essential (primary) hypertension: Secondary | ICD-10-CM | POA: Diagnosis not present

## 2018-05-08 DIAGNOSIS — L89622 Pressure ulcer of left heel, stage 2: Secondary | ICD-10-CM | POA: Diagnosis not present

## 2018-05-08 DIAGNOSIS — S12600D Unspecified displaced fracture of seventh cervical vertebra, subsequent encounter for fracture with routine healing: Secondary | ICD-10-CM | POA: Diagnosis not present

## 2018-05-08 DIAGNOSIS — I129 Hypertensive chronic kidney disease with stage 1 through stage 4 chronic kidney disease, or unspecified chronic kidney disease: Secondary | ICD-10-CM | POA: Diagnosis not present

## 2018-05-09 DIAGNOSIS — E1142 Type 2 diabetes mellitus with diabetic polyneuropathy: Secondary | ICD-10-CM | POA: Diagnosis not present

## 2018-05-09 DIAGNOSIS — S12600D Unspecified displaced fracture of seventh cervical vertebra, subsequent encounter for fracture with routine healing: Secondary | ICD-10-CM | POA: Diagnosis not present

## 2018-05-09 DIAGNOSIS — L89152 Pressure ulcer of sacral region, stage 2: Secondary | ICD-10-CM | POA: Diagnosis not present

## 2018-05-09 DIAGNOSIS — I129 Hypertensive chronic kidney disease with stage 1 through stage 4 chronic kidney disease, or unspecified chronic kidney disease: Secondary | ICD-10-CM | POA: Diagnosis not present

## 2018-05-09 DIAGNOSIS — N183 Chronic kidney disease, stage 3 (moderate): Secondary | ICD-10-CM | POA: Diagnosis not present

## 2018-05-09 DIAGNOSIS — E1122 Type 2 diabetes mellitus with diabetic chronic kidney disease: Secondary | ICD-10-CM | POA: Diagnosis not present

## 2018-05-10 DIAGNOSIS — N183 Chronic kidney disease, stage 3 (moderate): Secondary | ICD-10-CM | POA: Diagnosis not present

## 2018-05-10 DIAGNOSIS — E1122 Type 2 diabetes mellitus with diabetic chronic kidney disease: Secondary | ICD-10-CM | POA: Diagnosis not present

## 2018-05-10 DIAGNOSIS — E1142 Type 2 diabetes mellitus with diabetic polyneuropathy: Secondary | ICD-10-CM | POA: Diagnosis not present

## 2018-05-10 DIAGNOSIS — L89152 Pressure ulcer of sacral region, stage 2: Secondary | ICD-10-CM | POA: Diagnosis not present

## 2018-05-10 DIAGNOSIS — S12600D Unspecified displaced fracture of seventh cervical vertebra, subsequent encounter for fracture with routine healing: Secondary | ICD-10-CM | POA: Diagnosis not present

## 2018-05-10 DIAGNOSIS — I129 Hypertensive chronic kidney disease with stage 1 through stage 4 chronic kidney disease, or unspecified chronic kidney disease: Secondary | ICD-10-CM | POA: Diagnosis not present

## 2018-05-11 DIAGNOSIS — E1142 Type 2 diabetes mellitus with diabetic polyneuropathy: Secondary | ICD-10-CM | POA: Diagnosis not present

## 2018-05-11 DIAGNOSIS — E1122 Type 2 diabetes mellitus with diabetic chronic kidney disease: Secondary | ICD-10-CM | POA: Diagnosis not present

## 2018-05-11 DIAGNOSIS — N183 Chronic kidney disease, stage 3 (moderate): Secondary | ICD-10-CM | POA: Diagnosis not present

## 2018-05-11 DIAGNOSIS — S12600D Unspecified displaced fracture of seventh cervical vertebra, subsequent encounter for fracture with routine healing: Secondary | ICD-10-CM | POA: Diagnosis not present

## 2018-05-11 DIAGNOSIS — I129 Hypertensive chronic kidney disease with stage 1 through stage 4 chronic kidney disease, or unspecified chronic kidney disease: Secondary | ICD-10-CM | POA: Diagnosis not present

## 2018-05-11 DIAGNOSIS — L89152 Pressure ulcer of sacral region, stage 2: Secondary | ICD-10-CM | POA: Diagnosis not present

## 2018-05-12 DIAGNOSIS — S12600D Unspecified displaced fracture of seventh cervical vertebra, subsequent encounter for fracture with routine healing: Secondary | ICD-10-CM | POA: Diagnosis not present

## 2018-05-12 DIAGNOSIS — E1122 Type 2 diabetes mellitus with diabetic chronic kidney disease: Secondary | ICD-10-CM | POA: Diagnosis not present

## 2018-05-12 DIAGNOSIS — I129 Hypertensive chronic kidney disease with stage 1 through stage 4 chronic kidney disease, or unspecified chronic kidney disease: Secondary | ICD-10-CM | POA: Diagnosis not present

## 2018-05-12 DIAGNOSIS — L89152 Pressure ulcer of sacral region, stage 2: Secondary | ICD-10-CM | POA: Diagnosis not present

## 2018-05-12 DIAGNOSIS — E1142 Type 2 diabetes mellitus with diabetic polyneuropathy: Secondary | ICD-10-CM | POA: Diagnosis not present

## 2018-05-12 DIAGNOSIS — N183 Chronic kidney disease, stage 3 (moderate): Secondary | ICD-10-CM | POA: Diagnosis not present

## 2018-05-13 DIAGNOSIS — E1142 Type 2 diabetes mellitus with diabetic polyneuropathy: Secondary | ICD-10-CM | POA: Diagnosis not present

## 2018-05-13 DIAGNOSIS — E1122 Type 2 diabetes mellitus with diabetic chronic kidney disease: Secondary | ICD-10-CM | POA: Diagnosis not present

## 2018-05-13 DIAGNOSIS — L89152 Pressure ulcer of sacral region, stage 2: Secondary | ICD-10-CM | POA: Diagnosis not present

## 2018-05-13 DIAGNOSIS — I129 Hypertensive chronic kidney disease with stage 1 through stage 4 chronic kidney disease, or unspecified chronic kidney disease: Secondary | ICD-10-CM | POA: Diagnosis not present

## 2018-05-13 DIAGNOSIS — S12600D Unspecified displaced fracture of seventh cervical vertebra, subsequent encounter for fracture with routine healing: Secondary | ICD-10-CM | POA: Diagnosis not present

## 2018-05-13 DIAGNOSIS — N183 Chronic kidney disease, stage 3 (moderate): Secondary | ICD-10-CM | POA: Diagnosis not present

## 2018-05-14 DIAGNOSIS — L89152 Pressure ulcer of sacral region, stage 2: Secondary | ICD-10-CM | POA: Diagnosis not present

## 2018-05-14 DIAGNOSIS — I129 Hypertensive chronic kidney disease with stage 1 through stage 4 chronic kidney disease, or unspecified chronic kidney disease: Secondary | ICD-10-CM | POA: Diagnosis not present

## 2018-05-14 DIAGNOSIS — N183 Chronic kidney disease, stage 3 (moderate): Secondary | ICD-10-CM | POA: Diagnosis not present

## 2018-05-14 DIAGNOSIS — E1122 Type 2 diabetes mellitus with diabetic chronic kidney disease: Secondary | ICD-10-CM | POA: Diagnosis not present

## 2018-05-14 DIAGNOSIS — E1142 Type 2 diabetes mellitus with diabetic polyneuropathy: Secondary | ICD-10-CM | POA: Diagnosis not present

## 2018-05-14 DIAGNOSIS — S12600D Unspecified displaced fracture of seventh cervical vertebra, subsequent encounter for fracture with routine healing: Secondary | ICD-10-CM | POA: Diagnosis not present

## 2018-05-15 DIAGNOSIS — L89152 Pressure ulcer of sacral region, stage 2: Secondary | ICD-10-CM | POA: Diagnosis not present

## 2018-05-15 DIAGNOSIS — I10 Essential (primary) hypertension: Secondary | ICD-10-CM | POA: Diagnosis not present

## 2018-05-15 DIAGNOSIS — L89622 Pressure ulcer of left heel, stage 2: Secondary | ICD-10-CM | POA: Diagnosis not present

## 2018-05-16 DIAGNOSIS — E1122 Type 2 diabetes mellitus with diabetic chronic kidney disease: Secondary | ICD-10-CM | POA: Diagnosis not present

## 2018-05-16 DIAGNOSIS — I129 Hypertensive chronic kidney disease with stage 1 through stage 4 chronic kidney disease, or unspecified chronic kidney disease: Secondary | ICD-10-CM | POA: Diagnosis not present

## 2018-05-16 DIAGNOSIS — L89152 Pressure ulcer of sacral region, stage 2: Secondary | ICD-10-CM | POA: Diagnosis not present

## 2018-05-16 DIAGNOSIS — S12600D Unspecified displaced fracture of seventh cervical vertebra, subsequent encounter for fracture with routine healing: Secondary | ICD-10-CM | POA: Diagnosis not present

## 2018-05-16 DIAGNOSIS — N183 Chronic kidney disease, stage 3 (moderate): Secondary | ICD-10-CM | POA: Diagnosis not present

## 2018-05-16 DIAGNOSIS — E1142 Type 2 diabetes mellitus with diabetic polyneuropathy: Secondary | ICD-10-CM | POA: Diagnosis not present

## 2018-05-17 DIAGNOSIS — L89152 Pressure ulcer of sacral region, stage 2: Secondary | ICD-10-CM | POA: Diagnosis not present

## 2018-05-17 DIAGNOSIS — E1122 Type 2 diabetes mellitus with diabetic chronic kidney disease: Secondary | ICD-10-CM | POA: Diagnosis not present

## 2018-05-17 DIAGNOSIS — I129 Hypertensive chronic kidney disease with stage 1 through stage 4 chronic kidney disease, or unspecified chronic kidney disease: Secondary | ICD-10-CM | POA: Diagnosis not present

## 2018-05-17 DIAGNOSIS — S12600D Unspecified displaced fracture of seventh cervical vertebra, subsequent encounter for fracture with routine healing: Secondary | ICD-10-CM | POA: Diagnosis not present

## 2018-05-17 DIAGNOSIS — E1142 Type 2 diabetes mellitus with diabetic polyneuropathy: Secondary | ICD-10-CM | POA: Diagnosis not present

## 2018-05-17 DIAGNOSIS — N183 Chronic kidney disease, stage 3 (moderate): Secondary | ICD-10-CM | POA: Diagnosis not present

## 2018-05-18 DIAGNOSIS — E1122 Type 2 diabetes mellitus with diabetic chronic kidney disease: Secondary | ICD-10-CM | POA: Diagnosis not present

## 2018-05-18 DIAGNOSIS — I129 Hypertensive chronic kidney disease with stage 1 through stage 4 chronic kidney disease, or unspecified chronic kidney disease: Secondary | ICD-10-CM | POA: Diagnosis not present

## 2018-05-18 DIAGNOSIS — E1142 Type 2 diabetes mellitus with diabetic polyneuropathy: Secondary | ICD-10-CM | POA: Diagnosis not present

## 2018-05-18 DIAGNOSIS — N183 Chronic kidney disease, stage 3 (moderate): Secondary | ICD-10-CM | POA: Diagnosis not present

## 2018-05-18 DIAGNOSIS — S12600D Unspecified displaced fracture of seventh cervical vertebra, subsequent encounter for fracture with routine healing: Secondary | ICD-10-CM | POA: Diagnosis not present

## 2018-05-18 DIAGNOSIS — L89152 Pressure ulcer of sacral region, stage 2: Secondary | ICD-10-CM | POA: Diagnosis not present

## 2018-05-19 DIAGNOSIS — E1122 Type 2 diabetes mellitus with diabetic chronic kidney disease: Secondary | ICD-10-CM | POA: Diagnosis not present

## 2018-05-19 DIAGNOSIS — L89152 Pressure ulcer of sacral region, stage 2: Secondary | ICD-10-CM | POA: Diagnosis not present

## 2018-05-19 DIAGNOSIS — S12600D Unspecified displaced fracture of seventh cervical vertebra, subsequent encounter for fracture with routine healing: Secondary | ICD-10-CM | POA: Diagnosis not present

## 2018-05-19 DIAGNOSIS — E1142 Type 2 diabetes mellitus with diabetic polyneuropathy: Secondary | ICD-10-CM | POA: Diagnosis not present

## 2018-05-19 DIAGNOSIS — I129 Hypertensive chronic kidney disease with stage 1 through stage 4 chronic kidney disease, or unspecified chronic kidney disease: Secondary | ICD-10-CM | POA: Diagnosis not present

## 2018-05-19 DIAGNOSIS — N183 Chronic kidney disease, stage 3 (moderate): Secondary | ICD-10-CM | POA: Diagnosis not present

## 2018-05-20 DIAGNOSIS — S12600D Unspecified displaced fracture of seventh cervical vertebra, subsequent encounter for fracture with routine healing: Secondary | ICD-10-CM | POA: Diagnosis not present

## 2018-05-20 DIAGNOSIS — I129 Hypertensive chronic kidney disease with stage 1 through stage 4 chronic kidney disease, or unspecified chronic kidney disease: Secondary | ICD-10-CM | POA: Diagnosis not present

## 2018-05-20 DIAGNOSIS — L89152 Pressure ulcer of sacral region, stage 2: Secondary | ICD-10-CM | POA: Diagnosis not present

## 2018-05-20 DIAGNOSIS — E1122 Type 2 diabetes mellitus with diabetic chronic kidney disease: Secondary | ICD-10-CM | POA: Diagnosis not present

## 2018-05-20 DIAGNOSIS — N183 Chronic kidney disease, stage 3 (moderate): Secondary | ICD-10-CM | POA: Diagnosis not present

## 2018-05-20 DIAGNOSIS — E1142 Type 2 diabetes mellitus with diabetic polyneuropathy: Secondary | ICD-10-CM | POA: Diagnosis not present

## 2018-05-21 DIAGNOSIS — E1122 Type 2 diabetes mellitus with diabetic chronic kidney disease: Secondary | ICD-10-CM | POA: Diagnosis not present

## 2018-05-21 DIAGNOSIS — S12600D Unspecified displaced fracture of seventh cervical vertebra, subsequent encounter for fracture with routine healing: Secondary | ICD-10-CM | POA: Diagnosis not present

## 2018-05-21 DIAGNOSIS — L89152 Pressure ulcer of sacral region, stage 2: Secondary | ICD-10-CM | POA: Diagnosis not present

## 2018-05-21 DIAGNOSIS — I129 Hypertensive chronic kidney disease with stage 1 through stage 4 chronic kidney disease, or unspecified chronic kidney disease: Secondary | ICD-10-CM | POA: Diagnosis not present

## 2018-05-21 DIAGNOSIS — E1142 Type 2 diabetes mellitus with diabetic polyneuropathy: Secondary | ICD-10-CM | POA: Diagnosis not present

## 2018-05-21 DIAGNOSIS — N183 Chronic kidney disease, stage 3 (moderate): Secondary | ICD-10-CM | POA: Diagnosis not present

## 2018-05-22 DIAGNOSIS — L89152 Pressure ulcer of sacral region, stage 2: Secondary | ICD-10-CM | POA: Diagnosis not present

## 2018-05-22 DIAGNOSIS — I96 Gangrene, not elsewhere classified: Secondary | ICD-10-CM | POA: Diagnosis not present

## 2018-05-22 DIAGNOSIS — L89622 Pressure ulcer of left heel, stage 2: Secondary | ICD-10-CM | POA: Diagnosis not present

## 2018-05-22 DIAGNOSIS — E1152 Type 2 diabetes mellitus with diabetic peripheral angiopathy with gangrene: Secondary | ICD-10-CM | POA: Diagnosis not present

## 2018-05-22 DIAGNOSIS — I10 Essential (primary) hypertension: Secondary | ICD-10-CM | POA: Diagnosis not present

## 2018-05-22 DIAGNOSIS — E119 Type 2 diabetes mellitus without complications: Secondary | ICD-10-CM | POA: Diagnosis not present

## 2018-05-23 DIAGNOSIS — S12600D Unspecified displaced fracture of seventh cervical vertebra, subsequent encounter for fracture with routine healing: Secondary | ICD-10-CM | POA: Diagnosis not present

## 2018-05-23 DIAGNOSIS — L89152 Pressure ulcer of sacral region, stage 2: Secondary | ICD-10-CM | POA: Diagnosis not present

## 2018-05-23 DIAGNOSIS — E1122 Type 2 diabetes mellitus with diabetic chronic kidney disease: Secondary | ICD-10-CM | POA: Diagnosis not present

## 2018-05-23 DIAGNOSIS — E1142 Type 2 diabetes mellitus with diabetic polyneuropathy: Secondary | ICD-10-CM | POA: Diagnosis not present

## 2018-05-23 DIAGNOSIS — N183 Chronic kidney disease, stage 3 (moderate): Secondary | ICD-10-CM | POA: Diagnosis not present

## 2018-05-23 DIAGNOSIS — I129 Hypertensive chronic kidney disease with stage 1 through stage 4 chronic kidney disease, or unspecified chronic kidney disease: Secondary | ICD-10-CM | POA: Diagnosis not present

## 2018-05-24 DIAGNOSIS — N3001 Acute cystitis with hematuria: Secondary | ICD-10-CM | POA: Diagnosis not present

## 2018-05-24 DIAGNOSIS — L89624 Pressure ulcer of left heel, stage 4: Secondary | ICD-10-CM | POA: Diagnosis not present

## 2018-05-24 DIAGNOSIS — N183 Chronic kidney disease, stage 3 (moderate): Secondary | ICD-10-CM | POA: Diagnosis not present

## 2018-05-24 DIAGNOSIS — E782 Mixed hyperlipidemia: Secondary | ICD-10-CM | POA: Diagnosis not present

## 2018-05-24 DIAGNOSIS — Z0000 Encounter for general adult medical examination without abnormal findings: Secondary | ICD-10-CM | POA: Diagnosis not present

## 2018-05-24 DIAGNOSIS — E1140 Type 2 diabetes mellitus with diabetic neuropathy, unspecified: Secondary | ICD-10-CM | POA: Diagnosis not present

## 2018-05-24 DIAGNOSIS — L89154 Pressure ulcer of sacral region, stage 4: Secondary | ICD-10-CM | POA: Insufficient documentation

## 2018-05-24 DIAGNOSIS — E1165 Type 2 diabetes mellitus with hyperglycemia: Secondary | ICD-10-CM | POA: Diagnosis not present

## 2018-05-24 HISTORY — DX: Pressure ulcer of left heel, stage 4: L89.624

## 2018-05-25 DIAGNOSIS — N183 Chronic kidney disease, stage 3 (moderate): Secondary | ICD-10-CM | POA: Diagnosis not present

## 2018-05-25 DIAGNOSIS — E1122 Type 2 diabetes mellitus with diabetic chronic kidney disease: Secondary | ICD-10-CM | POA: Diagnosis not present

## 2018-05-25 DIAGNOSIS — I129 Hypertensive chronic kidney disease with stage 1 through stage 4 chronic kidney disease, or unspecified chronic kidney disease: Secondary | ICD-10-CM | POA: Diagnosis not present

## 2018-05-25 DIAGNOSIS — L89152 Pressure ulcer of sacral region, stage 2: Secondary | ICD-10-CM | POA: Diagnosis not present

## 2018-05-25 DIAGNOSIS — E1142 Type 2 diabetes mellitus with diabetic polyneuropathy: Secondary | ICD-10-CM | POA: Diagnosis not present

## 2018-05-25 DIAGNOSIS — S12600D Unspecified displaced fracture of seventh cervical vertebra, subsequent encounter for fracture with routine healing: Secondary | ICD-10-CM | POA: Diagnosis not present

## 2018-05-26 DIAGNOSIS — I129 Hypertensive chronic kidney disease with stage 1 through stage 4 chronic kidney disease, or unspecified chronic kidney disease: Secondary | ICD-10-CM | POA: Diagnosis not present

## 2018-05-26 DIAGNOSIS — L89152 Pressure ulcer of sacral region, stage 2: Secondary | ICD-10-CM | POA: Diagnosis not present

## 2018-05-26 DIAGNOSIS — N183 Chronic kidney disease, stage 3 (moderate): Secondary | ICD-10-CM | POA: Diagnosis not present

## 2018-05-26 DIAGNOSIS — E1122 Type 2 diabetes mellitus with diabetic chronic kidney disease: Secondary | ICD-10-CM | POA: Diagnosis not present

## 2018-05-26 DIAGNOSIS — E1142 Type 2 diabetes mellitus with diabetic polyneuropathy: Secondary | ICD-10-CM | POA: Diagnosis not present

## 2018-05-26 DIAGNOSIS — S12600D Unspecified displaced fracture of seventh cervical vertebra, subsequent encounter for fracture with routine healing: Secondary | ICD-10-CM | POA: Diagnosis not present

## 2018-05-27 DIAGNOSIS — E1122 Type 2 diabetes mellitus with diabetic chronic kidney disease: Secondary | ICD-10-CM | POA: Diagnosis not present

## 2018-05-27 DIAGNOSIS — N183 Chronic kidney disease, stage 3 (moderate): Secondary | ICD-10-CM | POA: Diagnosis not present

## 2018-05-27 DIAGNOSIS — L89152 Pressure ulcer of sacral region, stage 2: Secondary | ICD-10-CM | POA: Diagnosis not present

## 2018-05-27 DIAGNOSIS — I129 Hypertensive chronic kidney disease with stage 1 through stage 4 chronic kidney disease, or unspecified chronic kidney disease: Secondary | ICD-10-CM | POA: Diagnosis not present

## 2018-05-27 DIAGNOSIS — E1142 Type 2 diabetes mellitus with diabetic polyneuropathy: Secondary | ICD-10-CM | POA: Diagnosis not present

## 2018-05-27 DIAGNOSIS — S12600D Unspecified displaced fracture of seventh cervical vertebra, subsequent encounter for fracture with routine healing: Secondary | ICD-10-CM | POA: Diagnosis not present

## 2018-05-28 DIAGNOSIS — S12600D Unspecified displaced fracture of seventh cervical vertebra, subsequent encounter for fracture with routine healing: Secondary | ICD-10-CM | POA: Diagnosis not present

## 2018-05-28 DIAGNOSIS — E1142 Type 2 diabetes mellitus with diabetic polyneuropathy: Secondary | ICD-10-CM | POA: Diagnosis not present

## 2018-05-28 DIAGNOSIS — I129 Hypertensive chronic kidney disease with stage 1 through stage 4 chronic kidney disease, or unspecified chronic kidney disease: Secondary | ICD-10-CM | POA: Diagnosis not present

## 2018-05-28 DIAGNOSIS — L89152 Pressure ulcer of sacral region, stage 2: Secondary | ICD-10-CM | POA: Diagnosis not present

## 2018-05-28 DIAGNOSIS — E1122 Type 2 diabetes mellitus with diabetic chronic kidney disease: Secondary | ICD-10-CM | POA: Diagnosis not present

## 2018-05-28 DIAGNOSIS — N183 Chronic kidney disease, stage 3 (moderate): Secondary | ICD-10-CM | POA: Diagnosis not present

## 2018-05-29 DIAGNOSIS — N183 Chronic kidney disease, stage 3 (moderate): Secondary | ICD-10-CM | POA: Diagnosis not present

## 2018-05-29 DIAGNOSIS — E1142 Type 2 diabetes mellitus with diabetic polyneuropathy: Secondary | ICD-10-CM | POA: Diagnosis not present

## 2018-05-29 DIAGNOSIS — I129 Hypertensive chronic kidney disease with stage 1 through stage 4 chronic kidney disease, or unspecified chronic kidney disease: Secondary | ICD-10-CM | POA: Diagnosis not present

## 2018-05-29 DIAGNOSIS — S12600D Unspecified displaced fracture of seventh cervical vertebra, subsequent encounter for fracture with routine healing: Secondary | ICD-10-CM | POA: Diagnosis not present

## 2018-05-29 DIAGNOSIS — E1122 Type 2 diabetes mellitus with diabetic chronic kidney disease: Secondary | ICD-10-CM | POA: Diagnosis not present

## 2018-05-29 DIAGNOSIS — L89152 Pressure ulcer of sacral region, stage 2: Secondary | ICD-10-CM | POA: Diagnosis not present

## 2018-05-30 DIAGNOSIS — L97429 Non-pressure chronic ulcer of left heel and midfoot with unspecified severity: Secondary | ICD-10-CM | POA: Diagnosis not present

## 2018-05-30 DIAGNOSIS — K8689 Other specified diseases of pancreas: Secondary | ICD-10-CM | POA: Diagnosis not present

## 2018-05-30 DIAGNOSIS — E1122 Type 2 diabetes mellitus with diabetic chronic kidney disease: Secondary | ICD-10-CM | POA: Diagnosis not present

## 2018-05-30 DIAGNOSIS — I129 Hypertensive chronic kidney disease with stage 1 through stage 4 chronic kidney disease, or unspecified chronic kidney disease: Secondary | ICD-10-CM | POA: Diagnosis not present

## 2018-05-30 DIAGNOSIS — L89152 Pressure ulcer of sacral region, stage 2: Secondary | ICD-10-CM | POA: Diagnosis not present

## 2018-05-30 DIAGNOSIS — N183 Chronic kidney disease, stage 3 (moderate): Secondary | ICD-10-CM | POA: Diagnosis not present

## 2018-05-30 DIAGNOSIS — S12600D Unspecified displaced fracture of seventh cervical vertebra, subsequent encounter for fracture with routine healing: Secondary | ICD-10-CM | POA: Diagnosis not present

## 2018-05-30 DIAGNOSIS — S91302A Unspecified open wound, left foot, initial encounter: Secondary | ICD-10-CM | POA: Diagnosis not present

## 2018-05-30 DIAGNOSIS — I739 Peripheral vascular disease, unspecified: Secondary | ICD-10-CM | POA: Diagnosis not present

## 2018-05-30 DIAGNOSIS — E1142 Type 2 diabetes mellitus with diabetic polyneuropathy: Secondary | ICD-10-CM | POA: Diagnosis not present

## 2018-05-31 ENCOUNTER — Ambulatory Visit: Payer: Medicare Other | Admitting: Sports Medicine

## 2018-05-31 DIAGNOSIS — S12600D Unspecified displaced fracture of seventh cervical vertebra, subsequent encounter for fracture with routine healing: Secondary | ICD-10-CM | POA: Diagnosis not present

## 2018-05-31 DIAGNOSIS — N183 Chronic kidney disease, stage 3 (moderate): Secondary | ICD-10-CM | POA: Diagnosis not present

## 2018-05-31 DIAGNOSIS — I129 Hypertensive chronic kidney disease with stage 1 through stage 4 chronic kidney disease, or unspecified chronic kidney disease: Secondary | ICD-10-CM | POA: Diagnosis not present

## 2018-05-31 DIAGNOSIS — E1122 Type 2 diabetes mellitus with diabetic chronic kidney disease: Secondary | ICD-10-CM | POA: Diagnosis not present

## 2018-05-31 DIAGNOSIS — L89152 Pressure ulcer of sacral region, stage 2: Secondary | ICD-10-CM | POA: Diagnosis not present

## 2018-05-31 DIAGNOSIS — E1142 Type 2 diabetes mellitus with diabetic polyneuropathy: Secondary | ICD-10-CM | POA: Diagnosis not present

## 2018-06-01 DIAGNOSIS — L89152 Pressure ulcer of sacral region, stage 2: Secondary | ICD-10-CM | POA: Diagnosis not present

## 2018-06-01 DIAGNOSIS — N183 Chronic kidney disease, stage 3 (moderate): Secondary | ICD-10-CM | POA: Diagnosis not present

## 2018-06-01 DIAGNOSIS — I129 Hypertensive chronic kidney disease with stage 1 through stage 4 chronic kidney disease, or unspecified chronic kidney disease: Secondary | ICD-10-CM | POA: Diagnosis not present

## 2018-06-01 DIAGNOSIS — E1142 Type 2 diabetes mellitus with diabetic polyneuropathy: Secondary | ICD-10-CM | POA: Diagnosis not present

## 2018-06-01 DIAGNOSIS — E1122 Type 2 diabetes mellitus with diabetic chronic kidney disease: Secondary | ICD-10-CM | POA: Diagnosis not present

## 2018-06-01 DIAGNOSIS — S12600D Unspecified displaced fracture of seventh cervical vertebra, subsequent encounter for fracture with routine healing: Secondary | ICD-10-CM | POA: Diagnosis not present

## 2018-06-02 DIAGNOSIS — E1122 Type 2 diabetes mellitus with diabetic chronic kidney disease: Secondary | ICD-10-CM | POA: Diagnosis not present

## 2018-06-02 DIAGNOSIS — S12600D Unspecified displaced fracture of seventh cervical vertebra, subsequent encounter for fracture with routine healing: Secondary | ICD-10-CM | POA: Diagnosis not present

## 2018-06-02 DIAGNOSIS — N183 Chronic kidney disease, stage 3 (moderate): Secondary | ICD-10-CM | POA: Diagnosis not present

## 2018-06-02 DIAGNOSIS — L89152 Pressure ulcer of sacral region, stage 2: Secondary | ICD-10-CM | POA: Diagnosis not present

## 2018-06-02 DIAGNOSIS — I129 Hypertensive chronic kidney disease with stage 1 through stage 4 chronic kidney disease, or unspecified chronic kidney disease: Secondary | ICD-10-CM | POA: Diagnosis not present

## 2018-06-02 DIAGNOSIS — E1142 Type 2 diabetes mellitus with diabetic polyneuropathy: Secondary | ICD-10-CM | POA: Diagnosis not present

## 2018-06-03 DIAGNOSIS — N183 Chronic kidney disease, stage 3 (moderate): Secondary | ICD-10-CM | POA: Diagnosis not present

## 2018-06-03 DIAGNOSIS — S12600D Unspecified displaced fracture of seventh cervical vertebra, subsequent encounter for fracture with routine healing: Secondary | ICD-10-CM | POA: Diagnosis not present

## 2018-06-03 DIAGNOSIS — I129 Hypertensive chronic kidney disease with stage 1 through stage 4 chronic kidney disease, or unspecified chronic kidney disease: Secondary | ICD-10-CM | POA: Diagnosis not present

## 2018-06-03 DIAGNOSIS — E1122 Type 2 diabetes mellitus with diabetic chronic kidney disease: Secondary | ICD-10-CM | POA: Diagnosis not present

## 2018-06-03 DIAGNOSIS — E1142 Type 2 diabetes mellitus with diabetic polyneuropathy: Secondary | ICD-10-CM | POA: Diagnosis not present

## 2018-06-03 DIAGNOSIS — L89152 Pressure ulcer of sacral region, stage 2: Secondary | ICD-10-CM | POA: Diagnosis not present

## 2018-06-04 DIAGNOSIS — E1122 Type 2 diabetes mellitus with diabetic chronic kidney disease: Secondary | ICD-10-CM | POA: Diagnosis not present

## 2018-06-04 DIAGNOSIS — S12600D Unspecified displaced fracture of seventh cervical vertebra, subsequent encounter for fracture with routine healing: Secondary | ICD-10-CM | POA: Diagnosis not present

## 2018-06-04 DIAGNOSIS — N183 Chronic kidney disease, stage 3 (moderate): Secondary | ICD-10-CM | POA: Diagnosis not present

## 2018-06-04 DIAGNOSIS — E1142 Type 2 diabetes mellitus with diabetic polyneuropathy: Secondary | ICD-10-CM | POA: Diagnosis not present

## 2018-06-04 DIAGNOSIS — L89152 Pressure ulcer of sacral region, stage 2: Secondary | ICD-10-CM | POA: Diagnosis not present

## 2018-06-04 DIAGNOSIS — I129 Hypertensive chronic kidney disease with stage 1 through stage 4 chronic kidney disease, or unspecified chronic kidney disease: Secondary | ICD-10-CM | POA: Diagnosis not present

## 2018-06-05 DIAGNOSIS — M069 Rheumatoid arthritis, unspecified: Secondary | ICD-10-CM | POA: Diagnosis not present

## 2018-06-05 DIAGNOSIS — L89159 Pressure ulcer of sacral region, unspecified stage: Secondary | ICD-10-CM | POA: Diagnosis not present

## 2018-06-05 DIAGNOSIS — E1122 Type 2 diabetes mellitus with diabetic chronic kidney disease: Secondary | ICD-10-CM | POA: Diagnosis not present

## 2018-06-05 DIAGNOSIS — N183 Chronic kidney disease, stage 3 (moderate): Secondary | ICD-10-CM | POA: Diagnosis not present

## 2018-06-05 DIAGNOSIS — Z79899 Other long term (current) drug therapy: Secondary | ICD-10-CM | POA: Diagnosis not present

## 2018-06-05 DIAGNOSIS — Z794 Long term (current) use of insulin: Secondary | ICD-10-CM | POA: Diagnosis not present

## 2018-06-05 DIAGNOSIS — E785 Hyperlipidemia, unspecified: Secondary | ICD-10-CM | POA: Diagnosis not present

## 2018-06-05 DIAGNOSIS — L89629 Pressure ulcer of left heel, unspecified stage: Secondary | ICD-10-CM | POA: Diagnosis not present

## 2018-06-05 DIAGNOSIS — E119 Type 2 diabetes mellitus without complications: Secondary | ICD-10-CM | POA: Diagnosis not present

## 2018-06-05 DIAGNOSIS — E039 Hypothyroidism, unspecified: Secondary | ICD-10-CM | POA: Diagnosis not present

## 2018-06-05 DIAGNOSIS — M3500 Sicca syndrome, unspecified: Secondary | ICD-10-CM | POA: Diagnosis not present

## 2018-06-05 DIAGNOSIS — I129 Hypertensive chronic kidney disease with stage 1 through stage 4 chronic kidney disease, or unspecified chronic kidney disease: Secondary | ICD-10-CM | POA: Diagnosis not present

## 2018-06-05 DIAGNOSIS — L89154 Pressure ulcer of sacral region, stage 4: Secondary | ICD-10-CM | POA: Diagnosis not present

## 2018-06-05 DIAGNOSIS — L89624 Pressure ulcer of left heel, stage 4: Secondary | ICD-10-CM | POA: Diagnosis not present

## 2018-06-05 DIAGNOSIS — Z7982 Long term (current) use of aspirin: Secondary | ICD-10-CM | POA: Diagnosis not present

## 2018-06-05 DIAGNOSIS — Z7952 Long term (current) use of systemic steroids: Secondary | ICD-10-CM | POA: Diagnosis not present

## 2018-06-05 DIAGNOSIS — E1140 Type 2 diabetes mellitus with diabetic neuropathy, unspecified: Secondary | ICD-10-CM | POA: Diagnosis not present

## 2018-06-05 DIAGNOSIS — Z8673 Personal history of transient ischemic attack (TIA), and cerebral infarction without residual deficits: Secondary | ICD-10-CM | POA: Diagnosis not present

## 2018-06-06 DIAGNOSIS — I129 Hypertensive chronic kidney disease with stage 1 through stage 4 chronic kidney disease, or unspecified chronic kidney disease: Secondary | ICD-10-CM | POA: Diagnosis not present

## 2018-06-06 DIAGNOSIS — E1142 Type 2 diabetes mellitus with diabetic polyneuropathy: Secondary | ICD-10-CM | POA: Diagnosis not present

## 2018-06-06 DIAGNOSIS — L89152 Pressure ulcer of sacral region, stage 2: Secondary | ICD-10-CM | POA: Diagnosis not present

## 2018-06-06 DIAGNOSIS — S12600D Unspecified displaced fracture of seventh cervical vertebra, subsequent encounter for fracture with routine healing: Secondary | ICD-10-CM | POA: Diagnosis not present

## 2018-06-06 DIAGNOSIS — N183 Chronic kidney disease, stage 3 (moderate): Secondary | ICD-10-CM | POA: Diagnosis not present

## 2018-06-06 DIAGNOSIS — E1122 Type 2 diabetes mellitus with diabetic chronic kidney disease: Secondary | ICD-10-CM | POA: Diagnosis not present

## 2018-06-08 DIAGNOSIS — N183 Chronic kidney disease, stage 3 (moderate): Secondary | ICD-10-CM | POA: Diagnosis not present

## 2018-06-08 DIAGNOSIS — E1122 Type 2 diabetes mellitus with diabetic chronic kidney disease: Secondary | ICD-10-CM | POA: Diagnosis not present

## 2018-06-08 DIAGNOSIS — E1142 Type 2 diabetes mellitus with diabetic polyneuropathy: Secondary | ICD-10-CM | POA: Diagnosis not present

## 2018-06-08 DIAGNOSIS — I129 Hypertensive chronic kidney disease with stage 1 through stage 4 chronic kidney disease, or unspecified chronic kidney disease: Secondary | ICD-10-CM | POA: Diagnosis not present

## 2018-06-08 DIAGNOSIS — L89152 Pressure ulcer of sacral region, stage 2: Secondary | ICD-10-CM | POA: Diagnosis not present

## 2018-06-08 DIAGNOSIS — S12600D Unspecified displaced fracture of seventh cervical vertebra, subsequent encounter for fracture with routine healing: Secondary | ICD-10-CM | POA: Diagnosis not present

## 2018-06-10 DIAGNOSIS — E1142 Type 2 diabetes mellitus with diabetic polyneuropathy: Secondary | ICD-10-CM | POA: Diagnosis not present

## 2018-06-10 DIAGNOSIS — N183 Chronic kidney disease, stage 3 (moderate): Secondary | ICD-10-CM | POA: Diagnosis not present

## 2018-06-10 DIAGNOSIS — L89152 Pressure ulcer of sacral region, stage 2: Secondary | ICD-10-CM | POA: Diagnosis not present

## 2018-06-10 DIAGNOSIS — S12600D Unspecified displaced fracture of seventh cervical vertebra, subsequent encounter for fracture with routine healing: Secondary | ICD-10-CM | POA: Diagnosis not present

## 2018-06-10 DIAGNOSIS — E1122 Type 2 diabetes mellitus with diabetic chronic kidney disease: Secondary | ICD-10-CM | POA: Diagnosis not present

## 2018-06-10 DIAGNOSIS — I129 Hypertensive chronic kidney disease with stage 1 through stage 4 chronic kidney disease, or unspecified chronic kidney disease: Secondary | ICD-10-CM | POA: Diagnosis not present

## 2018-06-12 DIAGNOSIS — S12600D Unspecified displaced fracture of seventh cervical vertebra, subsequent encounter for fracture with routine healing: Secondary | ICD-10-CM | POA: Diagnosis not present

## 2018-06-12 DIAGNOSIS — I129 Hypertensive chronic kidney disease with stage 1 through stage 4 chronic kidney disease, or unspecified chronic kidney disease: Secondary | ICD-10-CM | POA: Diagnosis not present

## 2018-06-12 DIAGNOSIS — L89152 Pressure ulcer of sacral region, stage 2: Secondary | ICD-10-CM | POA: Diagnosis not present

## 2018-06-12 DIAGNOSIS — E1142 Type 2 diabetes mellitus with diabetic polyneuropathy: Secondary | ICD-10-CM | POA: Diagnosis not present

## 2018-06-12 DIAGNOSIS — E1122 Type 2 diabetes mellitus with diabetic chronic kidney disease: Secondary | ICD-10-CM | POA: Diagnosis not present

## 2018-06-12 DIAGNOSIS — N183 Chronic kidney disease, stage 3 (moderate): Secondary | ICD-10-CM | POA: Diagnosis not present

## 2018-06-14 DIAGNOSIS — E1142 Type 2 diabetes mellitus with diabetic polyneuropathy: Secondary | ICD-10-CM | POA: Diagnosis not present

## 2018-06-14 DIAGNOSIS — N183 Chronic kidney disease, stage 3 (moderate): Secondary | ICD-10-CM | POA: Diagnosis not present

## 2018-06-14 DIAGNOSIS — S12600D Unspecified displaced fracture of seventh cervical vertebra, subsequent encounter for fracture with routine healing: Secondary | ICD-10-CM | POA: Diagnosis not present

## 2018-06-14 DIAGNOSIS — I129 Hypertensive chronic kidney disease with stage 1 through stage 4 chronic kidney disease, or unspecified chronic kidney disease: Secondary | ICD-10-CM | POA: Diagnosis not present

## 2018-06-14 DIAGNOSIS — E1122 Type 2 diabetes mellitus with diabetic chronic kidney disease: Secondary | ICD-10-CM | POA: Diagnosis not present

## 2018-06-14 DIAGNOSIS — L89152 Pressure ulcer of sacral region, stage 2: Secondary | ICD-10-CM | POA: Diagnosis not present

## 2018-06-15 DIAGNOSIS — Z09 Encounter for follow-up examination after completed treatment for conditions other than malignant neoplasm: Secondary | ICD-10-CM | POA: Insufficient documentation

## 2018-06-16 DIAGNOSIS — N183 Chronic kidney disease, stage 3 (moderate): Secondary | ICD-10-CM | POA: Diagnosis not present

## 2018-06-16 DIAGNOSIS — I129 Hypertensive chronic kidney disease with stage 1 through stage 4 chronic kidney disease, or unspecified chronic kidney disease: Secondary | ICD-10-CM | POA: Diagnosis not present

## 2018-06-16 DIAGNOSIS — E1122 Type 2 diabetes mellitus with diabetic chronic kidney disease: Secondary | ICD-10-CM | POA: Diagnosis not present

## 2018-06-16 DIAGNOSIS — E1142 Type 2 diabetes mellitus with diabetic polyneuropathy: Secondary | ICD-10-CM | POA: Diagnosis not present

## 2018-06-16 DIAGNOSIS — L89152 Pressure ulcer of sacral region, stage 2: Secondary | ICD-10-CM | POA: Diagnosis not present

## 2018-06-16 DIAGNOSIS — S12600D Unspecified displaced fracture of seventh cervical vertebra, subsequent encounter for fracture with routine healing: Secondary | ICD-10-CM | POA: Diagnosis not present

## 2018-06-18 DIAGNOSIS — I129 Hypertensive chronic kidney disease with stage 1 through stage 4 chronic kidney disease, or unspecified chronic kidney disease: Secondary | ICD-10-CM | POA: Diagnosis not present

## 2018-06-18 DIAGNOSIS — E1142 Type 2 diabetes mellitus with diabetic polyneuropathy: Secondary | ICD-10-CM | POA: Diagnosis not present

## 2018-06-18 DIAGNOSIS — L89152 Pressure ulcer of sacral region, stage 2: Secondary | ICD-10-CM | POA: Diagnosis not present

## 2018-06-18 DIAGNOSIS — E1122 Type 2 diabetes mellitus with diabetic chronic kidney disease: Secondary | ICD-10-CM | POA: Diagnosis not present

## 2018-06-18 DIAGNOSIS — S12600D Unspecified displaced fracture of seventh cervical vertebra, subsequent encounter for fracture with routine healing: Secondary | ICD-10-CM | POA: Diagnosis not present

## 2018-06-18 DIAGNOSIS — N183 Chronic kidney disease, stage 3 (moderate): Secondary | ICD-10-CM | POA: Diagnosis not present

## 2018-06-19 DIAGNOSIS — L89624 Pressure ulcer of left heel, stage 4: Secondary | ICD-10-CM | POA: Diagnosis not present

## 2018-06-20 DIAGNOSIS — N183 Chronic kidney disease, stage 3 (moderate): Secondary | ICD-10-CM | POA: Diagnosis not present

## 2018-06-20 DIAGNOSIS — E1142 Type 2 diabetes mellitus with diabetic polyneuropathy: Secondary | ICD-10-CM | POA: Diagnosis not present

## 2018-06-20 DIAGNOSIS — L89152 Pressure ulcer of sacral region, stage 2: Secondary | ICD-10-CM | POA: Diagnosis not present

## 2018-06-20 DIAGNOSIS — E1122 Type 2 diabetes mellitus with diabetic chronic kidney disease: Secondary | ICD-10-CM | POA: Diagnosis not present

## 2018-06-20 DIAGNOSIS — S12600D Unspecified displaced fracture of seventh cervical vertebra, subsequent encounter for fracture with routine healing: Secondary | ICD-10-CM | POA: Diagnosis not present

## 2018-06-20 DIAGNOSIS — I129 Hypertensive chronic kidney disease with stage 1 through stage 4 chronic kidney disease, or unspecified chronic kidney disease: Secondary | ICD-10-CM | POA: Diagnosis not present

## 2018-06-22 DIAGNOSIS — E1122 Type 2 diabetes mellitus with diabetic chronic kidney disease: Secondary | ICD-10-CM | POA: Diagnosis not present

## 2018-06-22 DIAGNOSIS — S12600D Unspecified displaced fracture of seventh cervical vertebra, subsequent encounter for fracture with routine healing: Secondary | ICD-10-CM | POA: Diagnosis not present

## 2018-06-22 DIAGNOSIS — N183 Chronic kidney disease, stage 3 (moderate): Secondary | ICD-10-CM | POA: Diagnosis not present

## 2018-06-22 DIAGNOSIS — I129 Hypertensive chronic kidney disease with stage 1 through stage 4 chronic kidney disease, or unspecified chronic kidney disease: Secondary | ICD-10-CM | POA: Diagnosis not present

## 2018-06-22 DIAGNOSIS — L89152 Pressure ulcer of sacral region, stage 2: Secondary | ICD-10-CM | POA: Diagnosis not present

## 2018-06-22 DIAGNOSIS — E1142 Type 2 diabetes mellitus with diabetic polyneuropathy: Secondary | ICD-10-CM | POA: Diagnosis not present

## 2018-06-24 DIAGNOSIS — N183 Chronic kidney disease, stage 3 (moderate): Secondary | ICD-10-CM | POA: Diagnosis not present

## 2018-06-24 DIAGNOSIS — I129 Hypertensive chronic kidney disease with stage 1 through stage 4 chronic kidney disease, or unspecified chronic kidney disease: Secondary | ICD-10-CM | POA: Diagnosis not present

## 2018-06-24 DIAGNOSIS — S12600D Unspecified displaced fracture of seventh cervical vertebra, subsequent encounter for fracture with routine healing: Secondary | ICD-10-CM | POA: Diagnosis not present

## 2018-06-24 DIAGNOSIS — E1122 Type 2 diabetes mellitus with diabetic chronic kidney disease: Secondary | ICD-10-CM | POA: Diagnosis not present

## 2018-06-24 DIAGNOSIS — L89152 Pressure ulcer of sacral region, stage 2: Secondary | ICD-10-CM | POA: Diagnosis not present

## 2018-06-24 DIAGNOSIS — E1142 Type 2 diabetes mellitus with diabetic polyneuropathy: Secondary | ICD-10-CM | POA: Diagnosis not present

## 2018-06-26 DIAGNOSIS — H5213 Myopia, bilateral: Secondary | ICD-10-CM | POA: Diagnosis not present

## 2018-06-26 DIAGNOSIS — E1122 Type 2 diabetes mellitus with diabetic chronic kidney disease: Secondary | ICD-10-CM | POA: Diagnosis not present

## 2018-06-26 DIAGNOSIS — E1142 Type 2 diabetes mellitus with diabetic polyneuropathy: Secondary | ICD-10-CM | POA: Diagnosis not present

## 2018-06-26 DIAGNOSIS — E113291 Type 2 diabetes mellitus with mild nonproliferative diabetic retinopathy without macular edema, right eye: Secondary | ICD-10-CM | POA: Diagnosis not present

## 2018-06-26 DIAGNOSIS — L89152 Pressure ulcer of sacral region, stage 2: Secondary | ICD-10-CM | POA: Diagnosis not present

## 2018-06-26 DIAGNOSIS — I129 Hypertensive chronic kidney disease with stage 1 through stage 4 chronic kidney disease, or unspecified chronic kidney disease: Secondary | ICD-10-CM | POA: Diagnosis not present

## 2018-06-26 DIAGNOSIS — N183 Chronic kidney disease, stage 3 (moderate): Secondary | ICD-10-CM | POA: Diagnosis not present

## 2018-06-26 DIAGNOSIS — S12600D Unspecified displaced fracture of seventh cervical vertebra, subsequent encounter for fracture with routine healing: Secondary | ICD-10-CM | POA: Diagnosis not present

## 2018-06-28 ENCOUNTER — Ambulatory Visit (INDEPENDENT_AMBULATORY_CARE_PROVIDER_SITE_OTHER): Payer: Medicare Other | Admitting: Sports Medicine

## 2018-06-28 ENCOUNTER — Encounter: Payer: Self-pay | Admitting: Sports Medicine

## 2018-06-28 VITALS — BP 175/83 | HR 79 | Resp 16

## 2018-06-28 DIAGNOSIS — I129 Hypertensive chronic kidney disease with stage 1 through stage 4 chronic kidney disease, or unspecified chronic kidney disease: Secondary | ICD-10-CM | POA: Diagnosis not present

## 2018-06-28 DIAGNOSIS — E1142 Type 2 diabetes mellitus with diabetic polyneuropathy: Secondary | ICD-10-CM | POA: Diagnosis not present

## 2018-06-28 DIAGNOSIS — M79674 Pain in right toe(s): Secondary | ICD-10-CM

## 2018-06-28 DIAGNOSIS — M79675 Pain in left toe(s): Secondary | ICD-10-CM

## 2018-06-28 DIAGNOSIS — I739 Peripheral vascular disease, unspecified: Secondary | ICD-10-CM

## 2018-06-28 DIAGNOSIS — B351 Tinea unguium: Secondary | ICD-10-CM | POA: Diagnosis not present

## 2018-06-28 DIAGNOSIS — L97429 Non-pressure chronic ulcer of left heel and midfoot with unspecified severity: Secondary | ICD-10-CM

## 2018-06-28 DIAGNOSIS — L89152 Pressure ulcer of sacral region, stage 2: Secondary | ICD-10-CM | POA: Diagnosis not present

## 2018-06-28 DIAGNOSIS — S12600D Unspecified displaced fracture of seventh cervical vertebra, subsequent encounter for fracture with routine healing: Secondary | ICD-10-CM | POA: Diagnosis not present

## 2018-06-28 DIAGNOSIS — E1122 Type 2 diabetes mellitus with diabetic chronic kidney disease: Secondary | ICD-10-CM | POA: Diagnosis not present

## 2018-06-28 DIAGNOSIS — N183 Chronic kidney disease, stage 3 (moderate): Secondary | ICD-10-CM | POA: Diagnosis not present

## 2018-06-28 NOTE — Progress Notes (Signed)
Subjective: [name] is a 78 y.o. female patient seen in office for routine nail care. Patient reports that her wound at the back of her left heel started out as a blister and had to have surgery with Dr. Corena Pilgrim and is now at wound center for wound care since later Nov/Dec. Denies nausea, vomiting, fever, chills. FBS 153 yesterday.   Patient Active Problem List   Diagnosis Date Noted  . Occipital infarction ([place]) 06/27/2017  . PAF (paroxysmal atrial fibrillation) ([place]) 06/27/2017  . Coronary artery disease 12/08/2010  . Dyspnea 12/08/2010  . HYPERTENSION, BENIGN 04/01/2010  . RHEUMATOID ARTHRITIS 04/01/2010  . ABNORMAL CV (STRESS) TEST 04/01/2010  . LEG PAIN, RIGHT 03/12/2010   Current Outpatient Medications on File Prior to Visit  Medication Sig Dispense Refill  . aspirin 81 MG chewable tablet Chew 81 mg by mouth daily.    . B-D ULTRAFINE III SHORT PEN 31G X 8 MM MISC     . Cholecalciferol (VITAMIN D3) 2000 units TABS Take 1 tablet by mouth daily.    . cloNIDine (CATAPRES) 0.1 MG tablet Take 1 tablet by mouth Three times a day.    . diazepam (VALIUM) 5 MG tablet Take 5 mg by mouth 3 (three) times daily.      . fluconazole (DIFLUCAN) 150 MG tablet     . FREESTYLE LITE test strip     . furosemide (LASIX) 40 MG tablet Take 40 mg by mouth daily.      Marland Kitchen gabapentin (NEURONTIN) 100 MG capsule Take 100 mg by mouth 2 (two) times daily.    . insulin NPH-insulin regular (NOVOLIN 70/30) (70-30) 100 UNIT/ML injection Inject 39 units into the skin each morning and 6 units into the skin each evening.    . lansoprazole (PREVACID) 30 MG capsule Take 30 mg by mouth daily.      Marland Kitchen leflunomide (ARAVA) 10 MG tablet Take 10 mg by mouth daily.    Marland Kitchen levothyroxine (SYNTHROID, LEVOTHROID) 75 MCG tablet Take 75 mcg by mouth daily.      Marland Kitchen lidocaine (LIDODERM) 5 % UNW AND APP 1 PA TO SKIN UP TO 16 H PER DAY  6  . metFORMIN (GLUCOPHAGE) 1000 MG tablet Take 500 mg by mouth 3 (three) times daily. Take 1/2  tab twice a day    . morphine (MS CONTIN) 15 MG 12 hr tablet Take by mouth.    . Multiple Vitamin (MULTIVITAMIN) tablet Take 1 tablet by mouth daily.    . mupirocin ointment (BACTROBAN) 2 % To right toe ulcer 30 g 0  . oxyCODONE (OXY IR/ROXICODONE) 5 MG immediate release tablet Take 5 mg by mouth 3 (three) times daily.     Marland Kitchen oxyCODONE (OXYCONTIN) 40 MG 12 hr tablet Take 40 mg by mouth every 8 (eight) hours.      . potassium chloride (KLOR-CON 10) 10 MEQ CR tablet Take 10 mEq by mouth 2 (two) times daily. When taking furosemide     . predniSONE (DELTASONE) 5 MG tablet Take 5 mg by mouth 2 (two) times daily.     . rosuvastatin (CRESTOR) 5 MG tablet Take 0.5 tablets (2.5 mg total) by mouth daily. 30 tablet 3  . spironolactone (ALDACTONE) 25 MG tablet Take 25 mg by mouth 2 (two) times daily.    . Vancomycin HCl (VANCOMYCIN 2.5 MG/ML + HEPARIN 2500 UNITS/ML) 2.5 mLs by Intracatheter route.    Marland Kitchen amLODipine (NORVASC) 2.5 MG tablet Take 1 tablet (2.5 mg total) by mouth daily. [place]  tablet 3   Current Facility-Administered Medications on File Prior to Visit  Medication Dose Route Frequency Provider Last Rate Last Dose  . apixaban (ELIQUIS) tablet 5 mg  5 mg Oral BID Garvin Fila, MD       Allergies  Allergen Reactions  . Gold Anaphylaxis    Swelling of lips/tongue, throat Swelling of lips/tongue, throat  . Nsaids Other (See Comments)    Intolerance Intolerance  . Sulfa Antibiotics Nausea Only  . Sulfonamide Derivatives   . Latex Other (See Comments)    blisters  . Sulfamethoxazole Nausea Only    No results found for this or any previous visit (from the past 2160 hour(s)).  Objective: There were no vitals filed for this visit.  General: Patient is awake, alert, oriented x 3 and in no acute distress.  Dermatology: Skin is warm and dry bilateral.  To the left heel there is a full thickness ulceration with granular base that probes deep to soft tissue end of range with no active drainage or  acute signs of infection.  Nails x10 are mildly elongated and thickened consistent with onychomycosis.Minimal callus sub met 2 on left with no signs of infection.    Vascular: Dorsalis Pedis pulse = 1/4 Bilateral,  Posterior Tibial pulse = 0/4 Bilateral,  Capillary Fill Time < 5 seconds. Chronic venous skin changes.  Neurologic: Protective sensation absent to the level of the ankles bilateral using  the 5.07/10g [employer].  Musculosketal: No Pain with palpation bilateral.  No pain with compression to calves bilateral. Planus and digital deformity with rigid hallux extensus on right and valgus deformity.   No results for input(s): GRAMSTAIN, LABORGA in the last 8760 hours.  Assessment and Plan:  Problem List Items Addressed This Visit    None    Visit Diagnoses    Pain due to onychomycosis of toenails of both feet    -  Primary   Relevant Medications   fluconazole (DIFLUCAN) 150 MG tablet   PVD (peripheral vascular disease) ([place])       Diabetic polyneuropathy associated with type 2 diabetes mellitus (HCC)       Heel ulceration, left, with unspecified severity ([place])       Seeing wound center     -Patient seen and examined -Mechanically debrided nails x10 using sterile nail nipper without incident -Redressed Left heel ulcer with packing and dry dressing; Patient to continue with wound care center follow up with care of this ulceration -Dispensed new post op shoe since her current ones were old and worn -Patient to return to office for routine care in 3 months or sooner if problems arise.  Landis Martins, DPM

## 2018-06-29 DIAGNOSIS — Z86718 Personal history of other venous thrombosis and embolism: Secondary | ICD-10-CM | POA: Diagnosis not present

## 2018-06-29 DIAGNOSIS — D649 Anemia, unspecified: Secondary | ICD-10-CM | POA: Diagnosis not present

## 2018-06-29 DIAGNOSIS — T8189XA Other complications of procedures, not elsewhere classified, initial encounter: Secondary | ICD-10-CM | POA: Diagnosis not present

## 2018-06-29 DIAGNOSIS — L89623 Pressure ulcer of left heel, stage 3: Secondary | ICD-10-CM | POA: Diagnosis not present

## 2018-06-29 DIAGNOSIS — L98422 Non-pressure chronic ulcer of back with fat layer exposed: Secondary | ICD-10-CM | POA: Diagnosis not present

## 2018-06-29 DIAGNOSIS — S31000A Unspecified open wound of lower back and pelvis without penetration into retroperitoneum, initial encounter: Secondary | ICD-10-CM | POA: Diagnosis not present

## 2018-06-29 DIAGNOSIS — E1152 Type 2 diabetes mellitus with diabetic peripheral angiopathy with gangrene: Secondary | ICD-10-CM | POA: Diagnosis not present

## 2018-06-29 DIAGNOSIS — I96 Gangrene, not elsewhere classified: Secondary | ICD-10-CM | POA: Diagnosis not present

## 2018-06-29 DIAGNOSIS — I10 Essential (primary) hypertension: Secondary | ICD-10-CM | POA: Diagnosis not present

## 2018-06-29 DIAGNOSIS — Z794 Long term (current) use of insulin: Secondary | ICD-10-CM | POA: Diagnosis not present

## 2018-06-29 DIAGNOSIS — E1140 Type 2 diabetes mellitus with diabetic neuropathy, unspecified: Secondary | ICD-10-CM | POA: Diagnosis not present

## 2018-06-29 DIAGNOSIS — M069 Rheumatoid arthritis, unspecified: Secondary | ICD-10-CM | POA: Diagnosis not present

## 2018-07-01 DIAGNOSIS — R911 Solitary pulmonary nodule: Secondary | ICD-10-CM | POA: Diagnosis not present

## 2018-07-01 DIAGNOSIS — M0609 Rheumatoid arthritis without rheumatoid factor, multiple sites: Secondary | ICD-10-CM | POA: Diagnosis not present

## 2018-07-01 DIAGNOSIS — L89152 Pressure ulcer of sacral region, stage 2: Secondary | ICD-10-CM | POA: Diagnosis not present

## 2018-07-01 DIAGNOSIS — Z79891 Long term (current) use of opiate analgesic: Secondary | ICD-10-CM | POA: Diagnosis not present

## 2018-07-01 DIAGNOSIS — L89623 Pressure ulcer of left heel, stage 3: Secondary | ICD-10-CM | POA: Diagnosis not present

## 2018-07-01 DIAGNOSIS — Z602 Problems related to living alone: Secondary | ICD-10-CM | POA: Diagnosis not present

## 2018-07-01 DIAGNOSIS — Z7952 Long term (current) use of systemic steroids: Secondary | ICD-10-CM | POA: Diagnosis not present

## 2018-07-01 DIAGNOSIS — E1122 Type 2 diabetes mellitus with diabetic chronic kidney disease: Secondary | ICD-10-CM | POA: Diagnosis not present

## 2018-07-01 DIAGNOSIS — I129 Hypertensive chronic kidney disease with stage 1 through stage 4 chronic kidney disease, or unspecified chronic kidney disease: Secondary | ICD-10-CM | POA: Diagnosis not present

## 2018-07-01 DIAGNOSIS — N183 Chronic kidney disease, stage 3 (moderate): Secondary | ICD-10-CM | POA: Diagnosis not present

## 2018-07-01 DIAGNOSIS — E039 Hypothyroidism, unspecified: Secondary | ICD-10-CM | POA: Diagnosis not present

## 2018-07-01 DIAGNOSIS — G8929 Other chronic pain: Secondary | ICD-10-CM | POA: Diagnosis not present

## 2018-07-01 DIAGNOSIS — Z7982 Long term (current) use of aspirin: Secondary | ICD-10-CM | POA: Diagnosis not present

## 2018-07-01 DIAGNOSIS — E1142 Type 2 diabetes mellitus with diabetic polyneuropathy: Secondary | ICD-10-CM | POA: Diagnosis not present

## 2018-07-01 DIAGNOSIS — Z86718 Personal history of other venous thrombosis and embolism: Secondary | ICD-10-CM | POA: Diagnosis not present

## 2018-07-01 DIAGNOSIS — Z794 Long term (current) use of insulin: Secondary | ICD-10-CM | POA: Diagnosis not present

## 2018-07-03 DIAGNOSIS — E1122 Type 2 diabetes mellitus with diabetic chronic kidney disease: Secondary | ICD-10-CM | POA: Diagnosis not present

## 2018-07-03 DIAGNOSIS — Z6826 Body mass index (BMI) 26.0-26.9, adult: Secondary | ICD-10-CM | POA: Diagnosis not present

## 2018-07-03 DIAGNOSIS — L89152 Pressure ulcer of sacral region, stage 2: Secondary | ICD-10-CM | POA: Diagnosis not present

## 2018-07-03 DIAGNOSIS — I129 Hypertensive chronic kidney disease with stage 1 through stage 4 chronic kidney disease, or unspecified chronic kidney disease: Secondary | ICD-10-CM | POA: Diagnosis not present

## 2018-07-03 DIAGNOSIS — N3001 Acute cystitis with hematuria: Secondary | ICD-10-CM | POA: Diagnosis not present

## 2018-07-03 DIAGNOSIS — L89623 Pressure ulcer of left heel, stage 3: Secondary | ICD-10-CM | POA: Diagnosis not present

## 2018-07-03 DIAGNOSIS — N183 Chronic kidney disease, stage 3 (moderate): Secondary | ICD-10-CM | POA: Diagnosis not present

## 2018-07-03 DIAGNOSIS — E1140 Type 2 diabetes mellitus with diabetic neuropathy, unspecified: Secondary | ICD-10-CM | POA: Diagnosis not present

## 2018-07-03 DIAGNOSIS — E1142 Type 2 diabetes mellitus with diabetic polyneuropathy: Secondary | ICD-10-CM | POA: Diagnosis not present

## 2018-07-07 DIAGNOSIS — L89152 Pressure ulcer of sacral region, stage 2: Secondary | ICD-10-CM | POA: Diagnosis not present

## 2018-07-07 DIAGNOSIS — E1142 Type 2 diabetes mellitus with diabetic polyneuropathy: Secondary | ICD-10-CM | POA: Diagnosis not present

## 2018-07-07 DIAGNOSIS — E1122 Type 2 diabetes mellitus with diabetic chronic kidney disease: Secondary | ICD-10-CM | POA: Diagnosis not present

## 2018-07-07 DIAGNOSIS — L89623 Pressure ulcer of left heel, stage 3: Secondary | ICD-10-CM | POA: Diagnosis not present

## 2018-07-07 DIAGNOSIS — N183 Chronic kidney disease, stage 3 (moderate): Secondary | ICD-10-CM | POA: Diagnosis not present

## 2018-07-07 DIAGNOSIS — I129 Hypertensive chronic kidney disease with stage 1 through stage 4 chronic kidney disease, or unspecified chronic kidney disease: Secondary | ICD-10-CM | POA: Diagnosis not present

## 2018-07-10 DIAGNOSIS — E1142 Type 2 diabetes mellitus with diabetic polyneuropathy: Secondary | ICD-10-CM | POA: Diagnosis not present

## 2018-07-10 DIAGNOSIS — L89623 Pressure ulcer of left heel, stage 3: Secondary | ICD-10-CM | POA: Diagnosis not present

## 2018-07-10 DIAGNOSIS — L89152 Pressure ulcer of sacral region, stage 2: Secondary | ICD-10-CM | POA: Diagnosis not present

## 2018-07-10 DIAGNOSIS — N183 Chronic kidney disease, stage 3 (moderate): Secondary | ICD-10-CM | POA: Diagnosis not present

## 2018-07-10 DIAGNOSIS — I129 Hypertensive chronic kidney disease with stage 1 through stage 4 chronic kidney disease, or unspecified chronic kidney disease: Secondary | ICD-10-CM | POA: Diagnosis not present

## 2018-07-10 DIAGNOSIS — E1122 Type 2 diabetes mellitus with diabetic chronic kidney disease: Secondary | ICD-10-CM | POA: Diagnosis not present

## 2018-07-12 DIAGNOSIS — E1152 Type 2 diabetes mellitus with diabetic peripheral angiopathy with gangrene: Secondary | ICD-10-CM | POA: Diagnosis not present

## 2018-07-12 DIAGNOSIS — I96 Gangrene, not elsewhere classified: Secondary | ICD-10-CM | POA: Diagnosis not present

## 2018-07-12 DIAGNOSIS — T8189XA Other complications of procedures, not elsewhere classified, initial encounter: Secondary | ICD-10-CM | POA: Diagnosis not present

## 2018-07-12 DIAGNOSIS — L89623 Pressure ulcer of left heel, stage 3: Secondary | ICD-10-CM | POA: Diagnosis not present

## 2018-07-14 DIAGNOSIS — N183 Chronic kidney disease, stage 3 (moderate): Secondary | ICD-10-CM | POA: Diagnosis not present

## 2018-07-14 DIAGNOSIS — L89623 Pressure ulcer of left heel, stage 3: Secondary | ICD-10-CM | POA: Diagnosis not present

## 2018-07-14 DIAGNOSIS — I129 Hypertensive chronic kidney disease with stage 1 through stage 4 chronic kidney disease, or unspecified chronic kidney disease: Secondary | ICD-10-CM | POA: Diagnosis not present

## 2018-07-14 DIAGNOSIS — E1142 Type 2 diabetes mellitus with diabetic polyneuropathy: Secondary | ICD-10-CM | POA: Diagnosis not present

## 2018-07-14 DIAGNOSIS — L89152 Pressure ulcer of sacral region, stage 2: Secondary | ICD-10-CM | POA: Diagnosis not present

## 2018-07-14 DIAGNOSIS — E1122 Type 2 diabetes mellitus with diabetic chronic kidney disease: Secondary | ICD-10-CM | POA: Diagnosis not present

## 2018-07-17 DIAGNOSIS — N183 Chronic kidney disease, stage 3 (moderate): Secondary | ICD-10-CM | POA: Diagnosis not present

## 2018-07-17 DIAGNOSIS — I129 Hypertensive chronic kidney disease with stage 1 through stage 4 chronic kidney disease, or unspecified chronic kidney disease: Secondary | ICD-10-CM | POA: Diagnosis not present

## 2018-07-17 DIAGNOSIS — E1142 Type 2 diabetes mellitus with diabetic polyneuropathy: Secondary | ICD-10-CM | POA: Diagnosis not present

## 2018-07-17 DIAGNOSIS — L89152 Pressure ulcer of sacral region, stage 2: Secondary | ICD-10-CM | POA: Diagnosis not present

## 2018-07-17 DIAGNOSIS — L89623 Pressure ulcer of left heel, stage 3: Secondary | ICD-10-CM | POA: Diagnosis not present

## 2018-07-17 DIAGNOSIS — E1122 Type 2 diabetes mellitus with diabetic chronic kidney disease: Secondary | ICD-10-CM | POA: Diagnosis not present

## 2018-07-19 DIAGNOSIS — I96 Gangrene, not elsewhere classified: Secondary | ICD-10-CM | POA: Diagnosis not present

## 2018-07-19 DIAGNOSIS — L89623 Pressure ulcer of left heel, stage 3: Secondary | ICD-10-CM | POA: Diagnosis not present

## 2018-07-19 DIAGNOSIS — L89153 Pressure ulcer of sacral region, stage 3: Secondary | ICD-10-CM | POA: Diagnosis not present

## 2018-07-20 DIAGNOSIS — L89623 Pressure ulcer of left heel, stage 3: Secondary | ICD-10-CM | POA: Diagnosis not present

## 2018-07-20 DIAGNOSIS — E1122 Type 2 diabetes mellitus with diabetic chronic kidney disease: Secondary | ICD-10-CM | POA: Diagnosis not present

## 2018-07-20 DIAGNOSIS — N183 Chronic kidney disease, stage 3 (moderate): Secondary | ICD-10-CM | POA: Diagnosis not present

## 2018-07-20 DIAGNOSIS — L89152 Pressure ulcer of sacral region, stage 2: Secondary | ICD-10-CM | POA: Diagnosis not present

## 2018-07-20 DIAGNOSIS — I129 Hypertensive chronic kidney disease with stage 1 through stage 4 chronic kidney disease, or unspecified chronic kidney disease: Secondary | ICD-10-CM | POA: Diagnosis not present

## 2018-07-20 DIAGNOSIS — E1142 Type 2 diabetes mellitus with diabetic polyneuropathy: Secondary | ICD-10-CM | POA: Diagnosis not present

## 2018-07-21 DIAGNOSIS — L89152 Pressure ulcer of sacral region, stage 2: Secondary | ICD-10-CM | POA: Diagnosis not present

## 2018-07-21 DIAGNOSIS — L89623 Pressure ulcer of left heel, stage 3: Secondary | ICD-10-CM | POA: Diagnosis not present

## 2018-07-21 DIAGNOSIS — I129 Hypertensive chronic kidney disease with stage 1 through stage 4 chronic kidney disease, or unspecified chronic kidney disease: Secondary | ICD-10-CM | POA: Diagnosis not present

## 2018-07-21 DIAGNOSIS — E1142 Type 2 diabetes mellitus with diabetic polyneuropathy: Secondary | ICD-10-CM | POA: Diagnosis not present

## 2018-07-21 DIAGNOSIS — N183 Chronic kidney disease, stage 3 (moderate): Secondary | ICD-10-CM | POA: Diagnosis not present

## 2018-07-21 DIAGNOSIS — E1122 Type 2 diabetes mellitus with diabetic chronic kidney disease: Secondary | ICD-10-CM | POA: Diagnosis not present

## 2018-07-24 DIAGNOSIS — I129 Hypertensive chronic kidney disease with stage 1 through stage 4 chronic kidney disease, or unspecified chronic kidney disease: Secondary | ICD-10-CM | POA: Diagnosis not present

## 2018-07-24 DIAGNOSIS — L89623 Pressure ulcer of left heel, stage 3: Secondary | ICD-10-CM | POA: Diagnosis not present

## 2018-07-24 DIAGNOSIS — E1142 Type 2 diabetes mellitus with diabetic polyneuropathy: Secondary | ICD-10-CM | POA: Diagnosis not present

## 2018-07-24 DIAGNOSIS — N183 Chronic kidney disease, stage 3 (moderate): Secondary | ICD-10-CM | POA: Diagnosis not present

## 2018-07-24 DIAGNOSIS — E1122 Type 2 diabetes mellitus with diabetic chronic kidney disease: Secondary | ICD-10-CM | POA: Diagnosis not present

## 2018-07-24 DIAGNOSIS — L89152 Pressure ulcer of sacral region, stage 2: Secondary | ICD-10-CM | POA: Diagnosis not present

## 2018-07-26 DIAGNOSIS — L89623 Pressure ulcer of left heel, stage 3: Secondary | ICD-10-CM | POA: Diagnosis not present

## 2018-07-26 DIAGNOSIS — I129 Hypertensive chronic kidney disease with stage 1 through stage 4 chronic kidney disease, or unspecified chronic kidney disease: Secondary | ICD-10-CM | POA: Diagnosis not present

## 2018-07-26 DIAGNOSIS — L89152 Pressure ulcer of sacral region, stage 2: Secondary | ICD-10-CM | POA: Diagnosis not present

## 2018-07-26 DIAGNOSIS — E1142 Type 2 diabetes mellitus with diabetic polyneuropathy: Secondary | ICD-10-CM | POA: Diagnosis not present

## 2018-07-26 DIAGNOSIS — E1122 Type 2 diabetes mellitus with diabetic chronic kidney disease: Secondary | ICD-10-CM | POA: Diagnosis not present

## 2018-07-26 DIAGNOSIS — N183 Chronic kidney disease, stage 3 (moderate): Secondary | ICD-10-CM | POA: Diagnosis not present

## 2018-07-28 DIAGNOSIS — N183 Chronic kidney disease, stage 3 (moderate): Secondary | ICD-10-CM | POA: Diagnosis not present

## 2018-07-28 DIAGNOSIS — L89623 Pressure ulcer of left heel, stage 3: Secondary | ICD-10-CM | POA: Diagnosis not present

## 2018-07-28 DIAGNOSIS — E1122 Type 2 diabetes mellitus with diabetic chronic kidney disease: Secondary | ICD-10-CM | POA: Diagnosis not present

## 2018-07-28 DIAGNOSIS — E1142 Type 2 diabetes mellitus with diabetic polyneuropathy: Secondary | ICD-10-CM | POA: Diagnosis not present

## 2018-07-28 DIAGNOSIS — L89152 Pressure ulcer of sacral region, stage 2: Secondary | ICD-10-CM | POA: Diagnosis not present

## 2018-07-28 DIAGNOSIS — I129 Hypertensive chronic kidney disease with stage 1 through stage 4 chronic kidney disease, or unspecified chronic kidney disease: Secondary | ICD-10-CM | POA: Diagnosis not present

## 2018-07-31 DIAGNOSIS — Z7982 Long term (current) use of aspirin: Secondary | ICD-10-CM | POA: Diagnosis not present

## 2018-07-31 DIAGNOSIS — Z86718 Personal history of other venous thrombosis and embolism: Secondary | ICD-10-CM | POA: Diagnosis not present

## 2018-07-31 DIAGNOSIS — Z602 Problems related to living alone: Secondary | ICD-10-CM | POA: Diagnosis not present

## 2018-07-31 DIAGNOSIS — E1122 Type 2 diabetes mellitus with diabetic chronic kidney disease: Secondary | ICD-10-CM | POA: Diagnosis not present

## 2018-07-31 DIAGNOSIS — M0609 Rheumatoid arthritis without rheumatoid factor, multiple sites: Secondary | ICD-10-CM | POA: Diagnosis not present

## 2018-07-31 DIAGNOSIS — E039 Hypothyroidism, unspecified: Secondary | ICD-10-CM | POA: Diagnosis not present

## 2018-07-31 DIAGNOSIS — L89152 Pressure ulcer of sacral region, stage 2: Secondary | ICD-10-CM | POA: Diagnosis not present

## 2018-07-31 DIAGNOSIS — Z794 Long term (current) use of insulin: Secondary | ICD-10-CM | POA: Diagnosis not present

## 2018-07-31 DIAGNOSIS — N183 Chronic kidney disease, stage 3 (moderate): Secondary | ICD-10-CM | POA: Diagnosis not present

## 2018-07-31 DIAGNOSIS — R911 Solitary pulmonary nodule: Secondary | ICD-10-CM | POA: Diagnosis not present

## 2018-07-31 DIAGNOSIS — L89623 Pressure ulcer of left heel, stage 3: Secondary | ICD-10-CM | POA: Diagnosis not present

## 2018-07-31 DIAGNOSIS — Z79891 Long term (current) use of opiate analgesic: Secondary | ICD-10-CM | POA: Diagnosis not present

## 2018-07-31 DIAGNOSIS — I129 Hypertensive chronic kidney disease with stage 1 through stage 4 chronic kidney disease, or unspecified chronic kidney disease: Secondary | ICD-10-CM | POA: Diagnosis not present

## 2018-07-31 DIAGNOSIS — Z7952 Long term (current) use of systemic steroids: Secondary | ICD-10-CM | POA: Diagnosis not present

## 2018-07-31 DIAGNOSIS — E1142 Type 2 diabetes mellitus with diabetic polyneuropathy: Secondary | ICD-10-CM | POA: Diagnosis not present

## 2018-07-31 DIAGNOSIS — G8929 Other chronic pain: Secondary | ICD-10-CM | POA: Diagnosis not present

## 2018-08-02 DIAGNOSIS — L97509 Non-pressure chronic ulcer of other part of unspecified foot with unspecified severity: Secondary | ICD-10-CM | POA: Diagnosis not present

## 2018-08-02 DIAGNOSIS — L89623 Pressure ulcer of left heel, stage 3: Secondary | ICD-10-CM | POA: Diagnosis not present

## 2018-08-02 DIAGNOSIS — M0609 Rheumatoid arthritis without rheumatoid factor, multiple sites: Secondary | ICD-10-CM | POA: Diagnosis not present

## 2018-08-02 DIAGNOSIS — L97409 Non-pressure chronic ulcer of unspecified heel and midfoot with unspecified severity: Secondary | ICD-10-CM | POA: Diagnosis not present

## 2018-08-02 DIAGNOSIS — I129 Hypertensive chronic kidney disease with stage 1 through stage 4 chronic kidney disease, or unspecified chronic kidney disease: Secondary | ICD-10-CM | POA: Diagnosis not present

## 2018-08-02 DIAGNOSIS — N183 Chronic kidney disease, stage 3 (moderate): Secondary | ICD-10-CM | POA: Diagnosis not present

## 2018-08-02 DIAGNOSIS — E1142 Type 2 diabetes mellitus with diabetic polyneuropathy: Secondary | ICD-10-CM | POA: Diagnosis not present

## 2018-08-02 DIAGNOSIS — E1122 Type 2 diabetes mellitus with diabetic chronic kidney disease: Secondary | ICD-10-CM | POA: Diagnosis not present

## 2018-08-04 ENCOUNTER — Ambulatory Visit (INDEPENDENT_AMBULATORY_CARE_PROVIDER_SITE_OTHER): Payer: Medicare Other | Admitting: Sports Medicine

## 2018-08-04 ENCOUNTER — Telehealth: Payer: Self-pay | Admitting: *Deleted

## 2018-08-04 ENCOUNTER — Encounter: Payer: Self-pay | Admitting: Sports Medicine

## 2018-08-04 ENCOUNTER — Other Ambulatory Visit: Payer: Self-pay

## 2018-08-04 VITALS — BP 142/67 | HR 58 | Temp 98.5°F | Resp 16

## 2018-08-04 DIAGNOSIS — I739 Peripheral vascular disease, unspecified: Secondary | ICD-10-CM | POA: Diagnosis not present

## 2018-08-04 DIAGNOSIS — M0609 Rheumatoid arthritis without rheumatoid factor, multiple sites: Secondary | ICD-10-CM | POA: Diagnosis not present

## 2018-08-04 DIAGNOSIS — L89623 Pressure ulcer of left heel, stage 3: Secondary | ICD-10-CM | POA: Diagnosis not present

## 2018-08-04 DIAGNOSIS — E1142 Type 2 diabetes mellitus with diabetic polyneuropathy: Secondary | ICD-10-CM

## 2018-08-04 DIAGNOSIS — L97429 Non-pressure chronic ulcer of left heel and midfoot with unspecified severity: Secondary | ICD-10-CM

## 2018-08-04 DIAGNOSIS — L97521 Non-pressure chronic ulcer of other part of left foot limited to breakdown of skin: Secondary | ICD-10-CM

## 2018-08-04 DIAGNOSIS — I129 Hypertensive chronic kidney disease with stage 1 through stage 4 chronic kidney disease, or unspecified chronic kidney disease: Secondary | ICD-10-CM | POA: Diagnosis not present

## 2018-08-04 DIAGNOSIS — E1122 Type 2 diabetes mellitus with diabetic chronic kidney disease: Secondary | ICD-10-CM | POA: Diagnosis not present

## 2018-08-04 DIAGNOSIS — N183 Chronic kidney disease, stage 3 (moderate): Secondary | ICD-10-CM | POA: Diagnosis not present

## 2018-08-04 NOTE — Telephone Encounter (Signed)
-----   Message from Asencion Islam, [place] sent at 08/04/2018  1:06 PM EDT ----- Regarding: [name] Home health Wound care orders Apply antibiotic powder which patient has at home covered with dry dressing to plantar forefoot ulcer on the left 2-3x per week. Patient should still go to wound care center for her Left heel ulceration -Dr. Marylene Land

## 2018-08-04 NOTE — Progress Notes (Signed)
Subjective: Maria SchaumannDorothy Sellers is a 78 y.o. female patient seen in office for for evaluation of left left foot callus states that she noticed on Tuesday the callus looking dark with a small amount of drainage and was concerned that it may be getting infected denies any significant swelling warmth redness or any constitutional symptoms at this time.  Patient reports that the nurse change her dressing early this morning on her heel which seems to be improving and reports that she is still going to the wound care center for this ulceration.  FBS 173 today  Patient Active Problem List   Diagnosis Date Noted  . 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 PAF (paroxysmal atrial fibrillation) (HCC) 06/27/2017  . Coronary artery disease 12/08/2010  . Dyspnea 12/08/2010  . HYPERTENSION, BENIGN 04/01/2010  . RHEUMATOID ARTHRITIS 04/01/2010  . ABNORMAL CV (STRESS) TEST 04/01/2010  . LEG PAIN, RIGHT 03/12/2010   Current Outpatient Medications on File Prior to Visit  Medication Sig Dispense Refill  . amLODipine (NORVASC) 2.5 MG tablet Take 1 tablet (2.5 mg total) by mouth daily. 90 tablet 3  . aspirin 81 MG chewable tablet Chew 81 mg by mouth daily.    . B-D ULTRAFINE III SHORT PEN 31G X 8 MM MISC     . Cholecalciferol (VITAMIN D3) 2000 units TABS Take 1 tablet by mouth daily.    . cloNIDine (CATAPRES) 0.1 MG tablet Take 1 tablet by mouth Three times a day.    . diazepam (VALIUM) 5 MG tablet Take 5 mg by mouth 3 (three) times daily.      . fluconazole (DIFLUCAN) 150 MG tablet     . FREESTYLE LITE test strip     . furosemide (LASIX) 40 MG tablet Take 40 mg by mouth daily.      Marland Kitchen. gabapentin (NEURONTIN) 100 MG capsule Take 100 mg by mouth 2 (two) times daily.    . insulin NPH-insulin regular (NOVOLIN 70/30) (70-30) 100 UNIT/ML injection Inject 39 units into the skin each morning and 6 units into the skin each evening.    . lansoprazole (PREVACID) 30 MG capsule Take 30 mg by mouth daily.      Marland Kitchen. leflunomide  (ARAVA) 10 MG tablet Take 10 mg by mouth daily.    Marland Kitchen. levothyroxine (SYNTHROID, LEVOTHROID) 75 MCG tablet Take 75 mcg by mouth daily.      Marland Kitchen. lidocaine (LIDODERM) 5 % UNW AND APP 1 PA TO SKIN UP TO 16 H PER DAY  6  . metFORMIN (GLUCOPHAGE) 1000 MG tablet Take 500 mg by mouth 3 (three) times daily. Take 1/2 tab twice a day    . morphine (MS CONTIN) 15 MG 12 hr tablet Take by mouth.    . Multiple Vitamin (MULTIVITAMIN) tablet Take 1 tablet by mouth daily.    . mupirocin ointment (BACTROBAN) 2 % To right toe ulcer 30 g 0  . oxyCODONE (OXY IR/ROXICODONE) 5 MG immediate release tablet Take 5 mg by mouth 3 (three) times daily.     Marland Kitchen. oxyCODONE (OXYCONTIN) 40 MG 12 hr tablet Take 40 mg by mouth every 8 (eight) hours.      . potassium chloride (KLOR-CON 10) 10 MEQ CR tablet Take 10 mEq by mouth 2 (two) times daily. When taking furosemide     . predniSONE (DELTASONE) 5 MG tablet Take 5 mg by mouth 2 (two) times daily.     . rosuvastatin (CRESTOR) 5 MG tablet Take 0.5 tablets (2.5 mg total) by mouth daily. 30 tablet  3  . spironolactone (ALDACTONE) 25 MG tablet Take 25 mg by mouth 2 (two) times daily.    . Vancomycin HCl (VANCOMYCIN 2.5 MG/ML + HEPARIN 2500 UNITS/ML) 2.5 mLs by Intracatheter route.     Current Facility-Administered Medications on File Prior to Visit  Medication Dose Route Frequency Provider Last Rate Last Dose  . apixaban (ELIQUIS) tablet 5 mg  5 mg Oral BID Micki Riley, MD       Allergies  Allergen Reactions  . Gold Anaphylaxis    Swelling of lips/tongue, throat Swelling of lips/tongue, throat  . Nsaids Other (See Comments)    Intolerance Intolerance  . Sulfa Antibiotics Nausea Only  . Sulfonamide Derivatives   . Latex Other (See Comments)    blisters  . Sulfamethoxazole Nausea Only    No results found for this or any previous visit (from the past 2160 hour(s)).  Objective: There were no vitals filed for this visit.  General: Patient is awake, alert, oriented x 3 and in  no acute distress.  Dermatology: Skin is warm and dry bilateral.  To the left forefoot there is a partial thickness ulceration that measures 0.5 x 1 cm with surrounding blistered skin that extends to the sulcus of the second and third toes with mild surrounding maceration no active drainage no warmth no redness no malodor.  No other acute signs of infection.  Left heel dressing is intact and was just changes morning so left undisturbed.  Nails x10 are short and thickened consistent with onychomycosis.    Vascular: Dorsalis Pedis pulse = 1/4 Bilateral,  Posterior Tibial pulse = 0/4 Bilateral,  Capillary Fill Time < 5 seconds. Chronic venous skin changes.  Neurologic: Protective sensation absent to the level of the ankles bilateral using  the 5.07/10g [employer].  Musculosketal: No Pain with palpation ulcerated area on left foot.  No pain with compression to calves bilateral. Planus and digital deformity with rigid hallux extensus on right and valgus deformity.   No results for input(s): GRAMSTAIN, LABORGA in the last 8760 hours.  Assessment and Plan:  Problem List Items Addressed This Visit    None    Visit Diagnoses    Ulcer of left foot, limited to breakdown of skin (HCC)    -  Primary   Heel ulceration, left, with unspecified severity (HCC)       PVD (peripheral vascular disease) (HCC)       Diabetic polyneuropathy associated with type 2 diabetes mellitus (HCC)          -Patient seen and examined -Discussed finding of ulcerated callus plantar left foot  Excisionally dedbrided ulceration at left plantar forefoot to healthy bleeding borders removing nonviable tissue using a sterile chisel blade. Wound measures post debridement as above.  Wound was debrided to the level of the dermis with viable wound base exposed to promote healing. Hemostasis was achieved with manuel pressure. Patient tolerated procedure well without any discomfort or anesthesia necessary for this  wound debridement.  -Wound culture was obtained will call patient if she needs to start on oral antibiotics -Applied Betadine and dry sterile dressing and instructed patient to continue with every other day dressings at home consisting of antibiotic powder with help from [place] home health -Continue with postoperative shoes bilateral added offloading pad to plantar forefoot on left - Advised patient to go to the ER or return to office if the wound worsens or if constitutional symptoms are present. -For the left heel ulcer; Patient to continue  with wound care center follow up with care of this ulceration -Patient to return to office in 2 weeks or sooner if problems arise.  Asencion Islamitorya Jashley Yellin, DPM

## 2018-08-04 NOTE — Telephone Encounter (Signed)
Left message asking pt if she was established with a [facility] agency, to make sure she was using [facility] as stated in Dr. Wynema Birch orders, I had seen she had been established with [facility] in 2019. Faxed orders to [facility].

## 2018-08-07 DIAGNOSIS — N183 Chronic kidney disease, stage 3 (moderate): Secondary | ICD-10-CM | POA: Diagnosis not present

## 2018-08-07 DIAGNOSIS — M0609 Rheumatoid arthritis without rheumatoid factor, multiple sites: Secondary | ICD-10-CM | POA: Diagnosis not present

## 2018-08-07 DIAGNOSIS — E1122 Type 2 diabetes mellitus with diabetic chronic kidney disease: Secondary | ICD-10-CM | POA: Diagnosis not present

## 2018-08-07 DIAGNOSIS — I129 Hypertensive chronic kidney disease with stage 1 through stage 4 chronic kidney disease, or unspecified chronic kidney disease: Secondary | ICD-10-CM | POA: Diagnosis not present

## 2018-08-07 DIAGNOSIS — L89623 Pressure ulcer of left heel, stage 3: Secondary | ICD-10-CM | POA: Diagnosis not present

## 2018-08-07 DIAGNOSIS — E1142 Type 2 diabetes mellitus with diabetic polyneuropathy: Secondary | ICD-10-CM | POA: Diagnosis not present

## 2018-08-08 LAB — WOUND CULTURE: Organism ID, Bacteria: NONE SEEN

## 2018-08-09 ENCOUNTER — Telehealth: Payer: Self-pay | Admitting: *Deleted

## 2018-08-09 DIAGNOSIS — M0609 Rheumatoid arthritis without rheumatoid factor, multiple sites: Secondary | ICD-10-CM | POA: Diagnosis not present

## 2018-08-09 DIAGNOSIS — E1142 Type 2 diabetes mellitus with diabetic polyneuropathy: Secondary | ICD-10-CM | POA: Diagnosis not present

## 2018-08-09 DIAGNOSIS — E1122 Type 2 diabetes mellitus with diabetic chronic kidney disease: Secondary | ICD-10-CM | POA: Diagnosis not present

## 2018-08-09 DIAGNOSIS — N183 Chronic kidney disease, stage 3 (moderate): Secondary | ICD-10-CM | POA: Diagnosis not present

## 2018-08-09 DIAGNOSIS — L89623 Pressure ulcer of left heel, stage 3: Secondary | ICD-10-CM | POA: Diagnosis not present

## 2018-08-09 DIAGNOSIS — I129 Hypertensive chronic kidney disease with stage 1 through stage 4 chronic kidney disease, or unspecified chronic kidney disease: Secondary | ICD-10-CM | POA: Diagnosis not present

## 2018-08-09 MED ORDER — CLINDAMYCIN HCL 300 MG PO CAPS: 300.0000 mg | ORAL_CAPSULE | Freq: Three times a day (TID) | ORAL | 0 refills | Status: DC

## 2018-08-09 NOTE — Telephone Encounter (Signed)
-----   Message from Asencion Islam, [place] sent at 08/08/2018  3:36 PM EDT ----- Wound culture + please send Clindamycin 300mg  TID x 10 days Thanks Dr Kathie Rhodes

## 2018-08-09 NOTE — Telephone Encounter (Signed)
I informed pt of Dr. Stover's review of results and orders. 

## 2018-08-10 DIAGNOSIS — N183 Chronic kidney disease, stage 3 (moderate): Secondary | ICD-10-CM | POA: Diagnosis not present

## 2018-08-10 DIAGNOSIS — L89623 Pressure ulcer of left heel, stage 3: Secondary | ICD-10-CM | POA: Diagnosis not present

## 2018-08-10 DIAGNOSIS — E1122 Type 2 diabetes mellitus with diabetic chronic kidney disease: Secondary | ICD-10-CM | POA: Diagnosis not present

## 2018-08-10 DIAGNOSIS — I129 Hypertensive chronic kidney disease with stage 1 through stage 4 chronic kidney disease, or unspecified chronic kidney disease: Secondary | ICD-10-CM | POA: Diagnosis not present

## 2018-08-10 DIAGNOSIS — E1142 Type 2 diabetes mellitus with diabetic polyneuropathy: Secondary | ICD-10-CM | POA: Diagnosis not present

## 2018-08-10 DIAGNOSIS — M0609 Rheumatoid arthritis without rheumatoid factor, multiple sites: Secondary | ICD-10-CM | POA: Diagnosis not present

## 2018-08-14 DIAGNOSIS — E1122 Type 2 diabetes mellitus with diabetic chronic kidney disease: Secondary | ICD-10-CM | POA: Diagnosis not present

## 2018-08-14 DIAGNOSIS — E1142 Type 2 diabetes mellitus with diabetic polyneuropathy: Secondary | ICD-10-CM | POA: Diagnosis not present

## 2018-08-14 DIAGNOSIS — L89623 Pressure ulcer of left heel, stage 3: Secondary | ICD-10-CM | POA: Diagnosis not present

## 2018-08-14 DIAGNOSIS — M0609 Rheumatoid arthritis without rheumatoid factor, multiple sites: Secondary | ICD-10-CM | POA: Diagnosis not present

## 2018-08-14 DIAGNOSIS — N183 Chronic kidney disease, stage 3 (moderate): Secondary | ICD-10-CM | POA: Diagnosis not present

## 2018-08-14 DIAGNOSIS — I129 Hypertensive chronic kidney disease with stage 1 through stage 4 chronic kidney disease, or unspecified chronic kidney disease: Secondary | ICD-10-CM | POA: Diagnosis not present

## 2018-08-16 DIAGNOSIS — L89623 Pressure ulcer of left heel, stage 3: Secondary | ICD-10-CM | POA: Diagnosis not present

## 2018-08-16 DIAGNOSIS — E1122 Type 2 diabetes mellitus with diabetic chronic kidney disease: Secondary | ICD-10-CM | POA: Diagnosis not present

## 2018-08-16 DIAGNOSIS — E1142 Type 2 diabetes mellitus with diabetic polyneuropathy: Secondary | ICD-10-CM | POA: Diagnosis not present

## 2018-08-16 DIAGNOSIS — M0609 Rheumatoid arthritis without rheumatoid factor, multiple sites: Secondary | ICD-10-CM | POA: Diagnosis not present

## 2018-08-16 DIAGNOSIS — N183 Chronic kidney disease, stage 3 (moderate): Secondary | ICD-10-CM | POA: Diagnosis not present

## 2018-08-16 DIAGNOSIS — I129 Hypertensive chronic kidney disease with stage 1 through stage 4 chronic kidney disease, or unspecified chronic kidney disease: Secondary | ICD-10-CM | POA: Diagnosis not present

## 2018-08-17 DIAGNOSIS — R69 Illness, unspecified: Secondary | ICD-10-CM | POA: Diagnosis not present

## 2018-08-17 DIAGNOSIS — R1013 Epigastric pain: Secondary | ICD-10-CM | POA: Diagnosis not present

## 2018-08-17 DIAGNOSIS — K862 Cyst of pancreas: Secondary | ICD-10-CM | POA: Diagnosis not present

## 2018-08-17 DIAGNOSIS — K5900 Constipation, unspecified: Secondary | ICD-10-CM | POA: Diagnosis not present

## 2018-08-17 DIAGNOSIS — J90 Pleural effusion, not elsewhere classified: Secondary | ICD-10-CM | POA: Diagnosis not present

## 2018-08-17 DIAGNOSIS — N3001 Acute cystitis with hematuria: Secondary | ICD-10-CM | POA: Diagnosis not present

## 2018-08-18 ENCOUNTER — Other Ambulatory Visit: Payer: Self-pay

## 2018-08-18 ENCOUNTER — Ambulatory Visit (INDEPENDENT_AMBULATORY_CARE_PROVIDER_SITE_OTHER): Payer: Medicare Other | Admitting: Sports Medicine

## 2018-08-18 ENCOUNTER — Telehealth: Payer: Self-pay | Admitting: *Deleted

## 2018-08-18 ENCOUNTER — Encounter: Payer: Self-pay | Admitting: Sports Medicine

## 2018-08-18 VITALS — Temp 97.8°F | Resp 16

## 2018-08-18 DIAGNOSIS — L89623 Pressure ulcer of left heel, stage 3: Secondary | ICD-10-CM | POA: Diagnosis not present

## 2018-08-18 DIAGNOSIS — L97521 Non-pressure chronic ulcer of other part of left foot limited to breakdown of skin: Secondary | ICD-10-CM | POA: Diagnosis not present

## 2018-08-18 DIAGNOSIS — M0609 Rheumatoid arthritis without rheumatoid factor, multiple sites: Secondary | ICD-10-CM | POA: Diagnosis not present

## 2018-08-18 DIAGNOSIS — N183 Chronic kidney disease, stage 3 (moderate): Secondary | ICD-10-CM | POA: Diagnosis not present

## 2018-08-18 DIAGNOSIS — I129 Hypertensive chronic kidney disease with stage 1 through stage 4 chronic kidney disease, or unspecified chronic kidney disease: Secondary | ICD-10-CM | POA: Diagnosis not present

## 2018-08-18 DIAGNOSIS — I739 Peripheral vascular disease, unspecified: Secondary | ICD-10-CM

## 2018-08-18 DIAGNOSIS — E1142 Type 2 diabetes mellitus with diabetic polyneuropathy: Secondary | ICD-10-CM | POA: Diagnosis not present

## 2018-08-18 DIAGNOSIS — L97429 Non-pressure chronic ulcer of left heel and midfoot with unspecified severity: Secondary | ICD-10-CM

## 2018-08-18 DIAGNOSIS — E1122 Type 2 diabetes mellitus with diabetic chronic kidney disease: Secondary | ICD-10-CM | POA: Diagnosis not present

## 2018-08-18 NOTE — Telephone Encounter (Signed)
Faxed Dr, Wynema Birch 08/18/2018 2:17pm orders to [facility].
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
## 2018-08-18 NOTE — Telephone Encounter (Signed)
-----   Message from [place], [place] sent at 08/18/2018  2:17 PM EDT ----- Regarding: [facility] home [facility] nursing orders Apply a small amount of antibiotic powder to gauze and applied to wound at the plantar forefoot and may also use at her left heel covered with gauze and paper tape or a Band-Aid/Allevyn dressing every other day until areas are completely healed.

## 2018-08-21 DIAGNOSIS — L89623 Pressure ulcer of left heel, stage 3: Secondary | ICD-10-CM | POA: Diagnosis not present

## 2018-08-21 DIAGNOSIS — N183 Chronic kidney disease, stage 3 (moderate): Secondary | ICD-10-CM | POA: Diagnosis not present

## 2018-08-21 DIAGNOSIS — I129 Hypertensive chronic kidney disease with stage 1 through stage 4 chronic kidney disease, or unspecified chronic kidney disease: Secondary | ICD-10-CM | POA: Diagnosis not present

## 2018-08-21 DIAGNOSIS — M0609 Rheumatoid arthritis without rheumatoid factor, multiple sites: Secondary | ICD-10-CM | POA: Diagnosis not present

## 2018-08-21 DIAGNOSIS — E1122 Type 2 diabetes mellitus with diabetic chronic kidney disease: Secondary | ICD-10-CM | POA: Diagnosis not present

## 2018-08-21 DIAGNOSIS — E1142 Type 2 diabetes mellitus with diabetic polyneuropathy: Secondary | ICD-10-CM | POA: Diagnosis not present

## 2018-08-23 DIAGNOSIS — E1122 Type 2 diabetes mellitus with diabetic chronic kidney disease: Secondary | ICD-10-CM | POA: Diagnosis not present

## 2018-08-23 DIAGNOSIS — L89623 Pressure ulcer of left heel, stage 3: Secondary | ICD-10-CM | POA: Diagnosis not present

## 2018-08-23 DIAGNOSIS — Z09 Encounter for follow-up examination after completed treatment for conditions other than malignant neoplasm: Secondary | ICD-10-CM | POA: Diagnosis not present

## 2018-08-23 DIAGNOSIS — M0609 Rheumatoid arthritis without rheumatoid factor, multiple sites: Secondary | ICD-10-CM | POA: Diagnosis not present

## 2018-08-23 DIAGNOSIS — Z87828 Personal history of other (healed) physical injury and trauma: Secondary | ICD-10-CM | POA: Diagnosis not present

## 2018-08-23 DIAGNOSIS — N183 Chronic kidney disease, stage 3 (moderate): Secondary | ICD-10-CM | POA: Diagnosis not present

## 2018-08-23 DIAGNOSIS — I129 Hypertensive chronic kidney disease with stage 1 through stage 4 chronic kidney disease, or unspecified chronic kidney disease: Secondary | ICD-10-CM | POA: Diagnosis not present

## 2018-08-23 DIAGNOSIS — T8189XA Other complications of procedures, not elsewhere classified, initial encounter: Secondary | ICD-10-CM | POA: Diagnosis not present

## 2018-08-23 DIAGNOSIS — L89629 Pressure ulcer of left heel, unspecified stage: Secondary | ICD-10-CM | POA: Diagnosis not present

## 2018-08-23 DIAGNOSIS — E1142 Type 2 diabetes mellitus with diabetic polyneuropathy: Secondary | ICD-10-CM | POA: Diagnosis not present

## 2018-08-25 DIAGNOSIS — M0609 Rheumatoid arthritis without rheumatoid factor, multiple sites: Secondary | ICD-10-CM | POA: Diagnosis not present

## 2018-08-25 DIAGNOSIS — I129 Hypertensive chronic kidney disease with stage 1 through stage 4 chronic kidney disease, or unspecified chronic kidney disease: Secondary | ICD-10-CM | POA: Diagnosis not present

## 2018-08-25 DIAGNOSIS — E1122 Type 2 diabetes mellitus with diabetic chronic kidney disease: Secondary | ICD-10-CM | POA: Diagnosis not present

## 2018-08-25 DIAGNOSIS — N183 Chronic kidney disease, stage 3 (moderate): Secondary | ICD-10-CM | POA: Diagnosis not present

## 2018-08-25 DIAGNOSIS — E1142 Type 2 diabetes mellitus with diabetic polyneuropathy: Secondary | ICD-10-CM | POA: Diagnosis not present

## 2018-08-25 DIAGNOSIS — L89623 Pressure ulcer of left heel, stage 3: Secondary | ICD-10-CM | POA: Diagnosis not present

## 2018-08-28 DIAGNOSIS — E1142 Type 2 diabetes mellitus with diabetic polyneuropathy: Secondary | ICD-10-CM | POA: Diagnosis not present

## 2018-08-28 DIAGNOSIS — E1122 Type 2 diabetes mellitus with diabetic chronic kidney disease: Secondary | ICD-10-CM | POA: Diagnosis not present

## 2018-08-28 DIAGNOSIS — N183 Chronic kidney disease, stage 3 (moderate): Secondary | ICD-10-CM | POA: Diagnosis not present

## 2018-08-28 DIAGNOSIS — L89623 Pressure ulcer of left heel, stage 3: Secondary | ICD-10-CM | POA: Diagnosis not present

## 2018-08-28 DIAGNOSIS — I129 Hypertensive chronic kidney disease with stage 1 through stage 4 chronic kidney disease, or unspecified chronic kidney disease: Secondary | ICD-10-CM | POA: Diagnosis not present

## 2018-08-28 DIAGNOSIS — M0609 Rheumatoid arthritis without rheumatoid factor, multiple sites: Secondary | ICD-10-CM | POA: Diagnosis not present

## 2018-08-30 DIAGNOSIS — E1142 Type 2 diabetes mellitus with diabetic polyneuropathy: Secondary | ICD-10-CM | POA: Diagnosis not present

## 2018-08-30 DIAGNOSIS — Z602 Problems related to living alone: Secondary | ICD-10-CM | POA: Diagnosis not present

## 2018-08-30 DIAGNOSIS — E039 Hypothyroidism, unspecified: Secondary | ICD-10-CM | POA: Diagnosis not present

## 2018-08-30 DIAGNOSIS — J90 Pleural effusion, not elsewhere classified: Secondary | ICD-10-CM | POA: Diagnosis not present

## 2018-08-30 DIAGNOSIS — Z79891 Long term (current) use of opiate analgesic: Secondary | ICD-10-CM | POA: Diagnosis not present

## 2018-08-30 DIAGNOSIS — Z7982 Long term (current) use of aspirin: Secondary | ICD-10-CM | POA: Diagnosis not present

## 2018-08-30 DIAGNOSIS — G8929 Other chronic pain: Secondary | ICD-10-CM | POA: Diagnosis not present

## 2018-08-30 DIAGNOSIS — I129 Hypertensive chronic kidney disease with stage 1 through stage 4 chronic kidney disease, or unspecified chronic kidney disease: Secondary | ICD-10-CM | POA: Diagnosis not present

## 2018-08-30 DIAGNOSIS — R911 Solitary pulmonary nodule: Secondary | ICD-10-CM | POA: Diagnosis not present

## 2018-08-30 DIAGNOSIS — Z86718 Personal history of other venous thrombosis and embolism: Secondary | ICD-10-CM | POA: Diagnosis not present

## 2018-08-30 DIAGNOSIS — Z7952 Long term (current) use of systemic steroids: Secondary | ICD-10-CM | POA: Diagnosis not present

## 2018-08-30 DIAGNOSIS — Z794 Long term (current) use of insulin: Secondary | ICD-10-CM | POA: Diagnosis not present

## 2018-08-30 DIAGNOSIS — M0609 Rheumatoid arthritis without rheumatoid factor, multiple sites: Secondary | ICD-10-CM | POA: Diagnosis not present

## 2018-08-30 DIAGNOSIS — E1122 Type 2 diabetes mellitus with diabetic chronic kidney disease: Secondary | ICD-10-CM | POA: Diagnosis not present

## 2018-08-30 DIAGNOSIS — N183 Chronic kidney disease, stage 3 (moderate): Secondary | ICD-10-CM | POA: Diagnosis not present

## 2018-08-31 DIAGNOSIS — G8929 Other chronic pain: Secondary | ICD-10-CM | POA: Diagnosis not present

## 2018-08-31 DIAGNOSIS — R1031 Right lower quadrant pain: Secondary | ICD-10-CM | POA: Diagnosis not present

## 2018-09-04 DIAGNOSIS — R1031 Right lower quadrant pain: Secondary | ICD-10-CM | POA: Diagnosis not present

## 2018-09-04 DIAGNOSIS — E1142 Type 2 diabetes mellitus with diabetic polyneuropathy: Secondary | ICD-10-CM | POA: Diagnosis not present

## 2018-09-04 DIAGNOSIS — E1122 Type 2 diabetes mellitus with diabetic chronic kidney disease: Secondary | ICD-10-CM | POA: Diagnosis not present

## 2018-09-04 DIAGNOSIS — M0609 Rheumatoid arthritis without rheumatoid factor, multiple sites: Secondary | ICD-10-CM | POA: Diagnosis not present

## 2018-09-04 DIAGNOSIS — N183 Chronic kidney disease, stage 3 (moderate): Secondary | ICD-10-CM | POA: Diagnosis not present

## 2018-09-04 DIAGNOSIS — J90 Pleural effusion, not elsewhere classified: Secondary | ICD-10-CM | POA: Diagnosis not present

## 2018-09-04 DIAGNOSIS — I129 Hypertensive chronic kidney disease with stage 1 through stage 4 chronic kidney disease, or unspecified chronic kidney disease: Secondary | ICD-10-CM | POA: Diagnosis not present

## 2018-09-06 ENCOUNTER — Ambulatory Visit: Payer: Medicare Other | Admitting: Sports Medicine

## 2018-09-06 DIAGNOSIS — J90 Pleural effusion, not elsewhere classified: Secondary | ICD-10-CM | POA: Diagnosis not present

## 2018-09-11 DIAGNOSIS — N183 Chronic kidney disease, stage 3 (moderate): Secondary | ICD-10-CM | POA: Diagnosis not present

## 2018-09-11 DIAGNOSIS — M0609 Rheumatoid arthritis without rheumatoid factor, multiple sites: Secondary | ICD-10-CM | POA: Diagnosis not present

## 2018-09-11 DIAGNOSIS — E1142 Type 2 diabetes mellitus with diabetic polyneuropathy: Secondary | ICD-10-CM | POA: Diagnosis not present

## 2018-09-11 DIAGNOSIS — I129 Hypertensive chronic kidney disease with stage 1 through stage 4 chronic kidney disease, or unspecified chronic kidney disease: Secondary | ICD-10-CM | POA: Diagnosis not present

## 2018-09-11 DIAGNOSIS — J90 Pleural effusion, not elsewhere classified: Secondary | ICD-10-CM | POA: Diagnosis not present

## 2018-09-11 DIAGNOSIS — E1122 Type 2 diabetes mellitus with diabetic chronic kidney disease: Secondary | ICD-10-CM | POA: Diagnosis not present

## 2018-09-15 ENCOUNTER — Encounter: Payer: Self-pay | Admitting: Sports Medicine

## 2018-09-15 ENCOUNTER — Other Ambulatory Visit: Payer: Self-pay

## 2018-09-15 ENCOUNTER — Ambulatory Visit (INDEPENDENT_AMBULATORY_CARE_PROVIDER_SITE_OTHER): Payer: Medicare Other | Admitting: Sports Medicine

## 2018-09-15 VITALS — BP 151/71 | HR 65 | Temp 98.4°F | Resp 16

## 2018-09-15 DIAGNOSIS — M7752 Other enthesopathy of left foot: Secondary | ICD-10-CM | POA: Diagnosis not present

## 2018-09-15 DIAGNOSIS — B351 Tinea unguium: Secondary | ICD-10-CM | POA: Diagnosis not present

## 2018-09-15 DIAGNOSIS — I739 Peripheral vascular disease, unspecified: Secondary | ICD-10-CM

## 2018-09-15 DIAGNOSIS — M79675 Pain in left toe(s): Secondary | ICD-10-CM

## 2018-09-15 DIAGNOSIS — L97521 Non-pressure chronic ulcer of other part of left foot limited to breakdown of skin: Secondary | ICD-10-CM

## 2018-09-15 DIAGNOSIS — M79674 Pain in right toe(s): Secondary | ICD-10-CM | POA: Diagnosis not present

## 2018-09-15 DIAGNOSIS — E1142 Type 2 diabetes mellitus with diabetic polyneuropathy: Secondary | ICD-10-CM

## 2018-09-15 DIAGNOSIS — M79672 Pain in left foot: Secondary | ICD-10-CM

## 2018-09-15 DIAGNOSIS — L97429 Non-pressure chronic ulcer of left heel and midfoot with unspecified severity: Secondary | ICD-10-CM

## 2018-09-15 MED ORDER — TRIAMCINOLONE ACETONIDE 10 MG/ML IJ SUSP
10.0000 mg | Freq: Once | INTRAMUSCULAR | Status: AC
  Administered 2018-09-15: 23:00:00 10 mg
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
## 2018-09-18 DIAGNOSIS — I129 Hypertensive chronic kidney disease with stage 1 through stage 4 chronic kidney disease, or unspecified chronic kidney disease: Secondary | ICD-10-CM | POA: Diagnosis not present

## 2018-09-18 DIAGNOSIS — N183 Chronic kidney disease, stage 3 (moderate): Secondary | ICD-10-CM | POA: Diagnosis not present

## 2018-09-18 DIAGNOSIS — M0609 Rheumatoid arthritis without rheumatoid factor, multiple sites: Secondary | ICD-10-CM | POA: Diagnosis not present

## 2018-09-18 DIAGNOSIS — J90 Pleural effusion, not elsewhere classified: Secondary | ICD-10-CM | POA: Diagnosis not present

## 2018-09-18 DIAGNOSIS — E1142 Type 2 diabetes mellitus with diabetic polyneuropathy: Secondary | ICD-10-CM | POA: Diagnosis not present

## 2018-09-18 DIAGNOSIS — E1122 Type 2 diabetes mellitus with diabetic chronic kidney disease: Secondary | ICD-10-CM | POA: Diagnosis not present

## 2018-09-26 DIAGNOSIS — N183 Chronic kidney disease, stage 3 (moderate): Secondary | ICD-10-CM | POA: Diagnosis not present

## 2018-09-26 DIAGNOSIS — I129 Hypertensive chronic kidney disease with stage 1 through stage 4 chronic kidney disease, or unspecified chronic kidney disease: Secondary | ICD-10-CM | POA: Diagnosis not present

## 2018-09-26 DIAGNOSIS — E1142 Type 2 diabetes mellitus with diabetic polyneuropathy: Secondary | ICD-10-CM | POA: Diagnosis not present

## 2018-09-26 DIAGNOSIS — E1122 Type 2 diabetes mellitus with diabetic chronic kidney disease: Secondary | ICD-10-CM | POA: Diagnosis not present

## 2018-09-26 DIAGNOSIS — J90 Pleural effusion, not elsewhere classified: Secondary | ICD-10-CM | POA: Diagnosis not present

## 2018-09-26 DIAGNOSIS — M0609 Rheumatoid arthritis without rheumatoid factor, multiple sites: Secondary | ICD-10-CM | POA: Diagnosis not present

## 2018-09-29 DIAGNOSIS — M0609 Rheumatoid arthritis without rheumatoid factor, multiple sites: Secondary | ICD-10-CM | POA: Diagnosis not present

## 2018-09-29 DIAGNOSIS — Z7982 Long term (current) use of aspirin: Secondary | ICD-10-CM | POA: Diagnosis not present

## 2018-09-29 DIAGNOSIS — Z7952 Long term (current) use of systemic steroids: Secondary | ICD-10-CM | POA: Diagnosis not present

## 2018-09-29 DIAGNOSIS — N183 Chronic kidney disease, stage 3 (moderate): Secondary | ICD-10-CM | POA: Diagnosis not present

## 2018-09-29 DIAGNOSIS — R911 Solitary pulmonary nodule: Secondary | ICD-10-CM | POA: Diagnosis not present

## 2018-09-29 DIAGNOSIS — G8929 Other chronic pain: Secondary | ICD-10-CM | POA: Diagnosis not present

## 2018-09-29 DIAGNOSIS — J90 Pleural effusion, not elsewhere classified: Secondary | ICD-10-CM | POA: Diagnosis not present

## 2018-09-29 DIAGNOSIS — E1122 Type 2 diabetes mellitus with diabetic chronic kidney disease: Secondary | ICD-10-CM | POA: Diagnosis not present

## 2018-09-29 DIAGNOSIS — E039 Hypothyroidism, unspecified: Secondary | ICD-10-CM | POA: Diagnosis not present

## 2018-09-29 DIAGNOSIS — Z79891 Long term (current) use of opiate analgesic: Secondary | ICD-10-CM | POA: Diagnosis not present

## 2018-09-29 DIAGNOSIS — I129 Hypertensive chronic kidney disease with stage 1 through stage 4 chronic kidney disease, or unspecified chronic kidney disease: Secondary | ICD-10-CM | POA: Diagnosis not present

## 2018-09-29 DIAGNOSIS — E1142 Type 2 diabetes mellitus with diabetic polyneuropathy: Secondary | ICD-10-CM | POA: Diagnosis not present

## 2018-09-29 DIAGNOSIS — Z794 Long term (current) use of insulin: Secondary | ICD-10-CM | POA: Diagnosis not present

## 2018-09-29 DIAGNOSIS — Z86718 Personal history of other venous thrombosis and embolism: Secondary | ICD-10-CM | POA: Diagnosis not present

## 2018-09-29 DIAGNOSIS — Z602 Problems related to living alone: Secondary | ICD-10-CM | POA: Diagnosis not present

## 2018-10-02 DIAGNOSIS — N183 Chronic kidney disease, stage 3 (moderate): Secondary | ICD-10-CM | POA: Diagnosis not present

## 2018-10-02 DIAGNOSIS — M0609 Rheumatoid arthritis without rheumatoid factor, multiple sites: Secondary | ICD-10-CM | POA: Diagnosis not present

## 2018-10-02 DIAGNOSIS — I129 Hypertensive chronic kidney disease with stage 1 through stage 4 chronic kidney disease, or unspecified chronic kidney disease: Secondary | ICD-10-CM | POA: Diagnosis not present

## 2018-10-02 DIAGNOSIS — J90 Pleural effusion, not elsewhere classified: Secondary | ICD-10-CM | POA: Diagnosis not present

## 2018-10-02 DIAGNOSIS — E1122 Type 2 diabetes mellitus with diabetic chronic kidney disease: Secondary | ICD-10-CM | POA: Diagnosis not present

## 2018-10-02 DIAGNOSIS — E1142 Type 2 diabetes mellitus with diabetic polyneuropathy: Secondary | ICD-10-CM | POA: Diagnosis not present

## 2018-10-11 ENCOUNTER — Ambulatory Visit (INDEPENDENT_AMBULATORY_CARE_PROVIDER_SITE_OTHER): Payer: Medicare Other | Admitting: Sports Medicine

## 2018-10-11 ENCOUNTER — Encounter: Payer: Self-pay | Admitting: Sports Medicine

## 2018-10-11 ENCOUNTER — Other Ambulatory Visit: Payer: Self-pay

## 2018-10-11 VITALS — Temp 95.5°F | Resp 16

## 2018-10-11 DIAGNOSIS — K8689 Other specified diseases of pancreas: Secondary | ICD-10-CM | POA: Diagnosis not present

## 2018-10-11 DIAGNOSIS — E1142 Type 2 diabetes mellitus with diabetic polyneuropathy: Secondary | ICD-10-CM

## 2018-10-11 DIAGNOSIS — K869 Disease of pancreas, unspecified: Secondary | ICD-10-CM | POA: Diagnosis not present

## 2018-10-11 DIAGNOSIS — J90 Pleural effusion, not elsewhere classified: Secondary | ICD-10-CM | POA: Diagnosis not present

## 2018-10-11 DIAGNOSIS — L97521 Non-pressure chronic ulcer of other part of left foot limited to breakdown of skin: Secondary | ICD-10-CM | POA: Diagnosis not present
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
## 2018-10-14 LAB — WOUND CULTURE: Organism ID, Bacteria: NONE SEEN

## 2018-10-19 DIAGNOSIS — E1122 Type 2 diabetes mellitus with diabetic chronic kidney disease: Secondary | ICD-10-CM | POA: Diagnosis not present

## 2018-10-19 DIAGNOSIS — M0609 Rheumatoid arthritis without rheumatoid factor, multiple sites: Secondary | ICD-10-CM | POA: Diagnosis not present

## 2018-10-19 DIAGNOSIS — N183 Chronic kidney disease, stage 3 (moderate): Secondary | ICD-10-CM | POA: Diagnosis not present

## 2018-10-19 DIAGNOSIS — E1142 Type 2 diabetes mellitus with diabetic polyneuropathy: Secondary | ICD-10-CM | POA: Diagnosis not present

## 2018-10-19 DIAGNOSIS — J90 Pleural effusion, not elsewhere classified: Secondary | ICD-10-CM | POA: Diagnosis not present

## 2018-10-19 DIAGNOSIS — I129 Hypertensive chronic kidney disease with stage 1 through stage 4 chronic kidney disease, or unspecified chronic kidney disease: Secondary | ICD-10-CM | POA: Diagnosis not present

## 2018-10-24 DIAGNOSIS — E113291 Type 2 diabetes mellitus with mild nonproliferative diabetic retinopathy without macular edema, right eye: Secondary | ICD-10-CM | POA: Diagnosis not present

## 2018-10-25 ENCOUNTER — Other Ambulatory Visit: Payer: Self-pay

## 2018-10-25 ENCOUNTER — Encounter: Payer: Self-pay | Admitting: Sports Medicine

## 2018-10-25 ENCOUNTER — Ambulatory Visit (INDEPENDENT_AMBULATORY_CARE_PROVIDER_SITE_OTHER): Payer: Medicare Other | Admitting: Sports Medicine

## 2018-10-25 DIAGNOSIS — E1142 Type 2 diabetes mellitus with diabetic polyneuropathy: Secondary | ICD-10-CM

## 2018-10-25 DIAGNOSIS — M79672 Pain in left foot: Secondary | ICD-10-CM

## 2018-10-25 DIAGNOSIS — L97521 Non-pressure chronic ulcer of other part of left foot limited to breakdown of skin: Secondary | ICD-10-CM | POA: Diagnosis not present

## 2018-10-25 DIAGNOSIS — I739 Peripheral vascular disease, unspecified: Secondary | ICD-10-CM

## 2018-10-25 DIAGNOSIS — M7752 Other enthesopathy of left foot: Secondary | ICD-10-CM
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
## 2018-10-27 DIAGNOSIS — J90 Pleural effusion, not elsewhere classified: Secondary | ICD-10-CM | POA: Diagnosis not present

## 2018-11-09 DIAGNOSIS — G250 Essential tremor: Secondary | ICD-10-CM | POA: Diagnosis not present

## 2018-11-09 DIAGNOSIS — E1165 Type 2 diabetes mellitus with hyperglycemia: Secondary | ICD-10-CM | POA: Diagnosis not present

## 2018-11-09 DIAGNOSIS — E1129 Type 2 diabetes mellitus with other diabetic kidney complication: Secondary | ICD-10-CM | POA: Diagnosis not present

## 2018-11-09 DIAGNOSIS — I87303 Chronic venous hypertension (idiopathic) without complications of bilateral lower extremity: Secondary | ICD-10-CM | POA: Diagnosis not present

## 2018-11-09 DIAGNOSIS — R5383 Other fatigue: Secondary | ICD-10-CM | POA: Diagnosis not present

## 2018-11-09 DIAGNOSIS — I10 Essential (primary) hypertension: Secondary | ICD-10-CM | POA: Diagnosis not present

## 2018-11-09 DIAGNOSIS — R0602 Shortness of breath: Secondary | ICD-10-CM | POA: Diagnosis not present

## 2018-11-10 ENCOUNTER — Ambulatory Visit (INDEPENDENT_AMBULATORY_CARE_PROVIDER_SITE_OTHER): Payer: Medicare Other | Admitting: Sports Medicine

## 2018-11-10 ENCOUNTER — Encounter: Payer: Self-pay | Admitting: Sports Medicine

## 2018-11-10 ENCOUNTER — Other Ambulatory Visit: Payer: Self-pay

## 2018-11-10 VITALS — Temp 99.1°F | Resp 16

## 2018-11-10 DIAGNOSIS — L97521 Non-pressure chronic ulcer of other part of left foot limited to breakdown of skin: Secondary | ICD-10-CM

## 2018-11-10 DIAGNOSIS — I739 Peripheral vascular disease, unspecified: Secondary | ICD-10-CM

## 2018-11-10 DIAGNOSIS — E1142 Type 2 diabetes mellitus with diabetic polyneuropathy: Secondary | ICD-10-CM
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
## 2018-11-15 DIAGNOSIS — M0609 Rheumatoid arthritis without rheumatoid factor, multiple sites: Secondary | ICD-10-CM | POA: Diagnosis not present

## 2018-11-15 DIAGNOSIS — Z86718 Personal history of other venous thrombosis and embolism: Secondary | ICD-10-CM | POA: Diagnosis not present

## 2018-11-15 DIAGNOSIS — Z96653 Presence of artificial knee joint, bilateral: Secondary | ICD-10-CM | POA: Diagnosis not present

## 2018-11-15 DIAGNOSIS — Z602 Problems related to living alone: Secondary | ICD-10-CM | POA: Diagnosis not present

## 2018-11-15 DIAGNOSIS — G8929 Other chronic pain: Secondary | ICD-10-CM | POA: Diagnosis not present

## 2018-11-15 DIAGNOSIS — E1142 Type 2 diabetes mellitus with diabetic polyneuropathy: Secondary | ICD-10-CM | POA: Diagnosis not present

## 2018-11-15 DIAGNOSIS — Z4800 Encounter for change or removal of nonsurgical wound dressing: Secondary | ICD-10-CM | POA: Diagnosis not present

## 2018-11-15 DIAGNOSIS — E782 Mixed hyperlipidemia: Secondary | ICD-10-CM | POA: Diagnosis not present

## 2018-11-15 DIAGNOSIS — Z794 Long term (current) use of insulin: Secondary | ICD-10-CM | POA: Diagnosis not present

## 2018-11-15 DIAGNOSIS — E039 Hypothyroidism, unspecified: Secondary | ICD-10-CM | POA: Diagnosis not present

## 2018-11-15 DIAGNOSIS — Z79891 Long term (current) use of opiate analgesic: Secondary | ICD-10-CM | POA: Diagnosis not present

## 2018-11-15 DIAGNOSIS — Z7982 Long term (current) use of aspirin: Secondary | ICD-10-CM | POA: Diagnosis not present

## 2018-11-15 DIAGNOSIS — N183 Chronic kidney disease, stage 3 (moderate): Secondary | ICD-10-CM | POA: Diagnosis not present

## 2018-11-15 DIAGNOSIS — I87303 Chronic venous hypertension (idiopathic) without complications of bilateral lower extremity: Secondary | ICD-10-CM | POA: Diagnosis not present

## 2018-11-15 DIAGNOSIS — Z7952 Long term (current) use of systemic steroids: Secondary | ICD-10-CM | POA: Diagnosis not present

## 2018-11-15 DIAGNOSIS — Z9049 Acquired absence of other specified parts of digestive tract: Secondary | ICD-10-CM | POA: Diagnosis not present

## 2018-11-15 DIAGNOSIS — I129 Hypertensive chronic kidney disease with stage 1 through stage 4 chronic kidney disease, or unspecified chronic kidney disease: Secondary | ICD-10-CM | POA: Diagnosis not present

## 2018-11-15 DIAGNOSIS — E1122 Type 2 diabetes mellitus with diabetic chronic kidney disease: Secondary | ICD-10-CM | POA: Diagnosis not present

## 2018-11-15 DIAGNOSIS — I6930 Unspecified sequelae of cerebral infarction: Secondary | ICD-10-CM | POA: Diagnosis not present

## 2018-11-15 DIAGNOSIS — G250 Essential tremor: Secondary | ICD-10-CM | POA: Diagnosis not present

## 2018-11-20 DIAGNOSIS — I129 Hypertensive chronic kidney disease with stage 1 through stage 4 chronic kidney disease, or unspecified chronic kidney disease: Secondary | ICD-10-CM | POA: Diagnosis not present

## 2018-11-20 DIAGNOSIS — E1122 Type 2 diabetes mellitus with diabetic chronic kidney disease: Secondary | ICD-10-CM | POA: Diagnosis not present

## 2018-11-20 DIAGNOSIS — Z4800 Encounter for change or removal of nonsurgical wound dressing: Secondary | ICD-10-CM | POA: Diagnosis not present

## 2018-11-20 DIAGNOSIS — I87303 Chronic venous hypertension (idiopathic) without complications of bilateral lower extremity: Secondary | ICD-10-CM | POA: Diagnosis not present

## 2018-11-20 DIAGNOSIS — E1142 Type 2 diabetes mellitus with diabetic polyneuropathy: Secondary | ICD-10-CM | POA: Diagnosis not present

## 2018-11-20 DIAGNOSIS — N183 Chronic kidney disease, stage 3 (moderate): Secondary | ICD-10-CM | POA: Diagnosis not present

## 2018-11-21 DIAGNOSIS — I87303 Chronic venous hypertension (idiopathic) without complications of bilateral lower extremity: Secondary | ICD-10-CM | POA: Diagnosis not present

## 2018-11-23 DIAGNOSIS — E1122 Type 2 diabetes mellitus with diabetic chronic kidney disease: Secondary | ICD-10-CM | POA: Diagnosis not present

## 2018-11-23 DIAGNOSIS — N183 Chronic kidney disease, stage 3 (moderate): Secondary | ICD-10-CM | POA: Diagnosis not present

## 2018-11-23 DIAGNOSIS — I87303 Chronic venous hypertension (idiopathic) without complications of bilateral lower extremity: Secondary | ICD-10-CM | POA: Diagnosis not present

## 2018-11-23 DIAGNOSIS — E1142 Type 2 diabetes mellitus with diabetic polyneuropathy: Secondary | ICD-10-CM | POA: Diagnosis not present

## 2018-11-23 DIAGNOSIS — I129 Hypertensive chronic kidney disease with stage 1 through stage 4 chronic kidney disease, or unspecified chronic kidney disease: Secondary | ICD-10-CM | POA: Diagnosis not present

## 2018-11-23 DIAGNOSIS — Z4800 Encounter for change or removal of nonsurgical wound dressing: Secondary | ICD-10-CM | POA: Diagnosis not present

## 2018-11-24 ENCOUNTER — Ambulatory Visit (INDEPENDENT_AMBULATORY_CARE_PROVIDER_SITE_OTHER): Payer: Medicare Other | Admitting: Sports Medicine

## 2018-11-24 ENCOUNTER — Other Ambulatory Visit: Payer: Self-pay

## 2018-11-24 ENCOUNTER — Encounter: Payer: Self-pay | Admitting: Sports Medicine

## 2018-11-24 VITALS — Temp 97.3°F | Resp 16

## 2018-11-24 DIAGNOSIS — I739 Peripheral vascular disease, unspecified: Secondary | ICD-10-CM

## 2018-11-24 DIAGNOSIS — E1142 Type 2 diabetes mellitus with diabetic polyneuropathy: Secondary | ICD-10-CM | POA: Diagnosis not present

## 2018-11-24 DIAGNOSIS — L97521 Non-pressure chronic ulcer of other part of left foot limited to breakdown of skin: Secondary | ICD-10-CM
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
## 2018-11-27 DIAGNOSIS — E1122 Type 2 diabetes mellitus with diabetic chronic kidney disease: Secondary | ICD-10-CM | POA: Diagnosis not present

## 2018-11-27 DIAGNOSIS — I87303 Chronic venous hypertension (idiopathic) without complications of bilateral lower extremity: Secondary | ICD-10-CM | POA: Diagnosis not present

## 2018-11-27 DIAGNOSIS — Z4800 Encounter for change or removal of nonsurgical wound dressing: Secondary | ICD-10-CM | POA: Diagnosis not present

## 2018-11-27 DIAGNOSIS — I129 Hypertensive chronic kidney disease with stage 1 through stage 4 chronic kidney disease, or unspecified chronic kidney disease: Secondary | ICD-10-CM | POA: Diagnosis not present

## 2018-11-27 DIAGNOSIS — N183 Chronic kidney disease, stage 3 (moderate): Secondary | ICD-10-CM | POA: Diagnosis not present

## 2018-11-27 DIAGNOSIS — E1142 Type 2 diabetes mellitus with diabetic polyneuropathy: Secondary | ICD-10-CM | POA: Diagnosis not present

## 2018-11-28 DIAGNOSIS — Z4800 Encounter for change or removal of nonsurgical wound dressing: Secondary | ICD-10-CM | POA: Diagnosis not present

## 2018-11-28 DIAGNOSIS — I87303 Chronic venous hypertension (idiopathic) without complications of bilateral lower extremity: Secondary | ICD-10-CM | POA: Diagnosis not present

## 2018-11-30 DIAGNOSIS — E1122 Type 2 diabetes mellitus with diabetic chronic kidney disease: Secondary | ICD-10-CM | POA: Diagnosis not present

## 2018-11-30 DIAGNOSIS — Z4800 Encounter for change or removal of nonsurgical wound dressing: Secondary | ICD-10-CM | POA: Diagnosis not present

## 2018-11-30 DIAGNOSIS — I87303 Chronic venous hypertension (idiopathic) without complications of bilateral lower extremity: Secondary | ICD-10-CM | POA: Diagnosis not present

## 2018-11-30 DIAGNOSIS — E1142 Type 2 diabetes mellitus with diabetic polyneuropathy: Secondary | ICD-10-CM | POA: Diagnosis not present

## 2018-11-30 DIAGNOSIS — I129 Hypertensive chronic kidney disease with stage 1 through stage 4 chronic kidney disease, or unspecified chronic kidney disease: Secondary | ICD-10-CM | POA: Diagnosis not present

## 2018-11-30 DIAGNOSIS — N183 Chronic kidney disease, stage 3 (moderate): Secondary | ICD-10-CM | POA: Diagnosis not present

## 2018-12-04 DIAGNOSIS — E1122 Type 2 diabetes mellitus with diabetic chronic kidney disease: Secondary | ICD-10-CM | POA: Diagnosis not present

## 2018-12-04 DIAGNOSIS — E1142 Type 2 diabetes mellitus with diabetic polyneuropathy: Secondary | ICD-10-CM | POA: Diagnosis not present

## 2018-12-04 DIAGNOSIS — I129 Hypertensive chronic kidney disease with stage 1 through stage 4 chronic kidney disease, or unspecified chronic kidney disease: Secondary | ICD-10-CM | POA: Diagnosis not present

## 2018-12-04 DIAGNOSIS — Z4800 Encounter for change or removal of nonsurgical wound dressing: Secondary | ICD-10-CM | POA: Diagnosis not present

## 2018-12-04 DIAGNOSIS — I87303 Chronic venous hypertension (idiopathic) without complications of bilateral lower extremity: Secondary | ICD-10-CM | POA: Diagnosis not present

## 2018-12-04 DIAGNOSIS — N183 Chronic kidney disease, stage 3 (moderate): Secondary | ICD-10-CM | POA: Diagnosis not present

## 2018-12-05 DIAGNOSIS — Z4800 Encounter for change or removal of nonsurgical wound dressing: Secondary | ICD-10-CM | POA: Diagnosis not present

## 2018-12-05 DIAGNOSIS — I87303 Chronic venous hypertension (idiopathic) without complications of bilateral lower extremity: Secondary | ICD-10-CM | POA: Diagnosis not present

## 2018-12-07 DIAGNOSIS — G8929 Other chronic pain: Secondary | ICD-10-CM | POA: Diagnosis not present

## 2018-12-07 DIAGNOSIS — M069 Rheumatoid arthritis, unspecified: Secondary | ICD-10-CM | POA: Diagnosis not present

## 2018-12-07 DIAGNOSIS — Z4800 Encounter for change or removal of nonsurgical wound dressing: Secondary | ICD-10-CM | POA: Diagnosis not present

## 2018-12-07 DIAGNOSIS — I129 Hypertensive chronic kidney disease with stage 1 through stage 4 chronic kidney disease, or unspecified chronic kidney disease: Secondary | ICD-10-CM | POA: Diagnosis not present

## 2018-12-07 DIAGNOSIS — I87303 Chronic venous hypertension (idiopathic) without complications of bilateral lower extremity: Secondary | ICD-10-CM | POA: Diagnosis not present

## 2018-12-07 DIAGNOSIS — E1122 Type 2 diabetes mellitus with diabetic chronic kidney disease: Secondary | ICD-10-CM | POA: Diagnosis not present

## 2018-12-07 DIAGNOSIS — E1140 Type 2 diabetes mellitus with diabetic neuropathy, unspecified: Secondary | ICD-10-CM | POA: Diagnosis not present

## 2018-12-07 DIAGNOSIS — E1142 Type 2 diabetes mellitus with diabetic polyneuropathy: Secondary | ICD-10-CM | POA: Diagnosis not present

## 2018-12-07 DIAGNOSIS — N183 Chronic kidney disease, stage 3 (moderate): Secondary | ICD-10-CM | POA: Diagnosis not present

## 2018-12-07 DIAGNOSIS — E1165 Type 2 diabetes mellitus with hyperglycemia: Secondary | ICD-10-CM | POA: Diagnosis not present

## 2018-12-11 DIAGNOSIS — E1122 Type 2 diabetes mellitus with diabetic chronic kidney disease: Secondary | ICD-10-CM | POA: Diagnosis not present

## 2018-12-11 DIAGNOSIS — Z4800 Encounter for change or removal of nonsurgical wound dressing: Secondary | ICD-10-CM | POA: Diagnosis not present

## 2018-12-11 DIAGNOSIS — E1142 Type 2 diabetes mellitus with diabetic polyneuropathy: Secondary | ICD-10-CM | POA: Diagnosis not present

## 2018-12-11 DIAGNOSIS — I87303 Chronic venous hypertension (idiopathic) without complications of bilateral lower extremity: Secondary | ICD-10-CM | POA: Diagnosis not present

## 2018-12-11 DIAGNOSIS — I129 Hypertensive chronic kidney disease with stage 1 through stage 4 chronic kidney disease, or unspecified chronic kidney disease: Secondary | ICD-10-CM | POA: Diagnosis not present

## 2018-12-11 DIAGNOSIS — N183 Chronic kidney disease, stage 3 (moderate): Secondary | ICD-10-CM | POA: Diagnosis not present

## 2018-12-14 ENCOUNTER — Ambulatory Visit: Payer: Medicare Other | Admitting: Sports Medicine

## 2018-12-14 DIAGNOSIS — N183 Chronic kidney disease, stage 3 (moderate): Secondary | ICD-10-CM | POA: Diagnosis not present

## 2018-12-14 DIAGNOSIS — E1142 Type 2 diabetes mellitus with diabetic polyneuropathy: Secondary | ICD-10-CM | POA: Diagnosis not present

## 2018-12-14 DIAGNOSIS — I87303 Chronic venous hypertension (idiopathic) without complications of bilateral lower extremity: Secondary | ICD-10-CM | POA: Diagnosis not present

## 2018-12-14 DIAGNOSIS — Z4800 Encounter for change or removal of nonsurgical wound dressing: Secondary | ICD-10-CM | POA: Diagnosis not present

## 2018-12-14 DIAGNOSIS — E1122 Type 2 diabetes mellitus with diabetic chronic kidney disease: Secondary | ICD-10-CM | POA: Diagnosis not present

## 2018-12-14 DIAGNOSIS — I129 Hypertensive chronic kidney disease with stage 1 through stage 4 chronic kidney disease, or unspecified chronic kidney disease: Secondary | ICD-10-CM | POA: Diagnosis not present

## 2018-12-15 ENCOUNTER — Ambulatory Visit: Payer: Medicare Other | Admitting: Sports Medicine

## 2018-12-15 DIAGNOSIS — E1142 Type 2 diabetes mellitus with diabetic polyneuropathy: Secondary | ICD-10-CM | POA: Diagnosis not present

## 2018-12-15 DIAGNOSIS — E039 Hypothyroidism, unspecified: Secondary | ICD-10-CM | POA: Diagnosis not present

## 2018-12-15 DIAGNOSIS — I6930 Unspecified sequelae of cerebral infarction: Secondary | ICD-10-CM | POA: Diagnosis not present

## 2018-12-15 DIAGNOSIS — Z86718 Personal history of other venous thrombosis and embolism: Secondary | ICD-10-CM | POA: Diagnosis not present

## 2018-12-15 DIAGNOSIS — Z79891 Long term (current) use of opiate analgesic: Secondary | ICD-10-CM | POA: Diagnosis not present

## 2018-12-15 DIAGNOSIS — Z794 Long term (current) use of insulin: Secondary | ICD-10-CM | POA: Diagnosis not present

## 2018-12-15 DIAGNOSIS — I87303 Chronic venous hypertension (idiopathic) without complications of bilateral lower extremity: Secondary | ICD-10-CM | POA: Diagnosis not present

## 2018-12-15 DIAGNOSIS — Z4800 Encounter for change or removal of nonsurgical wound dressing: Secondary | ICD-10-CM | POA: Diagnosis not present

## 2018-12-15 DIAGNOSIS — G8929 Other chronic pain: Secondary | ICD-10-CM | POA: Diagnosis not present

## 2018-12-15 DIAGNOSIS — G250 Essential tremor: Secondary | ICD-10-CM | POA: Diagnosis not present

## 2018-12-15 DIAGNOSIS — Z602 Problems related to living alone: Secondary | ICD-10-CM | POA: Diagnosis not present

## 2018-12-15 DIAGNOSIS — N183 Chronic kidney disease, stage 3 (moderate): Secondary | ICD-10-CM | POA: Diagnosis not present

## 2018-12-15 DIAGNOSIS — Z9049 Acquired absence of other specified parts of digestive tract: Secondary | ICD-10-CM | POA: Diagnosis not present

## 2018-12-15 DIAGNOSIS — I129 Hypertensive chronic kidney disease with stage 1 through stage 4 chronic kidney disease, or unspecified chronic kidney disease: Secondary | ICD-10-CM | POA: Diagnosis not present

## 2018-12-15 DIAGNOSIS — Z96653 Presence of artificial knee joint, bilateral: Secondary | ICD-10-CM | POA: Diagnosis not present

## 2018-12-15 DIAGNOSIS — Z7952 Long term (current) use of systemic steroids: Secondary | ICD-10-CM | POA: Diagnosis not present

## 2018-12-15 DIAGNOSIS — E1122 Type 2 diabetes mellitus with diabetic chronic kidney disease: Secondary | ICD-10-CM | POA: Diagnosis not present

## 2018-12-15 DIAGNOSIS — M0609 Rheumatoid arthritis without rheumatoid factor, multiple sites: Secondary | ICD-10-CM | POA: Diagnosis not present

## 2018-12-15 DIAGNOSIS — E782 Mixed hyperlipidemia: Secondary | ICD-10-CM | POA: Diagnosis not present

## 2018-12-15 DIAGNOSIS — Z7982 Long term (current) use of aspirin: Secondary | ICD-10-CM | POA: Diagnosis not present

## 2018-12-18 DIAGNOSIS — N183 Chronic kidney disease, stage 3 (moderate): Secondary | ICD-10-CM | POA: Diagnosis not present

## 2018-12-18 DIAGNOSIS — I129 Hypertensive chronic kidney disease with stage 1 through stage 4 chronic kidney disease, or unspecified chronic kidney disease: Secondary | ICD-10-CM | POA: Diagnosis not present

## 2018-12-18 DIAGNOSIS — E1122 Type 2 diabetes mellitus with diabetic chronic kidney disease: Secondary | ICD-10-CM | POA: Diagnosis not present

## 2018-12-18 DIAGNOSIS — I87303 Chronic venous hypertension (idiopathic) without complications of bilateral lower extremity: Secondary | ICD-10-CM | POA: Diagnosis not present

## 2018-12-18 DIAGNOSIS — E1142 Type 2 diabetes mellitus with diabetic polyneuropathy: Secondary | ICD-10-CM | POA: Diagnosis not present

## 2018-12-18 DIAGNOSIS — Z4800 Encounter for change or removal of nonsurgical wound dressing: Secondary | ICD-10-CM | POA: Diagnosis not present

## 2018-12-19 DIAGNOSIS — M24549 Contracture, unspecified hand: Secondary | ICD-10-CM | POA: Diagnosis not present

## 2018-12-20 ENCOUNTER — Other Ambulatory Visit: Payer: Self-pay

## 2018-12-20 ENCOUNTER — Encounter: Payer: Self-pay | Admitting: Sports Medicine

## 2018-12-20 ENCOUNTER — Ambulatory Visit (INDEPENDENT_AMBULATORY_CARE_PROVIDER_SITE_OTHER): Payer: Medicare Other | Admitting: Sports Medicine

## 2018-12-20 DIAGNOSIS — L97521 Non-pressure chronic ulcer of other part of left foot limited to breakdown of skin: Secondary | ICD-10-CM | POA: Diagnosis not present

## 2018-12-20 DIAGNOSIS — E1142 Type 2 diabetes mellitus with diabetic polyneuropathy: Secondary | ICD-10-CM

## 2018-12-20 DIAGNOSIS — I739 Peripheral vascular disease, unspecified: Secondary | ICD-10-CM
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
## 2018-12-21 ENCOUNTER — Telehealth: Payer: Self-pay | Admitting: *Deleted

## 2018-12-21 DIAGNOSIS — E1142 Type 2 diabetes mellitus with diabetic polyneuropathy: Secondary | ICD-10-CM | POA: Diagnosis not present

## 2018-12-21 DIAGNOSIS — N183 Chronic kidney disease, stage 3 (moderate): Secondary | ICD-10-CM | POA: Diagnosis not present

## 2018-12-21 DIAGNOSIS — I129 Hypertensive chronic kidney disease with stage 1 through stage 4 chronic kidney disease, or unspecified chronic kidney disease: Secondary | ICD-10-CM | POA: Diagnosis not present

## 2018-12-21 DIAGNOSIS — E1122 Type 2 diabetes mellitus with diabetic chronic kidney disease: Secondary | ICD-10-CM | POA: Diagnosis not present

## 2018-12-21 DIAGNOSIS — I87303 Chronic venous hypertension (idiopathic) without complications of bilateral lower extremity: Secondary | ICD-10-CM | POA: Diagnosis not present

## 2018-12-21 DIAGNOSIS — Z4800 Encounter for change or removal of nonsurgical wound dressing: Secondary | ICD-10-CM | POA: Diagnosis not present

## 2018-12-21 NOTE — Telephone Encounter (Signed)
[facility] states pt is still establish with agency for wound care. Faxed copy of Dr. Leeanne Rio 12/20/2018 8:21pm orders.

## 2018-12-21 NOTE — Telephone Encounter (Signed)
-----   Message from Landis Martins, [place] sent at 12/20/2018  8:21 PM EDT ----- Regarding: Home Health Orders [facility] Left plantar wound is healed over. Continue with monitoring and applying unna boots to legs for PVD with history of ulcer like previous -Dr. Cannon Kettle

## 2018-12-22 DIAGNOSIS — N3000 Acute cystitis without hematuria: Secondary | ICD-10-CM | POA: Diagnosis not present

## 2018-12-25 DIAGNOSIS — Z4800 Encounter for change or removal of nonsurgical wound dressing: Secondary | ICD-10-CM | POA: Diagnosis not present

## 2018-12-25 DIAGNOSIS — I129 Hypertensive chronic kidney disease with stage 1 through stage 4 chronic kidney disease, or unspecified chronic kidney disease: Secondary | ICD-10-CM | POA: Diagnosis not present

## 2018-12-25 DIAGNOSIS — N183 Chronic kidney disease, stage 3 (moderate): Secondary | ICD-10-CM | POA: Diagnosis not present

## 2018-12-25 DIAGNOSIS — E1142 Type 2 diabetes mellitus with diabetic polyneuropathy: Secondary | ICD-10-CM | POA: Diagnosis not present

## 2018-12-25 DIAGNOSIS — I87303 Chronic venous hypertension (idiopathic) without complications of bilateral lower extremity: Secondary | ICD-10-CM | POA: Diagnosis not present

## 2018-12-25 DIAGNOSIS — E1122 Type 2 diabetes mellitus with diabetic chronic kidney disease: Secondary | ICD-10-CM | POA: Diagnosis not present

## 2018-12-27 DIAGNOSIS — Z4800 Encounter for change or removal of nonsurgical wound dressing: Secondary | ICD-10-CM | POA: Diagnosis not present

## 2018-12-27 DIAGNOSIS — E1122 Type 2 diabetes mellitus with diabetic chronic kidney disease: Secondary | ICD-10-CM | POA: Diagnosis not present

## 2018-12-27 DIAGNOSIS — I129 Hypertensive chronic kidney disease with stage 1 through stage 4 chronic kidney disease, or unspecified chronic kidney disease: Secondary | ICD-10-CM | POA: Diagnosis not present

## 2018-12-27 DIAGNOSIS — I87303 Chronic venous hypertension (idiopathic) without complications of bilateral lower extremity: Secondary | ICD-10-CM | POA: Diagnosis not present

## 2018-12-27 DIAGNOSIS — N183 Chronic kidney disease, stage 3 (moderate): Secondary | ICD-10-CM | POA: Diagnosis not present

## 2018-12-27 DIAGNOSIS — E1142 Type 2 diabetes mellitus with diabetic polyneuropathy: Secondary | ICD-10-CM | POA: Diagnosis not present

## 2018-12-28 DIAGNOSIS — E1142 Type 2 diabetes mellitus with diabetic polyneuropathy: Secondary | ICD-10-CM | POA: Diagnosis not present

## 2018-12-28 DIAGNOSIS — I129 Hypertensive chronic kidney disease with stage 1 through stage 4 chronic kidney disease, or unspecified chronic kidney disease: Secondary | ICD-10-CM | POA: Diagnosis not present

## 2018-12-28 DIAGNOSIS — I87303 Chronic venous hypertension (idiopathic) without complications of bilateral lower extremity: Secondary | ICD-10-CM | POA: Diagnosis not present

## 2018-12-28 DIAGNOSIS — Z4800 Encounter for change or removal of nonsurgical wound dressing: Secondary | ICD-10-CM | POA: Diagnosis not present

## 2018-12-28 DIAGNOSIS — N183 Chronic kidney disease, stage 3 (moderate): Secondary | ICD-10-CM | POA: Diagnosis not present

## 2018-12-28 DIAGNOSIS — E1122 Type 2 diabetes mellitus with diabetic chronic kidney disease: Secondary | ICD-10-CM | POA: Diagnosis not present

## 2019-01-01 DIAGNOSIS — I87303 Chronic venous hypertension (idiopathic) without complications of bilateral lower extremity: Secondary | ICD-10-CM | POA: Diagnosis not present

## 2019-01-01 DIAGNOSIS — E1122 Type 2 diabetes mellitus with diabetic chronic kidney disease: Secondary | ICD-10-CM | POA: Diagnosis not present

## 2019-01-01 DIAGNOSIS — Z4800 Encounter for change or removal of nonsurgical wound dressing: Secondary | ICD-10-CM | POA: Diagnosis not present

## 2019-01-01 DIAGNOSIS — E1142 Type 2 diabetes mellitus with diabetic polyneuropathy: Secondary | ICD-10-CM | POA: Diagnosis not present

## 2019-01-01 DIAGNOSIS — I129 Hypertensive chronic kidney disease with stage 1 through stage 4 chronic kidney disease, or unspecified chronic kidney disease: Secondary | ICD-10-CM | POA: Diagnosis not present

## 2019-01-01 DIAGNOSIS — N183 Chronic kidney disease, stage 3 (moderate): Secondary | ICD-10-CM | POA: Diagnosis not present

## 2019-01-03 DIAGNOSIS — I87303 Chronic venous hypertension (idiopathic) without complications of bilateral lower extremity: Secondary | ICD-10-CM | POA: Diagnosis not present

## 2019-01-03 DIAGNOSIS — E1122 Type 2 diabetes mellitus with diabetic chronic kidney disease: Secondary | ICD-10-CM | POA: Diagnosis not present

## 2019-01-03 DIAGNOSIS — I129 Hypertensive chronic kidney disease with stage 1 through stage 4 chronic kidney disease, or unspecified chronic kidney disease: Secondary | ICD-10-CM | POA: Diagnosis not present

## 2019-01-03 DIAGNOSIS — N183 Chronic kidney disease, stage 3 (moderate): Secondary | ICD-10-CM | POA: Diagnosis not present

## 2019-01-03 DIAGNOSIS — E1142 Type 2 diabetes mellitus with diabetic polyneuropathy: Secondary | ICD-10-CM | POA: Diagnosis not present

## 2019-01-03 DIAGNOSIS — Z4800 Encounter for change or removal of nonsurgical wound dressing: Secondary | ICD-10-CM | POA: Diagnosis not present

## 2019-01-04 DIAGNOSIS — S0083XA Contusion of other part of head, initial encounter: Secondary | ICD-10-CM | POA: Diagnosis not present

## 2019-01-04 DIAGNOSIS — S3993XA Unspecified injury of pelvis, initial encounter: Secondary | ICD-10-CM | POA: Diagnosis not present

## 2019-01-04 DIAGNOSIS — M79641 Pain in right hand: Secondary | ICD-10-CM | POA: Diagnosis not present

## 2019-01-04 DIAGNOSIS — S0990XA Unspecified injury of head, initial encounter: Secondary | ICD-10-CM | POA: Diagnosis not present

## 2019-01-04 DIAGNOSIS — S0510XA Contusion of eyeball and orbital tissues, unspecified eye, initial encounter: Secondary | ICD-10-CM | POA: Diagnosis not present

## 2019-01-04 DIAGNOSIS — I87303 Chronic venous hypertension (idiopathic) without complications of bilateral lower extremity: Secondary | ICD-10-CM | POA: Diagnosis not present

## 2019-01-04 DIAGNOSIS — S0181XA Laceration without foreign body of other part of head, initial encounter: Secondary | ICD-10-CM | POA: Diagnosis not present

## 2019-01-04 DIAGNOSIS — I129 Hypertensive chronic kidney disease with stage 1 through stage 4 chronic kidney disease, or unspecified chronic kidney disease: Secondary | ICD-10-CM | POA: Diagnosis not present

## 2019-01-04 DIAGNOSIS — E1142 Type 2 diabetes mellitus with diabetic polyneuropathy: Secondary | ICD-10-CM | POA: Diagnosis not present

## 2019-01-04 DIAGNOSIS — E1122 Type 2 diabetes mellitus with diabetic chronic kidney disease: Secondary | ICD-10-CM | POA: Diagnosis not present

## 2019-01-04 DIAGNOSIS — S6992XA Unspecified injury of left wrist, hand and finger(s), initial encounter: Secondary | ICD-10-CM | POA: Diagnosis not present

## 2019-01-04 DIAGNOSIS — Z4800 Encounter for change or removal of nonsurgical wound dressing: Secondary | ICD-10-CM | POA: Diagnosis not present

## 2019-01-04 DIAGNOSIS — S199XXA Unspecified injury of neck, initial encounter: Secondary | ICD-10-CM | POA: Diagnosis not present

## 2019-01-04 DIAGNOSIS — N183 Chronic kidney disease, stage 3 (moderate): Secondary | ICD-10-CM | POA: Diagnosis not present

## 2019-01-09 DIAGNOSIS — I129 Hypertensive chronic kidney disease with stage 1 through stage 4 chronic kidney disease, or unspecified chronic kidney disease: Secondary | ICD-10-CM | POA: Diagnosis not present

## 2019-01-09 DIAGNOSIS — N183 Chronic kidney disease, stage 3 (moderate): Secondary | ICD-10-CM | POA: Diagnosis not present

## 2019-01-09 DIAGNOSIS — Z4800 Encounter for change or removal of nonsurgical wound dressing: Secondary | ICD-10-CM | POA: Diagnosis not present

## 2019-01-09 DIAGNOSIS — I87303 Chronic venous hypertension (idiopathic) without complications of bilateral lower extremity: Secondary | ICD-10-CM | POA: Diagnosis not present

## 2019-01-09 DIAGNOSIS — E1142 Type 2 diabetes mellitus with diabetic polyneuropathy: Secondary | ICD-10-CM | POA: Diagnosis not present

## 2019-01-09 DIAGNOSIS — E1122 Type 2 diabetes mellitus with diabetic chronic kidney disease: Secondary | ICD-10-CM | POA: Diagnosis not present

## 2019-01-10 ENCOUNTER — Ambulatory Visit (INDEPENDENT_AMBULATORY_CARE_PROVIDER_SITE_OTHER): Payer: Medicare Other | Admitting: Sports Medicine

## 2019-01-10 ENCOUNTER — Other Ambulatory Visit: Payer: Self-pay

## 2019-01-10 ENCOUNTER — Encounter: Payer: Self-pay | Admitting: Sports Medicine

## 2019-01-10 DIAGNOSIS — E1142 Type 2 diabetes mellitus with diabetic polyneuropathy: Secondary | ICD-10-CM

## 2019-01-10 DIAGNOSIS — I739 Peripheral vascular disease, unspecified: Secondary | ICD-10-CM

## 2019-01-10 DIAGNOSIS — L97521 Non-pressure chronic ulcer of other part of left foot limited to breakdown of skin: Secondary | ICD-10-CM
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
## 2019-01-11 ENCOUNTER — Telehealth: Payer: Self-pay | Admitting: *Deleted

## 2019-01-11 DIAGNOSIS — Z4800 Encounter for change or removal of nonsurgical wound dressing: Secondary | ICD-10-CM | POA: Diagnosis not present

## 2019-01-11 DIAGNOSIS — E1122 Type 2 diabetes mellitus with diabetic chronic kidney disease: Secondary | ICD-10-CM | POA: Diagnosis not present

## 2019-01-11 DIAGNOSIS — R600 Localized edema: Secondary | ICD-10-CM | POA: Diagnosis not present

## 2019-01-11 DIAGNOSIS — I129 Hypertensive chronic kidney disease with stage 1 through stage 4 chronic kidney disease, or unspecified chronic kidney disease: Secondary | ICD-10-CM | POA: Diagnosis not present

## 2019-01-11 DIAGNOSIS — Z6831 Body mass index (BMI) 31.0-31.9, adult: Secondary | ICD-10-CM | POA: Diagnosis not present

## 2019-01-11 DIAGNOSIS — G8929 Other chronic pain: Secondary | ICD-10-CM | POA: Diagnosis not present

## 2019-01-11 DIAGNOSIS — S0083XD Contusion of other part of head, subsequent encounter: Secondary | ICD-10-CM | POA: Diagnosis not present

## 2019-01-11 DIAGNOSIS — I87303 Chronic venous hypertension (idiopathic) without complications of bilateral lower extremity: Secondary | ICD-10-CM | POA: Diagnosis not present

## 2019-01-11 DIAGNOSIS — N183 Chronic kidney disease, stage 3 (moderate): Secondary | ICD-10-CM | POA: Diagnosis not present

## 2019-01-11 DIAGNOSIS — E1142 Type 2 diabetes mellitus with diabetic polyneuropathy: Secondary | ICD-10-CM | POA: Diagnosis not present

## 2019-01-11 NOTE — Telephone Encounter (Addendum)
Faxed copy of Dr. Leeanne Rio 01/10/2019 8:45pm orders stating wound is closed and to contact me with the Lymphedema therapies focusing on Lymphedema pumps, faxed to [facility].

## 2019-01-11 NOTE — Telephone Encounter (Signed)
-----   Message from Landis Martins, [place] sent at 01/10/2019  8:45 PM EDT ----- Regarding: Home nursing orders Wound is healed Patient may benefit from edema pumps

## 2019-01-11 NOTE — Telephone Encounter (Deleted)
-----   Message from Titorya Stover, DPM sent at 01/10/2019  8:45 PM EDT ----- Regarding: Home nursing orders Wound is healed Patient may benefit from edema pumps  

## 2019-01-14 DIAGNOSIS — Z7982 Long term (current) use of aspirin: Secondary | ICD-10-CM | POA: Diagnosis not present

## 2019-01-14 DIAGNOSIS — E1122 Type 2 diabetes mellitus with diabetic chronic kidney disease: Secondary | ICD-10-CM | POA: Diagnosis not present

## 2019-01-14 DIAGNOSIS — I6930 Unspecified sequelae of cerebral infarction: Secondary | ICD-10-CM | POA: Diagnosis not present

## 2019-01-14 DIAGNOSIS — I87303 Chronic venous hypertension (idiopathic) without complications of bilateral lower extremity: Secondary | ICD-10-CM | POA: Diagnosis not present

## 2019-01-14 DIAGNOSIS — Z86718 Personal history of other venous thrombosis and embolism: Secondary | ICD-10-CM | POA: Diagnosis not present

## 2019-01-14 DIAGNOSIS — S61401A Unspecified open wound of right hand, initial encounter: Secondary | ICD-10-CM | POA: Diagnosis not present

## 2019-01-14 DIAGNOSIS — M0609 Rheumatoid arthritis without rheumatoid factor, multiple sites: Secondary | ICD-10-CM | POA: Diagnosis not present

## 2019-01-14 DIAGNOSIS — G250 Essential tremor: Secondary | ICD-10-CM | POA: Diagnosis not present

## 2019-01-14 DIAGNOSIS — E039 Hypothyroidism, unspecified: Secondary | ICD-10-CM | POA: Diagnosis not present

## 2019-01-14 DIAGNOSIS — Z79891 Long term (current) use of opiate analgesic: Secondary | ICD-10-CM | POA: Diagnosis not present

## 2019-01-14 DIAGNOSIS — E782 Mixed hyperlipidemia: Secondary | ICD-10-CM | POA: Diagnosis not present

## 2019-01-14 DIAGNOSIS — E1142 Type 2 diabetes mellitus with diabetic polyneuropathy: Secondary | ICD-10-CM | POA: Diagnosis not present

## 2019-01-14 DIAGNOSIS — E11621 Type 2 diabetes mellitus with foot ulcer: Secondary | ICD-10-CM | POA: Diagnosis not present

## 2019-01-14 DIAGNOSIS — Z602 Problems related to living alone: Secondary | ICD-10-CM | POA: Diagnosis not present

## 2019-01-14 DIAGNOSIS — N183 Chronic kidney disease, stage 3 (moderate): Secondary | ICD-10-CM | POA: Diagnosis not present

## 2019-01-14 DIAGNOSIS — Z794 Long term (current) use of insulin: Secondary | ICD-10-CM | POA: Diagnosis not present

## 2019-01-14 DIAGNOSIS — I129 Hypertensive chronic kidney disease with stage 1 through stage 4 chronic kidney disease, or unspecified chronic kidney disease: Secondary | ICD-10-CM | POA: Diagnosis not present

## 2019-01-14 DIAGNOSIS — S61402A Unspecified open wound of left hand, initial encounter: Secondary | ICD-10-CM | POA: Diagnosis not present

## 2019-01-14 DIAGNOSIS — Z9049 Acquired absence of other specified parts of digestive tract: Secondary | ICD-10-CM | POA: Diagnosis not present

## 2019-01-14 DIAGNOSIS — G8929 Other chronic pain: Secondary | ICD-10-CM | POA: Diagnosis not present

## 2019-01-14 DIAGNOSIS — Z96653 Presence of artificial knee joint, bilateral: Secondary | ICD-10-CM | POA: Diagnosis not present

## 2019-01-14 DIAGNOSIS — L97521 Non-pressure chronic ulcer of other part of left foot limited to breakdown of skin: Secondary | ICD-10-CM | POA: Diagnosis not present

## 2019-01-14 DIAGNOSIS — Z7952 Long term (current) use of systemic steroids: Secondary | ICD-10-CM | POA: Diagnosis not present

## 2019-01-16 DIAGNOSIS — E1122 Type 2 diabetes mellitus with diabetic chronic kidney disease: Secondary | ICD-10-CM | POA: Diagnosis not present

## 2019-01-16 DIAGNOSIS — I87303 Chronic venous hypertension (idiopathic) without complications of bilateral lower extremity: Secondary | ICD-10-CM | POA: Diagnosis not present

## 2019-01-16 DIAGNOSIS — S61401A Unspecified open wound of right hand, initial encounter: Secondary | ICD-10-CM | POA: Diagnosis not present

## 2019-01-16 DIAGNOSIS — E11621 Type 2 diabetes mellitus with foot ulcer: Secondary | ICD-10-CM | POA: Diagnosis not present

## 2019-01-16 DIAGNOSIS — S61402A Unspecified open wound of left hand, initial encounter: Secondary | ICD-10-CM | POA: Diagnosis not present

## 2019-01-16 DIAGNOSIS — L97521 Non-pressure chronic ulcer of other part of left foot limited to breakdown of skin: Secondary | ICD-10-CM | POA: Diagnosis not present

## 2019-01-19 DIAGNOSIS — S61402A Unspecified open wound of left hand, initial encounter: Secondary | ICD-10-CM | POA: Diagnosis not present

## 2019-01-19 DIAGNOSIS — E1122 Type 2 diabetes mellitus with diabetic chronic kidney disease: Secondary | ICD-10-CM | POA: Diagnosis not present

## 2019-01-19 DIAGNOSIS — I87303 Chronic venous hypertension (idiopathic) without complications of bilateral lower extremity: Secondary | ICD-10-CM | POA: Diagnosis not present

## 2019-01-19 DIAGNOSIS — E11621 Type 2 diabetes mellitus with foot ulcer: Secondary | ICD-10-CM | POA: Diagnosis not present

## 2019-01-19 DIAGNOSIS — S61401A Unspecified open wound of right hand, initial encounter: Secondary | ICD-10-CM | POA: Diagnosis not present

## 2019-01-19 DIAGNOSIS — L97521 Non-pressure chronic ulcer of other part of left foot limited to breakdown of skin: Secondary | ICD-10-CM | POA: Diagnosis not present

## 2019-01-22 DIAGNOSIS — S61402A Unspecified open wound of left hand, initial encounter: Secondary | ICD-10-CM | POA: Diagnosis not present

## 2019-01-22 DIAGNOSIS — S61401A Unspecified open wound of right hand, initial encounter: Secondary | ICD-10-CM | POA: Diagnosis not present

## 2019-01-22 DIAGNOSIS — E1122 Type 2 diabetes mellitus with diabetic chronic kidney disease: Secondary | ICD-10-CM | POA: Diagnosis not present

## 2019-01-22 DIAGNOSIS — L97521 Non-pressure chronic ulcer of other part of left foot limited to breakdown of skin: Secondary | ICD-10-CM | POA: Diagnosis not present

## 2019-01-22 DIAGNOSIS — I87303 Chronic venous hypertension (idiopathic) without complications of bilateral lower extremity: Secondary | ICD-10-CM | POA: Diagnosis not present

## 2019-01-22 DIAGNOSIS — E11621 Type 2 diabetes mellitus with foot ulcer: Secondary | ICD-10-CM | POA: Diagnosis not present

## 2019-01-23 DIAGNOSIS — N183 Chronic kidney disease, stage 3 (moderate): Secondary | ICD-10-CM | POA: Diagnosis not present

## 2019-01-23 DIAGNOSIS — E1165 Type 2 diabetes mellitus with hyperglycemia: Secondary | ICD-10-CM | POA: Diagnosis not present

## 2019-01-23 DIAGNOSIS — G8929 Other chronic pain: Secondary | ICD-10-CM | POA: Diagnosis not present

## 2019-01-23 DIAGNOSIS — E1140 Type 2 diabetes mellitus with diabetic neuropathy, unspecified: Secondary | ICD-10-CM | POA: Diagnosis not present

## 2019-01-24 DIAGNOSIS — E11621 Type 2 diabetes mellitus with foot ulcer: Secondary | ICD-10-CM | POA: Diagnosis not present

## 2019-01-25 DIAGNOSIS — E11621 Type 2 diabetes mellitus with foot ulcer: Secondary | ICD-10-CM | POA: Diagnosis not present

## 2019-01-25 DIAGNOSIS — L97521 Non-pressure chronic ulcer of other part of left foot limited to breakdown of skin: Secondary | ICD-10-CM | POA: Diagnosis not present

## 2019-01-25 DIAGNOSIS — S61402A Unspecified open wound of left hand, initial encounter: Secondary | ICD-10-CM | POA: Diagnosis not present

## 2019-01-25 DIAGNOSIS — I87303 Chronic venous hypertension (idiopathic) without complications of bilateral lower extremity: Secondary | ICD-10-CM | POA: Diagnosis not present

## 2019-01-25 DIAGNOSIS — E1122 Type 2 diabetes mellitus with diabetic chronic kidney disease: Secondary | ICD-10-CM | POA: Diagnosis not present

## 2019-01-25 DIAGNOSIS — S61401A Unspecified open wound of right hand, initial encounter: Secondary | ICD-10-CM | POA: Diagnosis not present

## 2019-01-29 DIAGNOSIS — M0579 Rheumatoid arthritis with rheumatoid factor of multiple sites without organ or systems involvement: Secondary | ICD-10-CM | POA: Diagnosis not present

## 2019-01-29 DIAGNOSIS — J90 Pleural effusion, not elsewhere classified: Secondary | ICD-10-CM | POA: Diagnosis not present

## 2019-01-29 DIAGNOSIS — K118 Other diseases of salivary glands: Secondary | ICD-10-CM | POA: Diagnosis not present

## 2019-01-29 DIAGNOSIS — G5600 Carpal tunnel syndrome, unspecified upper limb: Secondary | ICD-10-CM | POA: Diagnosis not present

## 2019-01-29 DIAGNOSIS — R200 Anesthesia of skin: Secondary | ICD-10-CM | POA: Diagnosis not present

## 2019-01-29 DIAGNOSIS — Z8709 Personal history of other diseases of the respiratory system: Secondary | ICD-10-CM | POA: Diagnosis not present

## 2019-01-29 DIAGNOSIS — M3500 Sicca syndrome, unspecified: Secondary | ICD-10-CM | POA: Diagnosis not present

## 2019-01-29 DIAGNOSIS — D809 Immunodeficiency with predominantly antibody defects, unspecified: Secondary | ICD-10-CM | POA: Diagnosis not present

## 2019-01-29 DIAGNOSIS — M8580 Other specified disorders of bone density and structure, unspecified site: Secondary | ICD-10-CM | POA: Diagnosis not present

## 2019-01-29 DIAGNOSIS — M059 Rheumatoid arthritis with rheumatoid factor, unspecified: Secondary | ICD-10-CM | POA: Diagnosis not present

## 2019-01-30 DIAGNOSIS — S61402A Unspecified open wound of left hand, initial encounter: Secondary | ICD-10-CM | POA: Diagnosis not present

## 2019-01-30 DIAGNOSIS — E11621 Type 2 diabetes mellitus with foot ulcer: Secondary | ICD-10-CM | POA: Diagnosis not present

## 2019-01-30 DIAGNOSIS — I87303 Chronic venous hypertension (idiopathic) without complications of bilateral lower extremity: Secondary | ICD-10-CM | POA: Diagnosis not present

## 2019-01-30 DIAGNOSIS — L97521 Non-pressure chronic ulcer of other part of left foot limited to breakdown of skin: Secondary | ICD-10-CM | POA: Diagnosis not present

## 2019-01-30 DIAGNOSIS — E1122 Type 2 diabetes mellitus with diabetic chronic kidney disease: Secondary | ICD-10-CM | POA: Diagnosis not present

## 2019-01-30 DIAGNOSIS — S61401A Unspecified open wound of right hand, initial encounter: Secondary | ICD-10-CM | POA: Diagnosis not present

## 2019-01-31 ENCOUNTER — Ambulatory Visit (INDEPENDENT_AMBULATORY_CARE_PROVIDER_SITE_OTHER): Payer: Medicare Other | Admitting: Sports Medicine

## 2019-01-31 ENCOUNTER — Other Ambulatory Visit: Payer: Self-pay

## 2019-01-31 ENCOUNTER — Encounter: Payer: Self-pay | Admitting: Sports Medicine

## 2019-01-31 DIAGNOSIS — I739 Peripheral vascular disease, unspecified: Secondary | ICD-10-CM

## 2019-01-31 DIAGNOSIS — L97521 Non-pressure chronic ulcer of other part of left foot limited to breakdown of skin: Secondary | ICD-10-CM | POA: Diagnosis not present

## 2019-01-31 DIAGNOSIS — E1142 Type 2 diabetes mellitus with diabetic polyneuropathy: Secondary | ICD-10-CM
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
## 2019-02-01 ENCOUNTER — Telehealth: Payer: Self-pay | Admitting: *Deleted

## 2019-02-01 DIAGNOSIS — L97521 Non-pressure chronic ulcer of other part of left foot limited to breakdown of skin: Secondary | ICD-10-CM | POA: Diagnosis not present

## 2019-02-01 DIAGNOSIS — E1122 Type 2 diabetes mellitus with diabetic chronic kidney disease: Secondary | ICD-10-CM | POA: Diagnosis not present

## 2019-02-01 DIAGNOSIS — S61402A Unspecified open wound of left hand, initial encounter: Secondary | ICD-10-CM | POA: Diagnosis not present

## 2019-02-01 DIAGNOSIS — E11621 Type 2 diabetes mellitus with foot ulcer: Secondary | ICD-10-CM | POA: Diagnosis not present

## 2019-02-01 DIAGNOSIS — S61401A Unspecified open wound of right hand, initial encounter: Secondary | ICD-10-CM | POA: Diagnosis not present

## 2019-02-01 DIAGNOSIS — I87303 Chronic venous hypertension (idiopathic) without complications of bilateral lower extremity: Secondary | ICD-10-CM | POA: Diagnosis not present

## 2019-02-01 NOTE — Telephone Encounter (Signed)
-----   Message from Landis Martins, [place] sent at 01/31/2019  8:41 PM EDT ----- Regarding: Home nursing Recommend continue with Unna boots bilateral to the knees if there is any drainage or bleeding from the callus lesion at the plantar aspect of the left foot please apply antibiotic powder cover it with gauze to the area and then the Unna boot over the top.  Recommend to continue with Unna boots until her lower extremity swelling has improved.  Will discuss with PCP possible need for edema pumps

## 2019-02-01 NOTE — Telephone Encounter (Signed)
Dr. Leeanne Rio 01/31/2019 8:41pm orders  faxed to [facility].

## 2019-02-06 DIAGNOSIS — S61402A Unspecified open wound of left hand, initial encounter: Secondary | ICD-10-CM | POA: Diagnosis not present

## 2019-02-06 DIAGNOSIS — S61401A Unspecified open wound of right hand, initial encounter: Secondary | ICD-10-CM | POA: Diagnosis not present

## 2019-02-06 DIAGNOSIS — E11621 Type 2 diabetes mellitus with foot ulcer: Secondary | ICD-10-CM | POA: Diagnosis not present

## 2019-02-06 DIAGNOSIS — I87303 Chronic venous hypertension (idiopathic) without complications of bilateral lower extremity: Secondary | ICD-10-CM | POA: Diagnosis not present

## 2019-02-06 DIAGNOSIS — L97521 Non-pressure chronic ulcer of other part of left foot limited to breakdown of skin: Secondary | ICD-10-CM | POA: Diagnosis not present

## 2019-02-06 DIAGNOSIS — E1122 Type 2 diabetes mellitus with diabetic chronic kidney disease: Secondary | ICD-10-CM | POA: Diagnosis not present

## 2019-02-07 DIAGNOSIS — Z23 Encounter for immunization: Secondary | ICD-10-CM | POA: Diagnosis not present

## 2019-02-07 DIAGNOSIS — S0083XD Contusion of other part of head, subsequent encounter: Secondary | ICD-10-CM | POA: Diagnosis not present

## 2019-02-07 DIAGNOSIS — E1165 Type 2 diabetes mellitus with hyperglycemia: Secondary | ICD-10-CM | POA: Diagnosis not present

## 2019-02-07 DIAGNOSIS — E876 Hypokalemia: Secondary | ICD-10-CM | POA: Diagnosis not present

## 2019-02-07 DIAGNOSIS — I87303 Chronic venous hypertension (idiopathic) without complications of bilateral lower extremity: Secondary | ICD-10-CM | POA: Diagnosis not present

## 2019-02-07 DIAGNOSIS — E1122 Type 2 diabetes mellitus with diabetic chronic kidney disease: Secondary | ICD-10-CM | POA: Diagnosis not present

## 2019-02-07 DIAGNOSIS — E11621 Type 2 diabetes mellitus with foot ulcer: Secondary | ICD-10-CM | POA: Diagnosis not present

## 2019-02-07 DIAGNOSIS — L97521 Non-pressure chronic ulcer of other part of left foot limited to breakdown of skin: Secondary | ICD-10-CM | POA: Diagnosis not present

## 2019-02-07 DIAGNOSIS — E1140 Type 2 diabetes mellitus with diabetic neuropathy, unspecified: Secondary | ICD-10-CM | POA: Diagnosis not present

## 2019-02-07 DIAGNOSIS — S61401A Unspecified open wound of right hand, initial encounter: Secondary | ICD-10-CM | POA: Diagnosis not present

## 2019-02-07 DIAGNOSIS — S61402A Unspecified open wound of left hand, initial encounter: Secondary | ICD-10-CM | POA: Diagnosis not present

## 2019-02-09 DIAGNOSIS — S61402A Unspecified open wound of left hand, initial encounter: Secondary | ICD-10-CM | POA: Diagnosis not present

## 2019-02-09 DIAGNOSIS — I87303 Chronic venous hypertension (idiopathic) without complications of bilateral lower extremity: Secondary | ICD-10-CM | POA: Diagnosis not present

## 2019-02-09 DIAGNOSIS — E11621 Type 2 diabetes mellitus with foot ulcer: Secondary | ICD-10-CM | POA: Diagnosis not present

## 2019-02-09 DIAGNOSIS — L97521 Non-pressure chronic ulcer of other part of left foot limited to breakdown of skin: Secondary | ICD-10-CM | POA: Diagnosis not present

## 2019-02-09 DIAGNOSIS — E1122 Type 2 diabetes mellitus with diabetic chronic kidney disease: Secondary | ICD-10-CM | POA: Diagnosis not present

## 2019-02-09 DIAGNOSIS — S61401A Unspecified open wound of right hand, initial encounter: Secondary | ICD-10-CM | POA: Diagnosis not present

## 2019-02-13 DIAGNOSIS — E11621 Type 2 diabetes mellitus with foot ulcer: Secondary | ICD-10-CM | POA: Diagnosis not present

## 2019-02-13 DIAGNOSIS — Z86718 Personal history of other venous thrombosis and embolism: Secondary | ICD-10-CM | POA: Diagnosis not present

## 2019-02-13 DIAGNOSIS — E1142 Type 2 diabetes mellitus with diabetic polyneuropathy: Secondary | ICD-10-CM | POA: Diagnosis not present

## 2019-02-13 DIAGNOSIS — L97521 Non-pressure chronic ulcer of other part of left foot limited to breakdown of skin: Secondary | ICD-10-CM | POA: Diagnosis not present

## 2019-02-13 DIAGNOSIS — Z794 Long term (current) use of insulin: Secondary | ICD-10-CM | POA: Diagnosis not present

## 2019-02-13 DIAGNOSIS — Z96653 Presence of artificial knee joint, bilateral: Secondary | ICD-10-CM | POA: Diagnosis not present

## 2019-02-13 DIAGNOSIS — N1832 Chronic kidney disease, stage 3b: Secondary | ICD-10-CM | POA: Diagnosis not present

## 2019-02-13 DIAGNOSIS — Z7952 Long term (current) use of systemic steroids: Secondary | ICD-10-CM | POA: Diagnosis not present

## 2019-02-13 DIAGNOSIS — S61401A Unspecified open wound of right hand, initial encounter: Secondary | ICD-10-CM | POA: Diagnosis not present

## 2019-02-13 DIAGNOSIS — E039 Hypothyroidism, unspecified: Secondary | ICD-10-CM | POA: Diagnosis not present

## 2019-02-13 DIAGNOSIS — Z79891 Long term (current) use of opiate analgesic: Secondary | ICD-10-CM | POA: Diagnosis not present

## 2019-02-13 DIAGNOSIS — E1122 Type 2 diabetes mellitus with diabetic chronic kidney disease: Secondary | ICD-10-CM | POA: Diagnosis not present

## 2019-02-13 DIAGNOSIS — Z602 Problems related to living alone: Secondary | ICD-10-CM | POA: Diagnosis not present

## 2019-02-13 DIAGNOSIS — G250 Essential tremor: Secondary | ICD-10-CM | POA: Diagnosis not present

## 2019-02-13 DIAGNOSIS — I87303 Chronic venous hypertension (idiopathic) without complications of bilateral lower extremity: Secondary | ICD-10-CM | POA: Diagnosis not present

## 2019-02-13 DIAGNOSIS — Z7982 Long term (current) use of aspirin: Secondary | ICD-10-CM | POA: Diagnosis not present

## 2019-02-13 DIAGNOSIS — Z9049 Acquired absence of other specified parts of digestive tract: Secondary | ICD-10-CM | POA: Diagnosis not present

## 2019-02-13 DIAGNOSIS — E782 Mixed hyperlipidemia: Secondary | ICD-10-CM | POA: Diagnosis not present

## 2019-02-13 DIAGNOSIS — I6930 Unspecified sequelae of cerebral infarction: Secondary | ICD-10-CM | POA: Diagnosis not present

## 2019-02-13 DIAGNOSIS — I129 Hypertensive chronic kidney disease with stage 1 through stage 4 chronic kidney disease, or unspecified chronic kidney disease: Secondary | ICD-10-CM | POA: Diagnosis not present

## 2019-02-13 DIAGNOSIS — G8929 Other chronic pain: Secondary | ICD-10-CM | POA: Diagnosis not present

## 2019-02-13 DIAGNOSIS — M0609 Rheumatoid arthritis without rheumatoid factor, multiple sites: Secondary | ICD-10-CM | POA: Diagnosis not present

## 2019-02-13 DIAGNOSIS — S61402A Unspecified open wound of left hand, initial encounter: Secondary | ICD-10-CM | POA: Diagnosis not present

## 2019-02-14 DIAGNOSIS — S61401A Unspecified open wound of right hand, initial encounter: Secondary | ICD-10-CM | POA: Diagnosis not present

## 2019-02-14 DIAGNOSIS — E11621 Type 2 diabetes mellitus with foot ulcer: Secondary | ICD-10-CM | POA: Diagnosis not present

## 2019-02-14 DIAGNOSIS — L97521 Non-pressure chronic ulcer of other part of left foot limited to breakdown of skin: Secondary | ICD-10-CM | POA: Diagnosis not present

## 2019-02-14 DIAGNOSIS — E1122 Type 2 diabetes mellitus with diabetic chronic kidney disease: Secondary | ICD-10-CM | POA: Diagnosis not present

## 2019-02-14 DIAGNOSIS — I87303 Chronic venous hypertension (idiopathic) without complications of bilateral lower extremity: Secondary | ICD-10-CM | POA: Diagnosis not present

## 2019-02-14 DIAGNOSIS — S61402A Unspecified open wound of left hand, initial encounter: Secondary | ICD-10-CM | POA: Diagnosis not present

## 2019-02-20 DIAGNOSIS — L97521 Non-pressure chronic ulcer of other part of left foot limited to breakdown of skin: Secondary | ICD-10-CM | POA: Diagnosis not present

## 2019-02-20 DIAGNOSIS — S61401A Unspecified open wound of right hand, initial encounter: Secondary | ICD-10-CM | POA: Diagnosis not present

## 2019-02-20 DIAGNOSIS — S61402A Unspecified open wound of left hand, initial encounter: Secondary | ICD-10-CM | POA: Diagnosis not present

## 2019-02-20 DIAGNOSIS — E1122 Type 2 diabetes mellitus with diabetic chronic kidney disease: Secondary | ICD-10-CM | POA: Diagnosis not present

## 2019-02-20 DIAGNOSIS — E11621 Type 2 diabetes mellitus with foot ulcer: Secondary | ICD-10-CM | POA: Diagnosis not present

## 2019-02-20 DIAGNOSIS — I87303 Chronic venous hypertension (idiopathic) without complications of bilateral lower extremity: Secondary | ICD-10-CM | POA: Diagnosis not present

## 2019-02-22 DIAGNOSIS — K8689 Other specified diseases of pancreas: Secondary | ICD-10-CM | POA: Diagnosis not present

## 2019-02-22 DIAGNOSIS — S13120D Subluxation of C1/C2 cervical vertebrae, subsequent encounter: Secondary | ICD-10-CM | POA: Diagnosis not present

## 2019-02-23 DIAGNOSIS — Z20828 Contact with and (suspected) exposure to other viral communicable diseases: Secondary | ICD-10-CM | POA: Diagnosis not present

## 2019-02-23 DIAGNOSIS — K8689 Other specified diseases of pancreas: Secondary | ICD-10-CM | POA: Diagnosis not present

## 2019-02-23 DIAGNOSIS — Z01812 Encounter for preprocedural laboratory examination: Secondary | ICD-10-CM | POA: Diagnosis not present

## 2019-02-27 DIAGNOSIS — S61402A Unspecified open wound of left hand, initial encounter: Secondary | ICD-10-CM | POA: Diagnosis not present

## 2019-02-27 DIAGNOSIS — L97521 Non-pressure chronic ulcer of other part of left foot limited to breakdown of skin: Secondary | ICD-10-CM | POA: Diagnosis not present

## 2019-02-27 DIAGNOSIS — E1122 Type 2 diabetes mellitus with diabetic chronic kidney disease: Secondary | ICD-10-CM | POA: Diagnosis not present

## 2019-02-27 DIAGNOSIS — E11621 Type 2 diabetes mellitus with foot ulcer: Secondary | ICD-10-CM | POA: Diagnosis not present

## 2019-02-27 DIAGNOSIS — I87303 Chronic venous hypertension (idiopathic) without complications of bilateral lower extremity: Secondary | ICD-10-CM | POA: Diagnosis not present

## 2019-02-27 DIAGNOSIS — S61401A Unspecified open wound of right hand, initial encounter: Secondary | ICD-10-CM | POA: Diagnosis not present

## 2019-02-28 ENCOUNTER — Encounter: Payer: Self-pay | Admitting: Sports Medicine

## 2019-02-28 ENCOUNTER — Ambulatory Visit (INDEPENDENT_AMBULATORY_CARE_PROVIDER_SITE_OTHER): Payer: Medicare Other | Admitting: Sports Medicine

## 2019-02-28 ENCOUNTER — Other Ambulatory Visit: Payer: Self-pay

## 2019-02-28 DIAGNOSIS — I739 Peripheral vascular disease, unspecified: Secondary | ICD-10-CM

## 2019-02-28 DIAGNOSIS — L97521 Non-pressure chronic ulcer of other part of left foot limited to breakdown of skin: Secondary | ICD-10-CM

## 2019-02-28 DIAGNOSIS — E1142 Type 2 diabetes mellitus with diabetic polyneuropathy: Secondary | ICD-10-CM
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
## 2019-03-02 DIAGNOSIS — K8689 Other specified diseases of pancreas: Secondary | ICD-10-CM | POA: Diagnosis not present

## 2019-03-02 DIAGNOSIS — Z01818 Encounter for other preprocedural examination: Secondary | ICD-10-CM | POA: Diagnosis not present

## 2019-03-03 LAB — WOUND CULTURE

## 2019-03-04 ENCOUNTER — Other Ambulatory Visit: Payer: Self-pay | Admitting: Sports Medicine

## 2019-03-04 MED ORDER — FLUCONAZOLE 150 MG PO TABS: 150.0000 mg | ORAL_TABLET | Freq: Every day | ORAL | 0 refills | Status: AC

## 2019-03-04 NOTE — Progress Notes (Signed)
Sent Diflucan for yeast on wound culture for 1 week -Dr. Cannon Kettle

## 2019-03-05 ENCOUNTER — Telehealth: Payer: Self-pay

## 2019-03-05 NOTE — Telephone Encounter (Signed)
Spoke with Pt about wound cx results, Rx and the Dr's recommendations. Pt stated understanding and pt was told to give [place]  A call if any issues or changed in wound

## 2019-03-05 NOTE — Telephone Encounter (Signed)
-----   Message from Landis Martins, [place] sent at 03/04/2019 10:40 AM EST ----- Assunta Found Will you let patient know that I sent Diflucan to her pharmacy for her to take one tablet daily for 1 week for + yeast on wound culture Thanks Dr. Cannon Kettle

## 2019-03-06 DIAGNOSIS — S61401A Unspecified open wound of right hand, initial encounter: Secondary | ICD-10-CM | POA: Diagnosis not present

## 2019-03-06 DIAGNOSIS — I87303 Chronic venous hypertension (idiopathic) without complications of bilateral lower extremity: Secondary | ICD-10-CM | POA: Diagnosis not present

## 2019-03-06 DIAGNOSIS — E11621 Type 2 diabetes mellitus with foot ulcer: Secondary | ICD-10-CM | POA: Diagnosis not present

## 2019-03-06 DIAGNOSIS — E1122 Type 2 diabetes mellitus with diabetic chronic kidney disease: Secondary | ICD-10-CM | POA: Diagnosis not present

## 2019-03-06 DIAGNOSIS — S61402A Unspecified open wound of left hand, initial encounter: Secondary | ICD-10-CM | POA: Diagnosis not present

## 2019-03-06 DIAGNOSIS — L97521 Non-pressure chronic ulcer of other part of left foot limited to breakdown of skin: Secondary | ICD-10-CM | POA: Diagnosis not present

## 2019-03-08 DIAGNOSIS — L97521 Non-pressure chronic ulcer of other part of left foot limited to breakdown of skin: Secondary | ICD-10-CM | POA: Diagnosis not present

## 2019-03-08 DIAGNOSIS — S61402A Unspecified open wound of left hand, initial encounter: Secondary | ICD-10-CM | POA: Diagnosis not present

## 2019-03-08 DIAGNOSIS — E11621 Type 2 diabetes mellitus with foot ulcer: Secondary | ICD-10-CM | POA: Diagnosis not present

## 2019-03-08 DIAGNOSIS — E1122 Type 2 diabetes mellitus with diabetic chronic kidney disease: Secondary | ICD-10-CM | POA: Diagnosis not present

## 2019-03-08 DIAGNOSIS — S61401A Unspecified open wound of right hand, initial encounter: Secondary | ICD-10-CM | POA: Diagnosis not present

## 2019-03-08 DIAGNOSIS — I87303 Chronic venous hypertension (idiopathic) without complications of bilateral lower extremity: Secondary | ICD-10-CM | POA: Diagnosis not present

## 2019-03-09 DIAGNOSIS — Z794 Long term (current) use of insulin: Secondary | ICD-10-CM | POA: Diagnosis not present

## 2019-03-09 DIAGNOSIS — J90 Pleural effusion, not elsewhere classified: Secondary | ICD-10-CM | POA: Diagnosis not present

## 2019-03-09 DIAGNOSIS — J984 Other disorders of lung: Secondary | ICD-10-CM | POA: Diagnosis not present

## 2019-03-09 DIAGNOSIS — Z9889 Other specified postprocedural states: Secondary | ICD-10-CM | POA: Diagnosis not present

## 2019-03-09 DIAGNOSIS — Z902 Acquired absence of lung [part of]: Secondary | ICD-10-CM | POA: Diagnosis not present

## 2019-03-09 DIAGNOSIS — K219 Gastro-esophageal reflux disease without esophagitis: Secondary | ICD-10-CM | POA: Diagnosis not present

## 2019-03-09 DIAGNOSIS — G8929 Other chronic pain: Secondary | ICD-10-CM | POA: Diagnosis not present

## 2019-03-09 DIAGNOSIS — M069 Rheumatoid arthritis, unspecified: Secondary | ICD-10-CM | POA: Diagnosis not present

## 2019-03-09 DIAGNOSIS — E119 Type 2 diabetes mellitus without complications: Secondary | ICD-10-CM | POA: Diagnosis not present

## 2019-03-09 DIAGNOSIS — I10 Essential (primary) hypertension: Secondary | ICD-10-CM | POA: Diagnosis not present

## 2019-03-09 DIAGNOSIS — Z79899 Other long term (current) drug therapy: Secondary | ICD-10-CM | POA: Diagnosis not present

## 2019-03-13 DIAGNOSIS — E11621 Type 2 diabetes mellitus with foot ulcer: Secondary | ICD-10-CM | POA: Diagnosis not present

## 2019-03-13 DIAGNOSIS — S61402A Unspecified open wound of left hand, initial encounter: Secondary | ICD-10-CM | POA: Diagnosis not present

## 2019-03-13 DIAGNOSIS — S61401A Unspecified open wound of right hand, initial encounter: Secondary | ICD-10-CM | POA: Diagnosis not present

## 2019-03-13 DIAGNOSIS — I87303 Chronic venous hypertension (idiopathic) without complications of bilateral lower extremity: Secondary | ICD-10-CM | POA: Diagnosis not present

## 2019-03-13 DIAGNOSIS — E1122 Type 2 diabetes mellitus with diabetic chronic kidney disease: Secondary | ICD-10-CM | POA: Diagnosis not present

## 2019-03-13 DIAGNOSIS — L97521 Non-pressure chronic ulcer of other part of left foot limited to breakdown of skin: Secondary | ICD-10-CM | POA: Diagnosis not present

## 2019-03-15 DIAGNOSIS — G250 Essential tremor: Secondary | ICD-10-CM | POA: Diagnosis not present

## 2019-03-15 DIAGNOSIS — Z602 Problems related to living alone: Secondary | ICD-10-CM | POA: Diagnosis not present

## 2019-03-15 DIAGNOSIS — E782 Mixed hyperlipidemia: Secondary | ICD-10-CM | POA: Diagnosis not present

## 2019-03-15 DIAGNOSIS — Z794 Long term (current) use of insulin: Secondary | ICD-10-CM | POA: Diagnosis not present

## 2019-03-15 DIAGNOSIS — Z9049 Acquired absence of other specified parts of digestive tract: Secondary | ICD-10-CM | POA: Diagnosis not present

## 2019-03-15 DIAGNOSIS — Z96653 Presence of artificial knee joint, bilateral: Secondary | ICD-10-CM | POA: Diagnosis not present

## 2019-03-15 DIAGNOSIS — G8929 Other chronic pain: Secondary | ICD-10-CM | POA: Diagnosis not present

## 2019-03-15 DIAGNOSIS — Z79891 Long term (current) use of opiate analgesic: Secondary | ICD-10-CM | POA: Diagnosis not present

## 2019-03-15 DIAGNOSIS — E039 Hypothyroidism, unspecified: Secondary | ICD-10-CM | POA: Diagnosis not present

## 2019-03-15 DIAGNOSIS — Z7982 Long term (current) use of aspirin: Secondary | ICD-10-CM | POA: Diagnosis not present

## 2019-03-15 DIAGNOSIS — Z7952 Long term (current) use of systemic steroids: Secondary | ICD-10-CM | POA: Diagnosis not present

## 2019-03-15 DIAGNOSIS — E1142 Type 2 diabetes mellitus with diabetic polyneuropathy: Secondary | ICD-10-CM | POA: Diagnosis not present

## 2019-03-15 DIAGNOSIS — E11621 Type 2 diabetes mellitus with foot ulcer: Secondary | ICD-10-CM | POA: Diagnosis not present

## 2019-03-15 DIAGNOSIS — E1122 Type 2 diabetes mellitus with diabetic chronic kidney disease: Secondary | ICD-10-CM | POA: Diagnosis not present

## 2019-03-15 DIAGNOSIS — Z86718 Personal history of other venous thrombosis and embolism: Secondary | ICD-10-CM | POA: Diagnosis not present

## 2019-03-15 DIAGNOSIS — I87303 Chronic venous hypertension (idiopathic) without complications of bilateral lower extremity: Secondary | ICD-10-CM | POA: Diagnosis not present

## 2019-03-15 DIAGNOSIS — I6930 Unspecified sequelae of cerebral infarction: Secondary | ICD-10-CM | POA: Diagnosis not present

## 2019-03-15 DIAGNOSIS — L97521 Non-pressure chronic ulcer of other part of left foot limited to breakdown of skin: Secondary | ICD-10-CM | POA: Diagnosis not present

## 2019-03-15 DIAGNOSIS — M0609 Rheumatoid arthritis without rheumatoid factor, multiple sites: Secondary | ICD-10-CM | POA: Diagnosis not present

## 2019-03-15 DIAGNOSIS — N1832 Chronic kidney disease, stage 3b: Secondary | ICD-10-CM | POA: Diagnosis not present

## 2019-03-15 DIAGNOSIS — I129 Hypertensive chronic kidney disease with stage 1 through stage 4 chronic kidney disease, or unspecified chronic kidney disease: Secondary | ICD-10-CM | POA: Diagnosis not present

## 2019-03-21 ENCOUNTER — Ambulatory Visit (INDEPENDENT_AMBULATORY_CARE_PROVIDER_SITE_OTHER): Payer: Medicare Other | Admitting: Sports Medicine

## 2019-03-21 ENCOUNTER — Other Ambulatory Visit: Payer: Self-pay

## 2019-03-21 DIAGNOSIS — E1142 Type 2 diabetes mellitus with diabetic polyneuropathy: Secondary | ICD-10-CM

## 2019-03-21 DIAGNOSIS — L97521 Non-pressure chronic ulcer of other part of left foot limited to breakdown of skin: Secondary | ICD-10-CM | POA: Diagnosis not present

## 2019-03-21 DIAGNOSIS — I739 Peripheral vascular disease, unspecified: Secondary | ICD-10-CM
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
## 2019-03-22 DIAGNOSIS — E11621 Type 2 diabetes mellitus with foot ulcer: Secondary | ICD-10-CM | POA: Diagnosis not present

## 2019-04-04 DIAGNOSIS — E11621 Type 2 diabetes mellitus with foot ulcer: Secondary | ICD-10-CM | POA: Diagnosis not present

## 2019-04-05 DIAGNOSIS — M47816 Spondylosis without myelopathy or radiculopathy, lumbar region: Secondary | ICD-10-CM | POA: Diagnosis not present

## 2019-04-05 DIAGNOSIS — K862 Cyst of pancreas: Secondary | ICD-10-CM | POA: Diagnosis not present

## 2019-04-05 DIAGNOSIS — M160 Bilateral primary osteoarthritis of hip: Secondary | ICD-10-CM | POA: Diagnosis not present

## 2019-04-05 DIAGNOSIS — M533 Sacrococcygeal disorders, not elsewhere classified: Secondary | ICD-10-CM | POA: Diagnosis not present

## 2019-04-05 DIAGNOSIS — J90 Pleural effusion, not elsewhere classified: Secondary | ICD-10-CM | POA: Diagnosis not present

## 2019-04-05 DIAGNOSIS — E1140 Type 2 diabetes mellitus with diabetic neuropathy, unspecified: Secondary | ICD-10-CM | POA: Diagnosis not present

## 2019-04-05 DIAGNOSIS — Z794 Long term (current) use of insulin: Secondary | ICD-10-CM | POA: Diagnosis not present

## 2019-04-05 DIAGNOSIS — M4316 Spondylolisthesis, lumbar region: Secondary | ICD-10-CM | POA: Diagnosis not present

## 2019-04-05 DIAGNOSIS — Z79899 Other long term (current) drug therapy: Secondary | ICD-10-CM | POA: Diagnosis not present

## 2019-04-05 DIAGNOSIS — Z7952 Long term (current) use of systemic steroids: Secondary | ICD-10-CM | POA: Diagnosis not present

## 2019-04-05 DIAGNOSIS — M8588 Other specified disorders of bone density and structure, other site: Secondary | ICD-10-CM | POA: Diagnosis not present

## 2019-04-05 DIAGNOSIS — M5137 Other intervertebral disc degeneration, lumbosacral region: Secondary | ICD-10-CM | POA: Diagnosis not present

## 2019-04-05 DIAGNOSIS — M549 Dorsalgia, unspecified: Secondary | ICD-10-CM | POA: Diagnosis not present

## 2019-04-05 DIAGNOSIS — M25432 Effusion, left wrist: Secondary | ICD-10-CM | POA: Diagnosis not present

## 2019-04-05 DIAGNOSIS — M47817 Spondylosis without myelopathy or radiculopathy, lumbosacral region: Secondary | ICD-10-CM | POA: Diagnosis not present

## 2019-04-05 DIAGNOSIS — M47818 Spondylosis without myelopathy or radiculopathy, sacral and sacrococcygeal region: Secondary | ICD-10-CM | POA: Diagnosis not present

## 2019-04-05 DIAGNOSIS — M3500 Sicca syndrome, unspecified: Secondary | ICD-10-CM | POA: Diagnosis not present

## 2019-04-05 DIAGNOSIS — M0579 Rheumatoid arthritis with rheumatoid factor of multiple sites without organ or systems involvement: Secondary | ICD-10-CM | POA: Diagnosis not present

## 2019-04-11 DIAGNOSIS — Z6832 Body mass index (BMI) 32.0-32.9, adult: Secondary | ICD-10-CM | POA: Diagnosis not present

## 2019-04-11 DIAGNOSIS — R0600 Dyspnea, unspecified: Secondary | ICD-10-CM | POA: Diagnosis not present

## 2019-04-11 DIAGNOSIS — I87303 Chronic venous hypertension (idiopathic) without complications of bilateral lower extremity: Secondary | ICD-10-CM | POA: Diagnosis not present

## 2019-04-11 DIAGNOSIS — E039 Hypothyroidism, unspecified: Secondary | ICD-10-CM | POA: Diagnosis not present

## 2019-04-12 DIAGNOSIS — H0102B Squamous blepharitis left eye, upper and lower eyelids: Secondary | ICD-10-CM | POA: Diagnosis not present

## 2019-04-12 DIAGNOSIS — H109 Unspecified conjunctivitis: Secondary | ICD-10-CM | POA: Diagnosis not present

## 2019-04-12 DIAGNOSIS — H0102A Squamous blepharitis right eye, upper and lower eyelids: Secondary | ICD-10-CM | POA: Diagnosis not present

## 2019-04-14 DIAGNOSIS — G250 Essential tremor: Secondary | ICD-10-CM | POA: Diagnosis not present

## 2019-04-14 DIAGNOSIS — E782 Mixed hyperlipidemia: Secondary | ICD-10-CM | POA: Diagnosis not present

## 2019-04-14 DIAGNOSIS — G8929 Other chronic pain: Secondary | ICD-10-CM | POA: Diagnosis not present

## 2019-04-14 DIAGNOSIS — Z794 Long term (current) use of insulin: Secondary | ICD-10-CM | POA: Diagnosis not present

## 2019-04-14 DIAGNOSIS — Z602 Problems related to living alone: Secondary | ICD-10-CM | POA: Diagnosis not present

## 2019-04-14 DIAGNOSIS — E1122 Type 2 diabetes mellitus with diabetic chronic kidney disease: Secondary | ICD-10-CM | POA: Diagnosis not present

## 2019-04-14 DIAGNOSIS — L97521 Non-pressure chronic ulcer of other part of left foot limited to breakdown of skin: Secondary | ICD-10-CM | POA: Diagnosis not present

## 2019-04-14 DIAGNOSIS — Z7982 Long term (current) use of aspirin: Secondary | ICD-10-CM | POA: Diagnosis not present

## 2019-04-14 DIAGNOSIS — Z9049 Acquired absence of other specified parts of digestive tract: Secondary | ICD-10-CM | POA: Diagnosis not present

## 2019-04-14 DIAGNOSIS — I129 Hypertensive chronic kidney disease with stage 1 through stage 4 chronic kidney disease, or unspecified chronic kidney disease: Secondary | ICD-10-CM | POA: Diagnosis not present

## 2019-04-14 DIAGNOSIS — M0609 Rheumatoid arthritis without rheumatoid factor, multiple sites: Secondary | ICD-10-CM | POA: Diagnosis not present

## 2019-04-14 DIAGNOSIS — Z96653 Presence of artificial knee joint, bilateral: Secondary | ICD-10-CM | POA: Diagnosis not present

## 2019-04-14 DIAGNOSIS — N1832 Chronic kidney disease, stage 3b: Secondary | ICD-10-CM | POA: Diagnosis not present

## 2019-04-14 DIAGNOSIS — E11621 Type 2 diabetes mellitus with foot ulcer: Secondary | ICD-10-CM | POA: Diagnosis not present

## 2019-04-14 DIAGNOSIS — I6930 Unspecified sequelae of cerebral infarction: Secondary | ICD-10-CM | POA: Diagnosis not present

## 2019-04-14 DIAGNOSIS — E039 Hypothyroidism, unspecified: Secondary | ICD-10-CM | POA: Diagnosis not present

## 2019-04-14 DIAGNOSIS — Z79891 Long term (current) use of opiate analgesic: Secondary | ICD-10-CM | POA: Diagnosis not present

## 2019-04-14 DIAGNOSIS — Z7952 Long term (current) use of systemic steroids: Secondary | ICD-10-CM | POA: Diagnosis not present

## 2019-04-14 DIAGNOSIS — E1142 Type 2 diabetes mellitus with diabetic polyneuropathy: Secondary | ICD-10-CM | POA: Diagnosis not present

## 2019-04-14 DIAGNOSIS — I87303 Chronic venous hypertension (idiopathic) without complications of bilateral lower extremity: Secondary | ICD-10-CM | POA: Diagnosis not present

## 2019-04-14 DIAGNOSIS — Z86718 Personal history of other venous thrombosis and embolism: Secondary | ICD-10-CM | POA: Diagnosis not present

## 2019-04-17 DIAGNOSIS — N1832 Chronic kidney disease, stage 3b: Secondary | ICD-10-CM | POA: Diagnosis not present

## 2019-04-17 DIAGNOSIS — I87303 Chronic venous hypertension (idiopathic) without complications of bilateral lower extremity: Secondary | ICD-10-CM | POA: Diagnosis not present

## 2019-04-17 DIAGNOSIS — I129 Hypertensive chronic kidney disease with stage 1 through stage 4 chronic kidney disease, or unspecified chronic kidney disease: Secondary | ICD-10-CM | POA: Diagnosis not present

## 2019-04-17 DIAGNOSIS — E11621 Type 2 diabetes mellitus with foot ulcer: Secondary | ICD-10-CM | POA: Diagnosis not present

## 2019-04-17 DIAGNOSIS — L97521 Non-pressure chronic ulcer of other part of left foot limited to breakdown of skin: Secondary | ICD-10-CM | POA: Diagnosis not present

## 2019-04-17 DIAGNOSIS — E1122 Type 2 diabetes mellitus with diabetic chronic kidney disease: Secondary | ICD-10-CM | POA: Diagnosis not present

## 2019-04-18 ENCOUNTER — Ambulatory Visit: Payer: Medicare Other | Admitting: Sports Medicine

## 2019-04-19 ENCOUNTER — Other Ambulatory Visit: Payer: Self-pay

## 2019-04-19 ENCOUNTER — Ambulatory Visit (INDEPENDENT_AMBULATORY_CARE_PROVIDER_SITE_OTHER): Payer: Medicare Other | Admitting: Sports Medicine

## 2019-04-19 ENCOUNTER — Encounter: Payer: Self-pay | Admitting: Sports Medicine

## 2019-04-19 DIAGNOSIS — E1142 Type 2 diabetes mellitus with diabetic polyneuropathy: Secondary | ICD-10-CM | POA: Diagnosis not present

## 2019-04-19 DIAGNOSIS — I739 Peripheral vascular disease, unspecified: Secondary | ICD-10-CM | POA: Diagnosis not present

## 2019-04-19 DIAGNOSIS — L97521 Non-pressure chronic ulcer of other part of left foot limited to breakdown of skin: Secondary | ICD-10-CM
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
## 2019-05-04 DIAGNOSIS — J90 Pleural effusion, not elsewhere classified: Secondary | ICD-10-CM

## 2019-05-04 DIAGNOSIS — E119 Type 2 diabetes mellitus without complications: Secondary | ICD-10-CM

## 2019-05-04 DIAGNOSIS — S32000A Wedge compression fracture of unspecified lumbar vertebra, initial encounter for closed fracture: Secondary | ICD-10-CM | POA: Diagnosis not present

## 2019-05-04 DIAGNOSIS — I4891 Unspecified atrial fibrillation: Secondary | ICD-10-CM | POA: Diagnosis not present

## 2019-05-04 DIAGNOSIS — I340 Nonrheumatic mitral (valve) insufficiency: Secondary | ICD-10-CM | POA: Diagnosis not present

## 2019-05-04 DIAGNOSIS — R609 Edema, unspecified: Secondary | ICD-10-CM | POA: Diagnosis not present

## 2019-05-04 DIAGNOSIS — R29898 Other symptoms and signs involving the musculoskeletal system: Secondary | ICD-10-CM | POA: Diagnosis not present

## 2019-05-04 DIAGNOSIS — R222 Localized swelling, mass and lump, trunk: Secondary | ICD-10-CM

## 2019-05-05 DIAGNOSIS — R29898 Other symptoms and signs involving the musculoskeletal system: Secondary | ICD-10-CM | POA: Diagnosis not present

## 2019-05-05 DIAGNOSIS — R609 Edema, unspecified: Secondary | ICD-10-CM | POA: Diagnosis not present

## 2019-05-05 DIAGNOSIS — S32000A Wedge compression fracture of unspecified lumbar vertebra, initial encounter for closed fracture: Secondary | ICD-10-CM | POA: Diagnosis not present

## 2019-05-05 DIAGNOSIS — I5032 Chronic diastolic (congestive) heart failure: Secondary | ICD-10-CM

## 2019-05-05 DIAGNOSIS — I4891 Unspecified atrial fibrillation: Secondary | ICD-10-CM | POA: Diagnosis not present

## 2019-05-06 DIAGNOSIS — S32000A Wedge compression fracture of unspecified lumbar vertebra, initial encounter for closed fracture: Secondary | ICD-10-CM | POA: Diagnosis not present

## 2019-05-06 DIAGNOSIS — R609 Edema, unspecified: Secondary | ICD-10-CM | POA: Diagnosis not present

## 2019-05-06 DIAGNOSIS — R29898 Other symptoms and signs involving the musculoskeletal system: Secondary | ICD-10-CM | POA: Diagnosis not present

## 2019-05-06 DIAGNOSIS — I4891 Unspecified atrial fibrillation: Secondary | ICD-10-CM | POA: Diagnosis not present

## 2019-05-07 DIAGNOSIS — R609 Edema, unspecified: Secondary | ICD-10-CM | POA: Diagnosis not present

## 2019-05-07 DIAGNOSIS — I4891 Unspecified atrial fibrillation: Secondary | ICD-10-CM | POA: Diagnosis not present

## 2019-05-07 DIAGNOSIS — S32000A Wedge compression fracture of unspecified lumbar vertebra, initial encounter for closed fracture: Secondary | ICD-10-CM | POA: Diagnosis not present

## 2019-05-07 DIAGNOSIS — R29898 Other symptoms and signs involving the musculoskeletal system: Secondary | ICD-10-CM | POA: Diagnosis not present

## 2019-05-08 DIAGNOSIS — R609 Edema, unspecified: Secondary | ICD-10-CM | POA: Diagnosis not present

## 2019-05-08 DIAGNOSIS — R29898 Other symptoms and signs involving the musculoskeletal system: Secondary | ICD-10-CM | POA: Diagnosis not present

## 2019-05-08 DIAGNOSIS — I4891 Unspecified atrial fibrillation: Secondary | ICD-10-CM | POA: Diagnosis not present

## 2019-05-08 DIAGNOSIS — S32000A Wedge compression fracture of unspecified lumbar vertebra, initial encounter for closed fracture: Secondary | ICD-10-CM | POA: Diagnosis not present

## 2019-05-09 DIAGNOSIS — I4891 Unspecified atrial fibrillation: Secondary | ICD-10-CM | POA: Diagnosis not present

## 2019-05-09 DIAGNOSIS — S32000A Wedge compression fracture of unspecified lumbar vertebra, initial encounter for closed fracture: Secondary | ICD-10-CM | POA: Diagnosis not present

## 2019-05-09 DIAGNOSIS — R609 Edema, unspecified: Secondary | ICD-10-CM | POA: Diagnosis not present

## 2019-05-09 DIAGNOSIS — R29898 Other symptoms and signs involving the musculoskeletal system: Secondary | ICD-10-CM | POA: Diagnosis not present

## 2019-05-10 DIAGNOSIS — R609 Edema, unspecified: Secondary | ICD-10-CM | POA: Diagnosis not present

## 2019-05-10 DIAGNOSIS — R29898 Other symptoms and signs involving the musculoskeletal system: Secondary | ICD-10-CM | POA: Diagnosis not present

## 2019-05-10 DIAGNOSIS — S32000A Wedge compression fracture of unspecified lumbar vertebra, initial encounter for closed fracture: Secondary | ICD-10-CM | POA: Diagnosis not present

## 2019-05-10 DIAGNOSIS — I4891 Unspecified atrial fibrillation: Secondary | ICD-10-CM | POA: Diagnosis not present

## 2019-05-17 ENCOUNTER — Encounter: Payer: Self-pay | Admitting: Sports Medicine

## 2019-05-17 ENCOUNTER — Ambulatory Visit (INDEPENDENT_AMBULATORY_CARE_PROVIDER_SITE_OTHER): Payer: Medicare Other | Admitting: Sports Medicine

## 2019-05-17 ENCOUNTER — Other Ambulatory Visit: Payer: Self-pay

## 2019-05-17 DIAGNOSIS — B351 Tinea unguium: Secondary | ICD-10-CM

## 2019-05-17 DIAGNOSIS — E1142 Type 2 diabetes mellitus with diabetic polyneuropathy: Secondary | ICD-10-CM

## 2019-05-17 DIAGNOSIS — L84 Corns and callosities: Secondary | ICD-10-CM

## 2019-05-17 DIAGNOSIS — M79675 Pain in left toe(s): Secondary | ICD-10-CM

## 2019-05-17 DIAGNOSIS — I739 Peripheral vascular disease, unspecified: Secondary | ICD-10-CM

## 2019-05-17 DIAGNOSIS — M79674 Pain in right toe(s): Secondary | ICD-10-CM
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
## 2019-05-28 ENCOUNTER — Telehealth: Payer: Self-pay

## 2019-05-28 NOTE — Telephone Encounter (Signed)
NOTES ON FILE FROM [facility] [phone], SENT REFERRAL TO SCHEDULING 
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
## 2019-06-13 ENCOUNTER — Ambulatory Visit (INDEPENDENT_AMBULATORY_CARE_PROVIDER_SITE_OTHER): Payer: Medicare Other | Admitting: Sports Medicine

## 2019-06-13 ENCOUNTER — Other Ambulatory Visit: Payer: Self-pay

## 2019-06-13 DIAGNOSIS — L97521 Non-pressure chronic ulcer of other part of left foot limited to breakdown of skin: Secondary | ICD-10-CM

## 2019-06-13 DIAGNOSIS — L84 Corns and callosities: Secondary | ICD-10-CM

## 2019-06-13 DIAGNOSIS — I739 Peripheral vascular disease, unspecified: Secondary | ICD-10-CM

## 2019-06-13 DIAGNOSIS — E1142 Type 2 diabetes mellitus with diabetic polyneuropathy: Secondary | ICD-10-CM
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
## 2019-06-14 ENCOUNTER — Ambulatory Visit (INDEPENDENT_AMBULATORY_CARE_PROVIDER_SITE_OTHER): Payer: Medicare Other | Admitting: Physician Assistant

## 2019-06-14 VITALS — BP 142/68 | HR 58 | Ht 59.0 in | Wt 159.0 lb

## 2019-06-14 DIAGNOSIS — I509 Heart failure, unspecified: Secondary | ICD-10-CM | POA: Diagnosis not present

## 2019-06-14 DIAGNOSIS — I10 Essential (primary) hypertension: Secondary | ICD-10-CM | POA: Diagnosis not present

## 2019-06-14 DIAGNOSIS — I2510 Atherosclerotic heart disease of native coronary artery without angina pectoris: Secondary | ICD-10-CM

## 2019-06-14 DIAGNOSIS — I480 Paroxysmal atrial fibrillation: Secondary | ICD-10-CM

## 2019-06-14 DIAGNOSIS — I739 Peripheral vascular disease, unspecified: Secondary | ICD-10-CM

## 2019-06-14 NOTE — Patient Instructions (Signed)
Medication Instructions:  Your physician recommends that you continue on your current medications as directed. Please refer to the Current Medication list given to you today.  *If you need a refill on your cardiac medications before your next appointment, please call your pharmacy*  Lab Work: NONE ORDERED  TODAY  If you have labs (blood work) drawn today and your tests are completely normal, you will receive your results only by: Marland Kitchen MyChart Message (if you have MyChart) OR . A paper copy in the mail If you have any lab test that is abnormal or we need to change your treatment, we will call you to review the results.  Testing/Procedures: NONE ORDERED  TODAY  Follow-Up: At [facility], you and your health needs are our priority.  As part of our continuing mission to provide you with exceptional heart care, we have created designated Provider Care Teams.  These Care Teams include your primary Cardiologist (physician) and Advanced Practice Providers (APPs -  Physician Assistants and Nurse Practitioners) who all work together to provide you with the care you need, when you need it.  Your next appointment:   2-3  week(s)  The format for your next appointment:   In Person  Provider:   You may see Dr. Graciela Husbands  or one of the following Advanced Practice Providers on your designated Care Team: :Francis Dowse, PA-C   Other Instructions

## 2019-06-16 LAB — WOUND CULTURE: Organism ID, Bacteria: NONE SEEN
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
## 2019-06-28 ENCOUNTER — Ambulatory Visit (INDEPENDENT_AMBULATORY_CARE_PROVIDER_SITE_OTHER): Payer: Medicare Other | Admitting: Physician Assistant

## 2019-06-28 ENCOUNTER — Other Ambulatory Visit: Payer: Self-pay

## 2019-06-28 VITALS — BP 184/78 | HR 88 | Ht 59.0 in | Wt 156.0 lb

## 2019-06-28 DIAGNOSIS — I10 Essential (primary) hypertension: Secondary | ICD-10-CM

## 2019-06-28 DIAGNOSIS — Z79899 Other long term (current) drug therapy: Secondary | ICD-10-CM

## 2019-06-28 DIAGNOSIS — I5032 Chronic diastolic (congestive) heart failure: Secondary | ICD-10-CM

## 2019-06-28 DIAGNOSIS — I480 Paroxysmal atrial fibrillation: Secondary | ICD-10-CM | POA: Diagnosis not present

## 2019-06-28 DIAGNOSIS — I2510 Atherosclerotic heart disease of native coronary artery without angina pectoris: Secondary | ICD-10-CM

## 2019-06-28 MED ORDER — TORSEMIDE 20 MG PO TABS: 60.0000 mg | ORAL_TABLET | Freq: Every day | ORAL | 2 refills | Status: AC

## 2019-06-28 MED ORDER — HYDRALAZINE HCL 25 MG PO TABS: 25.0000 mg | ORAL_TABLET | Freq: Three times a day (TID) | ORAL | 2 refills | Status: DC

## 2019-06-28 NOTE — Patient Instructions (Signed)
Medication Instructions:    START TAKING HYDRALAZINE 25 MG THREE TIMES A DAY   START TAKING TORSEMIDE 60 MG ONCE A DAY     *If you need a refill on your cardiac medications before your next appointment, please call your pharmacy*  Lab Work: BMET  TODAY   If you have labs (blood work) drawn today and your tests are completely normal, you will receive your results only by: Marland Kitchen MyChart Message (if you have MyChart) OR . A paper copy in the mail If you have any lab test that is abnormal or we need to change your treatment, we will call you to review the results.  Testing/Procedures: NONE ORDERED  TODAY  Follow-Up: At [facility], you and your health needs are our priority.  As part of our continuing mission to provide you with exceptional heart care, we have created designated Provider Care Teams.  These Care Teams include your primary Cardiologist (physician) and Advanced Practice Providers (APPs -  Physician Assistants and Nurse Practitioners) who all work together to provide you with the care you need, when you need it.  Your next appointment:   3 week(s)  The format for your next appointment:   In Person  Provider:  Dr Graciela Husbands ONLY contact Virgina Evener if need assistance    Other Instructions:

## 2019-06-29 ENCOUNTER — Ambulatory Visit (INDEPENDENT_AMBULATORY_CARE_PROVIDER_SITE_OTHER): Payer: Medicare Other | Admitting: Sports Medicine

## 2019-06-29 ENCOUNTER — Encounter: Payer: Self-pay | Admitting: Sports Medicine

## 2019-06-29 DIAGNOSIS — L97521 Non-pressure chronic ulcer of other part of left foot limited to breakdown of skin: Secondary | ICD-10-CM | POA: Diagnosis not present

## 2019-06-29 DIAGNOSIS — I739 Peripheral vascular disease, unspecified: Secondary | ICD-10-CM

## 2019-06-29 DIAGNOSIS — I2510 Atherosclerotic heart disease of native coronary artery without angina pectoris: Secondary | ICD-10-CM

## 2019-06-29 DIAGNOSIS — L84 Corns and callosities: Secondary | ICD-10-CM

## 2019-06-29 DIAGNOSIS — E1142 Type 2 diabetes mellitus with diabetic polyneuropathy: Secondary | ICD-10-CM | POA: Diagnosis not present

## 2019-06-29 LAB — BASIC METABOLIC PANEL
BUN/Creatinine Ratio: 49 — ABNORMAL HIGH (ref 12–28)
BUN: 62 mg/dL — ABNORMAL HIGH (ref 8–27)
CO2: 23 mmol/L (ref 20–29)
Calcium: 9.6 mg/dL (ref 8.7–10.3)
Chloride: 102 mmol/L (ref 96–106)
Creatinine, Ser: 1.26 mg/dL — ABNORMAL HIGH (ref 0.57–1.00)
GFR calc Af Amer: 47 mL/min/{1.73_m2} — ABNORMAL LOW (ref >59)
GFR calc non Af Amer: 41 mL/min/{1.73_m2} — ABNORMAL LOW (ref >59)
Glucose: 152 mg/dL — ABNORMAL HIGH (ref 65–99)
Potassium: 4.7 mmol/L (ref 3.5–5.2)
Sodium: 145 mmol/L — ABNORMAL HIGH (ref 134–144)
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
## 2019-06-29 NOTE — Addendum Note (Signed)
Addended by: Oleta Mouse on: 06/29/2019 12:53 PM   Modules accepted: Orders

## 2019-07-03 ENCOUNTER — Other Ambulatory Visit: Payer: Self-pay | Admitting: *Deleted

## 2019-07-03 DIAGNOSIS — I5032 Chronic diastolic (congestive) heart failure: Secondary | ICD-10-CM

## 2019-07-11 ENCOUNTER — Telehealth: Payer: Self-pay | Admitting: Physician Assistant

## 2019-07-11 NOTE — Telephone Encounter (Signed)
called patient to follow up after her nephrology visit yesterday.  She said the kidney specialist noted she was still swollen though made no changes to her medicines.   She is still swollen, though thinks she is having ongoing slow improvement. She is mainly bothered by her significant back pain.  She also mentions a component of abdominal discomfort that is chronic, "the same as always" Not SOB I discussed referral to HF clinic, though she would like to hold off for now, keep Dr. Odessa Fleming visit next week, would rather not do both.  She reports weighing at home 151-153lbs  I encouraged her to call if needed.  Francis Dowse, PA-C

## 2019-07-16 ENCOUNTER — Ambulatory Visit (INDEPENDENT_AMBULATORY_CARE_PROVIDER_SITE_OTHER): Payer: Medicare Other | Admitting: Internal Medicine

## 2019-07-16 ENCOUNTER — Encounter: Payer: Self-pay | Admitting: Internal Medicine

## 2019-07-16 ENCOUNTER — Other Ambulatory Visit: Payer: Self-pay

## 2019-07-16 VITALS — BP 188/84 | HR 70 | Ht 59.0 in | Wt 157.0 lb

## 2019-07-16 DIAGNOSIS — I480 Paroxysmal atrial fibrillation: Secondary | ICD-10-CM | POA: Diagnosis not present

## 2019-07-16 DIAGNOSIS — I509 Heart failure, unspecified: Secondary | ICD-10-CM

## 2019-07-16 DIAGNOSIS — I2510 Atherosclerotic heart disease of native coronary artery without angina pectoris: Secondary | ICD-10-CM

## 2019-07-16 MED ORDER — HYDRALAZINE HCL 50 MG PO TABS: 50.0000 mg | ORAL_TABLET | Freq: Three times a day (TID) | ORAL | 3 refills | Status: DC

## 2019-07-16 MED ORDER — METOLAZONE 2.5 MG PO TABS: ORAL_TABLET | ORAL | 0 refills | Status: DC

## 2019-07-16 NOTE — Patient Instructions (Signed)
Medication Instructions:   **Zaroxolyn 2.5mg  1 tablet by mouth x 4 days  **Increase your Hydralazine to 50mg  every 8 hours (3 times daily) *If you need a refill on your cardiac medications before your next appointment, please call your pharmacy*   Lab Work: BMET  07/30/2019 at 1145am.  If you have labs (blood work) drawn today and your tests are completely normal, you will receive your results only by: 08/01/2019 MyChart Message (if you have MyChart) OR . A paper copy in the mail If you have any lab test that is abnormal or we need to change your treatment, we will call you to review the results.   Testing/Procedures: None ordered.    Follow-Up: At [facility], you and your health needs are our priority.  As part of our continuing mission to provide you with exceptional heart care, we have created designated Provider Care Teams.  These Care Teams include your primary Cardiologist (physician) and Advanced Practice Providers (APPs -  Physician Assistants and Nurse Practitioners) who all work together to provide you with the care you need, when you need it.  We recommend signing up for the patient portal called "MyChart".  Sign up information is provided on this After Visit Summary.  MyChart is used to connect with patients for Virtual Visits (Telemedicine).  Patients are able to view lab/test results, encounter notes, upcoming appointments, etc.  Non-urgent messages can be sent to your provider as well.   To learn more about what you can do with MyChart, go to [facility].    Your next appointment:  12 months with Dr [email or web address]   The format for your next appointment:   In person  Provider:   Dr Graciela Husbands   Other Instructions Referral will be made to HTN clinic
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
## 2019-07-18 ENCOUNTER — Encounter: Payer: Self-pay | Admitting: Sports Medicine

## 2019-07-18 ENCOUNTER — Ambulatory Visit (INDEPENDENT_AMBULATORY_CARE_PROVIDER_SITE_OTHER): Payer: Medicare Other | Admitting: Sports Medicine

## 2019-07-18 ENCOUNTER — Other Ambulatory Visit: Payer: Self-pay

## 2019-07-18 ENCOUNTER — Ambulatory Visit: Payer: Medicare Other | Admitting: Sports Medicine

## 2019-07-18 DIAGNOSIS — M79675 Pain in left toe(s): Secondary | ICD-10-CM

## 2019-07-18 DIAGNOSIS — M2041 Other hammer toe(s) (acquired), right foot: Secondary | ICD-10-CM

## 2019-07-18 DIAGNOSIS — M79674 Pain in right toe(s): Secondary | ICD-10-CM

## 2019-07-18 DIAGNOSIS — B351 Tinea unguium: Secondary | ICD-10-CM | POA: Diagnosis not present

## 2019-07-18 DIAGNOSIS — E1142 Type 2 diabetes mellitus with diabetic polyneuropathy: Secondary | ICD-10-CM

## 2019-07-18 DIAGNOSIS — I739 Peripheral vascular disease, unspecified: Secondary | ICD-10-CM

## 2019-07-18 DIAGNOSIS — L84 Corns and callosities: Secondary | ICD-10-CM

## 2019-07-18 DIAGNOSIS — M2042 Other hammer toe(s) (acquired), left foot: Secondary | ICD-10-CM

## 2019-07-18 DIAGNOSIS — M2142 Flat foot [pes planus] (acquired), left foot: Secondary | ICD-10-CM

## 2019-07-18 DIAGNOSIS — M2141 Flat foot [pes planus] (acquired), right foot: Secondary | ICD-10-CM
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
## 2019-07-23 ENCOUNTER — Encounter (HOSPITAL_COMMUNITY): Payer: Medicare Other

## 2019-07-24 ENCOUNTER — Telehealth: Payer: Self-pay

## 2019-07-24 ENCOUNTER — Other Ambulatory Visit: Payer: Self-pay

## 2019-07-24 ENCOUNTER — Ambulatory Visit (INDEPENDENT_AMBULATORY_CARE_PROVIDER_SITE_OTHER): Payer: Medicare Other | Admitting: Cardiovascular Disease

## 2019-07-24 ENCOUNTER — Encounter: Payer: Self-pay | Admitting: Cardiovascular Disease

## 2019-07-24 VITALS — BP 150/66 | HR 54 | Ht 59.0 in | Wt 152.0 lb

## 2019-07-24 DIAGNOSIS — I5033 Acute on chronic diastolic (congestive) heart failure: Secondary | ICD-10-CM | POA: Insufficient documentation

## 2019-07-24 DIAGNOSIS — I10 Essential (primary) hypertension: Secondary | ICD-10-CM | POA: Diagnosis not present

## 2019-07-24 DIAGNOSIS — Z79899 Other long term (current) drug therapy: Secondary | ICD-10-CM

## 2019-07-24 DIAGNOSIS — I2510 Atherosclerotic heart disease of native coronary artery without angina pectoris: Secondary | ICD-10-CM

## 2019-07-24 DIAGNOSIS — I480 Paroxysmal atrial fibrillation: Secondary | ICD-10-CM | POA: Diagnosis not present

## 2019-07-24 DIAGNOSIS — I5032 Chronic diastolic (congestive) heart failure: Secondary | ICD-10-CM | POA: Diagnosis not present

## 2019-07-24 HISTORY — DX: Chronic diastolic (congestive) heart failure: I50.32
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
## 2019-07-24 NOTE — Patient Instructions (Addendum)
Medication Instructions:  INCREASE THE HYDRALAZINE TO 50 MG THREE TIMES A DAY TAKING AT 8:00 AM, 4:00 PM AND 12 MIDNIGHT    Labwork: BMET/MAGNESIUM TODAY    Testing/Procedures: Your physician has requested that you have a renal artery duplex. During this test, an ultrasound is used to evaluate blood flow to the kidneys. Allow one hour for this exam. Do not eat after midnight the day before and avoid carbonated beverages. Take your medications as you usually do.   Follow-Up: Your physician recommends that you schedule a follow-up appointment in: 1 MONTH WITH PHARM D ON 08/28/2019 AT 2:00 PM    Your physician recommends that you schedule a follow-up appointment in: 4 MONTHS WITH DR [facility] THE OFFICE WILL CALL YOU AN APPOINTMENT   Special Instructions:  MONITOR YOUR BLOOD PRESSURE TWICE A DAY, LOG IN THE BOOK PROVIDED. BRING YOU BOOK AND BLOOD PRESSURE MACHINE TO FOLLOW UP   DASH Eating Plan DASH stands for "Dietary Approaches to Stop Hypertension." The DASH eating plan is a healthy eating plan that has been shown to reduce high blood pressure (hypertension). It may also reduce your risk for type 2 diabetes, heart disease, and stroke. The DASH eating plan may also help with weight loss. What are tips for following this plan?  General guidelines  Avoid eating more than 2,300 mg (milligrams) of salt (sodium) a day. If you have hypertension, you may need to reduce your sodium intake to 1,500 mg a day.  Limit alcohol intake to no more than 1 drink a day for nonpregnant women and 2 drinks a day for men. One drink equals 12 oz of beer, 5 oz of wine, or 1 oz of hard liquor.  Work with your health care provider to maintain a healthy body weight or to lose weight. Ask what an ideal weight is for you.  Get at least 30 minutes of exercise that causes your heart to beat faster (aerobic exercise) most days of the week. Activities may include walking, swimming, or biking.  Work with your health care  provider or diet and nutrition specialist (dietitian) to adjust your eating plan to your individual calorie needs. Reading food labels   Check food labels for the amount of sodium per serving. Choose foods with less than 5 percent of the Daily Value of sodium. Generally, foods with less than 300 mg of sodium per serving fit into this eating plan.  To find whole grains, look for the word "whole" as the first word in the ingredient list. Shopping  Buy products labeled as "low-sodium" or "no salt added."  Buy fresh foods. Avoid canned foods and premade or frozen meals. Cooking  Avoid adding salt when cooking. Use salt-free seasonings or herbs instead of table salt or sea salt. Check with your health care provider or pharmacist before using salt substitutes.  Do not fry foods. Cook foods using healthy methods such as baking, boiling, grilling, and broiling instead.  Cook with heart-healthy oils, such as olive, canola, soybean, or sunflower oil. Meal planning  Eat a balanced diet that includes: ? 5 or more servings of fruits and vegetables each day. At each meal, try to fill half of your plate with fruits and vegetables. ? Up to 6-8 servings of whole grains each day. ? Less than 6 oz of lean meat, poultry, or fish each day. A 3-oz serving of meat is about the same size as a deck of cards. One egg equals 1 oz. ? 2 servings of low-fat dairy  each day. ? A serving of nuts, seeds, or beans 5 times each week. ? Heart-healthy fats. Healthy fats called Omega-3 fatty acids are found in foods such as flaxseeds and coldwater fish, like sardines, salmon, and mackerel.  Limit how much you eat of the following: ? Canned or prepackaged foods. ? Food that is high in trans fat, such as fried foods. ? Food that is high in saturated fat, such as fatty meat. ? Sweets, desserts, sugary drinks, and other foods with added sugar. ? Full-fat dairy products.  Do not salt foods before eating.  Try to eat at  least 2 vegetarian meals each week.  Eat more home-cooked food and less restaurant, buffet, and fast food.  When eating at a restaurant, ask that your food be prepared with less salt or no salt, if possible. What foods are recommended? The items listed may not be a complete list. Talk with your dietitian about what dietary choices are best for you. Grains Whole-grain or whole-wheat bread. Whole-grain or whole-wheat pasta. Brown rice. [place]row. Bulgur. Whole-grain and low-sodium cereals. Pita bread. Low-fat, low-sodium crackers. Whole-wheat flour tortillas. Vegetables Fresh or frozen vegetables (raw, steamed, roasted, or grilled). Low-sodium or reduced-sodium tomato and vegetable juice. Low-sodium or reduced-sodium tomato sauce and tomato paste. Low-sodium or reduced-sodium canned vegetables. Fruits All fresh, dried, or frozen fruit. Canned fruit in natural juice (without added sugar). Meat and other protein foods Skinless chicken or [place]. Ground chicken or [place]. Pork with fat trimmed off. Fish and seafood. Egg whites. Dried beans, peas, or lentils. Unsalted nuts, nut butters, and seeds. Unsalted canned beans. Lean cuts of beef with fat trimmed off. Low-sodium, lean deli meat. Dairy Low-fat (1%) or fat-free (skim) milk. Fat-free, low-fat, or reduced-fat cheeses. Nonfat, low-sodium ricotta or cottage cheese. Low-fat or nonfat yogurt. Low-fat, low-sodium cheese. Fats and oils Soft margarine without trans fats. Vegetable oil. Low-fat, reduced-fat, or light mayonnaise and salad dressings (reduced-sodium). Canola, safflower, olive, soybean, and sunflower oils. Avocado. Seasoning and other foods Herbs. Spices. Seasoning mixes without salt. Unsalted popcorn and pretzels. Fat-free sweets. What foods are not recommended? The items listed may not be a complete list. Talk with your dietitian about what dietary choices are best for you. Grains Baked goods made with fat, such as croissants,  muffins, or some breads. Dry pasta or rice meal packs. Vegetables Creamed or fried vegetables. Vegetables in a cheese sauce. Regular canned vegetables (not low-sodium or reduced-sodium). Regular canned tomato sauce and paste (not low-sodium or reduced-sodium). Regular tomato and vegetable juice (not low-sodium or reduced-sodium). Angie Fava. Olives. Fruits Canned fruit in a light or heavy syrup. Fried fruit. Fruit in cream or butter sauce. Meat and other protein foods Fatty cuts of meat. Ribs. Fried meat. Berniece Salines. Sausage. Bologna and other processed lunch meats. Salami. Fatback. Hotdogs. Bratwurst. Salted nuts and seeds. Canned beans with added salt. Canned or smoked fish. Whole eggs or egg yolks. Chicken or [place] with skin. Dairy Whole or 2% milk, cream, and half-and-half. Whole or full-fat cream cheese. Whole-fat or sweetened yogurt. Full-fat cheese. Nondairy creamers. Whipped toppings. Processed cheese and cheese spreads. Fats and oils Butter. Stick margarine. Lard. Shortening. Ghee. Bacon fat. Tropical oils, such as coconut, palm kernel, or palm oil. Seasoning and other foods Salted popcorn and pretzels. Onion salt, garlic salt, seasoned salt, table salt, and sea salt. Worcestershire sauce. Tartar sauce. Barbecue sauce. Teriyaki sauce. Soy sauce, including reduced-sodium. Steak sauce. Canned and packaged gravies. Fish sauce. Oyster sauce. Cocktail sauce. Horseradish that you find on  the shelf. Ketchup. Mustard. Meat flavorings and tenderizers. Bouillon cubes. Hot sauce and Tabasco sauce. Premade or packaged marinades. Premade or packaged taco seasonings. Relishes. Regular salad dressings. Where to find more information:  National Heart, Lung, and Blood Institute: PopSteam.is  American Heart Association: www.heart.org Summary  The DASH eating plan is a healthy eating plan that has been shown to reduce high blood pressure (hypertension). It may also reduce your risk for type 2 diabetes,  heart disease, and stroke.  With the DASH eating plan, you should limit salt (sodium) intake to 2,300 mg a day. If you have hypertension, you may need to reduce your sodium intake to 1,500 mg a day.  When on the DASH eating plan, aim to eat more fresh fruits and vegetables, whole grains, lean proteins, low-fat dairy, and heart-healthy fats.  Work with your health care provider or diet and nutrition specialist (dietitian) to adjust your eating plan to your individual calorie needs. This information is not intended to replace advice given to you by your health care provider. Make sure you discuss any questions you have with your health care provider. Document Released: 04/08/2011 Document Revised: 04/01/2017 Document Reviewed: 04/12/2016 Elsevier Patient Education  2020 [employer].

## 2019-07-24 NOTE — Telephone Encounter (Signed)
Called to remind patient about her htn appointment.

## 2019-07-25 ENCOUNTER — Telehealth: Payer: Self-pay | Admitting: *Deleted

## 2019-07-25 DIAGNOSIS — N289 Disorder of kidney and ureter, unspecified: Secondary | ICD-10-CM

## 2019-07-25 LAB — MAGNESIUM: Magnesium: 2.4 mg/dL — ABNORMAL HIGH (ref 1.6–2.3)

## 2019-07-25 LAB — BASIC METABOLIC PANEL
BUN/Creatinine Ratio: 53 — ABNORMAL HIGH (ref 12–28)
BUN: 98 mg/dL (ref 8–27)
CO2: 25 mmol/L (ref 20–29)
Calcium: 9.6 mg/dL (ref 8.7–10.3)
Chloride: 89 mmol/L — ABNORMAL LOW (ref 96–106)
Creatinine, Ser: 1.86 mg/dL — ABNORMAL HIGH (ref 0.57–1.00)
GFR calc Af Amer: 30 mL/min/{1.73_m2} — ABNORMAL LOW (ref >59)
GFR calc non Af Amer: 26 mL/min/{1.73_m2} — ABNORMAL LOW (ref >59)
Glucose: 315 mg/dL — ABNORMAL HIGH (ref 65–99)
Potassium: 4.4 mmol/L (ref 3.5–5.2)
Sodium: 133 mmol/L — ABNORMAL LOW (ref 134–144)

## 2019-07-25 NOTE — Telephone Encounter (Signed)
BUN 98 Cr 1.86   Discussed with Dr Duke Salvia and will stop metolazone, torsemide and spironolactone Resume torsemide on Friday and Spironolactone on Sunday. Repeat BMP next Monday or Tuesday Advised patient, verbalized understanding. Reviewed with [name] daughter  [phone]) per patient request as well.

## 2019-07-30 ENCOUNTER — Other Ambulatory Visit: Payer: Medicare Other

## 2019-07-30 DIAGNOSIS — J90 Pleural effusion, not elsewhere classified: Secondary | ICD-10-CM

## 2019-07-30 DIAGNOSIS — R739 Hyperglycemia, unspecified: Secondary | ICD-10-CM | POA: Diagnosis not present

## 2019-07-30 DIAGNOSIS — E860 Dehydration: Secondary | ICD-10-CM | POA: Diagnosis not present

## 2019-07-30 DIAGNOSIS — R109 Unspecified abdominal pain: Secondary | ICD-10-CM | POA: Diagnosis not present

## 2019-07-30 DIAGNOSIS — I639 Cerebral infarction, unspecified: Secondary | ICD-10-CM | POA: Diagnosis not present

## 2019-07-30 DIAGNOSIS — E871 Hypo-osmolality and hyponatremia: Secondary | ICD-10-CM

## 2019-07-31 DIAGNOSIS — E860 Dehydration: Secondary | ICD-10-CM | POA: Diagnosis not present

## 2019-07-31 DIAGNOSIS — R109 Unspecified abdominal pain: Secondary | ICD-10-CM | POA: Diagnosis not present

## 2019-07-31 DIAGNOSIS — I639 Cerebral infarction, unspecified: Secondary | ICD-10-CM | POA: Diagnosis not present

## 2019-07-31 DIAGNOSIS — R739 Hyperglycemia, unspecified: Secondary | ICD-10-CM | POA: Diagnosis not present

## 2019-08-01 DIAGNOSIS — E860 Dehydration: Secondary | ICD-10-CM | POA: Diagnosis not present

## 2019-08-01 DIAGNOSIS — I639 Cerebral infarction, unspecified: Secondary | ICD-10-CM | POA: Diagnosis not present

## 2019-08-01 DIAGNOSIS — R739 Hyperglycemia, unspecified: Secondary | ICD-10-CM | POA: Diagnosis not present

## 2019-08-01 DIAGNOSIS — R109 Unspecified abdominal pain: Secondary | ICD-10-CM | POA: Diagnosis not present

## 2019-08-02 DIAGNOSIS — E860 Dehydration: Secondary | ICD-10-CM

## 2019-08-02 DIAGNOSIS — R739 Hyperglycemia, unspecified: Secondary | ICD-10-CM | POA: Diagnosis not present

## 2019-08-02 DIAGNOSIS — R109 Unspecified abdominal pain: Secondary | ICD-10-CM | POA: Diagnosis not present

## 2019-08-02 DIAGNOSIS — I639 Cerebral infarction, unspecified: Secondary | ICD-10-CM | POA: Diagnosis not present

## 2019-08-03 DIAGNOSIS — E860 Dehydration: Secondary | ICD-10-CM | POA: Diagnosis not present

## 2019-08-03 DIAGNOSIS — R739 Hyperglycemia, unspecified: Secondary | ICD-10-CM | POA: Diagnosis not present

## 2019-08-03 DIAGNOSIS — I639 Cerebral infarction, unspecified: Secondary | ICD-10-CM | POA: Diagnosis not present

## 2019-08-03 DIAGNOSIS — R109 Unspecified abdominal pain: Secondary | ICD-10-CM | POA: Diagnosis not present

## 2019-08-09 ENCOUNTER — Encounter (HOSPITAL_COMMUNITY): Payer: Medicare Other

## 2019-08-10 ENCOUNTER — Inpatient Hospital Stay (HOSPITAL_COMMUNITY): Admission: RE | Admit: 2019-08-10 | Payer: Medicare Other | Source: Ambulatory Visit

## 2019-08-15 ENCOUNTER — Ambulatory Visit: Payer: Medicare Other | Admitting: Orthotics

## 2019-08-15 ENCOUNTER — Ambulatory Visit (INDEPENDENT_AMBULATORY_CARE_PROVIDER_SITE_OTHER): Payer: Medicare Other | Admitting: Sports Medicine

## 2019-08-15 ENCOUNTER — Encounter: Payer: Self-pay | Admitting: Sports Medicine

## 2019-08-15 ENCOUNTER — Other Ambulatory Visit: Payer: Self-pay

## 2019-08-15 DIAGNOSIS — E1142 Type 2 diabetes mellitus with diabetic polyneuropathy: Secondary | ICD-10-CM

## 2019-08-15 DIAGNOSIS — L97521 Non-pressure chronic ulcer of other part of left foot limited to breakdown of skin: Secondary | ICD-10-CM | POA: Diagnosis not present

## 2019-08-15 DIAGNOSIS — M2041 Other hammer toe(s) (acquired), right foot: Secondary | ICD-10-CM

## 2019-08-15 DIAGNOSIS — I739 Peripheral vascular disease, unspecified: Secondary | ICD-10-CM

## 2019-08-15 DIAGNOSIS — M2042 Other hammer toe(s) (acquired), left foot: Secondary | ICD-10-CM

## 2019-08-15 DIAGNOSIS — L84 Corns and callosities: Secondary | ICD-10-CM
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
## 2019-08-15 NOTE — Progress Notes (Signed)

## 2019-08-16 ENCOUNTER — Encounter: Payer: Self-pay | Admitting: Cardiovascular Disease

## 2019-08-16 ENCOUNTER — Ambulatory Visit (INDEPENDENT_AMBULATORY_CARE_PROVIDER_SITE_OTHER): Payer: Medicare Other | Admitting: Cardiovascular Disease

## 2019-08-16 VITALS — BP 134/78 | HR 60 | Temp 97.9°F | Ht 59.0 in | Wt 164.4 lb

## 2019-08-16 DIAGNOSIS — I2510 Atherosclerotic heart disease of native coronary artery without angina pectoris: Secondary | ICD-10-CM

## 2019-08-16 DIAGNOSIS — I480 Paroxysmal atrial fibrillation: Secondary | ICD-10-CM

## 2019-08-16 DIAGNOSIS — I5032 Chronic diastolic (congestive) heart failure: Secondary | ICD-10-CM | POA: Diagnosis not present

## 2019-08-16 DIAGNOSIS — J90 Pleural effusion, not elsewhere classified: Secondary | ICD-10-CM

## 2019-08-16 DIAGNOSIS — I10 Essential (primary) hypertension: Secondary | ICD-10-CM

## 2019-08-16 MED ORDER — VALSARTAN 160 MG PO TABS: 160.0000 mg | ORAL_TABLET | Freq: Every day | ORAL | 5 refills | Status: DC

## 2019-08-16 NOTE — Patient Instructions (Addendum)
Medication Instructions:  STOP AMLODIPINE   START VALSARTAN 160 MG DAILY  TRY TO USE METOLAZONE ONLY AS NEEDED    *If you need a refill on your cardiac medications before your next appointment, please call your pharmacy*  Lab Work: BMET 1 WEEK [place]   If you have labs (blood work) drawn today and your tests are completely normal, you will receive your results only by: Marland Kitchen MyChart Message (if you have MyChart) OR . A paper copy in the mail If you have any lab test that is abnormal or we need to change your treatment, we will call you to review the results.  Testing/Procedures: Your physician has requested that you have a renal artery duplex. During this test, an ultrasound is used to evaluate blood flow to the kidneys. Allow one hour for this exam. Do not eat after midnight the day before and avoid carbonated beverages. Take your medications as you usually do. IN THE [place] OFFICE IF POSSIBLE   Follow-Up: At [facility], you and your health needs are our priority.  As part of our continuing mission to provide you with exceptional heart care, we have created designated Provider Care Teams.  These Care Teams include your primary Cardiologist (physician) and Advanced Practice Providers (APPs -  Physician Assistants and Nurse Practitioners) who all work together to provide you with the care you need, when you need it.  We recommend signing up for the patient portal called "MyChart".  Sign up information is provided on this After Visit Summary.  MyChart is used to connect with patients for Virtual Visits (Telemedicine).  Patients are able to view lab/test results, encounter notes, upcoming appointments, etc.  Non-urgent messages can be sent to your provider as well.   To learn more about what you can do with MyChart, go to [email or web address].    Your next appointment:   4 week(s)  The format for your next appointment:   In Person  Provider:   You may see DR [facility]  or one of  the following Advanced Practice Providers on your designated Care Team:    Corine Shelter, PA-C  [place], [place]  Edd Fabian, [place]
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
## 2019-08-20 ENCOUNTER — Encounter (HOSPITAL_COMMUNITY): Payer: Medicare Other

## 2019-08-21 ENCOUNTER — Inpatient Hospital Stay (HOSPITAL_COMMUNITY)
Admission: EM | Admit: 2019-08-21 | Discharge: 2019-08-25 | DRG: 682 | Disposition: A | Discharge status: Home Health | Payer: Medicare Other | Attending: Internal Medicine | Admitting: Internal Medicine

## 2019-08-21 ENCOUNTER — Other Ambulatory Visit: Payer: Self-pay

## 2019-08-21 ENCOUNTER — Telehealth: Payer: Self-pay | Admitting: Cardiovascular Disease

## 2019-08-21 ENCOUNTER — Inpatient Hospital Stay (HOSPITAL_COMMUNITY): Payer: Medicare Other

## 2019-08-21 ENCOUNTER — Ambulatory Visit (HOSPITAL_BASED_OUTPATIENT_CLINIC_OR_DEPARTMENT_OTHER)
Admission: RE | Admit: 2019-08-21 | Discharge: 2019-08-21 | Disposition: A | Discharge status: Home / Self Care | Payer: Medicare Other | Source: Ambulatory Visit | Attending: Cardiovascular Disease | Admitting: Cardiovascular Disease

## 2019-08-21 ENCOUNTER — Emergency Department (HOSPITAL_COMMUNITY): Payer: Medicare Other

## 2019-08-21 ENCOUNTER — Encounter (HOSPITAL_COMMUNITY): Payer: Self-pay | Admitting: Emergency Medicine

## 2019-08-21 DIAGNOSIS — N179 Acute kidney failure, unspecified: Secondary | ICD-10-CM

## 2019-08-21 DIAGNOSIS — Z7952 Long term (current) use of systemic steroids: Secondary | ICD-10-CM

## 2019-08-21 DIAGNOSIS — I272 Pulmonary hypertension, unspecified: Secondary | ICD-10-CM | POA: Diagnosis present

## 2019-08-21 DIAGNOSIS — Z6834 Body mass index (BMI) 34.0-34.9, adult: Secondary | ICD-10-CM

## 2019-08-21 DIAGNOSIS — I130 Hypertensive heart and chronic kidney disease with heart failure and stage 1 through stage 4 chronic kidney disease, or unspecified chronic kidney disease: Secondary | ICD-10-CM | POA: Diagnosis present

## 2019-08-21 DIAGNOSIS — Z807 Family history of other malignant neoplasms of lymphoid, hematopoietic and related tissues: Secondary | ICD-10-CM

## 2019-08-21 DIAGNOSIS — I959 Hypotension, unspecified: Secondary | ICD-10-CM | POA: Diagnosis present

## 2019-08-21 DIAGNOSIS — Z20822 Contact with and (suspected) exposure to covid-19: Secondary | ICD-10-CM | POA: Diagnosis present

## 2019-08-21 DIAGNOSIS — E785 Hyperlipidemia, unspecified: Secondary | ICD-10-CM | POA: Diagnosis present

## 2019-08-21 DIAGNOSIS — I10 Essential (primary) hypertension: Secondary | ICD-10-CM

## 2019-08-21 DIAGNOSIS — D539 Nutritional anemia, unspecified: Secondary | ICD-10-CM | POA: Diagnosis present

## 2019-08-21 DIAGNOSIS — I2510 Atherosclerotic heart disease of native coronary artery without angina pectoris: Secondary | ICD-10-CM | POA: Diagnosis present

## 2019-08-21 DIAGNOSIS — Z886 Allergy status to analgesic agent status: Secondary | ICD-10-CM

## 2019-08-21 DIAGNOSIS — E2740 Unspecified adrenocortical insufficiency: Secondary | ICD-10-CM | POA: Diagnosis present

## 2019-08-21 DIAGNOSIS — E875 Hyperkalemia: Secondary | ICD-10-CM | POA: Diagnosis present

## 2019-08-21 DIAGNOSIS — Z823 Family history of stroke: Secondary | ICD-10-CM

## 2019-08-21 DIAGNOSIS — E1022 Type 1 diabetes mellitus with diabetic chronic kidney disease: Secondary | ICD-10-CM | POA: Diagnosis present

## 2019-08-21 DIAGNOSIS — Z7901 Long term (current) use of anticoagulants: Secondary | ICD-10-CM

## 2019-08-21 DIAGNOSIS — Z882 Allergy status to sulfonamides status: Secondary | ICD-10-CM | POA: Diagnosis not present

## 2019-08-21 DIAGNOSIS — Z7189 Other specified counseling: Secondary | ICD-10-CM

## 2019-08-21 DIAGNOSIS — N183 Chronic kidney disease, stage 3 unspecified: Secondary | ICD-10-CM

## 2019-08-21 DIAGNOSIS — N170 Acute kidney failure with tubular necrosis: Secondary | ICD-10-CM | POA: Diagnosis present

## 2019-08-21 DIAGNOSIS — I5032 Chronic diastolic (congestive) heart failure: Secondary | ICD-10-CM | POA: Diagnosis present

## 2019-08-21 DIAGNOSIS — N1831 Chronic kidney disease, stage 3a: Secondary | ICD-10-CM | POA: Diagnosis present

## 2019-08-21 DIAGNOSIS — E162 Hypoglycemia, unspecified: Secondary | ICD-10-CM | POA: Diagnosis present

## 2019-08-21 DIAGNOSIS — E10649 Type 1 diabetes mellitus with hypoglycemia without coma: Secondary | ICD-10-CM | POA: Diagnosis present

## 2019-08-21 DIAGNOSIS — I480 Paroxysmal atrial fibrillation: Secondary | ICD-10-CM | POA: Diagnosis present

## 2019-08-21 DIAGNOSIS — E871 Hypo-osmolality and hyponatremia: Secondary | ICD-10-CM | POA: Diagnosis present

## 2019-08-21 DIAGNOSIS — J309 Allergic rhinitis, unspecified: Secondary | ICD-10-CM | POA: Diagnosis present

## 2019-08-21 DIAGNOSIS — N19 Unspecified kidney failure: Secondary | ICD-10-CM

## 2019-08-21 DIAGNOSIS — M0579 Rheumatoid arthritis with rheumatoid factor of multiple sites without organ or systems involvement: Secondary | ICD-10-CM | POA: Diagnosis present

## 2019-08-21 DIAGNOSIS — Z7982 Long term (current) use of aspirin: Secondary | ICD-10-CM

## 2019-08-21 DIAGNOSIS — M3500 Sicca syndrome, unspecified: Secondary | ICD-10-CM | POA: Diagnosis present

## 2019-08-21 DIAGNOSIS — E669 Obesity, unspecified: Secondary | ICD-10-CM | POA: Diagnosis present

## 2019-08-21 DIAGNOSIS — G9341 Metabolic encephalopathy: Secondary | ICD-10-CM | POA: Diagnosis not present

## 2019-08-21 DIAGNOSIS — D72829 Elevated white blood cell count, unspecified: Secondary | ICD-10-CM | POA: Diagnosis present

## 2019-08-21 DIAGNOSIS — Z91040 Latex allergy status: Secondary | ICD-10-CM

## 2019-08-21 DIAGNOSIS — M069 Rheumatoid arthritis, unspecified: Secondary | ICD-10-CM | POA: Diagnosis present

## 2019-08-21 DIAGNOSIS — E860 Dehydration: Secondary | ICD-10-CM | POA: Diagnosis present

## 2019-08-21 DIAGNOSIS — Z8673 Personal history of transient ischemic attack (TIA), and cerebral infarction without residual deficits: Secondary | ICD-10-CM

## 2019-08-21 DIAGNOSIS — R601 Generalized edema: Secondary | ICD-10-CM | POA: Diagnosis present

## 2019-08-21 DIAGNOSIS — N289 Disorder of kidney and ureter, unspecified: Secondary | ICD-10-CM

## 2019-08-21 DIAGNOSIS — G253 Myoclonus: Secondary | ICD-10-CM | POA: Diagnosis not present

## 2019-08-21 DIAGNOSIS — E8770 Fluid overload, unspecified: Secondary | ICD-10-CM

## 2019-08-21 DIAGNOSIS — I639 Cerebral infarction, unspecified: Secondary | ICD-10-CM

## 2019-08-21 DIAGNOSIS — I5033 Acute on chronic diastolic (congestive) heart failure: Secondary | ICD-10-CM | POA: Diagnosis present

## 2019-08-21 DIAGNOSIS — Z8349 Family history of other endocrine, nutritional and metabolic diseases: Secondary | ICD-10-CM

## 2019-08-21 DIAGNOSIS — Z79899 Other long term (current) drug therapy: Secondary | ICD-10-CM

## 2019-08-21 DIAGNOSIS — E039 Hypothyroidism, unspecified: Secondary | ICD-10-CM | POA: Diagnosis present

## 2019-08-21 DIAGNOSIS — Z79890 Hormone replacement therapy (postmenopausal): Secondary | ICD-10-CM

## 2019-08-21 DIAGNOSIS — E1042 Type 1 diabetes mellitus with diabetic polyneuropathy: Secondary | ICD-10-CM | POA: Diagnosis present

## 2019-08-21 DIAGNOSIS — Z8249 Family history of ischemic heart disease and other diseases of the circulatory system: Secondary | ICD-10-CM

## 2019-08-21 DIAGNOSIS — E119 Type 2 diabetes mellitus without complications: Secondary | ICD-10-CM

## 2019-08-21 DIAGNOSIS — F419 Anxiety disorder, unspecified: Secondary | ICD-10-CM | POA: Diagnosis present

## 2019-08-21 DIAGNOSIS — L89620 Pressure ulcer of left heel, unstageable: Secondary | ICD-10-CM | POA: Diagnosis present

## 2019-08-21 DIAGNOSIS — R001 Bradycardia, unspecified: Secondary | ICD-10-CM | POA: Diagnosis present

## 2019-08-21 DIAGNOSIS — Z79891 Long term (current) use of opiate analgesic: Secondary | ICD-10-CM

## 2019-08-21 DIAGNOSIS — K219 Gastro-esophageal reflux disease without esophagitis: Secondary | ICD-10-CM | POA: Diagnosis present

## 2019-08-21 DIAGNOSIS — Z794 Long term (current) use of insulin: Secondary | ICD-10-CM

## 2019-08-21 LAB — COMPREHENSIVE METABOLIC PANEL
ALT: 29 U/L (ref 0–44)
AST: 27 U/L (ref 15–41)
Albumin: 3 g/dL — ABNORMAL LOW (ref 3.5–5.0)
Alkaline Phosphatase: 99 U/L (ref 38–126)
Anion gap: 13 (ref 5–15)
BUN: 140 mg/dL — ABNORMAL HIGH (ref 8–23)
CO2: 29 mmol/L (ref 22–32)
Calcium: 8.5 mg/dL — ABNORMAL LOW (ref 8.9–10.3)
Chloride: 89 mmol/L — ABNORMAL LOW (ref 98–111)
Creatinine, Ser: 2.94 mg/dL — ABNORMAL HIGH (ref 0.44–1.00)
GFR calc Af Amer: 17 mL/min — ABNORMAL LOW (ref >60)
GFR calc non Af Amer: 15 mL/min — ABNORMAL LOW (ref >60)
Glucose, Bld: 65 mg/dL — ABNORMAL LOW (ref 70–99)
Potassium: 4.6 mmol/L (ref 3.5–5.1)
Sodium: 131 mmol/L — ABNORMAL LOW (ref 135–145)
Total Bilirubin: 0.6 mg/dL (ref 0.3–1.2)
Total Protein: 5.7 g/dL — ABNORMAL LOW (ref 6.5–8.1)

## 2019-08-21 LAB — CBC
HCT: 36.2 % (ref 36.0–46.0)
Hemoglobin: 11.4 g/dL — ABNORMAL LOW (ref 12.0–15.0)
MCH: 33.1 pg (ref 26.0–34.0)
MCHC: 31.5 g/dL (ref 30.0–36.0)
MCV: 105.2 fL — ABNORMAL HIGH (ref 80.0–100.0)
Platelets: 187 10*3/uL (ref 150–400)
RBC: 3.44 MIL/uL — ABNORMAL LOW (ref 3.87–5.11)
RDW: 13.8 % (ref 11.5–15.5)
WBC: 13.9 10*3/uL — ABNORMAL HIGH (ref 4.0–10.5)
nRBC: 0.3 % — ABNORMAL HIGH (ref 0.0–0.2)

## 2019-08-21 LAB — CBG MONITORING, ED
Glucose-Capillary: 59 mg/dL — ABNORMAL LOW (ref 70–99)
Glucose-Capillary: 57 mg/dL — ABNORMAL LOW (ref 70–99)
Glucose-Capillary: 69 mg/dL — ABNORMAL LOW (ref 70–99)
Glucose-Capillary: 85 mg/dL (ref 70–99)
Glucose-Capillary: 105 mg/dL — ABNORMAL HIGH (ref 70–99)

## 2019-08-21 LAB — BRAIN NATRIURETIC PEPTIDE: B Natriuretic Peptide: 224.5 pg/mL — ABNORMAL HIGH (ref 0.0–100.0)

## 2019-08-21 LAB — LACTIC ACID, PLASMA: Lactic Acid, Venous: 1.2 mmol/L (ref 0.5–1.9)

## 2019-08-21 LAB — LIPASE, BLOOD: Lipase: 19 U/L (ref 11–51)

## 2019-08-21 LAB — SARS CORONAVIRUS 2 (TAT 6-24 HRS): SARS Coronavirus 2: NEGATIVE

## 2019-08-21 LAB — PHOSPHORUS: Phosphorus: 5.5 mg/dL — ABNORMAL HIGH (ref 2.5–4.6)

## 2019-08-21 LAB — VITAMIN B12: Vitamin B-12: 1078 pg/mL — ABNORMAL HIGH (ref 180–914)

## 2019-08-21 LAB — MAGNESIUM: Magnesium: 3.9 mg/dL — ABNORMAL HIGH (ref 1.7–2.4)

## 2019-08-21 LAB — TSH: TSH: 3.331 u[IU]/mL (ref 0.350–4.500)

## 2019-08-21 MED ORDER — ONDANSETRON HCL 4 MG PO TABS: 4.0000 mg | ORAL_TABLET | Freq: Four times a day (QID) | ORAL | Status: DC | PRN

## 2019-08-21 MED ORDER — ACETAMINOPHEN 325 MG PO TABS: 650.0000 mg | ORAL_TABLET | Freq: Four times a day (QID) | ORAL | Status: DC | PRN

## 2019-08-21 MED ORDER — HYDROMORPHONE HCL 1 MG/ML IJ SOLN
0.5000 mg | INTRAMUSCULAR | Status: DC | PRN
  Administered 2019-08-21: 23:00:00 1 mg via INTRAVENOUS
  Filled 2019-08-21: qty 1

## 2019-08-21 MED ORDER — ACETAMINOPHEN 650 MG RE SUPP: 650.0000 mg | Freq: Four times a day (QID) | RECTAL | Status: DC | PRN

## 2019-08-21 MED ORDER — ONDANSETRON HCL 4 MG/2ML IJ SOLN: 4.0000 mg | Freq: Four times a day (QID) | INTRAMUSCULAR | Status: DC | PRN

## 2019-08-21 MED ORDER — SODIUM CHLORIDE 0.9 % IV BOLUS
250.0000 mL | Freq: Once | INTRAVENOUS | Status: AC
  Administered 2019-08-21: 250 mL via INTRAVENOUS

## 2019-08-21 NOTE — Telephone Encounter (Signed)
Patient presented today for renal artery Dopplers.  She is extremely weak, tired, aneuric in the last 24 hours and having blurry vision.  I am very concerned about acute renal failure.  She will be transported to the ED via EMS.  She needs labs checked emergently for a potassium check.  All diuretic to be held.  Maclean Foister C. Duke Salvia, MD, [facility] 08/21/2019 12:08 PM

## 2019-08-21 NOTE — ED Notes (Signed)
IV team at bedside 
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
## 2019-08-21 NOTE — Progress Notes (Signed)
At room to find patient is in procedure per family member.  Will return later to attempt IV start.
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
## 2019-08-21 NOTE — ED Triage Notes (Signed)
Pt arrives via EMS from PCP where she was having renal study. Sent for acute renal failure, swelling that has worsened. Pt complaining of abd pain and fullness.

## 2019-08-21 NOTE — ED Notes (Signed)
CBG 59, spoke with Dr Denton Lank about results, states giving juice is ok, pt given apple juice

## 2019-08-21 NOTE — ED Notes (Signed)
Bladder Scanned .

## 2019-08-21 NOTE — ED Notes (Signed)
IV came to start an IV on pt but pt was not in the room. IV team asked to be notified when pt is back to room.

## 2019-08-22 ENCOUNTER — Inpatient Hospital Stay (HOSPITAL_COMMUNITY): Payer: Medicare Other

## 2019-08-22 ENCOUNTER — Encounter (HOSPITAL_COMMUNITY): Payer: Self-pay | Admitting: Internal Medicine

## 2019-08-22 HISTORY — PX: IR US GUIDE VASC ACCESS RIGHT: IMG2390

## 2019-08-22 HISTORY — PX: IR FLUORO GUIDE CV LINE RIGHT: IMG2283

## 2019-08-22 LAB — COMPREHENSIVE METABOLIC PANEL
ALT: 32 U/L (ref 0–44)
AST: 25 U/L (ref 15–41)
Albumin: 2.8 g/dL — ABNORMAL LOW (ref 3.5–5.0)
Alkaline Phosphatase: 103 U/L (ref 38–126)
Anion gap: 13 (ref 5–15)
BUN: 138 mg/dL — ABNORMAL HIGH (ref 8–23)
CO2: 29 mmol/L (ref 22–32)
Calcium: 8.7 mg/dL — ABNORMAL LOW (ref 8.9–10.3)
Chloride: 89 mmol/L — ABNORMAL LOW (ref 98–111)
Creatinine, Ser: 3.01 mg/dL — ABNORMAL HIGH (ref 0.44–1.00)
GFR calc Af Amer: 17 mL/min — ABNORMAL LOW (ref >60)
GFR calc non Af Amer: 14 mL/min — ABNORMAL LOW (ref >60)
Glucose, Bld: 143 mg/dL — ABNORMAL HIGH (ref 70–99)
Potassium: 5.4 mmol/L — ABNORMAL HIGH (ref 3.5–5.1)
Sodium: 131 mmol/L — ABNORMAL LOW (ref 135–145)
Total Bilirubin: 0.8 mg/dL (ref 0.3–1.2)
Total Protein: 5.9 g/dL — ABNORMAL LOW (ref 6.5–8.1)

## 2019-08-22 LAB — URINALYSIS, ROUTINE W REFLEX MICROSCOPIC
Bilirubin Urine: NEGATIVE
Glucose, UA: NEGATIVE mg/dL
Hgb urine dipstick: NEGATIVE
Ketones, ur: NEGATIVE mg/dL
Leukocytes,Ua: NEGATIVE
Nitrite: NEGATIVE
Protein, ur: NEGATIVE mg/dL
Specific Gravity, Urine: 1.008 (ref 1.005–1.030)
pH: 5 (ref 5.0–8.0)

## 2019-08-22 LAB — CBC
HCT: 36.1 % (ref 36.0–46.0)
Hemoglobin: 11.6 g/dL — ABNORMAL LOW (ref 12.0–15.0)
MCH: 33 pg (ref 26.0–34.0)
MCHC: 32.1 g/dL (ref 30.0–36.0)
MCV: 102.6 fL — ABNORMAL HIGH (ref 80.0–100.0)
Platelets: 173 10*3/uL (ref 150–400)
RBC: 3.52 MIL/uL — ABNORMAL LOW (ref 3.87–5.11)
RDW: 13.7 % (ref 11.5–15.5)
WBC: 11.5 10*3/uL — ABNORMAL HIGH (ref 4.0–10.5)
nRBC: 0.3 % — ABNORMAL HIGH (ref 0.0–0.2)

## 2019-08-22 LAB — FOLATE RBC
Folate, Hemolysate: >620 ng/mL
Folate, RBC: >2000 ng/mL (ref >498)
Hematocrit: 31 % — ABNORMAL LOW (ref 34.0–46.6)

## 2019-08-22 LAB — GLUCOSE, CAPILLARY
Glucose-Capillary: 112 mg/dL — ABNORMAL HIGH (ref 70–99)
Glucose-Capillary: 121 mg/dL — ABNORMAL HIGH (ref 70–99)
Glucose-Capillary: 138 mg/dL — ABNORMAL HIGH (ref 70–99)
Glucose-Capillary: 143 mg/dL — ABNORMAL HIGH (ref 70–99)
Glucose-Capillary: 167 mg/dL — ABNORMAL HIGH (ref 70–99)
Glucose-Capillary: 169 mg/dL — ABNORMAL HIGH (ref 70–99)

## 2019-08-22 LAB — BASIC METABOLIC PANEL
Anion gap: 15 (ref 5–15)
BUN: 146 mg/dL — ABNORMAL HIGH (ref 8–23)
CO2: 26 mmol/L (ref 22–32)
Calcium: 8.3 mg/dL — ABNORMAL LOW (ref 8.9–10.3)
Chloride: 87 mmol/L — ABNORMAL LOW (ref 98–111)
Creatinine, Ser: 3.21 mg/dL — ABNORMAL HIGH (ref 0.44–1.00)
GFR calc Af Amer: 15 mL/min — ABNORMAL LOW (ref >60)
GFR calc non Af Amer: 13 mL/min — ABNORMAL LOW (ref >60)
Glucose, Bld: 174 mg/dL — ABNORMAL HIGH (ref 70–99)
Potassium: 6.3 mmol/L (ref 3.5–5.1)
Sodium: 128 mmol/L — ABNORMAL LOW (ref 135–145)

## 2019-08-22 LAB — CORTISOL: Cortisol, Plasma: >100 ug/dL

## 2019-08-22 LAB — HEPATITIS B SURFACE ANTIBODY,QUALITATIVE: Hep B S Ab: NONREACTIVE

## 2019-08-22 LAB — HEPATITIS B SURFACE ANTIGEN: Hepatitis B Surface Ag: NONREACTIVE

## 2019-08-22 LAB — CBG MONITORING, ED: Glucose-Capillary: 146 mg/dL — ABNORMAL HIGH (ref 70–99)

## 2019-08-22 LAB — HEPATITIS B CORE ANTIBODY, TOTAL: Hep B Core Total Ab: NONREACTIVE

## 2019-08-22 MED ORDER — ALTEPLASE 2 MG IJ SOLR: 2.0000 mg | Freq: Once | INTRAMUSCULAR | Status: DC | PRN

## 2019-08-22 MED ORDER — INSULIN ASPART 100 UNIT/ML IV SOLN
5.0000 [IU] | Freq: Once | INTRAVENOUS | Status: AC
  Administered 2019-08-22: 5 [IU] via INTRAVENOUS

## 2019-08-22 MED ORDER — ALBUMIN HUMAN 25 % IV SOLN: 25.0000 g | Freq: Once | INTRAVENOUS | Status: AC

## 2019-08-22 MED ORDER — CHLORHEXIDINE GLUCONATE CLOTH 2 % EX PADS
6.0000 | MEDICATED_PAD | Freq: Every day | CUTANEOUS | Status: DC
  Administered 2019-08-23 – 2019-08-25 (×3): 6 via TOPICAL

## 2019-08-22 MED ORDER — SODIUM CHLORIDE 0.9 % IV SOLN: 100.0000 mL | INTRAVENOUS | Status: DC | PRN

## 2019-08-22 MED ORDER — PREDNISONE 5 MG PO TABS
5.0000 mg | ORAL_TABLET | Freq: Two times a day (BID) | ORAL | Status: DC
  Administered 2019-08-22 – 2019-08-25 (×7): 5 mg via ORAL
  Filled 2019-08-22 (×7): qty 1

## 2019-08-22 MED ORDER — DEXTROSE 50 % IV SOLN
25.0000 mL | Freq: Once | INTRAVENOUS | Status: AC
  Administered 2019-08-22: 25 mL via INTRAVENOUS
  Filled 2019-08-22: qty 50

## 2019-08-22 MED ORDER — LEVOTHYROXINE SODIUM 88 MCG PO TABS
88.0000 ug | ORAL_TABLET | Freq: Every day | ORAL | Status: DC
  Administered 2019-08-22 – 2019-08-25 (×4): 88 ug via ORAL
  Filled 2019-08-22 (×4): qty 1

## 2019-08-22 MED ORDER — LIDOCAINE HCL (PF) 1 % IJ SOLN
INTRAMUSCULAR | Status: DC | PRN
  Administered 2019-08-22: 5 mL

## 2019-08-22 MED ORDER — PATIROMER SORBITEX CALCIUM 8.4 G PO PACK
16.8000 g | PACK | Freq: Every day | ORAL | Status: DC
  Filled 2019-08-22 (×2): qty 2

## 2019-08-22 MED ORDER — HEPARIN SODIUM (PORCINE) 1000 UNIT/ML IJ SOLN
INTRAMUSCULAR | Status: AC
  Filled 2019-08-22: qty 1

## 2019-08-22 MED ORDER — GUAIFENESIN 100 MG/5ML PO SOLN
5.0000 mL | ORAL | Status: DC | PRN
  Administered 2019-08-22: 100 mg via ORAL
  Filled 2019-08-22: qty 5

## 2019-08-22 MED ORDER — APIXABAN 5 MG PO TABS: 5.0000 mg | ORAL_TABLET | Freq: Two times a day (BID) | ORAL | Status: DC

## 2019-08-22 MED ORDER — LIDOCAINE HCL 1 % IJ SOLN
INTRAMUSCULAR | Status: AC
  Filled 2019-08-22: qty 20

## 2019-08-22 MED ORDER — HEPARIN SODIUM (PORCINE) 5000 UNIT/ML IJ SOLN
5000.0000 [IU] | Freq: Three times a day (TID) | INTRAMUSCULAR | Status: DC
  Administered 2019-08-22 – 2019-08-25 (×8): 5000 [IU] via SUBCUTANEOUS
  Filled 2019-08-22 (×8): qty 1

## 2019-08-22 MED ORDER — ORAL CARE MOUTH RINSE
15.0000 mL | Freq: Two times a day (BID) | OROMUCOSAL | Status: DC
  Administered 2019-08-22 – 2019-08-24 (×3): 15 mL via OROMUCOSAL

## 2019-08-22 MED ORDER — SODIUM CHLORIDE 0.9 % IV SOLN
1.0000 g | Freq: Once | INTRAVENOUS | Status: DC
  Filled 2019-08-22: qty 10

## 2019-08-22 MED ORDER — PANTOPRAZOLE SODIUM 40 MG PO TBEC
40.0000 mg | DELAYED_RELEASE_TABLET | Freq: Every day | ORAL | Status: DC
  Administered 2019-08-22 – 2019-08-25 (×4): 40 mg via ORAL
  Filled 2019-08-22 (×4): qty 1

## 2019-08-22 MED ORDER — ZOLPIDEM TARTRATE 5 MG PO TABS
5.0000 mg | ORAL_TABLET | Freq: Every evening | ORAL | Status: AC | PRN
  Administered 2019-08-22: 5 mg via ORAL
  Filled 2019-08-22: qty 1

## 2019-08-22 MED ORDER — HEPARIN SODIUM (PORCINE) 1000 UNIT/ML DIALYSIS
1000.0000 [IU] | INTRAMUSCULAR | Status: DC | PRN
  Filled 2019-08-22: qty 1

## 2019-08-22 MED ORDER — ALBUMIN HUMAN 25 % IV SOLN
INTRAVENOUS | Status: AC
  Administered 2019-08-22: 25 g via INTRAVENOUS
  Filled 2019-08-22: qty 100

## 2019-08-22 MED ORDER — HEPARIN SODIUM (PORCINE) 1000 UNIT/ML IJ SOLN
INTRAMUSCULAR | Status: AC
  Filled 2019-08-22: qty 3

## 2019-08-22 MED ORDER — HYDROCORTISONE NA SUCCINATE PF 100 MG IJ SOLR
100.0000 mg | Freq: Once | INTRAMUSCULAR | Status: AC
  Administered 2019-08-22: 100 mg via INTRAVENOUS
  Filled 2019-08-22: qty 2

## 2019-08-22 MED ORDER — PREDNISONE 5 MG PO TABS: 5.0000 mg | ORAL_TABLET | Freq: Two times a day (BID) | ORAL | Status: DC

## 2019-08-22 MED ORDER — ASPIRIN 81 MG PO CHEW
81.0000 mg | CHEWABLE_TABLET | Freq: Every day | ORAL | Status: DC
  Administered 2019-08-22 – 2019-08-25 (×4): 81 mg via ORAL
  Filled 2019-08-22 (×4): qty 1

## 2019-08-22 NOTE — Progress Notes (Signed)
Palliative-   Consult received. Spoke with patient's daughterToniann Fail- she is going to call me back with a time to meet tomorrow.   Ocie Bob, AGNP-C Palliative Medicine  Please call Palliative Medicine team phone with any questions [phone]. For individual providers please see AMION.  No charge

## 2019-08-22 NOTE — Progress Notes (Signed)
PT Cancellation Note  Patient Details Name: Jarissa Sheriff MRN: [id number] DOB: 08-23-1940   Cancelled Treatment:    Reason Eval/Treat Not Completed: Patient at procedure or test/unavailable Will follow up as schedule allows.   Cindee Salt, DPT  Acute Rehabilitation Services  Pager: [phone] Office: [phone]    Lehman Prom 08/22/2019, 4:16 PM

## 2019-08-22 NOTE — Procedures (Signed)
I was present at this dialysis session. I have reviewed the session itself and made appropriate changes.   HD#1.  1K bath, K 6.3.  Soft BPs, reduce UF, trial of bolus albumin.  Qb 250. Time 2.5h.    Filed Weights   08/21/19 1301 08/22/19 0030 08/22/19 1343  Weight: 75 kg 78.1 kg 80.5 kg    Recent Labs  Lab 08/21/19 1830 08/22/19 0430 08/22/19 1010  NA  --    < > 128*  K  --    < > 6.3*  CL  --    < > 87*  CO2  --    < > 26  GLUCOSE  --    < > 174*  BUN  --    < > 146*  CREATININE  --    < > 3.21*  CALCIUM  --    < > 8.3*  PHOS 5.5*  --   --    < > = values in this interval not displayed.    Recent Labs  Lab 08/21/19 1316 08/22/19 0430  WBC 13.9* 11.5*  HGB 11.4* 11.6*  HCT 36.2 36.1  MCV 105.2* 102.6*  PLT 187 173    Scheduled Meds: . aspirin  81 mg Oral Daily  . Chlorhexidine Gluconate Cloth  6 each Topical Q0600  . heparin injection (subcutaneous)  5,000 Units Subcutaneous Q8H  . heparin sodium (porcine)      . levothyroxine  88 mcg Oral QAC breakfast  . lidocaine      . mouth rinse  15 mL Mouth Rinse BID  . pantoprazole  40 mg Oral Daily  . patiromer  16.8 g Oral Daily  . predniSONE  5 mg Oral BID   Continuous Infusions: . albumin human    . sodium chloride    . sodium chloride    . albumin human    . calcium gluconate 1 GM IVPB     PRN Meds:.sodium chloride, sodium chloride, acetaminophen **OR** acetaminophen, alteplase, guaiFENesin, heparin, HYDROmorphone (DILAUDID) injection, lidocaine (PF), ondansetron **OR** ondansetron (ZOFRAN) IV   Sabra Heck  MD 08/22/2019, 2:48 PM

## 2019-08-22 NOTE — Procedures (Signed)
  Procedure: R IJ [place] HD catheter placement 15cm EBL:   minimal Complications:  none immediate  See full dictation in [employer].  Thora Lance MD Main # [phone] Pager  [phone]

## 2019-08-22 NOTE — ED Notes (Addendum)
RN notified of pts temperature. Warm blankets applied.

## 2019-08-22 NOTE — Progress Notes (Signed)
PROGRESS NOTE    [name]  OZD:[id number] DOB: 03-12-1941 DOA: 08/21/2019 PCP: Serita Grammes, MD    Brief Narrative:  79 year old female with history of rheumatoid arthritis on chronic steroids, hypertension, coronary artery disease, paroxysmal A. fib on Eliquis, CVA, chronic diastolic congestive heart failure, CKD stage IIIa, Sjogren's syndrome, recurrent pleural effusion, pressure ulcer on the left foot presented to the emergency room with worsening kidney functions confusion from cardiologist office.  Patient lives at home with 24-hour care and has recently been not feeling well, extremely tired and not having any urine output for the last 24 hours.  Patient does have chronic problems and brain leg swellings and hand swellings.  She also had recently changed medications including addition of valsartan. In the emergency room, blood pressures were low normal.  Afebrile.  Leukocytosis present.  Sodium 131, BUN 140, creatinine 2.94.  COVID-19 negative.  Renal ultrasound and CT scan of the abdomen pelvis no evidence of obstruction.   Assessment & Plan:   Principal Problem:   Acute worsening of stage 3 chronic kidney disease Active Problems:   Rheumatoid arthritis (HCC)   PAF (paroxysmal atrial fibrillation) (HCC)   Macrocytic anemia   Hyperlipidemia   Hypertension   Hypothyroidism   DM (diabetes mellitus) (HCC)   Chronic diastolic heart failure (HCC)   Hyponatremia   Hypoglycemia   Leukocytosis   Worsening renal function  Acute renal failure on history of chronic kidney disease stage IIIa: Acute metabolic encephalopathy Patient presented with uremic symptoms and anuria hyperkalemia. Renal ultrasound negative for obstruction. Continue monitoring, stopping all antihypertensive medications.  Received IV fluid resuscitation. Subsequent examination with increasing BUN and creatinine as well as potassium levels. Followed by nephrology.  Planning for emergent hemodialysis with  IR guided temporary catheter placement.  Acute metabolic encephalopathy: Due to above.  No focal deficit.  Hypoglycemia and hypothermia: Likely secondary to worsening kidney functions.  Stop all long-acting insulin.  Will keep on monitoring.  Use bear-hugger for hypothermia.  Hyponatremia: Due to #1.  Will monitor.  Paroxysmal A. fib: Rate controlled.  Not on any anticoagulation as mentioned above.  Rheumatoid arthritis/Sjogren's syndrome: On p.o. steroids.  Continue.  Stress dose of steroids today.  We will consult PT OT and speech therapy. Also consulted palliative care to educate and counsel patient and family with advanced disease and high mortality.  DVT prophylaxis: SCDs Code Status: Full code Family Communication: Daughter and son on the phone Disposition Plan: Status is: Inpatient  Remains inpatient appropriate because:Persistent severe electrolyte disturbances and Altered mental status   Dispo: The patient is from: Home              Anticipated d/c is to: Home              Anticipated d/c date is: > 3 days              Patient currently is not medically stable to d/c.      Consultants:   Nephrology  Procedures:   None  Antimicrobials:   None   Subjective: Patient seen and examined in the morning rounds.  Later discussed with patient's children.  She was confused and was hardly able to talk and keep up conversation.  Blood pressures are stable.  Objective: Vitals:   08/22/19 0807 08/22/19 0926 08/22/19 1028 08/22/19 1130  BP: 117/67     Pulse: 76     Resp: 15     Temp:  100 F (37.8 C) 99.7 F (37.6 C)  99.4 F (37.4 C)  TempSrc:   Oral Oral  SpO2: 99%     Weight:      Height:        Intake/Output Summary (Last 24 hours) at 08/22/2019 1334 Last data filed at 08/22/2019 1100 Gross per 24 hour  Intake 370 ml  Output --  Net 370 ml   Filed Weights   08/21/19 1301 08/22/19 0030  Weight: 75 kg 78.1 kg    Examination:  General exam: Appears  sick and lethargic. Deformities of the wrist and both legs with advanced rheumatoid arthritis changes. Respiratory system: Clear to auscultation. Respiratory effort normal.  No added sounds. Cardiovascular system: S1 & S2 heard, irregularly irregular.. Gastrointestinal system: Abdomen is nondistended, soft and nontender.  Central nervous system: Alert and oriented x1.  No focal neurological deficits. psychiatry: Judgement and insight appear normal. Mood & affect flat.    Data Reviewed: I have personally reviewed following labs and imaging studies  CBC: Recent Labs  Lab 08/21/19 1316 08/22/19 0430  WBC 13.9* 11.5*  HGB 11.4* 11.6*  HCT 36.2 36.1  MCV 105.2* 102.6*  PLT 187 173   Basic Metabolic Panel: Recent Labs  Lab 08/21/19 1316 08/21/19 1830 08/22/19 0430 08/22/19 1010  NA 131*  --  131* 128*  K 4.6  --  5.4* 6.3*  CL 89*  --  89* 87*  CO2 29  --  29 26  GLUCOSE 65*  --  143* 174*  BUN 140*  --  138* 146*  CREATININE 2.94*  --  3.01* 3.21*  CALCIUM 8.5*  --  8.7* 8.3*  MG  --  3.9*  --   --   PHOS  --  5.5*  --   --    GFR: Estimated Creatinine Clearance: 13.3 mL/min (A) (by C-G formula based on SCr of 3.21 mg/dL (H)). Liver Function Tests: Recent Labs  Lab 08/21/19 1316 08/22/19 0430  AST 27 25  ALT 29 32  ALKPHOS 99 103  BILITOT 0.6 0.8  PROT 5.7* 5.9*  ALBUMIN 3.0* 2.8*   Recent Labs  Lab 08/21/19 1316  LIPASE 19   No results for input(s): AMMONIA in the last 168 hours. Coagulation Profile: No results for input(s): INR, PROTIME in the last 168 hours. Cardiac Enzymes: No results for input(s): CKTOTAL, CKMB, CKMBINDEX, TROPONINI in the last 168 hours. BNP (last 3 results) No results for input(s): PROBNP in the last 8760 hours. HbA1C: No results for input(s): HGBA1C in the last 72 hours. CBG: Recent Labs  Lab 08/21/19 2041 08/22/19 0023 08/22/19 0430 08/22/19 0743 08/22/19 1123  GLUCAP 105* 146* 138* 167* 169*   Lipid Profile: No  results for input(s): CHOL, HDL, LDLCALC, TRIG, CHOLHDL, LDLDIRECT in the last 72 hours. Thyroid Function Tests: Recent Labs    08/21/19 1830  TSH 3.331   Anemia Panel: Recent Labs    08/21/19 1830  VITAMINB12 1,078*   Sepsis Labs: Recent Labs  Lab 08/21/19 1844  LATICACIDVEN 1.2    Recent Results (from the past 240 hour(s))  SARS CORONAVIRUS 2 (TAT 6-24 HRS) Nasopharyngeal Nasopharyngeal Swab     Status: None   Collection Time: 08/21/19  6:42 PM   Specimen: Nasopharyngeal Swab  Result Value Ref Range Status   SARS Coronavirus 2 NEGATIVE NEGATIVE Final    Comment: (NOTE) SARS-CoV-2 target nucleic acids are NOT DETECTED. The SARS-CoV-2 RNA is generally detectable in upper and lower respiratory specimens during the acute phase of infection. Negative results do not preclude  SARS-CoV-2 infection, do not rule out co-infections with other pathogens, and should not be used as the sole basis for treatment or other patient management decisions. Negative results must be combined with clinical observations, patient history, and epidemiological information. The expected result is Negative. Fact Sheet for Patients: HairSlick.no Fact Sheet for Healthcare Providers: quierodirigir.com This test is not yet approved or cleared by the [place] FDA and  has been authorized for detection and/or diagnosis of SARS-CoV-2 by FDA under an Emergency Use Authorization (EUA). This EUA will remain  in effect (meaning this test can be used) for the duration of the COVID-19 declaration under Section 56 4(b)(1) of the Act, 21 U.S.C. section 360bbb-3(b)(1), unless the authorization is terminated or revoked sooner. Performed at [facility], [address], [place], [place] [address]   Culture, blood (routine x 2)     Status: None (Preliminary result)   Collection Time: 08/21/19  6:49 PM   Specimen: BLOOD  Result Value Ref Range Status    Specimen Description BLOOD SITE NOT SPECIFIED  Final   Special Requests   Final    BOTTLES DRAWN AEROBIC AND ANAEROBIC Blood Culture adequate volume   Culture   Final    NO GROWTH < 24 HOURS Performed at [facility], [address], [place], [place] [address]    Report Status PENDING  Incomplete         Radiology Studies: CT ABDOMEN PELVIS WO CONTRAST  Result Date: 08/21/2019 CLINICAL DATA:  Early satiety acute renal failure EXAM: CT ABDOMEN AND PELVIS WITHOUT CONTRAST TECHNIQUE: Multidetector CT imaging of the abdomen and pelvis was performed following the standard protocol without IV contrast. COMPARISON:  Ultrasound 08/21/2019, CT 07/30/2019 FINDINGS: Lower chest: Lung bases demonstrate trace left effusion. 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 Cardiac size within normal limits. Mitral calcification. Coronary calcification. Borderline to mild cardiomegaly. Hepatobiliary: Status post cholecystectomy. No focal hepatic abnormality or biliary dilatation Pancreas: Unremarkable. No pancreatic ductal dilatation or surrounding inflammatory changes. Spleen: Normal in size without focal abnormality. Adrenals/Urinary Tract: Adrenal glands are normal. Kidneys show no hydronephrosis. Negative for stone disease. Bladder is normal Stomach/Bowel: Stomach is within normal limits. Appendix not well seen but no right lower quadrant inflammatory process. Patient has history of prior appendectomy. Large volume of stool in the colon. No evidence of bowel wall thickening, distention, or inflammatory changes. Vascular/Lymphatic: Extensive aortic atherosclerosis without aneurysm. No suspicious adenopathy. Reproductive: Uterus and bilateral adnexa are unremarkable. Other: Negative for free air or free fluid. Generalized subcutaneous edema. Musculoskeletal: Chronic compression deformities at L2, L3 and L4. IMPRESSION: 1. No CT  evidence for acute intra-abdominal or pelvic abnormality. Negative for hydronephrosis or urinary tract calculus. 2. 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   On: 08/21/2019 20:29   US Renal  Result Date: 08/21/2019 CLINICAL DATA:  Acute renal failure on chronic kidney disease EXAM: RENAL / URINARY TRACT ULTRASOUND COMPLETE COMPARISON:  CT 09/04/2018 FINDINGS: Right Kidney: Renal measurements: 8 x 5.1 x 4.6 cm = volume: 98.3 mL. Slightly echogenic cortex. No hydronephrosis. Suspected mild cortical thinning. Possible tiny cyst at the upper pole measuring 1 cm. Left Kidney: Renal measurements: 10.3 x 5.2 x 5.7 cm = volume: 159.1 mL. Slightly echogenic cortex. No hydronephrosis or mass. Borderline to mild cortical thinning. Bladder: Limited visualization due to habitus.  Grossly normal. Other: None. IMPRESSION: 1. Kidneys appear  slightly echogenic suggesting medical renal disease. No hydronephrosis. Probable mild cortical thinning/atrophy of both kidneys. Electronically Signed   By: Jasmine Pang M.D.   On: 08/21/2019 18:26   DG CHEST PORT 1 VIEW  Result Date: 08/21/2019 CLINICAL DATA:  Missed dialysis. Clinical concern for fluid overload. EXAM: PORTABLE CHEST 1 VIEW COMPARISON:  08/02/2019 FINDINGS: Stable poor inspiration and borderline enlarged cardiac silhouette. Decreased prominence of the pulmonary vasculature. Mild increase in prominence of the interstitial markings, including some Kerley lines. Small right pleural effusion, decreased. Bilateral shoulder degenerative changes and superior migration of the humeral heads, compatible with large, chronic bilateral rotator cuff tears. IMPRESSION: Mild changes of congestive heart failure with interval mild interstitial pulmonary edema. Electronically Signed   By: Beckie Salts M.D.   On: 08/21/2019 18:59   VAS US RENAL ARTERY DUPLEX  Result Date: 08/21/2019 ABDOMINAL VISCERAL  Indications: Uncontrolled hypertension. Patient indicates she is not urinating              very much these last 2 days. She is having upper abdominal pain and              feels very weak. She is having swelling throughout her entire body. High Risk Factors: Hypertension, Diabetes, no history of smoking, coronary                    artery disease. Other Factors: CKD stage 3                 Most recent blood work 07/24/2019                BUN 98                Creatinine 1.86. Limitations: Air/bowel gas, obesity and patient discomfort. Comparison Study: None Performing Technologist: Alecia Mackin RVT, RDCS (AE), RDMS  Examination Guidelines: A complete evaluation includes B-mode imaging, spectral Doppler, color Doppler, and power Doppler as needed of all accessible portions of each vessel. Bilateral testing is considered an integral part of a complete examination. Limited examinations for reoccurring indications may be performed as noted.  Duplex Findings: +----------------------+--------+--------+--------+--------+ Mesenteric            PSV cm/sEDV cm/s Plaque Comments +----------------------+--------+--------+--------+--------+ Aorta Prox               74           calcific         +----------------------+--------+--------+--------+--------+ Aorta Distal            100           calcific         +----------------------+--------+--------+--------+--------+ Celiac Artery Origin    114      15                    +----------------------+--------+--------+--------+--------+ Celiac Artery Proximal  139      10                    +----------------------+--------+--------+--------+--------+ SMA Proximal            209      26                    +----------------------+--------+--------+--------+--------+  +------------------+--------+--------+-------------------------------+ Right Renal ArteryPSV cm/sEDV cm/s            Comment              +------------------+--------+--------+-------------------------------+ Origin  not seen due to overlying bowel +------------------+--------+--------+-------------------------------+ Proximal                                     not seen             +------------------+--------+--------+-------------------------------+ Mid                 161      14                                   +------------------+--------+--------+-------------------------------+ Distal               48      1                                    +------------------+--------+--------+-------------------------------+ +-----------------+--------+--------+-------+ Left Renal ArteryPSV cm/sEDV cm/sComment +-----------------+--------+--------+-------+ Origin              97      19           +-----------------+--------+--------+-------+ Proximal            88      21           +-----------------+--------+--------+-------+ Mid                 80      14           +-----------------+--------+--------+-------+ Distal              57      11           +-----------------+--------+--------+-------+  Technologist observations: Technically limited exam due to overlying bowel. +------------+--------+--------+----+-----------+--------+--------+----+ Right KidneyPSV cm/sEDV cm/sRI  Left KidneyPSV cm/sEDV cm/sRI   +------------+--------+--------+----+-----------+--------+--------+----+ Upper Pole  29      0       1.00Upper Pole 19      0       0.98 +------------+--------+--------+----+-----------+--------+--------+----+ Mid         18      0       1.        19      1       0.97 +------------+--------+--------+----+-----------+--------+--------+----+ Lower Pole  22      0       0.99Lower Pole 16      0       0.99 +------------+--------+--------+----+-----------+--------+--------+----+ Hilar       36      0       0.99Hilar      26      0        1.00 +------------+--------+--------+----+-----------+--------+--------+----+ +------------------+--------+------------------+--------+ Right Kidney              Left Kidney                +------------------+--------+------------------+--------+ RAR                       RAR                        +------------------+--------+------------------+--------+ RAR (manual)      2.1     RAR (manual)      1.3      +------------------+--------+------------------+--------+ Cortex  Cortex                     +------------------+--------+------------------+--------+ Cortex thickness  12.00 mmCorex thickness   11.00 mm +------------------+--------+------------------+--------+ Kidney length (cm)9.40    Kidney length (cm)10.60    +------------------+--------+------------------+--------+  Findings reported to Dr. Duke Salvia at 12:00 pm . Summary: Largest Aortic Diameter: 2.2 cm  Renal:  Right: Normal size right kidney. Abnormal right Resistive Index.        Normal cortical thickness of right kidney. RRV flow present.        From what was seen there is no evidence of renal artery        stenosis. Left:  Normal size of left kidney. Abnormal left Resisitve Index.        Normal cortical thickness of the left kidney. LRV flow        present. No evidence of left renal artery stenosis. Mesenteric: Normal Celiac artery and Superior Mesenteric artery findings. Areas of limited visceral study include right renal artery. Moderate aortic wall calcification. There is no evidence of diastolic flow seen in the segtmental arterial dopplers in both kidneys.  *See table(s) above for measurements and observations.  Diagnosing physician: Julien Nordmann MD  Electronically signed by Julien Nordmann MD on 08/21/2019 at 6:02:21 PM.    Final         Scheduled Meds: . aspirin  81 mg Oral Daily  . Chlorhexidine Gluconate Cloth  6 each Topical Q0600  . heparin injection (subcutaneous)  5,000 Units  Subcutaneous Q8H  . heparin sodium (porcine)      . levothyroxine  88 mcg Oral QAC breakfast  . lidocaine      . mouth rinse  15 mL Mouth Rinse BID  . pantoprazole  40 mg Oral Daily  . patiromer  16.8 g Oral Daily  . predniSONE  5 mg Oral BID   Continuous Infusions: . calcium gluconate 1 GM IVPB       LOS: 1 day    Time spent: 30 minutes    Dorcas Carrow, MD [facility] Pager [phone]

## 2019-08-22 NOTE — Progress Notes (Signed)
OT Cancellation Note  Patient Details Name: [name] MRN: [id number] DOB: 06/02/1940   Cancelled Treatment:    Reason Eval/Treat Not Completed: Patient at procedure or test/ unavailable Pt out of room for test. Will continue to follow as available and appropriate.  Dalphine Handing, MSOT, OTR/L Acute Rehabilitation Services [facility] Office Number: [phone] Pager: [phone]  Dalphine Handing 08/22/2019, 4:30 PM

## 2019-08-22 NOTE — ED Notes (Signed)
SDU  Paged floor coverage for RN [facility]

## 2019-08-22 NOTE — Progress Notes (Signed)
SLP Cancellation Note  Patient Details Name: [name] MRN: [id number] DOB: [birth year]   Cancelled treatment:       Reason Eval/Treat Not Completed: Patient at procedure or test/unavailable     Mahala Menghini., M.A. CCC-SLP Acute Rehabilitation Services Pager [phone] Office [phone]  08/22/2019, 3:23 PM

## 2019-08-22 NOTE — ED Notes (Signed)
Per MD keep warm blankets on the patient and get updated CBG.
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
## 2019-08-23 ENCOUNTER — Ambulatory Visit: Payer: Medicare Other | Admitting: Cardiovascular Disease

## 2019-08-23 DIAGNOSIS — Z7189 Other specified counseling: Secondary | ICD-10-CM

## 2019-08-23 DIAGNOSIS — N179 Acute kidney failure, unspecified: Secondary | ICD-10-CM

## 2019-08-23 LAB — COMPREHENSIVE METABOLIC PANEL
ALT: 31 U/L (ref 0–44)
AST: 32 U/L (ref 15–41)
Albumin: 3.4 g/dL — ABNORMAL LOW (ref 3.5–5.0)
Alkaline Phosphatase: 148 U/L — ABNORMAL HIGH (ref 38–126)
Anion gap: 17 — ABNORMAL HIGH (ref 5–15)
BUN: 72 mg/dL — ABNORMAL HIGH (ref 8–23)
CO2: 26 mmol/L (ref 22–32)
Calcium: 9.3 mg/dL (ref 8.9–10.3)
Chloride: 92 mmol/L — ABNORMAL LOW (ref 98–111)
Creatinine, Ser: 1.95 mg/dL — ABNORMAL HIGH (ref 0.44–1.00)
GFR calc Af Amer: 28 mL/min — ABNORMAL LOW (ref >60)
GFR calc non Af Amer: 24 mL/min — ABNORMAL LOW (ref >60)
Glucose, Bld: 160 mg/dL — ABNORMAL HIGH (ref 70–99)
Potassium: 4.5 mmol/L (ref 3.5–5.1)
Sodium: 135 mmol/L (ref 135–145)
Total Bilirubin: 1.4 mg/dL — ABNORMAL HIGH (ref 0.3–1.2)
Total Protein: 6.6 g/dL (ref 6.5–8.1)

## 2019-08-23 LAB — CBC WITH DIFFERENTIAL/PLATELET
Abs Immature Granulocytes: 0.07 10*3/uL (ref 0.00–0.07)
Basophils Absolute: 0 10*3/uL (ref 0.0–0.1)
Basophils Relative: 0 %
Eosinophils Absolute: 0 10*3/uL (ref 0.0–0.5)
Eosinophils Relative: 0 %
HCT: 36.6 % (ref 36.0–46.0)
Hemoglobin: 11.6 g/dL — ABNORMAL LOW (ref 12.0–15.0)
Immature Granulocytes: 1 %
Lymphocytes Relative: 6 %
Lymphs Abs: 0.6 10*3/uL — ABNORMAL LOW (ref 0.7–4.0)
MCH: 33 pg (ref 26.0–34.0)
MCHC: 31.7 g/dL (ref 30.0–36.0)
MCV: 104.3 fL — ABNORMAL HIGH (ref 80.0–100.0)
Monocytes Absolute: 0.7 10*3/uL (ref 0.1–1.0)
Monocytes Relative: 8 %
Neutro Abs: 8.1 10*3/uL — ABNORMAL HIGH (ref 1.7–7.7)
Neutrophils Relative %: 85 %
Platelets: 172 10*3/uL (ref 150–400)
RBC: 3.51 MIL/uL — ABNORMAL LOW (ref 3.87–5.11)
RDW: 13.8 % (ref 11.5–15.5)
WBC: 9.5 10*3/uL (ref 4.0–10.5)
nRBC: 0.2 % (ref 0.0–0.2)

## 2019-08-23 LAB — GLUCOSE, CAPILLARY
Glucose-Capillary: 144 mg/dL — ABNORMAL HIGH (ref 70–99)
Glucose-Capillary: 125 mg/dL — ABNORMAL HIGH (ref 70–99)
Glucose-Capillary: 171 mg/dL — ABNORMAL HIGH (ref 70–99)
Glucose-Capillary: 212 mg/dL — ABNORMAL HIGH (ref 70–99)
Glucose-Capillary: 211 mg/dL — ABNORMAL HIGH (ref 70–99)

## 2019-08-23 LAB — PHOSPHORUS: Phosphorus: 4.5 mg/dL (ref 2.5–4.6)

## 2019-08-23 LAB — MAGNESIUM: Magnesium: 3.4 mg/dL — ABNORMAL HIGH (ref 1.7–2.4)

## 2019-08-23 MED ORDER — POLYVINYL ALCOHOL 1.4 % OP SOLN
1.0000 [drp] | OPHTHALMIC | Status: DC | PRN
  Administered 2019-08-23 – 2019-08-24 (×2): 1 [drp] via OPHTHALMIC
  Filled 2019-08-23: qty 15

## 2019-08-23 MED ORDER — OXYCODONE HCL ER 10 MG PO T12A
10.0000 mg | EXTENDED_RELEASE_TABLET | Freq: Two times a day (BID) | ORAL | Status: DC
  Administered 2019-08-23 – 2019-08-25 (×5): 10 mg via ORAL
  Filled 2019-08-23 (×5): qty 1

## 2019-08-23 MED ORDER — DIAZEPAM 2 MG PO TABS
2.0000 mg | ORAL_TABLET | Freq: Two times a day (BID) | ORAL | Status: DC | PRN
  Administered 2019-08-24: 04:00:00 2 mg via ORAL
  Filled 2019-08-23: qty 1

## 2019-08-23 MED ORDER — GABAPENTIN 100 MG PO CAPS
100.0000 mg | ORAL_CAPSULE | Freq: Two times a day (BID) | ORAL | Status: DC
  Administered 2019-08-23 – 2019-08-25 (×5): 100 mg via ORAL
  Filled 2019-08-23 (×5): qty 1

## 2019-08-23 MED ORDER — OXYCODONE HCL 5 MG PO TABS: 5.0000 mg | ORAL_TABLET | Freq: Three times a day (TID) | ORAL | Status: DC | PRN

## 2019-08-23 NOTE — Progress Notes (Signed)
[facility] Progress Note    Assessment/ Plan:   Assessment Acute metabolic encephalopthy 2/2 acute renal failure Patient with improved AMS this morning, A&O x 4. VSS overnight with blood pressures returning to patient's baseline after remaining soft all day yesterday .Overnight 700cc UOP.  AM labs with K 4.5, Cr 1.95, BUN 72 which is much improved. AG continues to rise, 17. Change in bicarb minimal and suspect continued pure AGMA likely due to renal failure.  Plan  . D/c veltassa  . Continue to follow UOP . Continue to monitor mental status  . Continue temp cath  . Continue holding diuretics and nephrotoxic meds  Consider renal dosing for current medications   AM RFP   Subjective:   Patient is tearful this morning and ruminates on wanting to go home. She is informed that she is not a candidate to go home at this time.    Objective:   BP (!) 153/74 (BP Location: Right Arm)   Pulse 78   Temp 97.7 F (36.5 C) (Oral)   Resp (!) 22   Ht 4\' 11"  (1.499 m)   Wt 79.4 kg   LMP  (LMP Unknown)   SpO2 95%   BMI 35.35 kg/m  Intake/Output      04/21 0701 - 04/22 0700 04/22 0701 - 04/23 0700   P.O. 120    I.V. (mL/kg) 0 (0)    IV Piggyback 50    Total Intake(mL/kg) 170 (2.1)    Urine (mL/kg/hr) 700 (0.4)    Other -138    Total Output 562    Net -392          Filed Weights   08/22/19 1343 08/22/19 1624 08/23/19 0631  Weight: 80.5 kg 80.7 kg 79.4 kg   Weight change: 5.5 kg  Physical Exam: Gen: alert and oriented x 4, no myoclonus today  SFK:[id number] irregular Resp:expiratory wheezing throughout  Abd: +BS, NTTP CBS:WHQPR 2-3+ throughout extremities. Left pressure ulcer on heel palpated. No skin breakdown appreciated. Area is soft er compared to surrounding tissue.   Imaging: IR US Guide Vasc Access Right Result Date: 08/22/2019 CLINICAL DATA:  Acute kidney injury, needs access for hemodialysis EXAM: EXAM RIGHT IJ CATHETER PLACEMENT UNDER ULTRASOUND AND  FLUOROSCOPIC GUIDANCE TECHNIQUE: The procedure, risks (including but not limited to bleeding, infection, organ damage, pneumothorax), benefits, and alternatives were explained to the patient. Questions regarding the procedure were encouraged and answered. The patient understands and consents to the procedure. Patency of the right IJ vein was confirmed with ultrasound with image documentation. An appropriate skin site was determined. Skin site was marked. Region was prepped using maximum barrier technique including cap and mask, sterile gown, sterile gloves, large sterile sheet, and Chlorhexidine as cutaneous antisepsis. The region was infiltrated locally with 1% lidocaine. Under real-time ultrasound guidance, the right IJ vein was accessed with a 21 gauge needle; the needle tip within the vein was confirmed with ultrasound image documentation. The needle exchanged over a 018 guidewire for vascular dilator which allowed advancement of a 16 cm Mahurkar catheter. This was positioned with the tip at the cavoatrial junction. Spot chest radiograph shows good positioning and no pneumothorax. Catheter was flushed and sutured externally with 0-Prolene sutures. Patient tolerated the procedure well. FLUOROSCOPY TIME:  Less than 6 seconds; 9.16 mGy COMPLICATIONS: COMPLICATIONS none IMPRESSION: 1. Technically successful right IJ Mahurkar catheter placement. Electronically Signed   By: Lucrezia Europe M.D.   On: 08/22/2019 15:51    Labs: [employer] Recent Labs  Lab 08/21/19 1316 08/21/19 1830 08/22/19 0430 08/22/19 1010  NA 131*  --  131* 128*  K 4.6  --  5.4* 6.3*  CL 89*  --  89* 87*  CO2 29  --  29 26  GLUCOSE 65*  --  143* 174*  BUN 140*  --  138* 146*  CREATININE 2.94*  --  3.01* 3.21*  CALCIUM 8.5*  --  8.7* 8.3*  PHOS  --  5.5*  --   --    CBC Recent Labs  Lab 08/21/19 1316 08/21/19 1830 08/22/19 0430 08/23/19 1030  WBC 13.9*  --  11.5* 9.5  NEUTROABS  --   --   --  8.1*  HGB 11.4*  --  11.6* 11.6*  HCT  36.2 31.0* 36.1 36.6  MCV 105.2*  --  102.6* 104.3*  PLT 187  --  173 172    Medications:    . aspirin  81 mg Oral Daily  . Chlorhexidine Gluconate Cloth  6 each Topical Q0600  . gabapentin  100 mg Oral BID  . heparin injection (subcutaneous)  5,000 Units Subcutaneous Q8H  . levothyroxine  88 mcg Oral QAC breakfast  . mouth rinse  15 mL Mouth Rinse BID  . oxyCODONE  10 mg Oral Q12H  . pantoprazole  40 mg Oral Daily  . patiromer  16.8 g Oral Daily  . predniSONE  5 mg Oral BID    Genia Hotter, MD [facility] Family Medicine Resident, PGY2 08/23/2019, 11:10 AM
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
## 2019-08-23 NOTE — Evaluation (Signed)
Occupational Therapy Evaluation Patient Details Name: [name] MRN: [id number] DOB: 1940-06-20 Today's Date: 08/23/2019    History of Present Illness 79 year old female with history of rheumatoid arthritis on chronic steroids, hypertension, coronary artery disease, paroxysmal A. fib on Eliquis, CVA, chronic diastolic congestive heart failure, CKD stage IIIa, Sjogren's syndrome, recurrent pleural effusion, pressure ulcer on the left foot presented to the emergency room with worsening kidney functions confusion from cardiologist office.     Clinical Impression   Pt admitted with the above diagnoses and presents with below problem list. Pt will benefit from continued acute OT to address the below listed deficits and maximize independence with basic ADLs prior to d/c home with 24/7 assist. PTA pt was max A with basic ADLs (including feeding), min guard A with household distance mobility utilizing walker. Pt reports she has caregiver assist 24/7 at home. Currently she is max A with basic ADLs and stand-pivot toilet transfer to [facility]. Pt's O2 sat noted to drop to 85 while sitting EOB on RA, reapplied [place] with sat recovering to mid 90s.     Follow Up Recommendations  Home health OT;Supervision/Assistance - 24 hour    Equipment Recommendations  None recommended by OT    Recommendations for Other Services PT consult     Precautions / Restrictions Precautions Precautions: Fall Restrictions Weight Bearing Restrictions: No      Mobility Bed Mobility Overal bed mobility: Needs Assistance Bed Mobility: Supine to Sit     Supine to sit: Max assist;HOB elevated     General bed mobility comments: mod A to advance BLE fully off EOB, max A to powerup trunk and pivot hips to EOB position, used bed pad to faciliate movements  Transfers Overall transfer level: Needs assistance Equipment used: Rolling walker (2 wheeled) Transfers: Sit to/from [employer] Sit to Stand: Max  assist;From elevated surface Stand pivot transfers: Mod assist;Max assist;From elevated surface       General transfer comment: assist to powerup, steady, and control descent. some assist to advance rw as well.     Balance Overall balance assessment: Needs assistance Sitting-balance support: Bilateral upper extremity supported;Feet supported Sitting balance-Leahy Scale: Poor Sitting balance - Comments: sits with BUE support   Standing balance support: Bilateral upper extremity supported Standing balance-Leahy Scale: Poor Standing balance comment: rw and min steadying assist in static stand                           ADL either performed or assessed with clinical judgement   ADL Overall ADL's : Needs assistance/impaired Eating/Feeding: Maximal assistance;Sitting   Grooming: Maximal assistance;Sitting   Upper Body Bathing: Maximal assistance;Sitting   Lower Body Bathing: Maximal assistance;Sitting/lateral leans;Sit to/from stand   Upper Body Dressing : Maximal assistance;Sitting   Lower Body Dressing: Maximal assistance;Sitting/lateral leans;Sit to/from stand   Toilet Transfer: Maximal assistance;Stand-pivot;BSC;RW Toilet Transfer Details (indicate cue type and reason): simulated with EOB>recliner Toileting- Clothing Manipulation and Hygiene: Maximal assistance;Sit to/from Nurse, children's Details (indicate cue type and reason): sponge bathes at baseline   General ADL Comments: Pt completed bed mobility, pericare, and simulated toilet transfer as detailed above.     Vision         Perception     Praxis      Pertinent Vitals/Pain Pain Assessment: Faces Faces Pain Scale: Hurts even more Pain Location: back, generalized Pain Descriptors / Indicators: Constant;Aching Pain Intervention(s): Monitored during session;Limited activity within patient's tolerance;Repositioned  Hand Dominance     Extremity/Trunk Assessment Upper Extremity  Assessment Upper Extremity Assessment: Generalized weakness;RUE deficits/detail;LUE deficits/detail RUE Deficits / Details: baseline arthritic deformity of joints limiting functional ROM  RUE Coordination: decreased fine motor;decreased gross motor LUE Deficits / Details: baseline arthritic deformity of joints limiting functional ROM LUE Coordination: decreased fine motor;decreased gross motor   Lower Extremity Assessment Lower Extremity Assessment: Defer to PT evaluation;Generalized weakness(endorses neuropathy)   Cervical / Trunk Assessment Cervical / Trunk Assessment: Kyphotic   Communication Communication Communication: No difficulties   Cognition Arousal/Alertness: Awake/alert Behavior During Therapy: Flat affect Overall Cognitive Status: No family/caregiver present to determine baseline cognitive functioning                                 General Comments: Initially asking where she was. able to state "[place]" when asked later in session. correctly stated month and year.    General Comments       Exercises     Shoulder Instructions      Home Living Family/patient expects to be discharged to:: Private residence Living Arrangements: Children;Other (Comment)(lives with daughter and has 24/7 [place] aide) Available Help at Discharge: Family;Personal care attendant;Available 24 hours/day Type of Home: House Home Access: Stairs to enter [employer] of Steps: 1   Home Layout: One level     Bathroom Shower/Tub: Other (comment)(sponge bathes)         Home Equipment: Walker - 2 wheels;Hospital bed          Prior Functioning/Environment Level of Independence: Needs assistance  Gait / Transfers Assistance Needed: rw and min guard A for household ambulation ADL's / Homemaking Assistance Needed: max A with UB/LB bathing and dressing. assist with feeding, grooming and bed mobility.             OT Problem List: Decreased strength;Decreased  activity tolerance;Impaired balance (sitting and/or standing);Decreased coordination;Decreased knowledge of use of DME or AE;Decreased knowledge of precautions;Cardiopulmonary status limiting activity;Obesity;Impaired UE functional use;Pain      OT Treatment/Interventions: Self-care/ADL training;Therapeutic exercise;DME and/or AE instruction;Energy conservation;Therapeutic activities;Patient/family education;Balance training    OT Goals(Current goals can be found in the care plan section) Acute Rehab OT Goals Patient Stated Goal: feel better OT Goal Formulation: With patient Time For Goal Achievement: 09/06/19 Potential to Achieve Goals: Good ADL Goals Pt Will Transfer to Toilet: with min assist;ambulating Pt Will Perform Toileting - Clothing Manipulation and hygiene: sit to/from stand;with mod assist Additional ADL Goal #1: Pt will complete bed mobility at mod A level to prepare for EOB/OOB ADLs. Additional ADL Goal #2: Pt will sit EOB for 5 minutes during ADL activity at supervision level.  OT Frequency: Min 2X/week   Barriers to D/C:            Co-evaluation              AM-PAC OT "6 Clicks" Daily Activity     Outcome Measure Help from another person eating meals?: A Lot Help from another person taking care of personal grooming?: A Lot Help from another person toileting, which includes using toliet, bedpan, or urinal?: A Lot Help from another person bathing (including washing, rinsing, drying)?: A Lot Help from another person to put on and taking off regular upper body clothing?: A Lot Help from another person to put on and taking off regular lower body clothing?: A Lot 6 Click Score: 12   End of Session Equipment Utilized  During Treatment: Gait belt;Rolling walker;Oxygen  Activity Tolerance: Patient limited by fatigue;Patient limited by pain;Patient tolerated treatment well Patient left: in chair;with call bell/phone within reach  OT Visit Diagnosis: Unsteadiness on  feet (R26.81);Muscle weakness (generalized) (M62.81);Pain                Time: 6979-4801 OT Time Calculation (min): 32 min Charges:  OT General Charges $OT Visit: 1 Visit OT Evaluation $OT Eval Moderate Complexity: 1 Mod OT Treatments $Self Care/Home Management : 8-22 mins  Raynald Kemp, OT Acute Rehabilitation Services Pager: [phone] Office: [phone]   Pilar Grammes 08/23/2019, 11:30 AM

## 2019-08-23 NOTE — Progress Notes (Signed)
PROGRESS NOTE    [name]  WEX:[id number] DOB: [birth year] DOA: 08/21/2019 PCP: Buckner Malta, MD    Brief Narrative:  79 year old female with history of rheumatoid arthritis on chronic steroids, hypertension, coronary artery disease, paroxysmal A. fib on Eliquis, CVA, chronic diastolic congestive heart failure, CKD stage IIIa, Sjogren's syndrome, recurrent pleural effusion, pressure ulcer on the left foot presented to the emergency room with worsening kidney functions confusion from cardiologist office.  Patient lives at home with 24-hour care and has recently been not feeling well, extremely tired and not having any urine output for the last 24 hours.  Patient does have chronic problems and brain leg swellings and hand swellings.  She also had recently changed medications including addition of valsartan. In the emergency room, blood pressures were low normal.  Afebrile.  Leukocytosis present.  Sodium 131, BUN 140, creatinine 2.94.  COVID-19 negative.  Renal ultrasound and CT scan of the abdomen pelvis no evidence of obstruction.   Assessment & Plan:   Principal Problem:   Acute worsening of stage 3 chronic kidney disease Active Problems:   Rheumatoid arthritis (HCC)   PAF (paroxysmal atrial fibrillation) (HCC)   Macrocytic anemia   Hyperlipidemia   Hypertension   Hypothyroidism   DM (diabetes mellitus) (HCC)   Chronic diastolic heart failure (HCC)   Hyponatremia   Hypoglycemia   Leukocytosis   Worsening renal function  Acute renal failure on history of chronic kidney disease stage IIIa: Acute metabolic encephalopathy Patient presented with uremic symptoms and anuria hyperkalemia. Renal ultrasound negative for obstruction. Continue monitoring, stopping all antihypertensive medications.  Received temporary dialysis catheter, 1 session of hemodialysis on 4/21.  Mentation improved after dialysis. Followed by nephrology for further dialysis needs. Urine output 700 mL last  24 hours.  BUN/creatinine improving.  Acute metabolic encephalopathy: Due to above.  No focal deficit.  Mental status improved.  Hypoglycemia and hypothermia: Likely secondary to worsening kidney functions.  Stop all long-acting insulin.  Will keep on monitoring.  Temperature and blood sugars improved.  Hyponatremia: Due to #1.  Will monitor.  Improved.  Paroxysmal A. fib: Rate controlled.  Not on any anticoagulation as mentioned above.  Rheumatoid arthritis/Sjogren's syndrome: On p.o. steroids.  Continue.  Stress dose of steroids given 4/21. Patient also on chronic pain management.  She takes oxycodone both long-acting and short-acting.  Also takes gabapentin. She is having severe pain mostly on the her back and hands today, will resume her long-acting oxycodone, decrease dose of short acting oxycodone.  Gabapentin, doses to decrease for ESRD.  If she makes adequate urine, will put her back on her usual doses.  She also takes Valium for muscle spasms that will be resumed.  Ambulate with PT OT.  Continue monitoring in the hospital for urine output and renal function recovery.  DVT prophylaxis: SCDs Code Status: Full code Family Communication: Patient's daughter on the phone. Disposition Plan: Status is: Inpatient  Remains inpatient appropriate because:Persistent severe electrolyte disturbances and Altered mental status   Dispo: The patient is from: Home              Anticipated d/c is to: Home              Anticipated d/c date is: 2 days.              Patient currently is not medically stable to d/c.      Consultants:   Nephrology  Procedures:   Right IJ temporary dialysis catheter 4/19  Hemodialysis 4/19  Antimicrobials:   None   Subjective: Patient seen and examined in the morning rounds.  Complains of difficult to sleep on this bed.  She is more awake and alert.  She wants to go home.  "It is my right to choose to go home" We discussed about different issues going  with her and plan to let her go home as soon as possible as she is safe. Back hurts.  Arms and legs are swollen and edematous and they are always like that as per her.  Objective: Vitals:   08/22/19 1700 08/22/19 2035 08/22/19 2348 08/23/19 0631  BP:  (!) 140/47 (!) 156/50 (!) 153/74  Pulse:  83 60 78  Resp:  16 18 (!) 22  Temp: (!) 97.4 F (36.3 C) 98.4 F (36.9 C) 98.2 F (36.8 C) 97.7 F (36.5 C)  TempSrc:  Oral Oral Oral  SpO2:  96% 97% 95%  Weight:    79.4 kg  Height:        Intake/Output Summary (Last 24 hours) at 08/23/2019 1308 Last data filed at 08/23/2019 0636 Gross per 24 hour  Intake 50 ml  Output 562 ml  Net -512 ml   Filed Weights   08/22/19 1343 08/22/19 1624 08/23/19 0631  Weight: 80.5 kg 80.7 kg 79.4 kg    Examination:  General exam: Chronically sick looking, on room air.  Alert and oriented x3. Deformities of the wrist and both legs with advanced rheumatoid arthritis changes.  Anasarca present. Respiratory system: Clear to auscultation. Respiratory effort normal.  No added sounds. Cardiovascular system: S1 & S2 heard, irregularly irregular.. Gastrointestinal system: Abdomen is nondistended, soft and nontender.  Central nervous system: Alert and oriented x 3.  No focal neurological deficits. psychiatry: Judgement and insight appear normal. Mood & affect flat and anxious.    Data Reviewed: I have personally reviewed following labs and imaging studies  CBC: Recent Labs  Lab 08/21/19 1316 08/21/19 1830 08/22/19 0430 08/23/19 1030  WBC 13.9*  --  11.5* 9.5  NEUTROABS  --   --   --  8.1*  HGB 11.4*  --  11.6* 11.6*  HCT 36.2 31.0* 36.1 36.6  MCV 105.2*  --  102.6* 104.3*  PLT 187  --  173 172   Basic Metabolic Panel: Recent Labs  Lab 08/21/19 1316 08/21/19 1830 08/22/19 0430 08/22/19 1010 08/23/19 1030  NA 131*  --  131* 128* 135  K 4.6  --  5.4* 6.3* 4.5  CL 89*  --  89* 87* 92*  CO2 29  --  29 26 26   GLUCOSE 65*  --  143* 174* 160*    BUN 140*  --  138* 146* 72*  CREATININE 2.94*  --  3.01* 3.21* 1.95*  CALCIUM 8.5*  --  8.7* 8.3* 9.3  MG  --  3.9*  --   --  3.4*  PHOS  --  5.5*  --   --  4.5   GFR: Estimated Creatinine Clearance: 22 mL/min (A) (by C-G formula based on SCr of 1.95 mg/dL (H)). Liver Function Tests: Recent Labs  Lab 08/21/19 1316 08/22/19 0430 08/23/19 1030  AST 27 25 32  ALT 29 32 31  ALKPHOS 99 103 148*  BILITOT 0.6 0.8 1.4*  PROT 5.7* 5.9* 6.6  ALBUMIN 3.0* 2.8* 3.4*   Recent Labs  Lab 08/21/19 1316  LIPASE 19   No results for input(s): AMMONIA in the last 168 hours. Coagulation Profile: No results for input(s): INR, PROTIME in  the last 168 hours. Cardiac Enzymes: No results for input(s): CKTOTAL, CKMB, CKMBINDEX, TROPONINI in the last 168 hours. BNP (last 3 results) No results for input(s): PROBNP in the last 8760 hours. HbA1C: No results for input(s): HGBA1C in the last 72 hours. CBG: Recent Labs  Lab 08/22/19 2029 08/22/19 2343 08/23/19 0621 08/23/19 0758 08/23/19 1206  GLUCAP 143* 121* 144* 125* 171*   Lipid Profile: No results for input(s): CHOL, HDL, LDLCALC, TRIG, CHOLHDL, LDLDIRECT in the last 72 hours. Thyroid Function Tests: Recent Labs    08/21/19 1830  TSH 3.331   Anemia Panel: Recent Labs    08/21/19 1830  VITAMINB12 1,078*   Sepsis Labs: Recent Labs  Lab 08/21/19 1844  LATICACIDVEN 1.2    Recent Results (from the past 240 hour(s))  SARS CORONAVIRUS 2 (TAT 6-24 HRS) Nasopharyngeal Nasopharyngeal Swab     Status: None   Collection Time: 08/21/19  6:42 PM   Specimen: Nasopharyngeal Swab  Result Value Ref Range Status   SARS Coronavirus 2 NEGATIVE NEGATIVE Final    Comment: (NOTE) SARS-CoV-2 target nucleic acids are NOT DETECTED. The SARS-CoV-2 RNA is generally detectable in upper and lower respiratory specimens during the acute phase of infection. Negative results do not preclude SARS-CoV-2 infection, do not rule out co-infections with  other pathogens, and should not be used as the sole basis for treatment or other patient management decisions. Negative results must be combined with clinical observations, patient history, and epidemiological information. The expected result is Negative. Fact Sheet for Patients: SugarRoll.be Fact Sheet for Healthcare Providers: https://www.woods-mathews.com/ This test is not yet approved or cleared by the [place] FDA and  has been authorized for detection and/or diagnosis of SARS-CoV-2 by FDA under an Emergency Use Authorization (EUA). This EUA will remain  in effect (meaning this test can be used) for the duration of the COVID-19 declaration under Section 56 4(b)(1) of the Act, 21 U.S.C. section 360bbb-3(b)(1), unless the authorization is terminated or revoked sooner. Performed at [facility], [place] [address], [place], [place] [address]   Culture, blood (routine x 2)     Status: None (Preliminary result)   Collection Time: 08/21/19  6:49 PM   Specimen: BLOOD  Result Value Ref Range Status   Specimen Description BLOOD SITE NOT SPECIFIED  Final   Special Requests   Final    BOTTLES DRAWN AEROBIC AND ANAEROBIC Blood Culture adequate volume   Culture   Final    NO GROWTH 2 DAYS Performed at [facility], [address] [place], [place] [address]    Report Status PENDING  Incomplete         Radiology Studies: CT ABDOMEN PELVIS WO CONTRAST  Result Date: 08/21/2019 CLINICAL DATA:  Early satiety acute renal failure EXAM: CT ABDOMEN AND PELVIS WITHOUT CONTRAST TECHNIQUE: Multidetector CT imaging of the abdomen and pelvis was performed following the standard protocol without IV contrast. COMPARISON:  Ultrasound 08/21/2019, CT 07/30/2019 FINDINGS: Lower chest: Lung bases demonstrate trace left effusion. 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  Cardiac size within normal limits. Mitral calcification. Coronary calcification. Borderline to mild cardiomegaly. Hepatobiliary: Status post cholecystectomy. No focal hepatic abnormality or biliary dilatation Pancreas: Unremarkable. No pancreatic ductal dilatation or surrounding inflammatory changes. Spleen: Normal in size without focal abnormality. Adrenals/Urinary Tract: Adrenal glands are normal. Kidneys show no hydronephrosis. Negative for stone disease. Bladder is normal Stomach/Bowel: Stomach is within normal limits. Appendix not  well seen but no right lower quadrant inflammatory process. Patient has history of prior appendectomy. Large volume of stool in the colon. No evidence of bowel wall thickening, distention, or inflammatory changes. Vascular/Lymphatic: Extensive aortic atherosclerosis without aneurysm. No suspicious adenopathy. Reproductive: Uterus and bilateral adnexa are unremarkable. Other: Negative for free air or free fluid. Generalized subcutaneous edema. Musculoskeletal: Chronic compression deformities at L2, L3 and L4. IMPRESSION: 1. No CT evidence for acute intra-abdominal or pelvic abnormality. Negative for hydronephrosis or urinary tract calculus. 2. 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   On: 08/21/2019 20:29   US Renal  Result Date: 08/21/2019 CLINICAL DATA:  Acute renal failure on chronic kidney disease EXAM: RENAL / URINARY TRACT ULTRASOUND COMPLETE COMPARISON:  CT 09/04/2018 FINDINGS: Right Kidney: Renal measurements: 8 x 5.1 x 4.6 cm = volume: 98.3 mL. Slightly echogenic cortex. No hydronephrosis. Suspected mild cortical thinning. Possible tiny cyst at the upper pole measuring 1 cm. Left Kidney: Renal measurements: 10.3 x 5.2 x 5.7 cm = volume: 159.1 mL. Slightly echogenic cortex. No hydronephrosis or mass. Borderline to mild cortical thinning. Bladder: Limited visualization due to habitus.  Grossly normal.  Other: None. IMPRESSION: 1. Kidneys appear slightly echogenic suggesting medical renal disease. No hydronephrosis. Probable mild cortical thinning/atrophy of both kidneys. Electronically Signed   By: Jasmine Pang M.D.   On: 08/21/2019 18:26   IR Fluoro Guide CV Line Right  Result Date: 08/22/2019 CLINICAL DATA:  Acute kidney injury, needs access for hemodialysis EXAM: EXAM RIGHT IJ CATHETER PLACEMENT UNDER ULTRASOUND AND FLUOROSCOPIC GUIDANCE TECHNIQUE: The procedure, risks (including but not limited to bleeding, infection, organ damage, pneumothorax), benefits, and alternatives were explained to the patient. Questions regarding the procedure were encouraged and answered. The patient understands and consents to the procedure. Patency of the right IJ vein was confirmed with ultrasound with image documentation. An appropriate skin site was determined. Skin site was marked. Region was prepped using maximum barrier technique including cap and mask, sterile gown, sterile gloves, large sterile sheet, and Chlorhexidine as cutaneous antisepsis. The region was infiltrated locally with 1% lidocaine. Under real-time ultrasound guidance, the right IJ vein was accessed with a 21 gauge needle; the needle tip within the vein was confirmed with ultrasound image documentation. The needle exchanged over a 018 guidewire for vascular dilator which allowed advancement of a 16 cm Mahurkar catheter. This was positioned with the tip at the cavoatrial junction. Spot chest radiograph shows good positioning and no pneumothorax. Catheter was flushed and sutured externally with 0-Prolene sutures. Patient tolerated the procedure well. FLUOROSCOPY TIME:  Less than 6 seconds; 3.56 mGy COMPLICATIONS: COMPLICATIONS none IMPRESSION: 1. Technically successful right IJ Mahurkar catheter placement. Electronically Signed   By: Corlis Leak M.D.   On: 08/22/2019 15:51   IR US Guide Vasc Access Right  Result Date: 08/22/2019 CLINICAL DATA:  Acute  kidney injury, needs access for hemodialysis EXAM: EXAM RIGHT IJ CATHETER PLACEMENT UNDER ULTRASOUND AND FLUOROSCOPIC GUIDANCE TECHNIQUE: The procedure, risks (including but not limited to bleeding, infection, organ damage, pneumothorax), benefits, and alternatives were explained to the patient. Questions regarding the procedure were encouraged and answered. The patient understands and consents to the procedure. Patency of the right IJ vein was confirmed with ultrasound with image documentation. An appropriate skin site was determined. Skin site was marked. Region was prepped using maximum barrier technique including cap and mask, sterile gown, sterile gloves, large sterile sheet, and Chlorhexidine as cutaneous antisepsis. The  region was infiltrated locally with 1% lidocaine. Under real-time ultrasound guidance, the right IJ vein was accessed with a 21 gauge needle; the needle tip within the vein was confirmed with ultrasound image documentation. The needle exchanged over a 018 guidewire for vascular dilator which allowed advancement of a 16 cm Mahurkar catheter. This was positioned with the tip at the cavoatrial junction. Spot chest radiograph shows good positioning and no pneumothorax. Catheter was flushed and sutured externally with 0-Prolene sutures. Patient tolerated the procedure well. FLUOROSCOPY TIME:  Less than 6 seconds; 3.56 mGy COMPLICATIONS: COMPLICATIONS none IMPRESSION: 1. Technically successful right IJ Mahurkar catheter placement. Electronically Signed   By: Corlis Leak M.D.   On: 08/22/2019 15:51   DG CHEST PORT 1 VIEW  Result Date: 08/21/2019 CLINICAL DATA:  Missed dialysis. Clinical concern for fluid overload. EXAM: PORTABLE CHEST 1 VIEW COMPARISON:  08/02/2019 FINDINGS: Stable poor inspiration and borderline enlarged cardiac silhouette. Decreased prominence of the pulmonary vasculature. Mild increase in prominence of the interstitial markings, including some Kerley lines. Small right pleural  effusion, decreased. Bilateral shoulder degenerative changes and superior migration of the humeral heads, compatible with large, chronic bilateral rotator cuff tears. IMPRESSION: Mild changes of congestive heart failure with interval mild interstitial pulmonary edema. Electronically Signed   By: Beckie Salts M.D.   On: 08/21/2019 18:59        Scheduled Meds:  aspirin  81 mg Oral Daily   Chlorhexidine Gluconate Cloth  6 each Topical Q0600   gabapentin  100 mg Oral BID   heparin injection (subcutaneous)  5,000 Units Subcutaneous Q8H   levothyroxine  88 mcg Oral QAC breakfast   mouth rinse  15 mL Mouth Rinse BID   oxyCODONE  10 mg Oral Q12H   pantoprazole  40 mg Oral Daily   predniSONE  5 mg Oral BID   Continuous Infusions:  sodium chloride     sodium chloride       LOS: 2 days    Time spent: 30 minutes    Dorcas Carrow, MD [facility] Pager [phone]
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
## 2019-08-23 NOTE — Evaluation (Signed)
Physical Therapy Evaluation Patient Details Name: [name] MRN: [id number] DOB: 07/28/1940 Today's Date: 08/23/2019   History of Present Illness  79 year old female with history of rheumatoid arthritis on chronic steroids, hypertension, coronary artery disease, paroxysmal A. fib on Eliquis, CVA, chronic diastolic congestive heart failure, CKD stage IIIa, Sjogren's syndrome, recurrent pleural effusion, pressure ulcer on the left foot presented to the emergency room with worsening kidney functions confusion from cardiologist office.    Clinical Impression  Pt admitted with above diagnosis. Pt was able to ambulate with min assist +1 for safety with RW in room.  HAs caregivers at home.  Pt currently with functional limitations due to the deficits listed below (see PT Problem List). Pt will benefit from skilled PT to increase their independence and safety with mobility to allow discharge to the venue listed below.      Follow Up Recommendations Home health PT;Supervision/Assistance - 24 hour    Equipment Recommendations  None recommended by PT    Recommendations for Other Services       Precautions / Restrictions Precautions Precautions: Fall Restrictions Weight Bearing Restrictions: No      Mobility  Bed Mobility Overal bed mobility: Needs Assistance Bed Mobility: Supine to Sit     Supine to sit: Max assist;HOB elevated     General bed mobility comments: in chair on arrival.  Transfers Overall transfer level: Needs assistance Equipment used: Rolling walker (2 wheeled) Transfers: Sit to/from [employer] Sit to Stand: Max assist;From elevated surface Stand pivot transfers: Mod assist;Max assist;From elevated surface       General transfer comment: assist to powerup espeically from low recliner, steady, and control descent. some assist to advance rw as well. Pt needed to use 3n1 on arrival therefore stood and pivoted to the 3N1. Pt needed total assist to  be cleaned.  then pt walked from there.   Ambulation/Gait Ambulation/Gait assistance: Min assist;+2 safety/equipment Gait Distance (Feet): 40 Feet Assistive device: Rolling walker (2 wheeled) Gait Pattern/deviations: Step-through pattern;Decreased stride length;Trunk flexed;Antalgic   Gait velocity interpretation: <1.31 ft/sec, indicative of household ambulator General Gait Details: Pt cannot grip walker easily due to RA.  Pt is able to advance RW with some assist to steer. Cues needed to stay close to rW as well.    Stairs            Wheelchair Mobility    Modified Rankin (Stroke Patients Only)       Balance Overall balance assessment: Needs assistance Sitting-balance support: Bilateral upper extremity supported;Feet supported Sitting balance-Leahy Scale: Poor Sitting balance - Comments: sits with BUE support   Standing balance support: Bilateral upper extremity supported Standing balance-Leahy Scale: Poor Standing balance comment: rw and min steadying assist in static stand                             Pertinent Vitals/Pain Pain Assessment: Faces Faces Pain Scale: Hurts even more Pain Location: back, generalized Pain Descriptors / Indicators: Constant;Aching Pain Intervention(s): Limited activity within patient's tolerance;Monitored during session;Repositioned    Home Living Family/patient expects to be discharged to:: Private residence Living Arrangements: Children;Other (Comment)(lives with daughter and has 24/7 HH aide) Available Help at Discharge: Family;Personal care attendant;Available 24 hours/day Type of Home: House Home Access: Stairs to enter   [employer] of Steps: 1 Home Layout: One level Home Equipment: Walker - 2 wheels;Hospital bed      Prior Function Level of Independence: Needs assistance  Gait / Transfers Assistance Needed: rw and min guard A for household ambulation  ADL's / Homemaking Assistance Needed: max A with  UB/LB bathing and dressing. assist with feeding, grooming and bed mobility.         Hand Dominance        Extremity/Trunk Assessment   Upper Extremity Assessment Upper Extremity Assessment: Defer to OT evaluation RUE Deficits / Details: baseline arthritic deformity of joints limiting functional ROM  RUE Coordination: decreased fine motor;decreased gross motor LUE Deficits / Details: baseline arthritic deformity of joints limiting functional ROM LUE Coordination: decreased fine motor;decreased gross motor    Lower Extremity Assessment Lower Extremity Assessment: Generalized weakness    Cervical / Trunk Assessment Cervical / Trunk Assessment: Kyphotic  Communication   Communication: No difficulties  Cognition Arousal/Alertness: Awake/alert Behavior During Therapy: Flat affect Overall Cognitive Status: No family/caregiver present to determine baseline cognitive functioning                                 General Comments: Initially asking where she was. able to state "[name]" when asked later in session. correctly stated month and year.       General Comments General comments (skin integrity, edema, etc.): O2 in place with pt VSS.     Exercises General Exercises - Lower Extremity Ankle Circles/Pumps: AROM;Both;10 reps;Seated Quad Sets: AROM;Both;10 reps;Seated Gluteal Sets: AROM;Both;10 reps;Seated Long Arc Quad: AROM;Both;10 reps;Seated Hip Flexion/Marching: AROM;Both;10 reps;Seated   Assessment/Plan    PT Assessment Patient needs continued PT services  PT Problem List Decreased activity tolerance;Decreased balance;Decreased mobility;Decreased knowledge of use of DME;Decreased safety awareness;Decreased knowledge of precautions;Cardiopulmonary status limiting activity;Decreased strength       PT Treatment Interventions DME instruction;Gait training;Functional mobility training;Therapeutic activities;Therapeutic exercise;Balance  training;Patient/family education    PT Goals (Current goals can be found in the Care Plan section)  Acute Rehab PT Goals Patient Stated Goal: feel better PT Goal Formulation: With patient Time For Goal Achievement: 09/06/19 Potential to Achieve Goals: Good    Frequency Min 3X/week   Barriers to discharge        Co-evaluation               AM-PAC PT "6 Clicks" Mobility  Outcome Measure Help needed turning from your back to your side while in a flat bed without using bedrails?: Total Help needed moving from lying on your back to sitting on the side of a flat bed without using bedrails?: A Lot Help needed moving to and from a bed to a chair (including a wheelchair)?: A Lot Help needed standing up from a chair using your arms (e.g., wheelchair or bedside chair)?: A Lot Help needed to walk in hospital room?: A Lot Help needed climbing 3-5 steps with a railing? : A Lot 6 Click Score: 11    End of Session Equipment Utilized During Treatment: Gait belt;Oxygen Activity Tolerance: Patient limited by fatigue Patient left: in chair;with call bell/phone within reach;with chair alarm set;with family/visitor present Nurse Communication: Mobility status PT Visit Diagnosis: Unsteadiness on feet (R26.81);Muscle weakness (generalized) (M62.81)    Time: 5809-9833 PT Time Calculation (min) (ACUTE ONLY): 23 min   Charges:   PT Evaluation $PT Eval Moderate Complexity: 1 Mod PT Treatments $Gait Training: 8-22 mins        Gissell Barra W,PT Acute Rehabilitation Services Pager:  [phone]  Office:  [place] 08/23/2019, 1:54 PM

## 2019-08-24 LAB — CBC
HCT: 34 % — ABNORMAL LOW (ref 36.0–46.0)
Hemoglobin: 10.9 g/dL — ABNORMAL LOW (ref 12.0–15.0)
MCH: 33.7 pg (ref 26.0–34.0)
MCHC: 32.1 g/dL (ref 30.0–36.0)
MCV: 105.3 fL — ABNORMAL HIGH (ref 80.0–100.0)
Platelets: 171 10*3/uL (ref 150–400)
RBC: 3.23 MIL/uL — ABNORMAL LOW (ref 3.87–5.11)
RDW: 13.6 % (ref 11.5–15.5)
WBC: 9.6 10*3/uL (ref 4.0–10.5)
nRBC: 0.2 % (ref 0.0–0.2)

## 2019-08-24 LAB — RENAL FUNCTION PANEL
Albumin: 2.9 g/dL — ABNORMAL LOW (ref 3.5–5.0)
Anion gap: 11 (ref 5–15)
BUN: 79 mg/dL — ABNORMAL HIGH (ref 8–23)
CO2: 29 mmol/L (ref 22–32)
Calcium: 9.2 mg/dL (ref 8.9–10.3)
Chloride: 95 mmol/L — ABNORMAL LOW (ref 98–111)
Creatinine, Ser: 1.72 mg/dL — ABNORMAL HIGH (ref 0.44–1.00)
GFR calc Af Amer: 33 mL/min — ABNORMAL LOW (ref >60)
GFR calc non Af Amer: 28 mL/min — ABNORMAL LOW (ref >60)
Glucose, Bld: 201 mg/dL — ABNORMAL HIGH (ref 70–99)
Phosphorus: 4.2 mg/dL (ref 2.5–4.6)
Potassium: 4.7 mmol/L (ref 3.5–5.1)
Sodium: 135 mmol/L (ref 135–145)

## 2019-08-24 LAB — GLUCOSE, CAPILLARY
Glucose-Capillary: 178 mg/dL — ABNORMAL HIGH (ref 70–99)
Glucose-Capillary: 186 mg/dL — ABNORMAL HIGH (ref 70–99)
Glucose-Capillary: 211 mg/dL — ABNORMAL HIGH (ref 70–99)
Glucose-Capillary: 212 mg/dL — ABNORMAL HIGH (ref 70–99)
Glucose-Capillary: 320 mg/dL — ABNORMAL HIGH (ref 70–99)
Glucose-Capillary: 346 mg/dL — ABNORMAL HIGH (ref 70–99)

## 2019-08-24 MED ORDER — AMLODIPINE BESYLATE 2.5 MG PO TABS
2.5000 mg | ORAL_TABLET | Freq: Every day | ORAL | Status: DC
  Administered 2019-08-24 – 2019-08-25 (×2): 2.5 mg via ORAL
  Filled 2019-08-24 (×2): qty 1

## 2019-08-24 MED ORDER — INSULIN ASPART 100 UNIT/ML ~~LOC~~ SOLN
0.0000 [IU] | Freq: Every day | SUBCUTANEOUS | Status: DC
  Administered 2019-08-24: 23:00:00 4 [IU] via SUBCUTANEOUS

## 2019-08-24 MED ORDER — TORSEMIDE 20 MG PO TABS
60.0000 mg | ORAL_TABLET | Freq: Every day | ORAL | Status: DC
  Administered 2019-08-24 – 2019-08-25 (×2): 60 mg via ORAL
  Filled 2019-08-24 (×2): qty 3

## 2019-08-24 MED ORDER — TORSEMIDE 20 MG PO TABS: 60.0000 mg | ORAL_TABLET | Freq: Every day | ORAL | Status: DC

## 2019-08-24 MED ORDER — INSULIN ASPART 100 UNIT/ML ~~LOC~~ SOLN
0.0000 [IU] | Freq: Three times a day (TID) | SUBCUTANEOUS | Status: DC
  Administered 2019-08-25: 09:00:00 8 [IU] via SUBCUTANEOUS

## 2019-08-24 MED ORDER — AMLODIPINE BESYLATE 5 MG PO TABS: 5.0000 mg | ORAL_TABLET | Freq: Every day | ORAL | Status: DC

## 2019-08-24 MED ORDER — INSULIN DETEMIR 100 UNIT/ML ~~LOC~~ SOLN
20.0000 [IU] | Freq: Two times a day (BID) | SUBCUTANEOUS | Status: DC
  Administered 2019-08-24 – 2019-08-25 (×2): 20 [IU] via SUBCUTANEOUS
  Filled 2019-08-24 (×3): qty 0.2

## 2019-08-24 NOTE — Progress Notes (Addendum)
[facility] Progress Note    Assessment/ Plan:   Assessment Acute metabolic encephalopathy, improving  Acute renal failure, improving  Creatinine 1.72 this morning and normal AG. UOP 1.1L in last 24 hours.  Will discontinue catheter today.  Restart patient's home torsemide and amlodipine 5mg .  Patient should no longer take valsartan.  From a renal standpoint, patient should be ready for discharge by end of weekend.  Will monitor blood pressures  Plan   Discontinue temp cath   Restart home torsemide  . Continue to follow UOP . Continue to monitor mental status   AM RFP   Subjective:   Patient doing well this morning.  She is not in any pain this morning.  She is asking if she can go home   Objective:   BP (!) 156/66 (BP Location: Left Arm)   Pulse 80   Temp 98.1 F (36.7 C) (Oral)   Resp 19   Ht 4\' 11"  (1.499 m)   Wt 78.1 kg   LMP  (LMP Unknown)   SpO2 100%   BMI 34.78 kg/m  Intake/Output      04/22 0701 - 04/23 0700 04/23 0701 - 04/24 0700   P.O. 240    I.V. (mL/kg)     IV Piggyback     Total Intake(mL/kg) 240 (3.1)    Urine (mL/kg/hr) 1375 (0.7)    Other     Total Output 1375    Net -1135         Urine Occurrence 2 x     Filed Weights   08/22/19 1624 08/23/19 0631 08/24/19 0322  Weight: 80.7 kg 79.4 kg 78.1 kg   Weight change: -2.4 kg  Physical Exam: Gen: alert and oriented x 4, no myoclonus today  08/25/19 irregular Resp:expiratory wheezing throughout.  No respiratory distress, accessory muscle use Abd: +BS, NTTP 08/26/19 1+ edema in lower extremities, trace in proximal upper extremities.  Left pressure ulcer on heel palpated. No skin breakdown appreciated. Area is soft er compared to surrounding tissue.   Imaging: No new in last 24 hours.   Labs: BMET Recent Labs  Lab 08/21/19 1316 08/21/19 1830 08/22/19 0430 08/22/19 1010 08/23/19 1030 08/24/19 0611  NA 131*  --  131* 128* 135 135  K 4.6  --  5.4* 6.3* 4.5 4.7  CL  89*  --  89* 87* 92* 95*  CO2 29  --  29 26 26 29   GLUCOSE 65*  --  143* 174* 160* 201*  BUN 140*  --  138* 146* 72* 79*  CREATININE 2.94*  --  3.01* 3.21* 1.95* 1.72*  CALCIUM 8.5*  --  8.7* 8.3* 9.3 9.2  PHOS  --  5.5*  --   --  4.5 4.2   CBC Recent Labs  Lab 08/21/19 1316 08/21/19 1830 08/22/19 0430 08/23/19 1030 08/24/19 0611  WBC 13.9*  --  11.5* 9.5 9.6  NEUTROABS  --   --   --  8.1*  --   HGB 11.4*  --  11.6* 11.6* 10.9*  HCT 36.2 31.0* 36.1 36.6 34.0*  MCV 105.2*  --  102.6* 104.3* 105.3*  PLT 187  --  173 172 171    Medications:    . aspirin  81 mg Oral Daily  . Chlorhexidine Gluconate Cloth  6 each Topical Q0600  . gabapentin  100 mg Oral BID  . heparin injection (subcutaneous)  5,000 Units Subcutaneous Q8H  . levothyroxine  88 mcg Oral QAC breakfast  .  mouth rinse  15 mL Mouth Rinse BID  . oxyCODONE  10 mg Oral Q12H  . pantoprazole  40 mg Oral Daily  . predniSONE  5 mg Oral BID    Zettie Cooley, MD [place] Resident, PGY2 08/24/2019, 7:58 AM

## 2019-08-24 NOTE — Progress Notes (Signed)
SLP Cancellation Note  Patient Details Name: [name] MRN: [id number] DOB: Nov 22, 1940   Cancelled treatment:       Reason Eval/Treat Not Completed: Other (comment)Initial swallow eval deferred due to procedure. Pt has since been tolerating a regular diet and thin liquids without complaint. Intake is good. No acute need for swallow eval at this time. Will discontinue order. Please reorder if needed.    Briceyda Abdullah, Riley Nearing 08/24/2019, 12:15 PM

## 2019-08-24 NOTE — Progress Notes (Signed)
PROGRESS NOTE    [name]  QBH:[id number] DOB: 12/28/1940 DOA: 08/21/2019 PCP: Buckner Malta, MD    Brief Narrative:  79 year old female with history of rheumatoid arthritis on chronic steroids, hypertension, coronary artery disease, paroxysmal A. fib on Eliquis, CVA, chronic diastolic congestive heart failure, CKD stage IIIa, Sjogren's syndrome, recurrent pleural effusion, pressure ulcer on the left foot presented to the emergency room with worsening kidney functions confusion from cardiologist office.  Patient lives at home with 24-hour care and has recently been not feeling well, extremely tired and not having any urine output for the last 24 hours.  Patient does have chronic problems and brain leg swellings and hand swellings.  She also had recently changed medications including addition of valsartan. In the emergency room, blood pressures were low normal.  Afebrile.  Leukocytosis present.  Sodium 131, BUN 140, creatinine 2.94.  COVID-19 negative.  Renal ultrasound and CT scan of the abdomen pelvis no evidence of obstruction.   Assessment & Plan:   Principal Problem:   Acute worsening of stage 3 chronic kidney disease Active Problems:   Rheumatoid arthritis (HCC)   PAF (paroxysmal atrial fibrillation) (HCC)   Macrocytic anemia   Hyperlipidemia   Hypertension   Hypothyroidism   DM (diabetes mellitus) (HCC)   Chronic diastolic heart failure (HCC)   Hyponatremia   Hypoglycemia   Leukocytosis   Worsening renal function   AKI (acute kidney injury) (HCC)   Advanced care planning/counseling discussion   Goals of care, counseling/discussion  Acute renal failure on history of chronic kidney disease stage IIIa: Acute metabolic encephalopathy Patient presented with uremic symptoms and anuria, hyperkalemia. Renal ultrasound negative for obstruction. Received temporary dialysis catheter, 1 session of hemodialysis on 4/21.  Mentation improved after dialysis. Followed by  nephrology .  They are not anticipating further need for dialysis. Mental status improving.  Urine output improving.  BUN/creatinine improved back to her baseline.  Acute metabolic encephalopathy: Due to above.  No focal deficit.  Mental status improved.  Hypoglycemia and hypothermia: Likely secondary to worsening kidney functions.  Holding long-acting insulin.  On sliding scale.  Hyponatremia: Due to #1.  Will monitor.  Improved.  Paroxysmal A. fib: Rate controlled.  Not on any anticoagulation as mentioned above.  Rheumatoid arthritis/Sjogren's syndrome: On p.o. steroids.  Continue.  Stress dose of steroids given 4/21. Patient also on chronic pain management.  She takes oxycodone both long-acting and short-acting.  Also takes gabapentin.  All home medications resumed.   Ambulate with PT OT.  Continue monitoring in the hospital for urine output and renal function recovery. Probable home tomorrow with home health OT PT.  DVT prophylaxis: SCDs Code Status: Full code Family Communication: Patient's daughter at the bedside. Disposition Plan: Status is: Inpatient  Remains inpatient appropriate because:Persistent severe electrolyte disturbances and Altered mental status   Dispo: The patient is from: Home              Anticipated d/c is to: Home              Anticipated d/c date is: 1 day              Patient currently is not medically stable to d/c. Anticipate home tomorrow if adequate urine output and renal functions are stable.     Consultants:   Nephrology  Procedures:   Right IJ temporary dialysis catheter 4/19  Hemodialysis 4/19  Antimicrobials:   None   Subjective: Patient seen and examined.  Sitting up in chair.  Daughter at the bedside.  Pain still present but controlled after her oxycodone dose in the morning.  Eager to go home.  Objective: Vitals:   08/24/19 0014 08/24/19 0322 08/24/19 0808 08/24/19 1150  BP: (!) 163/59 (!) 156/66 (!) 154/98 (!) 195/73    Pulse: 71 80 74 (!) 48  Resp: 16 19  18   Temp: 98 F (36.7 C) 98.1 F (36.7 C)  99.8 F (37.7 C)  TempSrc: Oral Oral  Oral  SpO2:   94% 96%  Weight:  78.1 kg    Height:        Intake/Output Summary (Last 24 hours) at 08/24/2019 1333 Last data filed at 08/24/2019 0641 Gross per 24 hour  Intake 240 ml  Output 1375 ml  Net -1135 ml   Filed Weights   08/22/19 1624 08/23/19 0631 08/24/19 0322  Weight: 80.7 kg 79.4 kg 78.1 kg    Examination:  General exam: Chronically sick looking, on room air.  Alert and oriented x3. Deformities of the wrist and both legs with advanced rheumatoid arthritis changes.  Anasarca present. Respiratory system: Clear to auscultation. Respiratory effort normal.  No added sounds. Cardiovascular system: S1 & S2 heard, irregularly irregular.. Gastrointestinal system: Abdomen is nondistended, soft and nontender.  Central nervous system: Alert and oriented x 3.  No focal neurological deficits. psychiatry: Judgement and insight appear normal. Mood & affect flat.    Data Reviewed: I have personally reviewed following labs and imaging studies  CBC: Recent Labs  Lab 08/21/19 1316 08/21/19 1830 08/22/19 0430 08/23/19 1030 08/24/19 0611  WBC 13.9*  --  11.5* 9.5 9.6  NEUTROABS  --   --   --  8.1*  --   HGB 11.4*  --  11.6* 11.6* 10.9*  HCT 36.2 31.0* 36.1 36.6 34.0*  MCV 105.2*  --  102.6* 104.3* 105.3*  PLT 187  --  173 172 171   Basic Metabolic Panel: Recent Labs  Lab 08/21/19 1316 08/21/19 1830 08/22/19 0430 08/22/19 1010 08/23/19 1030 08/24/19 0611  NA 131*  --  131* 128* 135 135  K 4.6  --  5.4* 6.3* 4.5 4.7  CL 89*  --  89* 87* 92* 95*  CO2 29  --  29 26 26 29   GLUCOSE 65*  --  143* 174* 160* 201*  BUN 140*  --  138* 146* 72* 79*  CREATININE 2.94*  --  3.01* 3.21* 1.95* 1.72*  CALCIUM 8.5*  --  8.7* 8.3* 9.3 9.2  MG  --  3.9*  --   --  3.4*  --   PHOS  --  5.5*  --   --  4.5 4.2   GFR: Estimated Creatinine Clearance: 24.7 mL/min  (A) (by C-G formula based on SCr of 1.72 mg/dL (H)). Liver Function Tests: Recent Labs  Lab 08/21/19 1316 08/22/19 0430 08/23/19 1030 08/24/19 0611  AST 27 25 32  --   ALT 29 32 31  --   ALKPHOS 99 103 148*  --   BILITOT 0.6 0.8 1.4*  --   PROT 5.7* 5.9* 6.6  --   ALBUMIN 3.0* 2.8* 3.4* 2.9*   Recent Labs  Lab 08/21/19 1316  LIPASE 19   No results for input(s): AMMONIA in the last 168 hours. Coagulation Profile: No results for input(s): INR, PROTIME in the last 168 hours. Cardiac Enzymes: No results for input(s): CKTOTAL, CKMB, CKMBINDEX, TROPONINI in the last 168 hours. BNP (last 3 results) No results for input(s): PROBNP in the  last 8760 hours. HbA1C: No results for input(s): HGBA1C in the last 72 hours. CBG: Recent Labs  Lab 08/23/19 2003 08/24/19 0008 08/24/19 0355 08/24/19 0803 08/24/19 1138  GLUCAP 211* 178* 186* 212* 211*   Lipid Profile: No results for input(s): CHOL, HDL, LDLCALC, TRIG, CHOLHDL, LDLDIRECT in the last 72 hours. Thyroid Function Tests: Recent Labs    08/21/19 1830  TSH 3.331   Anemia Panel: Recent Labs    08/21/19 1830  VITAMINB12 1,078*   Sepsis Labs: Recent Labs  Lab 08/21/19 1844  LATICACIDVEN 1.2    Recent Results (from the past 240 hour(s))  SARS CORONAVIRUS 2 (TAT 6-24 HRS) Nasopharyngeal Nasopharyngeal Swab     Status: None   Collection Time: 08/21/19  6:42 PM   Specimen: Nasopharyngeal Swab  Result Value Ref Range Status   SARS Coronavirus 2 NEGATIVE NEGATIVE Final    Comment: (NOTE) SARS-CoV-2 target nucleic acids are NOT DETECTED. The SARS-CoV-2 RNA is generally detectable in upper and lower respiratory specimens during the acute phase of infection. Negative results do not preclude SARS-CoV-2 infection, do not rule out co-infections with other pathogens, and should not be used as the sole basis for treatment or other patient management decisions. Negative results must be combined with clinical  observations, patient history, and epidemiological information. The expected result is Negative. Fact Sheet for Patients: SugarRoll.be Fact Sheet for Healthcare Providers: https://www.woods-mathews.com/ This test is not yet approved or cleared by the [place] FDA and  has been authorized for detection and/or diagnosis of SARS-CoV-2 by FDA under an Emergency Use Authorization (EUA). This EUA will remain  in effect (meaning this test can be used) for the duration of the COVID-19 declaration under Section 56 4(b)(1) of the Act, 21 U.S.C. section 360bbb-3(b)(1), unless the authorization is terminated or revoked sooner. Performed at [facility], [place] [address], [place], [place] [address]   Culture, blood (routine x 2)     Status: None (Preliminary result)   Collection Time: 08/21/19  6:49 PM   Specimen: BLOOD  Result Value Ref Range Status   Specimen Description BLOOD SITE NOT SPECIFIED  Final   Special Requests   Final    BOTTLES DRAWN AEROBIC AND ANAEROBIC Blood Culture adequate volume   Culture   Final    NO GROWTH 2 DAYS Performed at [facility], [address] [place], [place] [address]    Report Status PENDING  Incomplete         Radiology Studies: No results found.      Scheduled Meds:  amLODipine  2.5 mg Oral Daily   aspirin  81 mg Oral Daily   Chlorhexidine Gluconate Cloth  6 each Topical Q0600   gabapentin  100 mg Oral BID   heparin injection (subcutaneous)  5,000 Units Subcutaneous Q8H   levothyroxine  88 mcg Oral QAC breakfast   mouth rinse  15 mL Mouth Rinse BID   oxyCODONE  10 mg Oral Q12H   pantoprazole  40 mg Oral Daily   predniSONE  5 mg Oral BID   torsemide  60 mg Oral Daily   Continuous Infusions:  sodium chloride     sodium chloride       LOS: 3 days    Time spent: 30 minutes    Barb Merino, MD [facility] Pager [phone]

## 2019-08-24 NOTE — Care Management Important Message (Signed)
Important Message  Patient Details  Name: [name] MRN: [id number] Date of Birth: August 17, 1940   Medicare Important Message Given:  Yes     [name] 08/24/2019, 8:41 AM

## 2019-08-24 NOTE — Progress Notes (Signed)
Spoke with Counselling psychologist and she is aware of the DC central line order

## 2019-08-24 NOTE — Progress Notes (Signed)
Physical Therapy Treatment Patient Details Name: [name] MRN: [id number] DOB: [birth year] Today's Date: 08/24/2019    History of Present Illness 79 year old female with history of rheumatoid arthritis on chronic steroids, hypertension, coronary artery disease, paroxysmal A. fib on Eliquis, CVA, chronic diastolic congestive heart failure, CKD stage IIIa, Sjogren's syndrome, recurrent pleural effusion, pressure ulcer on the left foot presented to the emergency room with worsening kidney functions confusion from cardiologist office.      PT Comments    Pt admitted with above diagnosis. Pt was able to ambulate with min assist and cues with RW and incr distance in room.  Pt close to baseline per daughter. Issued gait belt to pt.   Pt currently with functional limitations due to the deficits listed below (see PT Problem List). Pt will benefit from skilled PT to increase their independence and safety with mobility to allow discharge to the venue listed below.     Follow Up Recommendations  Home health PT;Supervision/Assistance - 24 hour     Equipment Recommendations  None recommended by PT    Recommendations for Other Services       Precautions / Restrictions Precautions Precautions: Fall Restrictions Weight Bearing Restrictions: No    Mobility  Bed Mobility Overal bed mobility: Needs Assistance Bed Mobility: Supine to Sit     Supine to sit: HOB elevated;Mod assist     General bed mobility comments: Raised HOB as daughter states they do this at home.  Assit for elevation of trunk.   Transfers Overall transfer level: Needs assistance Equipment used: Rolling walker (2 wheeled) Transfers: Sit to/from Stand Sit to Stand: From elevated surface;Mod assist         General transfer comment: assist to powerup, steady, and control descent. some assist to advance rw as well.   Ambulation/Gait Ambulation/Gait assistance: Min assist Gait Distance (Feet): 50 Feet Assistive  device: Rolling walker (2 wheeled) Gait Pattern/deviations: Step-through pattern;Decreased stride length;Trunk flexed;Antalgic   Gait velocity interpretation: <1.31 ft/sec, indicative of household ambulator General Gait Details: Pt cannot grip walker easily due to RA.  Pt is able to advance RW with some assist to steer. Cues needed to stay close to rW as well.  Pt unaware that she was havign BM on walking back to chair but PT noted and brought 3N1 to pt. Pt finished using the bathroom and PT cleaned with total assist. Noted pt with dripping nose therefore asked nurse if PT could get humidifier for O2 and nurse agreed.    Stairs             Wheelchair Mobility    Modified Rankin (Stroke Patients Only)       Balance Overall balance assessment: Needs assistance Sitting-balance support: Bilateral upper extremity supported;Feet supported Sitting balance-Leahy Scale: Poor Sitting balance - Comments: sits with BUE support   Standing balance support: Bilateral upper extremity supported Standing balance-Leahy Scale: Poor Standing balance comment: rw and min steadying assist in static stand                            Cognition Arousal/Alertness: Awake/alert Behavior During Therapy: Flat affect Overall Cognitive Status: Within Functional Limits for tasks assessed                                        Exercises General Exercises - Lower Extremity Ankle Circles/Pumps: AROM;Both;10 reps;Seated  Quad Sets: AROM;Both;10 reps;Seated Gluteal Sets: AROM;Both;10 reps;Seated Long Arc Quad: AROM;Both;10 reps;Seated Hip Flexion/Marching: AROM;Both;10 reps;Seated    General Comments General comments (skin integrity, edema, etc.): O2 in place with VSS. When removed she desats below 88%.       Pertinent Vitals/Pain Pain Assessment: Faces Faces Pain Scale: Hurts even more Pain Location: back, generalized Pain Descriptors / Indicators: Constant;Aching Pain  Intervention(s): Limited activity within patient's tolerance;Monitored during session;Repositioned    Home Living                      Prior Function            PT Goals (current goals can now be found in the care plan section) Acute Rehab PT Goals Patient Stated Goal: feel better Progress towards PT goals: Progressing toward goals    Frequency    Min 3X/week      PT Plan Current plan remains appropriate    Co-evaluation              AM-PAC PT "6 Clicks" Mobility   Outcome Measure  Help needed turning from your back to your side while in a flat bed without using bedrails?: A Lot Help needed moving from lying on your back to sitting on the side of a flat bed without using bedrails?: A Lot Help needed moving to and from a bed to a chair (including a wheelchair)?: A Lot Help needed standing up from a chair using your arms (e.g., wheelchair or bedside chair)?: A Little Help needed to walk in hospital room?: A Little Help needed climbing 3-5 steps with a railing? : A Lot 6 Click Score: 14    End of Session Equipment Utilized During Treatment: Gait belt;Oxygen Activity Tolerance: Patient limited by fatigue Patient left: in chair;with call bell/phone within reach;with chair alarm set;with family/visitor present Nurse Communication: Mobility status PT Visit Diagnosis: Unsteadiness on feet (R26.81);Muscle weakness (generalized) (M62.81)     Time: 2297-9892 PT Time Calculation (min) (ACUTE ONLY): 35 min  Charges:  $Gait Training: 8-22 mins $Therapeutic Exercise: 8-22 mins                     Darcey Cardy W,PT Acute Rehabilitation Services Pager:  [phone]  Office:  [place] 08/24/2019, 12:42 PM

## 2019-08-25 LAB — RENAL FUNCTION PANEL
Albumin: 2.8 g/dL — ABNORMAL LOW (ref 3.5–5.0)
Anion gap: 11 (ref 5–15)
BUN: 85 mg/dL — ABNORMAL HIGH (ref 8–23)
CO2: 32 mmol/L (ref 22–32)
Calcium: 9.3 mg/dL (ref 8.9–10.3)
Chloride: 94 mmol/L — ABNORMAL LOW (ref 98–111)
Creatinine, Ser: 1.69 mg/dL — ABNORMAL HIGH (ref 0.44–1.00)
GFR calc Af Amer: 33 mL/min — ABNORMAL LOW (ref >60)
GFR calc non Af Amer: 29 mL/min — ABNORMAL LOW (ref >60)
Glucose, Bld: 298 mg/dL — ABNORMAL HIGH (ref 70–99)
Phosphorus: 3.5 mg/dL (ref 2.5–4.6)
Potassium: 4.6 mmol/L (ref 3.5–5.1)
Sodium: 137 mmol/L (ref 135–145)

## 2019-08-25 LAB — GLUCOSE, CAPILLARY
Glucose-Capillary: 258 mg/dL — ABNORMAL HIGH (ref 70–99)
Glucose-Capillary: 273 mg/dL — ABNORMAL HIGH (ref 70–99)
Glucose-Capillary: 290 mg/dL — ABNORMAL HIGH (ref 70–99)
Glucose-Capillary: 330 mg/dL — ABNORMAL HIGH (ref 70–99)

## 2019-08-25 MED ORDER — OMEPRAZOLE MAGNESIUM 20 MG PO TBEC
20.0000 mg | DELAYED_RELEASE_TABLET | Freq: Every day | ORAL | 11 refills | Status: AC
Start: 2019-08-25 — End: 2020-08-24

## 2019-08-25 MED ORDER — AMLODIPINE BESYLATE 5 MG PO TABS: 5.0000 mg | ORAL_TABLET | Freq: Every day | ORAL | 0 refills | Status: DC

## 2019-08-25 NOTE — Discharge Summary (Signed)
Physician Discharge Summary  [name] ZOX:[id number] DOB: [birth year] DOA: 08/21/2019  PCP: Buckner Malta, MD  Admit date: 08/21/2019 Discharge date: 08/25/2019  Admitted From: Home Disposition: Home  Recommendations for Outpatient Follow-up:  1. Follow up with PCP in 1-2 weeks 2. Follow-up with nephrology as scheduled.  Home Health: PT/OT/RN Equipment/Devices: Device is available at home  Discharge Condition: Fair CODE STATUS: Full code Diet recommendation: Low-salt and low-carb diet  Discharge summary: 79 year old female with history of rheumatoid arthritis on chronic steroids, hypertension, coronary artery disease, paroxysmal A. fib taken off Eliquis, CVA, chronic diastolic congestive heart failure, CKD stage IIIa, Sjogren's syndrome, recurrent pleural effusion, pressure ulcer on the left foot presented to the emergency room with worsening kidney functions, confusion from cardiologist office.  Patient lives at home with 24-hour care and has recently been not feeling well, extremely tired and not having any urine output for the last 24 hours.  Patient does have chronic problems and leg swellings and hand swellings.  She also had recently changed medications including addition of valsartan.  In the emergency room, blood pressures were low normal.  Afebrile.  Leukocytosis present.  Sodium 131, BUN 140, creatinine 2.94.  COVID-19 negative.  Renal ultrasound and CT scan of the abdomen pelvis no evidence of obstruction. She also complaint of blurry vision.  Acute renal failure with history of chronic kidney disease stage IIIa with acute metabolic encephalopathy, acute uremic symptoms and hyperkalemia: Negative for obstruction. Continue to have confusion and altered mentation, underwent emergent dialysis one session with improvement of renal functions, mentation and normal urine output. This was thought to be aggravated by use of ARB with her underlying chronic kidney  disease. Currently stabilized, blood pressures are started improving. We will resume amlodipine, resume clonidine.  Hydralazine not needed, that discontinued.  ARB discontinued.  She will have outpatient follow-up with nephrology.  Hypoglycemia and hypothermia: With worsening kidney functions and use of insulin.  With improvement of clinical status, her blood sugars improved and elevated.  She will go back on her insulin regimen at home.  Rest of the chronic medical issues remained stable.  She is on chronic opiates for pain relief that she will continue.  Discharge Diagnoses:  Principal Problem:   Acute worsening of stage 3 chronic kidney disease Active Problems:   Rheumatoid arthritis (HCC)   PAF (paroxysmal atrial fibrillation) (HCC)   Macrocytic anemia   Hyperlipidemia   Hypertension   Hypothyroidism   DM (diabetes mellitus) (HCC)   Chronic diastolic heart failure (HCC)   Hyponatremia   Hypoglycemia   Leukocytosis   Worsening renal function   AKI (acute kidney injury) (HCC)   Advanced care planning/counseling discussion   Goals of care, counseling/discussion    Discharge Instructions  Discharge Instructions    Call MD for:  extreme fatigue   Complete by: As directed    Call MD for:  persistant dizziness or light-headedness   Complete by: As directed    Diet - low sodium heart healthy   Complete by: As directed    Diet Carb Modified   Complete by: As directed    Increase activity slowly   Complete by: As directed      Allergies as of 08/25/2019      Reactions   Gold Anaphylaxis   Swelling of lips/tongue, throat Swelling of lips/tongue, throat   Nsaids Other (See Comments)   Intolerance Intolerance   Sulfa Antibiotics Nausea Only   Sulfonamide Derivatives    Latex Other (See Comments)  blisters   Other Nausea Only   Sulfamethoxazole Nausea Only      Medication List    STOP taking these medications   hydrALAZINE 50 MG tablet Commonly known as:  APRESOLINE   lansoprazole 30 MG capsule Commonly known as: PREVACID   mupirocin ointment 2 % Commonly known as: Bactroban   spironolactone 25 MG tablet Commonly known as: ALDACTONE     TAKE these medications   amLODipine 5 MG tablet Commonly known as: NORVASC Take 1 tablet (5 mg total) by mouth daily. Start taking on: August 26, 2019   aspirin 81 MG chewable tablet Chew 81 mg by mouth daily.   B-D ULTRAFINE III SHORT PEN 31G X 8 MM Misc Generic drug: Insulin Pen Needle   cloNIDine 0.1 MG tablet Commonly known as: CATAPRES Take 0.1 mg by mouth Three times a day.   diazepam 2 MG tablet Commonly known as: VALIUM Take 2 mg by mouth daily as needed.   FREESTYLE LITE test strip Generic drug: glucose blood   gabapentin 100 MG capsule Commonly known as: NEURONTIN Take 200 mg by mouth 2 (two) times daily.   insulin NPH-regular Human (70-30) 100 UNIT/ML injection Inject 30-40 Units into the skin in the morning and at bedtime. Inject 40 units into the skin each morning and 30 units into the skin each evening.   levothyroxine 88 MCG tablet Commonly known as: SYNTHROID Take 88 mcg by mouth daily before breakfast. What changed: Another medication with the same name was removed. Continue taking this medication, and follow the directions you see here.   lidocaine 5 % Commonly known as: LIDODERM Place 1 patch onto the skin daily.   Linzess 145 MCG Caps capsule Generic drug: linaclotide Take 145 mcg by mouth daily.   multivitamin tablet Take 1 tablet by mouth daily.   omeprazole 20 MG tablet Commonly known as: PriLOSEC OTC Take 1 tablet (20 mg total) by mouth daily.   oxyCODONE 10 mg 12 hr tablet Commonly known as: OXYCONTIN Take 10 mg by mouth every 12 (twelve) hours.   oxyCODONE 5 MG immediate release tablet Commonly known as: Oxy IR/ROXICODONE Take 5 mg by mouth 3 (three) times daily as needed for severe pain.   polyethylene glycol powder 17 GM/SCOOP  powder Commonly known as: GLYCOLAX/MIRALAX Take 17 g by mouth as needed for mild constipation.   predniSONE 5 MG tablet Commonly known as: DELTASONE Take 5 mg by mouth 2 (two) times daily.   Restasis 0.05 % ophthalmic emulsion Generic drug: cycloSPORINE Place 1 drop into both eyes 2 (two) times daily.   SYSTANE BALANCE OP Place 1 drop into both eyes as needed (for dry eyes).   tiZANidine 2 MG tablet Commonly known as: ZANAFLEX Take 2 mg by mouth 2 (two) times daily as needed for muscle spasms.   torsemide 20 MG tablet Commonly known as: DEMADEX Take 3 tablets (60 mg total) by mouth daily.   Vitamin D3 50 MCG (2000 UT) Tabs Take 2,000 Units by mouth daily.      Follow-up Information    Arita Miss, MD Follow up on 09/04/2019.   Specialty: Nephrology Why: 10am; you will be seeing Rogers Blocker PA Contact information: [address] [place] [address] [phone]          Allergies  Allergen Reactions  . Gold Anaphylaxis    Swelling of lips/tongue, throat Swelling of lips/tongue, throat  . Nsaids Other (See Comments)    Intolerance Intolerance  . Sulfa Antibiotics Nausea  Only  . Sulfonamide Derivatives   . Latex Other (See Comments)    blisters  . Other Nausea Only  . Sulfamethoxazole Nausea Only    Consultations:  Nephrology  Palliative medicine   Procedures/Studies: CT ABDOMEN PELVIS WO CONTRAST  Result Date: 08/21/2019 CLINICAL DATA:  Early satiety acute renal failure EXAM: CT ABDOMEN AND PELVIS WITHOUT CONTRAST TECHNIQUE: Multidetector CT imaging of the abdomen and pelvis was performed following the standard protocol without IV contrast. COMPARISON:  Ultrasound 08/21/2019, CT 07/30/2019 FINDINGS: Lower chest: Lung bases demonstrate trace left effusion. 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 Cardiac size within normal limits. Mitral calcification. Coronary  calcification. Borderline to mild cardiomegaly. Hepatobiliary: Status post cholecystectomy. No focal hepatic abnormality or biliary dilatation Pancreas: Unremarkable. No pancreatic ductal dilatation or surrounding inflammatory changes. Spleen: Normal in size without focal abnormality. Adrenals/Urinary Tract: Adrenal glands are normal. Kidneys show no hydronephrosis. Negative for stone disease. Bladder is normal Stomach/Bowel: Stomach is within normal limits. Appendix not well seen but no right lower quadrant inflammatory process. Patient has history of prior appendectomy. Large volume of stool in the colon. No evidence of bowel wall thickening, distention, or inflammatory changes. Vascular/Lymphatic: Extensive aortic atherosclerosis without aneurysm. No suspicious adenopathy. Reproductive: Uterus and bilateral adnexa are unremarkable. Other: Negative for free air or free fluid. Generalized subcutaneous edema. Musculoskeletal: Chronic compression deformities at L2, L3 and L4. IMPRESSION: 1. No CT evidence for acute intra-abdominal or pelvic abnormality. Negative for hydronephrosis or urinary tract calculus. 2. 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   On: 08/21/2019 20:29   US Renal  Result Date: 08/21/2019 CLINICAL DATA:  Acute renal failure on chronic kidney disease EXAM: RENAL / URINARY TRACT ULTRASOUND COMPLETE COMPARISON:  CT 09/04/2018 FINDINGS: Right Kidney: Renal measurements: 8 x 5.1 x 4.6 cm = volume: 98.3 mL. Slightly echogenic cortex. No hydronephrosis. Suspected mild cortical thinning. Possible tiny cyst at the upper pole measuring 1 cm. Left Kidney: Renal measurements: 10.3 x 5.2 x 5.7 cm = volume: 159.1 mL. Slightly echogenic cortex. No hydronephrosis or mass. Borderline to mild cortical thinning. Bladder: Limited visualization due to habitus.  Grossly normal. Other: None. IMPRESSION: 1. Kidneys appear slightly echogenic  suggesting medical renal disease. No hydronephrosis. Probable mild cortical thinning/atrophy of both kidneys. Electronically Signed   By: Jasmine Pang M.D.   On: 08/21/2019 18:26   IR Fluoro Guide CV Line Right  Result Date: 08/22/2019 CLINICAL DATA:  Acute kidney injury, needs access for hemodialysis EXAM: EXAM RIGHT IJ CATHETER PLACEMENT UNDER ULTRASOUND AND FLUOROSCOPIC GUIDANCE TECHNIQUE: The procedure, risks (including but not limited to bleeding, infection, organ damage, pneumothorax), benefits, and alternatives were explained to the patient. Questions regarding the procedure were encouraged and answered. The patient understands and consents to the procedure. Patency of the right IJ vein was confirmed with ultrasound with image documentation. An appropriate skin site was determined. Skin site was marked. Region was prepped using maximum barrier technique including cap and mask, sterile gown, sterile gloves, large sterile sheet, and Chlorhexidine as cutaneous antisepsis. The region was infiltrated locally with 1% lidocaine. Under real-time ultrasound guidance, the right IJ vein was accessed with a 21 gauge needle; the needle tip within the vein was confirmed with ultrasound image documentation. The needle exchanged over a 018 guidewire for vascular dilator which allowed advancement of a 16 cm Mahurkar catheter. This was positioned with the tip at the  cavoatrial junction. Spot chest radiograph shows good positioning and no pneumothorax. Catheter was flushed and sutured externally with 0-Prolene sutures. Patient tolerated the procedure well. FLUOROSCOPY TIME:  Less than 6 seconds; 3.56 mGy COMPLICATIONS: COMPLICATIONS none IMPRESSION: 1. Technically successful right IJ Mahurkar catheter placement. Electronically Signed   By: Corlis Leak M.D.   On: 08/22/2019 15:51   IR US Guide Vasc Access Right  Result Date: 08/22/2019 CLINICAL DATA:  Acute kidney injury, needs access for hemodialysis EXAM: EXAM RIGHT IJ  CATHETER PLACEMENT UNDER ULTRASOUND AND FLUOROSCOPIC GUIDANCE TECHNIQUE: The procedure, risks (including but not limited to bleeding, infection, organ damage, pneumothorax), benefits, and alternatives were explained to the patient. Questions regarding the procedure were encouraged and answered. The patient understands and consents to the procedure. Patency of the right IJ vein was confirmed with ultrasound with image documentation. An appropriate skin site was determined. Skin site was marked. Region was prepped using maximum barrier technique including cap and mask, sterile gown, sterile gloves, large sterile sheet, and Chlorhexidine as cutaneous antisepsis. The region was infiltrated locally with 1% lidocaine. Under real-time ultrasound guidance, the right IJ vein was accessed with a 21 gauge needle; the needle tip within the vein was confirmed with ultrasound image documentation. The needle exchanged over a 018 guidewire for vascular dilator which allowed advancement of a 16 cm Mahurkar catheter. This was positioned with the tip at the cavoatrial junction. Spot chest radiograph shows good positioning and no pneumothorax. Catheter was flushed and sutured externally with 0-Prolene sutures. Patient tolerated the procedure well. FLUOROSCOPY TIME:  Less than 6 seconds; 3.56 mGy COMPLICATIONS: COMPLICATIONS none IMPRESSION: 1. Technically successful right IJ Mahurkar catheter placement. Electronically Signed   By: Corlis Leak M.D.   On: 08/22/2019 15:51   DG CHEST PORT 1 VIEW  Result Date: 08/21/2019 CLINICAL DATA:  Missed dialysis. Clinical concern for fluid overload. EXAM: PORTABLE CHEST 1 VIEW COMPARISON:  08/02/2019 FINDINGS: Stable poor inspiration and borderline enlarged cardiac silhouette. Decreased prominence of the pulmonary vasculature. Mild increase in prominence of the interstitial markings, including some Kerley lines. Small right pleural effusion, decreased. Bilateral shoulder degenerative changes and  superior migration of the humeral heads, compatible with large, chronic bilateral rotator cuff tears. IMPRESSION: Mild changes of congestive heart failure with interval mild interstitial pulmonary edema. Electronically Signed   By: Beckie Salts M.D.   On: 08/21/2019 18:59   VAS US RENAL ARTERY DUPLEX  Result Date: 08/21/2019 ABDOMINAL VISCERAL Indications: Uncontrolled hypertension. Patient indicates she is not urinating              very much these last 2 days. She is having upper abdominal pain and              feels very weak. She is having swelling throughout her entire body. High Risk Factors: Hypertension, Diabetes, no history of smoking, coronary                    artery disease. Other Factors: CKD stage 3                 Most recent blood work 07/24/2019                BUN 98                Creatinine 1.86. Limitations: Air/bowel gas, obesity and patient discomfort. Comparison Study: None Performing Technologist: Alecia Mackin RVT, RDCS (AE), RDMS  Examination Guidelines: A complete evaluation includes B-mode imaging, spectral Doppler,  color Doppler, and power Doppler as needed of all accessible portions of each vessel. Bilateral testing is considered an integral part of a complete examination. Limited examinations for reoccurring indications may be performed as noted.  Duplex Findings: +----------------------+--------+--------+--------+--------+ Mesenteric            PSV cm/sEDV cm/s Plaque Comments +----------------------+--------+--------+--------+--------+ Aorta Prox               74           calcific         +----------------------+--------+--------+--------+--------+ Aorta Distal            100           calcific         +----------------------+--------+--------+--------+--------+ Celiac Artery Origin    114      15                    +----------------------+--------+--------+--------+--------+ Celiac Artery Proximal  139      10                     +----------------------+--------+--------+--------+--------+ SMA Proximal            209      26                    +----------------------+--------+--------+--------+--------+  +------------------+--------+--------+-------------------------------+ Right Renal ArteryPSV cm/sEDV cm/s            Comment             +------------------+--------+--------+-------------------------------+ Origin                            not seen due to overlying bowel +------------------+--------+--------+-------------------------------+ Proximal                                     not seen             +------------------+--------+--------+-------------------------------+ Mid                 161      14                                   +------------------+--------+--------+-------------------------------+ Distal               48      1                                    +------------------+--------+--------+-------------------------------+ +-----------------+--------+--------+-------+ Left Renal ArteryPSV cm/sEDV cm/sComment +-----------------+--------+--------+-------+ Origin              97      19           +-----------------+--------+--------+-------+ Proximal            88      21           +-----------------+--------+--------+-------+ Mid                 80      14           +-----------------+--------+--------+-------+ Distal              57      11           +-----------------+--------+--------+-------+  Technologist observations: Technically limited exam due to overlying bowel. +------------+--------+--------+----+-----------+--------+--------+----+ Right KidneyPSV cm/sEDV cm/sRI  Left KidneyPSV cm/sEDV cm/sRI   +------------+--------+--------+----+-----------+--------+--------+----+ Upper Pole  29      0       1.00Upper Pole 19      0       0.98 +------------+--------+--------+----+-----------+--------+--------+----+ Mid         18      0        1.00Mid        19      1       0.97 +------------+--------+--------+----+-----------+--------+--------+----+ Lower Pole  22      0       0.99Lower Pole 16      0       0.99 +------------+--------+--------+----+-----------+--------+--------+----+ Hilar       36      0       0.99Hilar      26      0       1.00 +------------+--------+--------+----+-----------+--------+--------+----+ +------------------+--------+------------------+--------+ Right Kidney              Left Kidney                +------------------+--------+------------------+--------+ RAR                       RAR                        +------------------+--------+------------------+--------+ RAR (manual)      2.1     RAR (manual)      1.3      +------------------+--------+------------------+--------+ Cortex                    Cortex                     +------------------+--------+------------------+--------+ Cortex thickness  12.00 mmCorex thickness   11.00 mm +------------------+--------+------------------+--------+ Kidney length (cm)9.40    Kidney length (cm)10.60    +------------------+--------+------------------+--------+  Findings reported to Dr. Oval Linsey at 12:00 pm . Summary: Largest Aortic Diameter: 2.2 cm  Renal:  Right: Normal size right kidney. Abnormal right Resistive Index.        Normal cortical thickness of right kidney. RRV flow present.        From what was seen there is no evidence of renal artery        stenosis. Left:  Normal size of left kidney. Abnormal left Resisitve Index.        Normal cortical thickness of the left kidney. LRV flow        present. No evidence of left renal artery stenosis. Mesenteric: Normal Celiac artery and Superior Mesenteric artery findings. Areas of limited visceral study include right renal artery. Moderate aortic wall calcification. There is no evidence of diastolic flow seen in the segtmental arterial dopplers in both kidneys.  *See table(s) above  for measurements and observations.  Diagnosing physician: Ida Rogue MD  Electronically signed by Ida Rogue MD on 08/21/2019 at 6:02:21 PM.    Final     Subjective: Patient was seen and examined.  No overnight events.  Daughter at the bedside.  She is eager to go home.  She was 89 to 92% on room air.  There were wondering whether she needs home oxygen, however given more than 89% saturation, will not use home oxygen.   Discharge Exam: Vitals:   08/25/19 0858 08/25/19 1118  BP: (!) 190/87 Marland Kitchen)  179/70  Pulse: 87 90  Resp:    Temp:  98.1 F (36.7 C)  SpO2: 96% (!) 89%   Vitals:   08/25/19 0746 08/25/19 0834 08/25/19 0858 08/25/19 1118  BP: (!) 150/89  (!) 190/87 (!) 179/70  Pulse: 76 97 87 90  Resp:      Temp: 98 F (36.7 C)   98.1 F (36.7 C)  TempSrc: Oral   Oral  SpO2: 94% 94% 96% (!) 89%  Weight:      Height:        General: Pt is alert, awake, not in acute distress, sitting in chair.  On room air.  Chronically sick looking but not in any distress. Cardiovascular: Irregularly irregular, S1/S2 +, no rubs, no gallops Respiratory: CTA bilaterally, no wheezing, no rhonchi Abdominal: Soft, NT, ND, bowel sounds + Extremities: She has some anasarca, chronic deformities of the both hands due to rheumatoid arthritis.    The results of significant diagnostics from this hospitalization (including imaging, microbiology, ancillary and laboratory) are listed below for reference.     Microbiology: Recent Results (from the past 240 hour(s))  SARS CORONAVIRUS 2 (TAT 6-24 HRS) Nasopharyngeal Nasopharyngeal Swab     Status: None   Collection Time: 08/21/19  6:42 PM   Specimen: Nasopharyngeal Swab  Result Value Ref Range Status   SARS Coronavirus 2 NEGATIVE NEGATIVE Final    Comment: (NOTE) SARS-CoV-2 target nucleic acids are NOT DETECTED. The SARS-CoV-2 RNA is generally detectable in upper and lower respiratory specimens during the acute phase of infection. Negative results  do not preclude SARS-CoV-2 infection, do not rule out co-infections with other pathogens, and should not be used as the sole basis for treatment or other patient management decisions. Negative results must be combined with clinical observations, patient history, and epidemiological information. The expected result is Negative. Fact Sheet for Patients: HairSlick.no Fact Sheet for Healthcare Providers: quierodirigir.com This test is not yet approved or cleared by the [place] FDA and  has been authorized for detection and/or diagnosis of SARS-CoV-2 by FDA under an Emergency Use Authorization (EUA). This EUA will remain  in effect (meaning this test can be used) for the duration of the COVID-19 declaration under Section 56 4(b)(1) of the Act, 21 U.S.C. section 360bbb-3(b)(1), unless the authorization is terminated or revoked sooner. Performed at [facility], [address] [place], [place] [address]   Culture, blood (routine x 2)     Status: None (Preliminary result)   Collection Time: 08/21/19  6:49 PM   Specimen: BLOOD  Result Value Ref Range Status   Specimen Description BLOOD SITE NOT SPECIFIED  Final   Special Requests   Final    BOTTLES DRAWN AEROBIC AND ANAEROBIC Blood Culture adequate volume   Culture   Final    NO GROWTH 4 DAYS Performed at [facility], [address], [place], [place] [address]    Report Status PENDING  Incomplete     Labs: BNP (last 3 results) Recent Labs    08/21/19 1830  BNP 224.5*   Basic Metabolic Panel: Recent Labs  Lab 08/21/19 1316 08/21/19 1830 08/22/19 0430 08/22/19 1010 08/23/19 1030 08/24/19 0611 08/25/19 0415  NA   < >  --  131* 128* 135 135 137  K   < >  --  5.4* 6.3* 4.5 4.7 4.6  CL   < >  --  89* 87* 92* 95* 94*  CO2   < >  --  29  26 26 29  32  GLUCOSE   < >  --  143* 174* 160* 201* 298*  BUN   < >  --  138* 146* 72* 79* 85*  CREATININE   < >  --   3.01* 3.21* 1.95* 1.72* 1.69*  CALCIUM   < >  --  8.7* 8.3* 9.3 9.2 9.3  MG  --  3.9*  --   --  3.4*  --   --   PHOS  --  5.5*  --   --  4.5 4.2 3.5   < > = values in this interval not displayed.   Liver Function Tests: Recent Labs  Lab 08/21/19 1316 08/22/19 0430 08/23/19 1030 08/24/19 0611 08/25/19 0415  AST 27 25 32  --   --   ALT 29 32 31  --   --   ALKPHOS 99 103 148*  --   --   BILITOT 0.6 0.8 1.4*  --   --   PROT 5.7* 5.9* 6.6  --   --   ALBUMIN 3.0* 2.8* 3.4* 2.9* 2.8*   Recent Labs  Lab 08/21/19 1316  LIPASE 19   No results for input(s): AMMONIA in the last 168 hours. CBC: Recent Labs  Lab 08/21/19 1316 08/21/19 1830 08/22/19 0430 08/23/19 1030 08/24/19 0611  WBC 13.9*  --  11.5* 9.5 9.6  NEUTROABS  --   --   --  8.1*  --   HGB 11.4*  --  11.6* 11.6* 10.9*  HCT 36.2 31.0* 36.1 36.6 34.0*  MCV 105.2*  --  102.6* 104.3* 105.3*  PLT 187  --  173 172 171   Cardiac Enzymes: No results for input(s): CKTOTAL, CKMB, CKMBINDEX, TROPONINI in the last 168 hours. BNP: Invalid input(s): POCBNP CBG: Recent Labs  Lab 08/24/19 2049 08/25/19 0023 08/25/19 0414 08/25/19 0742 08/25/19 1114  GLUCAP 346* 330* 273* 258* 290*   D-Dimer No results for input(s): DDIMER in the last 72 hours. Hgb A1c No results for input(s): HGBA1C in the last 72 hours. Lipid Profile No results for input(s): CHOL, HDL, LDLCALC, TRIG, CHOLHDL, LDLDIRECT in the last 72 hours. Thyroid function studies No results for input(s): TSH, T4TOTAL, T3FREE, THYROIDAB in the last 72 hours.  Invalid input(s): FREET3 Anemia work up No results for input(s): VITAMINB12, FOLATE, FERRITIN, TIBC, IRON, RETICCTPCT in the last 72 hours. Urinalysis    Component Value Date/Time   COLORURINE YELLOW 08/22/2019 0521   APPEARANCEUR CLEAR 08/22/2019 0521   LABSPEC 1.008 08/22/2019 0521   PHURINE 5.0 08/22/2019 0521   GLUCOSEU NEGATIVE 08/22/2019 0521   HGBUR NEGATIVE 08/22/2019 0521   BILIRUBINUR  NEGATIVE 08/22/2019 0521   KETONESUR NEGATIVE 08/22/2019 0521   PROTEINUR NEGATIVE 08/22/2019 0521   UROBILINOGEN 1.0 12/18/2008 1232   NITRITE NEGATIVE 08/22/2019 0521   LEUKOCYTESUR NEGATIVE 08/22/2019 0521   Sepsis Labs Invalid input(s): PROCALCITONIN,  WBC,  LACTICIDVEN Microbiology Recent Results (from the past 240 hour(s))  SARS CORONAVIRUS 2 (TAT 6-24 HRS) Nasopharyngeal Nasopharyngeal Swab     Status: None   Collection Time: 08/21/19  6:42 PM   Specimen: Nasopharyngeal Swab  Result Value Ref Range Status   SARS Coronavirus 2 NEGATIVE NEGATIVE Final    Comment: (NOTE) SARS-CoV-2 target nucleic acids are NOT DETECTED. The SARS-CoV-2 RNA is generally detectable in upper and lower respiratory specimens during the acute phase of infection. Negative results do not preclude SARS-CoV-2 infection, do not rule out co-infections with other pathogens, and should not be used as the  sole basis for treatment or other patient management decisions. Negative results must be combined with clinical observations, patient history, and epidemiological information. The expected result is Negative. Fact Sheet for Patients: HairSlick.no Fact Sheet for Healthcare Providers: quierodirigir.com This test is not yet approved or cleared by the [place] FDA and  has been authorized for detection and/or diagnosis of SARS-CoV-2 by FDA under an Emergency Use Authorization (EUA). This EUA will remain  in effect (meaning this test can be used) for the duration of the COVID-19 declaration under Section 56 4(b)(1) of the Act, 21 U.S.C. section 360bbb-3(b)(1), unless the authorization is terminated or revoked sooner. Performed at [facility], [address], [place], [place] [address]   Culture, blood (routine x 2)     Status: None (Preliminary result)   Collection Time: 08/21/19  6:49 PM   Specimen: BLOOD  Result Value Ref Range Status    Specimen Description BLOOD SITE NOT SPECIFIED  Final   Special Requests   Final    BOTTLES DRAWN AEROBIC AND ANAEROBIC Blood Culture adequate volume   Culture   Final    NO GROWTH 4 DAYS Performed at [facility], [address] [place], [place] [address]    Report Status PENDING  Incomplete     Time coordinating discharge:  35 minutes  SIGNED:   Dorcas Carrow, MD  [facility] 08/25/2019, 3:43 PM

## 2019-08-25 NOTE — Care Management (Signed)
HH orders faxed to [facility] to resume [facility] services.

## 2019-08-25 NOTE — Progress Notes (Signed)
Admit: 08/21/2019 LOS: 4  8F AoCKD3 from ARB, improving  Subjective:  Marland Kitchen Great UOP on diuretics . HD cath removed . OOB to chair this AM . Feels good, wants to go home  . On amlodipine at prev home dose, BP stable  04/23 0701 - 04/24 0700 In: 240 [P.O.:240] Out: 3350 [Urine:3350]  Filed Weights   08/23/19 0631 08/24/19 0322 08/25/19 0446  Weight: 79.4 kg 78.1 kg 74 kg    Scheduled Meds: . amLODipine  2.5 mg Oral Daily  . aspirin  81 mg Oral Daily  . Chlorhexidine Gluconate Cloth  6 each Topical Q0600  . gabapentin  100 mg Oral BID  . heparin injection (subcutaneous)  5,000 Units Subcutaneous Q8H  . insulin aspart  0-15 Units Subcutaneous TID WC  . insulin aspart  0-5 Units Subcutaneous QHS  . insulin detemir  20 Units Subcutaneous BID  . levothyroxine  88 mcg Oral QAC breakfast  . mouth rinse  15 mL Mouth Rinse BID  . oxyCODONE  10 mg Oral Q12H  . pantoprazole  40 mg Oral Daily  . predniSONE  5 mg Oral BID  . torsemide  60 mg Oral Daily   Continuous Infusions: . sodium chloride    . sodium chloride     PRN Meds:.sodium chloride, sodium chloride, acetaminophen **OR** acetaminophen, alteplase, diazepam, guaiFENesin, heparin, lidocaine (PF), ondansetron **OR** ondansetron (ZOFRAN) IV, oxyCODONE, polyvinyl alcohol  Current Labs: reviewed    Physical Exam:  Blood pressure (!) 190/87, pulse 87, temperature 98 F (36.7 C), temperature source Oral, resp. rate 18, height 4\' 11"  (1.499 m), weight 74 kg, SpO2 96 %. NAD, chronically ill appearing 1-2+ LEE Nonfocal RRR ctab R IJ HD cath site bandaged S/nt/nd CTAB  A 1. AKI on CKD3, s/p HD x1 for inc K and uremic AMS; resolved 2. CKD3, near baseline 3. HTN, recent use of ARB, held; resumed home amlodipine, titrate accordingly 4. RA on chronic s teroids  P . Cont diuretic and amlodipine . Has f/u with , in dc paperwork . No further suggestiosn, ok with DC . Will s/o at this time   [place] MD 08/25/2019,  11:16 AM  Recent Labs  Lab 08/23/19 1030 08/24/19 0611 08/25/19 0415  NA 135 135 137  K 4.5 4.7 4.6  CL 92* 95* 94*  CO2 26 29 32  GLUCOSE 160* 201* 298*  BUN 72* 79* 85*  CREATININE 1.95* 1.72* 1.69*  CALCIUM 9.3 9.2 9.3  PHOS 4.5 4.2 3.5   Recent Labs  Lab 08/22/19 0430 08/23/19 1030 08/24/19 0611  WBC 11.5* 9.5 9.6  NEUTROABS  --  8.1*  --   HGB 11.6* 11.6* 10.9*  HCT 36.1 36.6 34.0*  MCV 102.6* 104.3* 105.3*  PLT 173 172 171

## 2019-08-25 NOTE — Discharge Instructions (Signed)

## 2019-08-25 NOTE — TOC Transition Note (Signed)
Transition of Care [facility]) - CM/SW Discharge Note   Patient Details  Name: [name] MRN: [id number] Date of Birth: [birth year]  [facility] CM/SW Contact:  Lockie Pares, RN Phone Number: 08/25/2019, 11:12 AM   Clinical Narrative:     Patient ready to discharge. [facility] home health faxed orders for home health  RN PT OT.  To [phone].  Spoke with patient and daughter. No further needs identified. NO DME needed.   Final next level of care: Home w Home Health Services Barriers to Discharge: No Barriers Identified   Patient Goals and CMS Choice Patient states their goals for this hospitalization and ongoing recovery are:: To return home with home ehalth and 24 hour care   Choice offered to / list presented to : NA(Per daughter patien tis active with [facility].)  Discharge Placement                       Discharge Plan and Services In-house Referral: NA Discharge Planning Services: CM Consult Post Acute Care Choice: Home Health, Resumption of Svcs/PTA Provider          DME Arranged: N/A DME Agency: NA       HH Arranged: RN, Disease Management, PT, OT HH Agency[facility] Date [facility] Agency Contacted: 08/23/19 Time HH Agency Contacted: 1500 Representative spoke with at [facility] Agency: Alvino Chapel  Social Determinants of Health (SDOH) Interventions     Readmission Risk Interventions Readmission Risk Prevention Plan 08/23/2019  Transportation Screening Complete  HRI or Home Care Consult Complete  Social Work Consult for Recovery Care Planning/Counseling Complete  Palliative Care Screening Complete  Medication Review Oceanographer) Complete  Some recent data might be hidden

## 2019-08-26 LAB — CULTURE, BLOOD (ROUTINE X 2)
Culture: NO GROWTH
Special Requests: ADEQUATE

## 2019-08-28 ENCOUNTER — Ambulatory Visit: Payer: Medicare Other

## 2019-08-29 ENCOUNTER — Other Ambulatory Visit: Payer: Self-pay | Admitting: Pharmacist Clinician (PhC)/ Clinical Pharmacy Specialist

## 2019-08-31 ENCOUNTER — Encounter: Payer: Self-pay | Admitting: Sports Medicine

## 2019-08-31 ENCOUNTER — Ambulatory Visit (INDEPENDENT_AMBULATORY_CARE_PROVIDER_SITE_OTHER): Payer: Medicare Other | Admitting: Sports Medicine

## 2019-08-31 ENCOUNTER — Other Ambulatory Visit: Payer: Self-pay

## 2019-08-31 DIAGNOSIS — L84 Corns and callosities: Secondary | ICD-10-CM

## 2019-08-31 DIAGNOSIS — L97521 Non-pressure chronic ulcer of other part of left foot limited to breakdown of skin: Secondary | ICD-10-CM | POA: Diagnosis not present

## 2019-08-31 DIAGNOSIS — E1142 Type 2 diabetes mellitus with diabetic polyneuropathy: Secondary | ICD-10-CM

## 2019-08-31 DIAGNOSIS — I739 Peripheral vascular disease, unspecified: Secondary | ICD-10-CM

## 2019-08-31 NOTE — Progress Notes (Signed)
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 [address]  Glucose, capillary     Status: Abnormal   Collection Time: 08/24/19  8:03 AM  Result Value Ref Range   Glucose-Capillary 212 (H) 70 - 99 mg/dL    Comment: Glucose reference range applies only to samples taken after fasting for at least 8 hours.   Comment 1 Notify RN    Comment 2 Document in Chart   Glucose, capillary     Status: Abnormal   Collection Time: 08/24/19 11:38 AM  Result Value Ref Range   Glucose-Capillary 211 (H) 70 - 99 mg/dL    Comment: Glucose reference range applies only to samples taken after fasting for at least 8 hours.   Glucose, capillary     Status: Abnormal   Collection Time: 08/24/19  4:34 PM  Result Value Ref Range   Glucose-Capillary 320 (H) 70 - 99 mg/dL    Comment: Glucose reference range applies only to samples taken after fasting for at least 8 hours.  Glucose, capillary     Status: Abnormal   Collection Time: 08/24/19  8:49 PM  Result Value Ref Range   Glucose-Capillary 346 (H) 70 - 99 mg/dL    Comment: Glucose reference range applies only to samples taken after fasting for at least 8 hours.  Glucose, capillary     Status: Abnormal   Collection Time: 08/25/19 12:23 AM  Result Value Ref Range   Glucose-Capillary 330 (H) 70 - 99 mg/dL    Comment: Glucose reference range applies only to samples taken after fasting for at least 8 hours.  Glucose, capillary     Status: Abnormal   Collection Time: 08/25/19  4:14 AM  Result Value Ref Range   Glucose-Capillary 273 (H) 70 - 99 mg/dL  Comment: Glucose reference range applies only to samples taken after fasting for at least 8 hours.  Renal function panel     Status: Abnormal   Collection Time: 08/25/19  4:15 AM  Result Value Ref Range   Sodium 137 135 - 145 mmol/L   Potassium 4.6 3.5 - 5.1 mmol/L   Chloride 94 (L) 98 - 111 mmol/L   CO2 32 22 - 32 mmol/L   Glucose, Bld 298 (H) 70 - 99 mg/dL    Comment: Glucose reference range applies only to samples taken after fasting for at least 8 hours.   BUN 85 (H) 8 - 23 mg/dL   Creatinine, Ser 1.69 (H) 0.44 - 1.00 mg/dL   Calcium 9.3 8.9 - 10.3 mg/dL   Phosphorus 3.5 2.5 - 4.6 mg/dL   Albumin 2.8 (L) 3.5 - 5.0 g/dL   GFR calc non Af Amer 29 (L) >60 mL/min   GFR calc Af Amer 33 (L) >60 mL/min   Anion gap 11 5 - 15    Comment: Performed at [facility] [address] [place], [place] [address]  Glucose, capillary     Status: Abnormal   Collection Time: 08/25/19  7:42 AM  Result Value Ref Range   Glucose-Capillary 258 (H) 70 - 99 mg/dL    Comment: Glucose reference range applies only to  samples taken after fasting for at least 8 hours.  Glucose, capillary     Status: Abnormal   Collection Time: 08/25/19 11:14 AM  Result Value Ref Range   Glucose-Capillary 290 (H) 70 - 99 mg/dL    Comment: Glucose reference range applies only to samples taken after fasting for at least 8 hours.    Objective: There were no vitals filed for this visit.  General: Patient is awake, alert, oriented x 3 and in no acute distress.  Dermatology: Skin is warm and dry bilateral.  To the left forefoot there is a now healed ulceration with reactive callus, no drainage, mild edema, no erythema and no tenderness to touch.  Nails x10 are short and thickened consistent with onychomycosis. Pre-ulcerative callus sub met 4 on right and 1st toe on right with no signs of infection  Vascular: Difficult to discern due 2+ pitting edema   Neurologic: Protective sensation absent bilateral which is unchanged from previous.  Musculosketal: There is no pain palpation to healed ulceration on left  Planus and digital deformity with rigid hallux extensus on right and valgus deformity.   No results for input(s): GRAMSTAIN, LABORGA in the last 8760 hours.  Assessment and Plan:  Problem List Items Addressed This Visit    None    Visit Diagnoses    Ulcer of left foot, limited to breakdown of skin (HCC)    -  Primary   Pre-ulcerative calluses       Diabetic polyneuropathy associated with type 2 diabetes mellitus ([place])       PVD (peripheral vascular disease) ([place])         -Patient seen and evaluated -Reactive callus debrided using chisel blade at left foot without incident -No dressings needed on the left -Advised patient to continue with surgical shoes with offloading padding until her diabetic shoes can arrive -Return to office in 6 to 8 weeks or when called to pick up diabetic shoes.   Landis Martins, DPM

## 2019-09-06 ENCOUNTER — Telehealth: Payer: Self-pay
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
## 2019-09-06 NOTE — Telephone Encounter (Signed)
Called and spoke w/pt regarding rescheduling her htn appt. The pt stated that she isn't interested in doing so.

## 2019-09-10 ENCOUNTER — Emergency Department (HOSPITAL_COMMUNITY): Payer: Medicare Other

## 2019-09-10 ENCOUNTER — Inpatient Hospital Stay (HOSPITAL_COMMUNITY)
Admission: EM | Admit: 2019-09-10 | Discharge: 2019-09-15 | DRG: 682 | Disposition: A | Discharge status: Home Health | Payer: Medicare Other | Attending: Internal Medicine | Admitting: Internal Medicine

## 2019-09-10 ENCOUNTER — Ambulatory Visit: Payer: Medicare Other | Admitting: Adult Health

## 2019-09-10 ENCOUNTER — Encounter (HOSPITAL_COMMUNITY): Payer: Self-pay | Admitting: Emergency Medicine

## 2019-09-10 DIAGNOSIS — Z8349 Family history of other endocrine, nutritional and metabolic diseases: Secondary | ICD-10-CM

## 2019-09-10 DIAGNOSIS — Z91040 Latex allergy status: Secondary | ICD-10-CM

## 2019-09-10 DIAGNOSIS — Z8249 Family history of ischemic heart disease and other diseases of the circulatory system: Secondary | ICD-10-CM

## 2019-09-10 DIAGNOSIS — R34 Anuria and oliguria: Secondary | ICD-10-CM | POA: Diagnosis not present

## 2019-09-10 DIAGNOSIS — Z7952 Long term (current) use of systemic steroids: Secondary | ICD-10-CM

## 2019-09-10 DIAGNOSIS — Z886 Allergy status to analgesic agent status: Secondary | ICD-10-CM

## 2019-09-10 DIAGNOSIS — R001 Bradycardia, unspecified: Secondary | ICD-10-CM | POA: Diagnosis not present

## 2019-09-10 DIAGNOSIS — E1142 Type 2 diabetes mellitus with diabetic polyneuropathy: Secondary | ICD-10-CM | POA: Diagnosis present

## 2019-09-10 DIAGNOSIS — M069 Rheumatoid arthritis, unspecified: Secondary | ICD-10-CM | POA: Diagnosis present

## 2019-09-10 DIAGNOSIS — I130 Hypertensive heart and chronic kidney disease with heart failure and stage 1 through stage 4 chronic kidney disease, or unspecified chronic kidney disease: Secondary | ICD-10-CM | POA: Diagnosis present

## 2019-09-10 DIAGNOSIS — Z888 Allergy status to other drugs, medicaments and biological substances status: Secondary | ICD-10-CM

## 2019-09-10 DIAGNOSIS — Z823 Family history of stroke: Secondary | ICD-10-CM

## 2019-09-10 DIAGNOSIS — M8580 Other specified disorders of bone density and structure, unspecified site: Secondary | ICD-10-CM | POA: Diagnosis present

## 2019-09-10 DIAGNOSIS — J9601 Acute respiratory failure with hypoxia: Secondary | ICD-10-CM | POA: Diagnosis not present

## 2019-09-10 DIAGNOSIS — I10 Essential (primary) hypertension: Secondary | ICD-10-CM

## 2019-09-10 DIAGNOSIS — N179 Acute kidney failure, unspecified: Secondary | ICD-10-CM | POA: Diagnosis not present

## 2019-09-10 DIAGNOSIS — R600 Localized edema: Secondary | ICD-10-CM

## 2019-09-10 DIAGNOSIS — N189 Chronic kidney disease, unspecified: Secondary | ICD-10-CM | POA: Diagnosis present

## 2019-09-10 DIAGNOSIS — Z794 Long term (current) use of insulin: Secondary | ICD-10-CM

## 2019-09-10 DIAGNOSIS — N1832 Chronic kidney disease, stage 3b: Secondary | ICD-10-CM | POA: Diagnosis present

## 2019-09-10 DIAGNOSIS — E860 Dehydration: Secondary | ICD-10-CM | POA: Diagnosis present

## 2019-09-10 DIAGNOSIS — Z807 Family history of other malignant neoplasms of lymphoid, hematopoietic and related tissues: Secondary | ICD-10-CM

## 2019-09-10 DIAGNOSIS — Z79890 Hormone replacement therapy (postmenopausal): Secondary | ICD-10-CM

## 2019-09-10 DIAGNOSIS — K449 Diaphragmatic hernia without obstruction or gangrene: Secondary | ICD-10-CM | POA: Diagnosis present

## 2019-09-10 DIAGNOSIS — M05711 Rheumatoid arthritis with rheumatoid factor of right shoulder without organ or systems involvement: Secondary | ICD-10-CM

## 2019-09-10 DIAGNOSIS — Z882 Allergy status to sulfonamides status: Secondary | ICD-10-CM

## 2019-09-10 DIAGNOSIS — Z20822 Contact with and (suspected) exposure to covid-19: Secondary | ICD-10-CM | POA: Diagnosis present

## 2019-09-10 DIAGNOSIS — L89151 Pressure ulcer of sacral region, stage 1: Secondary | ICD-10-CM | POA: Diagnosis present

## 2019-09-10 DIAGNOSIS — K219 Gastro-esophageal reflux disease without esophagitis: Secondary | ICD-10-CM | POA: Diagnosis present

## 2019-09-10 DIAGNOSIS — F419 Anxiety disorder, unspecified: Secondary | ICD-10-CM | POA: Diagnosis present

## 2019-09-10 DIAGNOSIS — Z7982 Long term (current) use of aspirin: Secondary | ICD-10-CM

## 2019-09-10 DIAGNOSIS — N390 Urinary tract infection, site not specified: Secondary | ICD-10-CM | POA: Diagnosis present

## 2019-09-10 DIAGNOSIS — E038 Other specified hypothyroidism: Secondary | ICD-10-CM

## 2019-09-10 DIAGNOSIS — E039 Hypothyroidism, unspecified: Secondary | ICD-10-CM | POA: Diagnosis present

## 2019-09-10 DIAGNOSIS — M3500 Sicca syndrome, unspecified: Secondary | ICD-10-CM | POA: Diagnosis present

## 2019-09-10 DIAGNOSIS — Z87440 Personal history of urinary (tract) infections: Secondary | ICD-10-CM

## 2019-09-10 DIAGNOSIS — N170 Acute kidney failure with tubular necrosis: Secondary | ICD-10-CM

## 2019-09-10 DIAGNOSIS — I2510 Atherosclerotic heart disease of native coronary artery without angina pectoris: Secondary | ICD-10-CM | POA: Diagnosis present

## 2019-09-10 DIAGNOSIS — J910 Malignant pleural effusion: Secondary | ICD-10-CM | POA: Diagnosis present

## 2019-09-10 DIAGNOSIS — I5032 Chronic diastolic (congestive) heart failure: Secondary | ICD-10-CM | POA: Diagnosis not present

## 2019-09-10 DIAGNOSIS — N183 Chronic kidney disease, stage 3 unspecified: Secondary | ICD-10-CM

## 2019-09-10 DIAGNOSIS — M5030 Other cervical disc degeneration, unspecified cervical region: Secondary | ICD-10-CM | POA: Diagnosis present

## 2019-09-10 DIAGNOSIS — I5033 Acute on chronic diastolic (congestive) heart failure: Secondary | ICD-10-CM | POA: Diagnosis present

## 2019-09-10 DIAGNOSIS — E119 Type 2 diabetes mellitus without complications: Secondary | ICD-10-CM

## 2019-09-10 DIAGNOSIS — L8990 Pressure ulcer of unspecified site, unspecified stage: Secondary | ICD-10-CM | POA: Insufficient documentation

## 2019-09-10 DIAGNOSIS — E785 Hyperlipidemia, unspecified: Secondary | ICD-10-CM | POA: Diagnosis present

## 2019-09-10 DIAGNOSIS — I480 Paroxysmal atrial fibrillation: Secondary | ICD-10-CM | POA: Diagnosis present

## 2019-09-10 DIAGNOSIS — I272 Pulmonary hypertension, unspecified: Secondary | ICD-10-CM | POA: Diagnosis present

## 2019-09-10 DIAGNOSIS — E7800 Pure hypercholesterolemia, unspecified: Secondary | ICD-10-CM

## 2019-09-10 DIAGNOSIS — Z8673 Personal history of transient ischemic attack (TIA), and cerebral infarction without residual deficits: Secondary | ICD-10-CM

## 2019-09-10 DIAGNOSIS — E1122 Type 2 diabetes mellitus with diabetic chronic kidney disease: Secondary | ICD-10-CM | POA: Diagnosis present

## 2019-09-10 LAB — CBC
HCT: 34.4 % — ABNORMAL LOW (ref 36.0–46.0)
Hemoglobin: 10.5 g/dL — ABNORMAL LOW (ref 12.0–15.0)
MCH: 32.7 pg (ref 26.0–34.0)
MCHC: 30.5 g/dL (ref 30.0–36.0)
MCV: 107.2 fL — ABNORMAL HIGH (ref 80.0–100.0)
Platelets: 208 10*3/uL (ref 150–400)
RBC: 3.21 MIL/uL — ABNORMAL LOW (ref 3.87–5.11)
RDW: 14 % (ref 11.5–15.5)
WBC: 9.8 10*3/uL (ref 4.0–10.5)
nRBC: 0.7 % — ABNORMAL HIGH (ref 0.0–0.2)

## 2019-09-10 LAB — BASIC METABOLIC PANEL
Anion gap: 14 (ref 5–15)
BUN: 73 mg/dL — ABNORMAL HIGH (ref 8–23)
CO2: 29 mmol/L (ref 22–32)
Calcium: 9.5 mg/dL (ref 8.9–10.3)
Chloride: 96 mmol/L — ABNORMAL LOW (ref 98–111)
Creatinine, Ser: 2.46 mg/dL — ABNORMAL HIGH (ref 0.44–1.00)
GFR calc Af Amer: 21 mL/min — ABNORMAL LOW (ref >60)
GFR calc non Af Amer: 18 mL/min — ABNORMAL LOW (ref >60)
Glucose, Bld: 227 mg/dL — ABNORMAL HIGH (ref 70–99)
Potassium: 4.2 mmol/L (ref 3.5–5.1)
Sodium: 139 mmol/L (ref 135–145)

## 2019-09-10 LAB — TROPONIN I (HIGH SENSITIVITY)
Troponin I (High Sensitivity): 27 ng/L — ABNORMAL HIGH (ref <18)
Troponin I (High Sensitivity): 24 ng/L — ABNORMAL HIGH (ref <18)

## 2019-09-10 LAB — BRAIN NATRIURETIC PEPTIDE: B Natriuretic Peptide: 507.9 pg/mL — ABNORMAL HIGH (ref 0.0–100.0)

## 2019-09-10 LAB — PHOSPHORUS: Phosphorus: 4.7 mg/dL — ABNORMAL HIGH (ref 2.5–4.6)

## 2019-09-10 MED ORDER — SODIUM CHLORIDE 0.9% FLUSH: 3.0000 mL | Freq: Once | INTRAVENOUS | Status: DC

## 2019-09-10 MED ORDER — SODIUM CHLORIDE 0.9 % IV BOLUS
500.0000 mL | Freq: Once | INTRAVENOUS | Status: AC
  Administered 2019-09-10: 500 mL via INTRAVENOUS
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
## 2019-09-10 NOTE — ED Triage Notes (Signed)
Pt arrives to ED with worsening of swelling and 5lb weight gain in last 2 days. Pt was recently diagnosed with kidney failure on 4/20- pt states she has had no urine output in 3 days. Pt had room air sat of 82% on arrival.

## 2019-09-10 NOTE — ED Notes (Signed)
Bladder scan 70mL of urine. Denies tenderness to lower abd. No need for foley placement at this time per MDTrifan.
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
## 2019-09-11 ENCOUNTER — Observation Stay (HOSPITAL_COMMUNITY): Payer: Medicare Other

## 2019-09-11 ENCOUNTER — Encounter (HOSPITAL_COMMUNITY): Payer: Self-pay | Admitting: Internal Medicine

## 2019-09-11 ENCOUNTER — Other Ambulatory Visit: Payer: Self-pay

## 2019-09-11 DIAGNOSIS — F419 Anxiety disorder, unspecified: Secondary | ICD-10-CM | POA: Diagnosis not present

## 2019-09-11 DIAGNOSIS — Z87440 Personal history of urinary (tract) infections: Secondary | ICD-10-CM

## 2019-09-11 DIAGNOSIS — E860 Dehydration: Secondary | ICD-10-CM | POA: Diagnosis present

## 2019-09-11 DIAGNOSIS — R001 Bradycardia, unspecified: Secondary | ICD-10-CM

## 2019-09-11 DIAGNOSIS — E1122 Type 2 diabetes mellitus with diabetic chronic kidney disease: Secondary | ICD-10-CM | POA: Diagnosis present

## 2019-09-11 DIAGNOSIS — Z807 Family history of other malignant neoplasms of lymphoid, hematopoietic and related tissues: Secondary | ICD-10-CM | POA: Diagnosis not present

## 2019-09-11 DIAGNOSIS — N1832 Chronic kidney disease, stage 3b: Secondary | ICD-10-CM | POA: Diagnosis present

## 2019-09-11 DIAGNOSIS — R609 Edema, unspecified: Secondary | ICD-10-CM

## 2019-09-11 DIAGNOSIS — E1142 Type 2 diabetes mellitus with diabetic polyneuropathy: Secondary | ICD-10-CM | POA: Diagnosis present

## 2019-09-11 DIAGNOSIS — I480 Paroxysmal atrial fibrillation: Secondary | ICD-10-CM | POA: Diagnosis present

## 2019-09-11 DIAGNOSIS — R600 Localized edema: Secondary | ICD-10-CM

## 2019-09-11 DIAGNOSIS — R34 Anuria and oliguria: Secondary | ICD-10-CM | POA: Diagnosis present

## 2019-09-11 DIAGNOSIS — M069 Rheumatoid arthritis, unspecified: Secondary | ICD-10-CM | POA: Diagnosis present

## 2019-09-11 DIAGNOSIS — K219 Gastro-esophageal reflux disease without esophagitis: Secondary | ICD-10-CM | POA: Diagnosis present

## 2019-09-11 DIAGNOSIS — I2510 Atherosclerotic heart disease of native coronary artery without angina pectoris: Secondary | ICD-10-CM | POA: Diagnosis present

## 2019-09-11 DIAGNOSIS — N179 Acute kidney failure, unspecified: Secondary | ICD-10-CM | POA: Diagnosis present

## 2019-09-11 DIAGNOSIS — I5032 Chronic diastolic (congestive) heart failure: Secondary | ICD-10-CM | POA: Diagnosis present

## 2019-09-11 DIAGNOSIS — Z8249 Family history of ischemic heart disease and other diseases of the circulatory system: Secondary | ICD-10-CM | POA: Diagnosis not present

## 2019-09-11 DIAGNOSIS — N170 Acute kidney failure with tubular necrosis: Secondary | ICD-10-CM | POA: Diagnosis not present

## 2019-09-11 DIAGNOSIS — J910 Malignant pleural effusion: Secondary | ICD-10-CM | POA: Diagnosis present

## 2019-09-11 DIAGNOSIS — N183 Chronic kidney disease, stage 3 unspecified: Secondary | ICD-10-CM

## 2019-09-11 DIAGNOSIS — E785 Hyperlipidemia, unspecified: Secondary | ICD-10-CM | POA: Diagnosis present

## 2019-09-11 DIAGNOSIS — E039 Hypothyroidism, unspecified: Secondary | ICD-10-CM | POA: Diagnosis present

## 2019-09-11 DIAGNOSIS — M8580 Other specified disorders of bone density and structure, unspecified site: Secondary | ICD-10-CM | POA: Diagnosis present

## 2019-09-11 DIAGNOSIS — M5030 Other cervical disc degeneration, unspecified cervical region: Secondary | ICD-10-CM | POA: Diagnosis present

## 2019-09-11 DIAGNOSIS — K449 Diaphragmatic hernia without obstruction or gangrene: Secondary | ICD-10-CM | POA: Diagnosis present

## 2019-09-11 DIAGNOSIS — N390 Urinary tract infection, site not specified: Secondary | ICD-10-CM | POA: Diagnosis present

## 2019-09-11 DIAGNOSIS — I130 Hypertensive heart and chronic kidney disease with heart failure and stage 1 through stage 4 chronic kidney disease, or unspecified chronic kidney disease: Secondary | ICD-10-CM | POA: Diagnosis present

## 2019-09-11 DIAGNOSIS — Z7952 Long term (current) use of systemic steroids: Secondary | ICD-10-CM | POA: Diagnosis not present

## 2019-09-11 DIAGNOSIS — Z20822 Contact with and (suspected) exposure to covid-19: Secondary | ICD-10-CM | POA: Diagnosis present

## 2019-09-11 DIAGNOSIS — Z8349 Family history of other endocrine, nutritional and metabolic diseases: Secondary | ICD-10-CM | POA: Diagnosis not present

## 2019-09-11 DIAGNOSIS — J9601 Acute respiratory failure with hypoxia: Secondary | ICD-10-CM | POA: Diagnosis not present

## 2019-09-11 LAB — URINALYSIS, ROUTINE W REFLEX MICROSCOPIC
Bilirubin Urine: NEGATIVE
Glucose, UA: 50 mg/dL — AB
Hgb urine dipstick: NEGATIVE
Ketones, ur: NEGATIVE mg/dL
Nitrite: NEGATIVE
Protein, ur: NEGATIVE mg/dL
Specific Gravity, Urine: 1.011 (ref 1.005–1.030)
pH: 5 (ref 5.0–8.0)

## 2019-09-11 LAB — TROPONIN I (HIGH SENSITIVITY)
Troponin I (High Sensitivity): 20 ng/L — ABNORMAL HIGH (ref <18)
Troponin I (High Sensitivity): 21 ng/L — ABNORMAL HIGH (ref <18)

## 2019-09-11 LAB — COMPREHENSIVE METABOLIC PANEL
ALT: 26 U/L (ref 0–44)
AST: 26 U/L (ref 15–41)
Albumin: 2.7 g/dL — ABNORMAL LOW (ref 3.5–5.0)
Alkaline Phosphatase: 99 U/L (ref 38–126)
Anion gap: 13 (ref 5–15)
BUN: 78 mg/dL — ABNORMAL HIGH (ref 8–23)
CO2: 30 mmol/L (ref 22–32)
Calcium: 9.1 mg/dL (ref 8.9–10.3)
Chloride: 97 mmol/L — ABNORMAL LOW (ref 98–111)
Creatinine, Ser: 2.5 mg/dL — ABNORMAL HIGH (ref 0.44–1.00)
GFR calc Af Amer: 21 mL/min — ABNORMAL LOW (ref >60)
GFR calc non Af Amer: 18 mL/min — ABNORMAL LOW (ref >60)
Glucose, Bld: 121 mg/dL — ABNORMAL HIGH (ref 70–99)
Potassium: 4 mmol/L (ref 3.5–5.1)
Sodium: 140 mmol/L (ref 135–145)
Total Bilirubin: 1.1 mg/dL (ref 0.3–1.2)
Total Protein: 5.8 g/dL — ABNORMAL LOW (ref 6.5–8.1)

## 2019-09-11 LAB — HEMOGLOBIN A1C
Hgb A1c MFr Bld: 8.5 % — ABNORMAL HIGH (ref 4.8–5.6)
Mean Plasma Glucose: 197.25 mg/dL

## 2019-09-11 LAB — CBC
HCT: 32.4 % — ABNORMAL LOW (ref 36.0–46.0)
Hemoglobin: 10.2 g/dL — ABNORMAL LOW (ref 12.0–15.0)
MCH: 33.8 pg (ref 26.0–34.0)
MCHC: 31.5 g/dL (ref 30.0–36.0)
MCV: 107.3 fL — ABNORMAL HIGH (ref 80.0–100.0)
Platelets: 176 10*3/uL (ref 150–400)
RBC: 3.02 MIL/uL — ABNORMAL LOW (ref 3.87–5.11)
RDW: 13.8 % (ref 11.5–15.5)
WBC: 6.7 10*3/uL (ref 4.0–10.5)
nRBC: 0.9 % — ABNORMAL HIGH (ref 0.0–0.2)

## 2019-09-11 LAB — CBG MONITORING, ED
Glucose-Capillary: 101 mg/dL — ABNORMAL HIGH (ref 70–99)
Glucose-Capillary: 130 mg/dL — ABNORMAL HIGH (ref 70–99)
Glucose-Capillary: 132 mg/dL — ABNORMAL HIGH (ref 70–99)

## 2019-09-11 LAB — GLUCOSE, CAPILLARY
Glucose-Capillary: 171 mg/dL — ABNORMAL HIGH (ref 70–99)
Glucose-Capillary: 255 mg/dL — ABNORMAL HIGH (ref 70–99)

## 2019-09-11 LAB — PROTIME-INR
INR: 1 (ref 0.8–1.2)
Prothrombin Time: 13.1 seconds (ref 11.4–15.2)

## 2019-09-11 LAB — SARS CORONAVIRUS 2 BY RT PCR (HOSPITAL ORDER, PERFORMED IN ~~LOC~~ HOSPITAL LAB): SARS Coronavirus 2: NEGATIVE

## 2019-09-11 MED ORDER — POLYETHYLENE GLYCOL 3350 17 G PO PACK: 17.0000 g | PACK | Freq: Every day | ORAL | Status: DC | PRN

## 2019-09-11 MED ORDER — ONDANSETRON HCL 4 MG PO TABS: 4.0000 mg | ORAL_TABLET | Freq: Four times a day (QID) | ORAL | Status: DC | PRN

## 2019-09-11 MED ORDER — ACETAMINOPHEN 325 MG PO TABS
650.0000 mg | ORAL_TABLET | Freq: Four times a day (QID) | ORAL | Status: DC | PRN
  Administered 2019-09-11 – 2019-09-14 (×3): 650 mg via ORAL
  Filled 2019-09-11 (×3): qty 2

## 2019-09-11 MED ORDER — HEPARIN SODIUM (PORCINE) 5000 UNIT/ML IJ SOLN
5000.0000 [IU] | Freq: Three times a day (TID) | INTRAMUSCULAR | Status: DC
  Administered 2019-09-11 – 2019-09-15 (×12): 5000 [IU] via SUBCUTANEOUS
  Filled 2019-09-11 (×13): qty 1

## 2019-09-11 MED ORDER — OXYCODONE HCL ER 10 MG PO T12A
10.0000 mg | EXTENDED_RELEASE_TABLET | Freq: Every day | ORAL | Status: DC
  Administered 2019-09-12 – 2019-09-15 (×5): 10 mg via ORAL
  Filled 2019-09-11 (×5): qty 1

## 2019-09-11 MED ORDER — INSULIN ASPART 100 UNIT/ML ~~LOC~~ SOLN
0.0000 [IU] | Freq: Three times a day (TID) | SUBCUTANEOUS | Status: DC
  Administered 2019-09-11: 2 [IU] via SUBCUTANEOUS
  Administered 2019-09-12 (×3): 3 [IU] via SUBCUTANEOUS
  Administered 2019-09-13: 5 [IU] via SUBCUTANEOUS
  Administered 2019-09-13 (×2): 4 [IU] via SUBCUTANEOUS
  Administered 2019-09-14: 3 [IU] via SUBCUTANEOUS
  Administered 2019-09-14: 5 [IU] via SUBCUTANEOUS
  Administered 2019-09-14: 3 [IU] via SUBCUTANEOUS
  Administered 2019-09-15: 4 [IU] via SUBCUTANEOUS
  Administered 2019-09-15: 3 [IU] via SUBCUTANEOUS

## 2019-09-11 MED ORDER — ONDANSETRON HCL 4 MG/2ML IJ SOLN: 4.0000 mg | Freq: Four times a day (QID) | INTRAMUSCULAR | Status: DC | PRN

## 2019-09-11 MED ORDER — LINACLOTIDE 145 MCG PO CAPS
145.0000 ug | ORAL_CAPSULE | Freq: Every day | ORAL | Status: DC
  Administered 2019-09-11 – 2019-09-15 (×4): 145 ug via ORAL
  Filled 2019-09-11 (×6): qty 1

## 2019-09-11 MED ORDER — ACETAMINOPHEN 650 MG RE SUPP: 650.0000 mg | Freq: Four times a day (QID) | RECTAL | Status: DC | PRN

## 2019-09-11 MED ORDER — ALUM & MAG HYDROXIDE-SIMETH 200-200-20 MG/5ML PO SUSP
30.0000 mL | Freq: Four times a day (QID) | ORAL | Status: DC | PRN
  Administered 2019-09-11 – 2019-09-13 (×3): 30 mL via ORAL
  Filled 2019-09-11 (×3): qty 30

## 2019-09-11 MED ORDER — GABAPENTIN 100 MG PO CAPS
200.0000 mg | ORAL_CAPSULE | Freq: Two times a day (BID) | ORAL | Status: DC
  Administered 2019-09-11 – 2019-09-15 (×10): 200 mg via ORAL
  Filled 2019-09-11 (×10): qty 2

## 2019-09-11 MED ORDER — SODIUM CHLORIDE 0.9 % IV SOLN: INTRAVENOUS | Status: AC

## 2019-09-11 MED ORDER — OXYCODONE HCL 5 MG PO TABS
5.0000 mg | ORAL_TABLET | Freq: Three times a day (TID) | ORAL | Status: DC | PRN
  Administered 2019-09-11 – 2019-09-14 (×6): 5 mg via ORAL
  Filled 2019-09-11 (×6): qty 1

## 2019-09-11 MED ORDER — OXYCODONE HCL ER 10 MG PO T12A
10.0000 mg | EXTENDED_RELEASE_TABLET | Freq: Every day | ORAL | Status: DC
  Administered 2019-09-11: 10 mg via ORAL
  Filled 2019-09-11: qty 1

## 2019-09-11 MED ORDER — INSULIN ASPART 100 UNIT/ML ~~LOC~~ SOLN
0.0000 [IU] | Freq: Every day | SUBCUTANEOUS | Status: DC
  Administered 2019-09-11: 3 [IU] via SUBCUTANEOUS
  Administered 2019-09-12 – 2019-09-13 (×2): 5 [IU] via SUBCUTANEOUS

## 2019-09-11 MED ORDER — INSULIN GLARGINE 100 UNIT/ML ~~LOC~~ SOLN
5.0000 [IU] | Freq: Every day | SUBCUTANEOUS | Status: DC
  Administered 2019-09-11: 5 [IU] via SUBCUTANEOUS
  Filled 2019-09-11 (×4): qty 0.05

## 2019-09-11 MED ORDER — AMLODIPINE BESYLATE 5 MG PO TABS
5.0000 mg | ORAL_TABLET | Freq: Every day | ORAL | Status: DC
  Administered 2019-09-11 – 2019-09-12 (×2): 5 mg via ORAL
  Filled 2019-09-11 (×2): qty 1

## 2019-09-11 MED ORDER — LEVOTHYROXINE SODIUM 88 MCG PO TABS
88.0000 ug | ORAL_TABLET | Freq: Every day | ORAL | Status: DC
  Administered 2019-09-12 – 2019-09-15 (×4): 88 ug via ORAL
  Filled 2019-09-11 (×4): qty 1

## 2019-09-11 MED ORDER — PREDNISONE 5 MG PO TABS
5.0000 mg | ORAL_TABLET | Freq: Two times a day (BID) | ORAL | Status: DC
  Administered 2019-09-11 – 2019-09-15 (×9): 5 mg via ORAL
  Filled 2019-09-11 (×10): qty 1

## 2019-09-11 MED ORDER — DIAZEPAM 2 MG PO TABS
2.0000 mg | ORAL_TABLET | Freq: Every day | ORAL | Status: DC | PRN
  Administered 2019-09-11 – 2019-09-14 (×4): 2 mg via ORAL
  Filled 2019-09-11 (×4): qty 1

## 2019-09-11 MED ORDER — LEVOTHYROXINE SODIUM 88 MCG PO TABS
88.0000 ug | ORAL_TABLET | Freq: Every day | ORAL | Status: DC
  Administered 2019-09-11: 88 ug via ORAL
  Filled 2019-09-11: qty 1

## 2019-09-11 NOTE — Progress Notes (Signed)
PROGRESS NOTE    [name]  GYJ:[id number] DOB: 01/02/1941 DOA: 09/10/2019 PCP: Buckner Malta, MD   Brief Narrative:  [name] is a 79 y.o. female with medical history significant of CAD, chronic diastolic heart failure, CKD stage III, HTN, HLD, PAF taken off Eliquis, CVA, rheumatoid arthritis-on chronic steroids, Sjogren's syndrome, recurrent pleural effusion, chronic pressure ulcer on left foot who presented with complaining of no urine for past 24 hours.  Patient is poor historian and HPI is obtained by chart review and talking to patient and her daughter at bedside. Patient was recently hospitalized for acute on chronic renal failure 420 and 08/25/2019.  She required emergency dialysis x1 with improved renal function upon discharge. Patient reports that she was recently diagnosed with UTI, and started on Keflex twice a day since 09/05/2019.  She felt that her UTI symptoms improved.  She has chronic lower extremity swelling but felt it became worse 3 days ago. So she had B/L Unna boots placed. Over last 24 hours, she felt reduced urine output and actually did not urinate at all.  Associated symptoms included abdominal bloating and generalized weakness.  Her daughter called patient's nephrologist Dr. Marisue Humble yesterday who recommended that she take extra dose of torsemide 40 mg. She normally takes torsemide 60 mg daily so she took a total of 100 mg this morning.  However she still cannot produce urine.  Denies fevers, chills, shortness of breath, wheezing, cough, chest pain/pressure, palpitations, nausea, vomiting, diarrhea, abdominal pain, dysuria, urinary frequency or urgency.  In the emergency room, she was afebrile with pulse 35-60's, RR 22, BP 136/100 and room air O2 sats 99%.  Labs showed BUN 73, creatinine 2.46, phosphorus 4.7, troponin 27/24, proBNP 507, baseline hemoglobin 10.5.  EKG showed atrial fibrillation with no ischemic changes. CXR showed No substantial change  since 08/21/2019. Persistent elevation of the right hemidiaphragm.  Per ED provider, no urine bladder scan at bedside in the ED contacted nephrologist Dr. Allena Katz who recommended giving 500 mL IV fluid bolus.   Hospitalist called to admit.  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 -500 cc bolus in the ED per nephrology, hold diuretics -Renal ultrasound completed, pending -Urine output minimally improving compared to patient's "anuria" at home -Advance diet as tolerated  Intake/Output Summary (Last 24 hours) at 09/11/2019 1359 Last data filed at 09/10/2019 2353 Gross per 24 hour  Intake 633.33 ml  Output --  Net 633.33 ml    Paroxysmal atrial fibrillation with slow ventricular response  -Heart rate notably as low as 35 at intake in the ED -Continue monitoring with telemetry -Hold heart rate control medications -Continue to follow clinically  Heart failure, no acute exacerbation Chronic lymphedema bilateral lower extremities - Patient appears to be somewhat mildly hypervolemic per daughter at bedside compared to baseline -Bilateral lower extremity ultrasound pending - Follow urine output, hold diuretics as above per nephrology - Continue Unna boots and supportive care, wraps as indicated  Hypertension/hyperlipidemia -Continue  home medications, currently well controlled  Diabetes, type II -Continue sliding scale insulin, hypoglycemic protocol  Rheumatoid arthritis, chronic pain -Resume home medications including  prednisone -Continue oxycodone  Hypothyroidism -Home Synthroid   DVT prophylaxis: Eliquis Code Status: Full Family Communication: Daughter updated overnight  Status is: Inpatient  Dispo: The patient is from: Home              Anticipated d/c is to: To be determined              Anticipated d/c date is: Likely 48 to 72 hours              Patient currently not medically stable for discharge given ongoing need for close monitoring, poor urine output, IV medications and possible need for further evaluation and treatment  Consultants:   None  Procedures:   None  Antimicrobials:  None  Subjective: No acute issues or events overnight denies chest pain, shortness of breath, nausea, vomiting, diarrhea, constipation, headache, fevers, chills.  Objective: Vitals:   09/11/19 0330 09/11/19 0400 09/11/19 0500 09/11/19 0600  BP:  117/61 (!) 108/58 133/76  Pulse: (!) 57 (!) 58 61 (!) 52  Resp: (!) 9 10 10  (!) 9  Temp:      TempSrc:      SpO2: 99% 99% 97% 97%    Intake/Output Summary (Last 24 hours) at 09/11/2019 0739 Last data filed at 09/10/2019 2353 Gross per 24 hour  Intake 633.33 ml  Output --  Net 633.33 ml   There were no vitals filed for this visit.  Examination:  General exam: Appears calm and comfortable  Respiratory system: Clear to auscultation. Respiratory effort normal. Cardiovascular system: S1 & S2 heard, RRR. No JVD, murmurs, rubs, gallops or clicks. No pedal edema. Gastrointestinal system: Abdomen is nondistended, soft and nontender. No organomegaly or masses felt. Normal bowel sounds heard. Central nervous system: Alert and oriented. No focal neurological deficits. Extremities: Symmetric 5 x 5 power. Skin: No rashes, lesions or ulcers Psychiatry: Judgement and insight appear normal. Mood & affect appropriate.     Data Reviewed: I have personally reviewed following labs and imaging studies  CBC: Recent Labs  Lab 09/10/19 1347 09/11/19 0312  WBC  9.8 6.7  HGB 10.5* 10.2*  HCT 34.4* 32.4*  MCV 107.2* 107.3*  PLT 208 176   Basic Metabolic Panel: Recent Labs  Lab 09/10/19 1347 09/10/19 2013 09/11/19 0312  NA 139  --  140  K 4.2  --  4.0  CL 96*  --  97*  CO2 29  --  30  GLUCOSE 227*  --  121*  BUN 73*  --  78*  CREATININE 2.46*  --  2.50*  CALCIUM 9.5  --  9.1  PHOS  --  4.7*  --    GFR: CrCl cannot be calculated (Unknown ideal weight.). Liver Function Tests: Recent Labs  Lab 09/11/19 0312  AST 26  ALT 26  ALKPHOS 99  BILITOT 1.1  PROT 5.8*  ALBUMIN 2.7*   No results for input(s): LIPASE, AMYLASE in the last 168 hours. No results for input(s): AMMONIA in the last 168 hours. Coagulation Profile: Recent Labs  Lab 09/11/19 0312  INR 1.0   Cardiac Enzymes: No results for input(s): CKTOTAL, CKMB, CKMBINDEX, TROPONINI in the last 168 hours. BNP (last 3 results) No results for input(s): PROBNP in the last 8760 hours. HbA1C: Recent Labs    09/11/19 0312  HGBA1C 8.5*   CBG: Recent Labs  Lab 09/11/19  0029  GLUCAP 130*   Lipid Profile: No results for input(s): CHOL, HDL, LDLCALC, TRIG, CHOLHDL, LDLDIRECT in the last 72 hours. Thyroid Function Tests: No results for input(s): TSH, T4TOTAL, FREET4, T3FREE, THYROIDAB in the last 72 hours. Anemia Panel: No results for input(s): VITAMINB12, FOLATE, FERRITIN, TIBC, IRON, RETICCTPCT in the last 72 hours. Sepsis Labs: No results for input(s): PROCALCITON, LATICACIDVEN in the last 168 hours.  Recent Results (from the past 240 hour(s))  SARS Coronavirus 2 by RT PCR (hospital order, performed in [facility] hospital lab) Nasopharyngeal Nasopharyngeal Swab     Status: None   Collection Time: 09/10/19 11:10 PM   Specimen: Nasopharyngeal Swab  Result Value Ref Range Status   SARS Coronavirus 2 NEGATIVE NEGATIVE Final    Comment: (NOTE) SARS-CoV-2 target nucleic acids are NOT DETECTED. The SARS-CoV-2 RNA is generally detectable in upper and lower respiratory  specimens during the acute phase of infection. The lowest concentration of SARS-CoV-2 viral copies this assay can detect is 250 copies / mL. A negative result does not preclude SARS-CoV-2 infection and should not be used as the sole basis for treatment or other patient management decisions.  A negative result may occur with improper specimen collection / handling, submission of specimen other than nasopharyngeal swab, presence of viral mutation(s) within the areas targeted by this assay, and inadequate number of viral copies (<250 copies / mL). A negative result must be combined with clinical observations, patient history, and epidemiological information. Fact Sheet for Patients:   BoilerBrush.com.cy Fact Sheet for Healthcare Providers: https://pope.com/ This test is not yet approved or cleared  by the [place] FDA and has been authorized for detection and/or diagnosis of SARS-CoV-2 by FDA under an Emergency Use Authorization (EUA).  This EUA will remain in effect (meaning this test can be used) for the duration of the COVID-19 declaration under Section 564(b)(1) of the Act, 21 U.S.C. section 360bbb-3(b)(1), unless the authorization is terminated or revoked sooner. Performed at [facility], [address], [place], [place] [address]          Radiology Studies: DG Chest 2 View  Result Date: 09/10/2019 CLINICAL DATA:  Shortness of breath EXAM: CHEST - 2 VIEW COMPARISON:  08/21/2019 FINDINGS: Persistent elevation of the right hemidiaphragm presumably related to loculated pleural effusion seen on prior cross-sectional imaging. Persistent interstitial prominence. No pneumothorax. Stable cardiomediastinal contours. Advanced degenerative changes at the shoulders. IMPRESSION: No substantial change since 08/21/2019. Persistent elevation of the right hemidiaphragm probably related to loculated pleural effusion and interstitial  prominence, which may reflect edema. Electronically Signed   By: Guadlupe Spanish M.D.   On: 09/10/2019 14:16   US RENAL  Result Date: 09/11/2019 CLINICAL DATA:  Acute on chronic renal failure EXAM: RENAL / URINARY TRACT ULTRASOUND COMPLETE COMPARISON:  08/21/2019 FINDINGS: Right Kidney: Renal measurements: 7.5 x 4.4 x 4.6 cm = volume: 79 mL. Stable renal cortical thinning. Mild increased renal cortical echotexture unchanged. Left Kidney: Renal measurements: 9.5 x 6.2 by 4.4 cm = volume: 133 mL. Stable renal cortical thinning. Mild increased renal cortical echotexture unchanged. Bladder: Bladder is not visualized due to bowel gas and incomplete distension. Other: None. IMPRESSION: 1. Stable bilateral renal cortical thinning. Increased renal cortical echotexture consistent with medical renal disease. Electronically Signed   By: Sharlet Salina M.D.   On: 09/11/2019 00:26    Scheduled Meds: . amLODipine  5 mg Oral Daily  . gabapentin  200 mg Oral BID  . heparin  5,000 Units Subcutaneous  Q8H  . insulin aspart  0-5 Units Subcutaneous QHS  . insulin aspart  0-6 Units Subcutaneous TID WC  . insulin glargine  5 Units Subcutaneous QHS  . levothyroxine  88 mcg Oral QAC breakfast  . linaclotide  145 mcg Oral Q breakfast  . oxyCODONE  10 mg Oral Daily  . predniSONE  5 mg Oral BID WC  . sodium chloride flush  3 mL Intravenous Once   Continuous Infusions: . sodium chloride 50 mL/hr at 09/11/19 0553     LOS: 0 days   Time spent: 44min  Adden Strout C Lorae Roig, DO [facility]  If 7PM-7AM, please contact night-coverage [email or web address]  09/11/2019, 7:39 AM

## 2019-09-11 NOTE — ED Notes (Signed)
Pt's daughter would like to be contacted when pt is moved to a room.

## 2019-09-11 NOTE — ED Notes (Signed)
Lunch Tray Ordered 

## 2019-09-11 NOTE — Progress Notes (Signed)
Bilateral lower extremity venous duplex completed.  Refer to "CV Proc" under chart review to view preliminary results.  09/11/2019 11:37 AM Eula Fried., MHA, RVT, RDCS, RDMS

## 2019-09-12 DIAGNOSIS — L8990 Pressure ulcer of unspecified site, unspecified stage: Secondary | ICD-10-CM | POA: Insufficient documentation

## 2019-09-12 LAB — URINE CULTURE: Culture: <10000 — AB

## 2019-09-12 LAB — COMPREHENSIVE METABOLIC PANEL
ALT: 25 U/L (ref 0–44)
AST: 25 U/L (ref 15–41)
Albumin: 2.9 g/dL — ABNORMAL LOW (ref 3.5–5.0)
Alkaline Phosphatase: 139 U/L — ABNORMAL HIGH (ref 38–126)
Anion gap: 12 (ref 5–15)
BUN: 72 mg/dL — ABNORMAL HIGH (ref 8–23)
CO2: 30 mmol/L (ref 22–32)
Calcium: 9.4 mg/dL (ref 8.9–10.3)
Chloride: 98 mmol/L (ref 98–111)
Creatinine, Ser: 1.81 mg/dL — ABNORMAL HIGH (ref 0.44–1.00)
GFR calc Af Amer: 31 mL/min — ABNORMAL LOW (ref >60)
GFR calc non Af Amer: 27 mL/min — ABNORMAL LOW (ref >60)
Glucose, Bld: 252 mg/dL — ABNORMAL HIGH (ref 70–99)
Potassium: 4.4 mmol/L (ref 3.5–5.1)
Sodium: 140 mmol/L (ref 135–145)
Total Bilirubin: 0.6 mg/dL (ref 0.3–1.2)
Total Protein: 6.2 g/dL — ABNORMAL LOW (ref 6.5–8.1)

## 2019-09-12 LAB — CBC
HCT: 33.6 % — ABNORMAL LOW (ref 36.0–46.0)
Hemoglobin: 10.6 g/dL — ABNORMAL LOW (ref 12.0–15.0)
MCH: 32.7 pg (ref 26.0–34.0)
MCHC: 31.5 g/dL (ref 30.0–36.0)
MCV: 103.7 fL — ABNORMAL HIGH (ref 80.0–100.0)
Platelets: 197 10*3/uL (ref 150–400)
RBC: 3.24 MIL/uL — ABNORMAL LOW (ref 3.87–5.11)
RDW: 13.5 % (ref 11.5–15.5)
WBC: 7.1 10*3/uL (ref 4.0–10.5)
nRBC: 0.6 % — ABNORMAL HIGH (ref 0.0–0.2)

## 2019-09-12 LAB — GLUCOSE, CAPILLARY
Glucose-Capillary: 280 mg/dL — ABNORMAL HIGH (ref 70–99)
Glucose-Capillary: 253 mg/dL — ABNORMAL HIGH (ref 70–99)
Glucose-Capillary: 259 mg/dL — ABNORMAL HIGH (ref 70–99)
Glucose-Capillary: 274 mg/dL — ABNORMAL HIGH (ref 70–99)
Glucose-Capillary: 353 mg/dL — ABNORMAL HIGH (ref 70–99)

## 2019-09-12 MED ORDER — AMLODIPINE BESYLATE 5 MG PO TABS
5.0000 mg | ORAL_TABLET | Freq: Once | ORAL | Status: AC
  Administered 2019-09-12: 5 mg via ORAL
  Filled 2019-09-12: qty 1

## 2019-09-12 MED ORDER — AMLODIPINE BESYLATE 10 MG PO TABS
10.0000 mg | ORAL_TABLET | Freq: Every day | ORAL | Status: DC
  Administered 2019-09-13 – 2019-09-15 (×3): 10 mg via ORAL
  Filled 2019-09-12 (×3): qty 1

## 2019-09-12 MED ORDER — INSULIN GLARGINE 100 UNIT/ML ~~LOC~~ SOLN
10.0000 [IU] | Freq: Every day | SUBCUTANEOUS | Status: DC
  Filled 2019-09-12 (×2): qty 0.1

## 2019-09-12 NOTE — Progress Notes (Signed)
PROGRESS NOTE    [name]  ERD:[id number] DOB: [birth year] DOA: 09/10/2019 PCP: Buckner Malta, MD   Brief Narrative:  [name] is a 79 y.o. female with medical history significant of CAD, chronic diastolic heart failure, CKD stage III, HTN, HLD, PAF taken off Eliquis, CVA, rheumatoid arthritis-on chronic steroids, Sjogren's syndrome, recurrent pleural effusion, chronic pressure ulcer on left foot who presented with complaining of no urine for past 24 hours.  Patient is poor historian and HPI is obtained by chart review and talking to patient and her daughter at bedside. Patient was recently hospitalized for acute on chronic renal failure 420 and 08/25/2019.  She required emergency dialysis x1 with improved renal function upon discharge. Patient reports that she was recently diagnosed with UTI, and started on Keflex twice a day since 09/05/2019.  She felt that her UTI symptoms improved.  She has chronic lower extremity swelling but felt it became worse 3 days ago. So she had B/L Unna boots placed. Over last 24 hours, she felt reduced urine output and actually did not urinate at all.  Associated symptoms included abdominal bloating and generalized weakness.  Her daughter called patient's nephrologist Dr. Marisue Humble yesterday who recommended that she take extra dose of torsemide 40 mg. She normally takes torsemide 60 mg daily so she took a total of 100 mg this morning.  However she still cannot produce urine.  Denies fevers, chills, shortness of breath, wheezing, cough, chest pain/pressure, palpitations, nausea, vomiting, diarrhea, abdominal pain, dysuria, urinary frequency or urgency.  In the emergency room, she was afebrile with pulse 35-60's, RR 22, BP 136/100 and room air O2 sats 99%.  Labs showed BUN 73, creatinine 2.46, phosphorus 4.7, troponin 27/24, proBNP 507, baseline hemoglobin 10.5.  EKG showed atrial fibrillation with no ischemic changes. CXR showed No substantial change  since 08/21/2019. Persistent elevation of the right hemidiaphragm.  Per ED provider, no urine bladder scan at bedside in the ED contacted nephrologist Dr. Allena Katz who recommended giving 500 mL IV fluid bolus.   Hospitalist called to admit.  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 -500 cc bolus in the ED per nephrology, hold diuretics -Renal ultrasound completed, pending -Urine output minimally improving compared to patient's "anuria" at home -Advance diet as tolerated  Intake/Output Summary (Last 24 hours) at 09/12/2019 1343 Last data filed at 09/12/2019 0330 Gross per 24 hour  Intake 420 ml  Output 1300 ml  Net -880 ml   Acute hypoxic respiratory failure 2/2 volume overload - Secondary to above, resolving - Continue to monitor - ambulatory O2 screen ongoing; 85% on RA today - required 3LNC with exertion to maintain sats >90% - Incentive spirometry as tolerated SpO2: 95 % O2 Flow Rate (L/min): 2 L/min  Paroxysmal atrial fibrillation with slow ventricular response  - Heart rate notably as low as 35 at intake in the ED - Continue monitoring with telemetry - Hold heart  rate control medications  Heart failure, diastolic EF 60 to 65%, no acute exacerbation Chronic lymphedema bilateral lower extremities - Patient appears to be somewhat mildly hypervolemic per daughter at bedside compared to baseline -Bilateral lower extremity ultrasound pending - Follow urine output, hold diuretics as  above per nephrology - Continue Unna boots and supportive care, wraps as indicated  Hypertension/hyperlipidemia - Continue amlodipine, increase to 10 mg daily - Continue to hold clonidine, concern for rebound hypertension/tachycardia given her previous symptoms  Diabetes, type II, uncontrolled - Continue sliding scale insulin, hypoglycemic protocol - On 70/30 at home, holding given poor p.o. status  - Continue Lantus at bedtime, increase to 10 units Lab Results  Component Value Date   HGBA1C 8.5 (H) 09/11/2019   Rheumatoid arthritis, chronic pain -Resume home medications including prednisone -Continue oxycodone  Pressure injury of skin, POA  -Continue care per wound care and nursing documentation  Hypothyroidism -Home Synthroid  DVT prophylaxis: Eliquis Code Status: Full Family Communication: Daughter at bedside  Status is: Inpatient  Dispo: The patient is from: Home              Anticipated d/c is to: Likely home              Anticipated d/c date is: Likely 21 to 72 hours              Patient currently not medically stable for discharge given ongoing need for close monitoring in the setting of acute hypoxia, poor urine output, ongoing need for IV medications and possible further work-up including but not limited to imaging and labs  Consultants:   None  Procedures:   None  Antimicrobials:  None  Subjective: No acute issues or events overnight, swelling appears to be somewhat improving per patient and daughter who is at bedside.  Denies chest pain, nausea, vomiting, diarrhea, constipation, headache, fevers, chills.  Patient does endorse ongoing shortness of breath with exertion as well as with orthopnea  Objective: Vitals:   09/12/19 1009 09/12/19 1125 09/12/19 1323 09/12/19 1327  BP: (!) 172/70 (!) 176/52    Pulse: 93 98 (!) 106 86  Resp:  18    Temp:  98.2 F (36.8 C)    TempSrc:  Oral    SpO2:  91% (!) 85% 95%  Weight:      Height:         Intake/Output Summary (Last 24 hours) at 09/12/2019 1343 Last data filed at 09/12/2019 0330 Gross per 24 hour  Intake 420 ml  Output 1300 ml  Net -880 ml   Filed Weights   09/11/19 1756 09/12/19 0500  Weight: 79.5 kg 77.7 kg    Examination:  General:  Pleasantly resting in bed, No acute distress. HEENT:  Normocephalic atraumatic.  Sclerae nonicteric, noninjected.  Extraocular movements intact bilaterally. Neck:  Without mass or deformity.  Trachea is midline. Lungs: Diffuse bibasilar rales without overt rhonchi or wheeze Heart:  Regular rate and rhythm.  Without murmurs, rubs, or gallops. Abdomen:  Soft, nontender, nondistended.  Without guarding or rebound. Extremities: Without cyanosis, clubbing, bilateral upper and lower extremity edema, 1+ diffusely Vascular:  Dorsalis pedis and posterior tibial pulses palpable bilaterally. Skin:  Warm and dry, no erythema, no ulcerations.  Data Reviewed: I have personally reviewed following labs and imaging studies  CBC: Recent Labs  Lab 09/10/19 1347 09/11/19 0312 09/12/19 0754  WBC 9.8 6.7 7.1  HGB 10.5* 10.2* 10.6*  HCT 34.4* 32.4* 33.6*  MCV 107.2* 107.3* 103.7*  PLT  208 176 197   Basic Metabolic Panel: Recent Labs  Lab 09/10/19 1347 09/10/19 2013 09/11/19 0312 09/12/19 0754  NA 139  --  140 140  K 4.2  --  4.0 4.4  CL 96*  --  97* 98  CO2 29  --  30 30  GLUCOSE 227*  --  121* 252*  BUN 73*  --  78* 72*  CREATININE 2.46*  --  2.50* 1.81*  CALCIUM 9.5  --  9.1 9.4  PHOS  --  4.7*  --   --    GFR: Estimated Creatinine Clearance: 23.4 mL/min (A) (by C-G formula based on SCr of 1.81 mg/dL (H)). Liver Function Tests: Recent Labs  Lab 09/11/19 0312 09/12/19 0754  AST 26 25  ALT 26 25  ALKPHOS 99 139*  BILITOT 1.1 0.6  PROT 5.8* 6.2*  ALBUMIN 2.7* 2.9*   No results for input(s): LIPASE, AMYLASE in the last 168 hours. No results for input(s): AMMONIA in the last 168 hours. Coagulation Profile: Recent Labs   Lab 09/11/19 0312  INR 1.0   Cardiac Enzymes: No results for input(s): CKTOTAL, CKMB, CKMBINDEX, TROPONINI in the last 168 hours. BNP (last 3 results) No results for input(s): PROBNP in the last 8760 hours. HbA1C: Recent Labs    09/11/19 0312  HGBA1C 8.5*   CBG: Recent Labs  Lab 09/11/19 1620 09/11/19 2111 09/11/19 2234 09/12/19 0637 09/12/19 1115  GLUCAP 171* 255* 280* 253* 259*   Lipid Profile: No results for input(s): CHOL, HDL, LDLCALC, TRIG, CHOLHDL, LDLDIRECT in the last 72 hours. Thyroid Function Tests: No results for input(s): TSH, T4TOTAL, FREET4, T3FREE, THYROIDAB in the last 72 hours. Anemia Panel: No results for input(s): VITAMINB12, FOLATE, FERRITIN, TIBC, IRON, RETICCTPCT in the last 72 hours. Sepsis Labs: No results for input(s): PROCALCITON, LATICACIDVEN in the last 168 hours.  Recent Results (from the past 240 hour(s))  SARS Coronavirus 2 by RT PCR (hospital order, performed in [facility] hospital lab) Nasopharyngeal Nasopharyngeal Swab     Status: None   Collection Time: 09/10/19 11:10 PM   Specimen: Nasopharyngeal Swab  Result Value Ref Range Status   SARS Coronavirus 2 NEGATIVE NEGATIVE Final    Comment: (NOTE) SARS-CoV-2 target nucleic acids are NOT DETECTED. The SARS-CoV-2 RNA is generally detectable in upper and lower respiratory specimens during the acute phase of infection. The lowest concentration of SARS-CoV-2 viral copies this assay can detect is 250 copies / mL. A negative result does not preclude SARS-CoV-2 infection and should not be used as the sole basis for treatment or other patient management decisions.  A negative result may occur with improper specimen collection / handling, submission of specimen other than nasopharyngeal swab, presence of viral mutation(s) within the areas targeted by this assay, and inadequate number of viral copies (<250 copies / mL). A negative result must be combined with clinical observations, patient  history, and epidemiological information. Fact Sheet for Patients:   BoilerBrush.com.cy Fact Sheet for Healthcare Providers: https://pope.com/ This test is not yet approved or cleared  by the [place] FDA and has been authorized for detection and/or diagnosis of SARS-CoV-2 by FDA under an Emergency Use Authorization (EUA).  This EUA will remain in effect (meaning this test can be used) for the duration of the COVID-19 declaration under Section 564(b)(1) of the Act, 21 U.S.C. section 360bbb-3(b)(1), unless the authorization is terminated or revoked sooner. Performed at [facility], [address] [place], [place] [address]   Urine culture  Status: Abnormal   Collection Time: 09/11/19  2:55 PM   Specimen: Urine, Random  Result Value Ref Range Status   Specimen Description URINE, RANDOM  Final   Special Requests NONE  Final   Culture (A)  Final    <10,000 COLONIES/mL INSIGNIFICANT GROWTH Performed at [facility], [address], [place], [place] [address]    Report Status 09/12/2019 FINAL  Final     Radiology Studies: DG Chest 2 View  Result Date: 09/10/2019 CLINICAL DATA:  Shortness of breath EXAM: CHEST - 2 VIEW COMPARISON:  08/21/2019 FINDINGS: Persistent elevation of the right hemidiaphragm presumably related to loculated pleural effusion seen on prior cross-sectional imaging. Persistent interstitial prominence. No pneumothorax. Stable cardiomediastinal contours. Advanced degenerative changes at the shoulders. IMPRESSION: No substantial change since 08/21/2019. Persistent elevation of the right hemidiaphragm probably related to loculated pleural effusion and interstitial prominence, which may reflect edema. Electronically Signed   By: Guadlupe Spanish M.D.   On: 09/10/2019 14:16   US RENAL  Result Date: 09/11/2019 CLINICAL DATA:  Acute on chronic renal failure EXAM: RENAL / URINARY TRACT ULTRASOUND COMPLETE  COMPARISON:  08/21/2019 FINDINGS: Right Kidney: Renal measurements: 7.5 x 4.4 x 4.6 cm = volume: 79 mL. Stable renal cortical thinning. Mild increased renal cortical echotexture unchanged. Left Kidney: Renal measurements: 9.5 x 6.2 by 4.4 cm = volume: 133 mL. Stable renal cortical thinning. Mild increased renal cortical echotexture unchanged. Bladder: Bladder is not visualized due to bowel gas and incomplete distension. Other: None. IMPRESSION: 1. Stable bilateral renal cortical thinning. Increased renal cortical echotexture consistent with medical renal disease. Electronically Signed   By: Sharlet Salina M.D.   On: 09/11/2019 00:26   VAS [place] LOWER EXTREMITY VENOUS (DVT)  Result Date: 09/11/2019  Lower Venous DVTStudy Indications: Edema.  Limitations: Body habitus and poor ultrasound/tissue interface. Comparison Study: 09/26/2010- negative lower extremity venous duplex Performing Technologist: Gertie Fey MHA, RDMS, RVT, RDCS  Examination Guidelines: A complete evaluation includes B-mode imaging, spectral Doppler, color Doppler, and power Doppler as needed of all accessible portions of each vessel. Bilateral testing is considered an integral part of a complete examination. Limited examinations for reoccurring indications may be performed as noted. The reflux portion of the exam is performed with the patient in reverse Trendelenburg.  +---------+---------------+---------+-----------+----------+--------------+ RIGHT    CompressibilityPhasicitySpontaneityPropertiesThrombus Aging +---------+---------------+---------+-----------+----------+--------------+ CFV      Full           Yes      Yes                                 +---------+---------------+---------+-----------+----------+--------------+ SFJ      Full                                                        +---------+---------------+---------+-----------+----------+--------------+ FV Prox  Full                                                         +---------+---------------+---------+-----------+----------+--------------+ FV Mid   Full                                                        +---------+---------------+---------+-----------+----------+--------------+  FV Distal                        Yes                                 +---------+---------------+---------+-----------+----------+--------------+ PFV      Full                                                        +---------+---------------+---------+-----------+----------+--------------+ POP      Full           Yes      Yes                                 +---------+---------------+---------+-----------+----------+--------------+ PTV      Full                                                        +---------+---------------+---------+-----------+----------+--------------+ PERO     Full                                                        +---------+---------------+---------+-----------+----------+--------------+   +-------+---------------+---------+-----------+----------+--------------+ LEFT   CompressibilityPhasicitySpontaneityPropertiesThrombus Aging +-------+---------------+---------+-----------+----------+--------------+ CFV    Full           Yes      Yes                                 +-------+---------------+---------+-----------+----------+--------------+ SFJ    Full                                                        +-------+---------------+---------+-----------+----------+--------------+ FV ProxFull                                                        +-------+---------------+---------+-----------+----------+--------------+ FV Mid Full                                                        +-------+---------------+---------+-----------+----------+--------------+ PFV    Full                                                         +-------+---------------+---------+-----------+----------+--------------+  POP    Full           Yes      Yes                                 +-------+---------------+---------+-----------+----------+--------------+ PTV    Full                                                        +-------+---------------+---------+-----------+----------+--------------+   Left Technical Findings: Not visualized segments include distal left femoral vein, peroneal veins.   Summary: RIGHT: - There is no evidence of deep vein thrombosis in the lower extremity. However, portions of this examination were limited- see technologist comments above.  - No cystic structure found in the popliteal fossa.  LEFT: - There is no evidence of deep vein thrombosis in the lower extremity. However, portions of this examination were limited- see technologist comments above.  - No cystic structure found in the popliteal fossa.  *See table(s) above for measurements and observations. Electronically signed by Harold Barban MD on 09/11/2019 at 9:46:34 PM.    Final     Scheduled Meds: . amLODipine  5 mg Oral Daily  . gabapentin  200 mg Oral BID  . heparin  5,000 Units Subcutaneous Q8H  . insulin aspart  0-5 Units Subcutaneous QHS  . insulin aspart  0-6 Units Subcutaneous TID WC  . insulin glargine  5 Units Subcutaneous QHS  . levothyroxine  88 mcg Oral Q0600  . linaclotide  145 mcg Oral Q breakfast  . oxyCODONE  10 mg Oral Daily  . predniSONE  5 mg Oral BID WC  . sodium chloride flush  3 mL Intravenous Once   Continuous Infusions:    LOS: 1 day   Time spent: 85min  Larue Lightner C Jericka Kadar, DO [facility]  If 7PM-7AM, please contact night-coverage [email or web address]  09/12/2019, 1:43 PM

## 2019-09-12 NOTE — Plan of Care (Signed)
  Problem: Education: Goal: Ability to demonstrate management of disease process will improve Outcome: Progressing Goal: Ability to verbalize understanding of medication therapies will improve Outcome: Progressing Goal: Individualized Educational Video(s) Outcome: Progressing   Problem: Activity: Goal: Capacity to carry out activities will improve Outcome: Progressing   Problem: Cardiac: Goal: Ability to achieve and maintain adequate cardiopulmonary perfusion will improve Outcome: Progressing   Problem: Education: Goal: Knowledge of General Education information will improve Description: Including pain rating scale, medication(s)/side effects and non-pharmacologic comfort measures Outcome: Progressing   Problem: Health Behavior/Discharge Planning: Goal: Ability to manage health-related needs will improve Outcome: Progressing   Problem: Clinical Measurements: Goal: Ability to maintain clinical measurements within normal limits will improve Outcome: Progressing Goal: Will remain free from infection Outcome: Progressing Goal: Diagnostic test results will improve Outcome: Progressing Goal: Cardiovascular complication will be avoided Outcome: Progressing   Problem: Activity: Goal: Risk for activity intolerance will decrease Outcome: Progressing   Problem: Nutrition: Goal: Adequate nutrition will be maintained Outcome: Progressing   Problem: Coping: Goal: Level of anxiety will decrease Outcome: Progressing   Problem: Elimination: Goal: Will not experience complications related to bowel motility Outcome: Progressing Goal: Will not experience complications related to urinary retention Outcome: Progressing   Problem: Pain Managment: Goal: General experience of comfort will improve Outcome: Progressing   Problem: Safety: Goal: Ability to remain free from injury will improve Outcome: Progressing   Problem: Skin Integrity: Goal: Risk for impaired skin integrity will  decrease Outcome: Progressing   

## 2019-09-12 NOTE — Progress Notes (Signed)
SATURATION QUALIFICATIONS: (This note is used to comply with regulatory documentation for home oxygen)  Patient Saturations on Room Air at Rest = 85 %  Patient Saturations on Room Air while Ambulating = 74%  Patient Saturations on 3 Liters of oxygen while Ambulating = 95%  Please briefly explain why patient needs home oxygen:

## 2019-09-12 NOTE — Progress Notes (Signed)
Inpatient Diabetes Program Recommendations  AACE/ADA: New Consensus Statement on Inpatient Glycemic Control   Target Ranges:  Prepandial:   less than 140 mg/dL      Peak postprandial:   less than 180 mg/dL (1-2 hours)      Critically ill patients:  140 - 180 mg/dL   Results for AMBRIELLA, KITT (MRN [id number]) as of 09/12/2019 11:37  Ref. Range 09/11/2019 07:43 09/11/2019 12:40 09/11/2019 16:20 09/11/2019 21:11 09/11/2019 22:34 09/12/2019 06:37 09/12/2019 11:15  Glucose-Capillary Latest Ref Range: 70 - 99 mg/dL 423 (H) 953 (H) 202 (H) 255 (H) 280 (H) 253 (H) 259 (H)   Review of Glycemic Control  Diabetes history: DM2 Outpatient Diabetes medications: 70/30 40 units QAM, 70/30 30 units QPM Current orders for Inpatient glycemic control: Lantus 5 units QHS, Novolog 0-6 units TID with meals, Novolog 0-5 units QHS; Prednisone 5 mg BID  Inpatient Diabetes Program Recommendations:   Insulin: If steroids are continued please consider increasing Lantus 8 units QHS and ordering Novolog 3 units TID with meals for meal coverage if patient eats at least 50% of meals.  Thanks,  Orlando Penner, RN, MSN, CDE Diabetes Coordinator Inpatient Diabetes Program [phone] (Team Pager from 8am to 5pm)

## 2019-09-13 LAB — COMPREHENSIVE METABOLIC PANEL
ALT: 26 U/L (ref 0–44)
AST: 22 U/L (ref 15–41)
Albumin: 3 g/dL — ABNORMAL LOW (ref 3.5–5.0)
Alkaline Phosphatase: 136 U/L — ABNORMAL HIGH (ref 38–126)
Anion gap: 12 (ref 5–15)
BUN: 65 mg/dL — ABNORMAL HIGH (ref 8–23)
CO2: 31 mmol/L (ref 22–32)
Calcium: 9.7 mg/dL (ref 8.9–10.3)
Chloride: 97 mmol/L — ABNORMAL LOW (ref 98–111)
Creatinine, Ser: 1.6 mg/dL — ABNORMAL HIGH (ref 0.44–1.00)
GFR calc Af Amer: 36 mL/min — ABNORMAL LOW (ref >60)
GFR calc non Af Amer: 31 mL/min — ABNORMAL LOW (ref >60)
Glucose, Bld: 359 mg/dL — ABNORMAL HIGH (ref 70–99)
Potassium: 4.3 mmol/L (ref 3.5–5.1)
Sodium: 140 mmol/L (ref 135–145)
Total Bilirubin: 1.2 mg/dL (ref 0.3–1.2)
Total Protein: 6.5 g/dL (ref 6.5–8.1)

## 2019-09-13 LAB — GLUCOSE, CAPILLARY
Glucose-Capillary: 301 mg/dL — ABNORMAL HIGH (ref 70–99)
Glucose-Capillary: 343 mg/dL — ABNORMAL HIGH (ref 70–99)
Glucose-Capillary: 354 mg/dL — ABNORMAL HIGH (ref 70–99)
Glucose-Capillary: 390 mg/dL — ABNORMAL HIGH (ref 70–99)

## 2019-09-13 LAB — CBC
HCT: 34.3 % — ABNORMAL LOW (ref 36.0–46.0)
Hemoglobin: 10.8 g/dL — ABNORMAL LOW (ref 12.0–15.0)
MCH: 32.3 pg (ref 26.0–34.0)
MCHC: 31.5 g/dL (ref 30.0–36.0)
MCV: 102.7 fL — ABNORMAL HIGH (ref 80.0–100.0)
Platelets: 214 10*3/uL (ref 150–400)
RBC: 3.34 MIL/uL — ABNORMAL LOW (ref 3.87–5.11)
RDW: 13.8 % (ref 11.5–15.5)
WBC: 7.7 10*3/uL (ref 4.0–10.5)
nRBC: 0.5 % — ABNORMAL HIGH (ref 0.0–0.2)

## 2019-09-13 MED ORDER — INSULIN GLARGINE 100 UNIT/ML ~~LOC~~ SOLN
10.0000 [IU] | Freq: Every day | SUBCUTANEOUS | Status: DC
  Administered 2019-09-13 – 2019-09-15 (×3): 10 [IU] via SUBCUTANEOUS
  Filled 2019-09-13 (×3): qty 0.1

## 2019-09-13 MED ORDER — FUROSEMIDE 10 MG/ML IJ SOLN
60.0000 mg | Freq: Two times a day (BID) | INTRAMUSCULAR | Status: DC
  Administered 2019-09-13 – 2019-09-15 (×5): 60 mg via INTRAVENOUS
  Filled 2019-09-13 (×5): qty 6

## 2019-09-13 MED ORDER — TORSEMIDE 20 MG PO TABS: 20.0000 mg | ORAL_TABLET | Freq: Two times a day (BID) | ORAL | Status: DC

## 2019-09-13 NOTE — Evaluation (Signed)
Physical Therapy Evaluation Patient Details Name: [name] MRN: [id number] DOB: 18-Apr-1941 Today's Date: 09/13/2019   History of Present Illness  79 y.o. female with medical history significant of CAD, chronic diastolic heart failure, CKD stage III, HTN, HLD, PAF taken off Eliquis, CVA, rheumatoid arthritis-on chronic steroids, Sjogren's syndrome, recurrent pleural effusion, chronic pressure ulcer on left foot who presented with complaining of no urine for past 24 hours.    Clinical Impression  Pt admitted with/for AKI, weakness and CHF.  Pt presently needing min to moderate assist from basic mobility. .  Pt currently limited functionally due to the problems listed below.  (see problems list.)  Pt will benefit from PT to maximize function and safety to be able to get home safely with available assist.     Follow Up Recommendations Home health PT;Supervision/Assistance - 24 hour    Equipment Recommendations  None recommended by PT    Recommendations for Other Services       Precautions / Restrictions Precautions Precautions: Fall Restrictions Weight Bearing Restrictions: No      Mobility  Bed Mobility               General bed mobility comments: up in chair  Transfers Overall transfer level: Needs assistance Equipment used: Rolling walker (2 wheeled) Transfers: Sit to/from Stand Sit to Stand: From elevated surface;Mod assist         General transfer comment: assist to facilitate anterior translation>powerup and to control descent. max A to scoot each hip forward.  Ambulation/Gait Ambulation/Gait assistance: Min assist;+2 safety/equipment Gait Distance (Feet): 35 Feet Assistive device: Rolling walker (2 wheeled) Gait Pattern/deviations: Step-through pattern;Decreased step length - right;Decreased step length - left;Decreased stride length   Gait velocity interpretation: <1.31 ft/sec, indicative of household ambulator General Gait Details: pt with  generally steady gait, but with short tentative steps, needing help to maneuver the RW.  Sats remained around 88/89% on 1L [place], but she recovered quickly once sitting   Stairs            Wheelchair Mobility    Modified Rankin (Stroke Patients Only)       Balance Overall balance assessment: Needs assistance Sitting-balance support: Bilateral upper extremity supported;Feet supported Sitting balance-Leahy Scale: Fair     Standing balance support: Bilateral upper extremity supported Standing balance-Leahy Scale: Poor Standing balance comment: reliant on the RW or minimal external support./                             Pertinent Vitals/Pain Pain Assessment: 0-10 Pain Score: 8  Faces Pain Scale: Hurts whole lot Pain Location: back, generalized Pain Descriptors / Indicators: Constant;Aching Pain Intervention(s): Monitored during session;Premedicated before session;Repositioned;Limited activity within patient's tolerance    Home Living Family/patient expects to be discharged to:: Private residence Living Arrangements: Alone Available Help at Discharge: Family;Personal care attendant;Available 24 hours/day Type of Home: House Home Access: Stairs to enter   [employer] of Steps: 1 Home Layout: One level Home Equipment: Walker - 2 wheels;Hospital bed;Shower seat      Prior Function Level of Independence: Needs assistance   Gait / Transfers Assistance Needed: rw and min guard A for household ambulation  ADL's / Homemaking Assistance Needed: max A with UB/LB bathing and dressing. assist with feeding, grooming and bed mobility.         Hand Dominance        Extremity/Trunk Assessment   Upper Extremity Assessment Upper Extremity  Assessment: RUE deficits/detail RUE Deficits / Details: baseline arthritic deformity of joints limiting functional ROM. acute edema of hand and digits noted, decreased ability to grasp walker but able to hold her hands on  walker.  RUE Coordination: decreased fine motor;decreased gross motor LUE Deficits / Details: baseline arthritic deformity of joints limiting functional ROM acute edema of hand and digits noted, decreased ability to grasp walker but able to hold her hands on walker. (P)  LUE Coordination: decreased fine motor;decreased gross motor    Lower Extremity Assessment Lower Extremity Assessment: Generalized weakness    Cervical / Trunk Assessment Cervical / Trunk Assessment: Kyphotic  Communication   Communication: No difficulties  Cognition Arousal/Alertness: Awake/alert Behavior During Therapy: Flat affect Overall Cognitive Status: Within Functional Limits for tasks assessed                                 General Comments: some slow processing, decreased STM noted      General Comments General comments (skin integrity, edema, etc.): Pt able to tolerate 1L O2 (from 2L at start of session). O2 89 post mobility but recovered quickly to 95.     Exercises General Exercises - Lower Extremity Long Arc Quad: AROM;Both;10 reps;Seated   Assessment/Plan    PT Assessment Patient needs continued PT services  PT Problem List Decreased strength;Decreased activity tolerance;Decreased balance;Decreased mobility;Decreased knowledge of use of DME;Cardiopulmonary status limiting activity;Pain       PT Treatment Interventions DME instruction;Gait training;Functional mobility training;Therapeutic activities;Therapeutic exercise;Balance training;Patient/family education    PT Goals (Current goals can be found in the Care Plan section)  Acute Rehab PT Goals Patient Stated Goal: feel better, walk PT Goal Formulation: With patient Time For Goal Achievement: 09/27/19 Potential to Achieve Goals: Good    Frequency Min 3X/week   Barriers to discharge        Co-evaluation PT/OT/SLP Co-Evaluation/Treatment: Yes Reason for Co-Treatment: For patient/therapist safety PT goals addressed  during session: Mobility/safety with mobility OT goals addressed during session: ADL's and self-care       AM-PAC PT "6 Clicks" Mobility  Outcome Measure Help needed turning from your back to your side while in a flat bed without using bedrails?: A Lot Help needed moving from lying on your back to sitting on the side of a flat bed without using bedrails?: A Lot Help needed moving to and from a bed to a chair (including a wheelchair)?: A Lot Help needed standing up from a chair using your arms (e.g., wheelchair or bedside chair)?: A Lot Help needed to walk in hospital room?: A Little Help needed climbing 3-5 steps with a railing? : A Lot 6 Click Score: 13    End of Session Equipment Utilized During Treatment: Oxygen Activity Tolerance: Patient limited by fatigue Patient left: in chair;with call bell/phone within reach;with chair alarm set;with family/visitor present Nurse Communication: Mobility status PT Visit Diagnosis: Unsteadiness on feet (R26.81);Other abnormalities of gait and mobility (R26.89);Difficulty in walking, not elsewhere classified (R26.2)    Time: 5009-3818 PT Time Calculation (min) (ACUTE ONLY): 27 min   Charges:   PT Evaluation $PT Eval Moderate Complexity: 1 Mod          09/13/2019  [name]., PT Acute Rehabilitation Services [phone]  (pager) [phone]  (office)  [name] 09/13/2019, 3:48 PM

## 2019-09-13 NOTE — Progress Notes (Signed)
Inpatient Diabetes Program Recommendations  AACE/ADA: New Consensus Statement on Inpatient Glycemic Control   Target Ranges:  Prepandial:   less than 140 mg/dL      Peak postprandial:   less than 180 mg/dL (1-2 hours)      Critically ill patients:  140 - 180 mg/dL   Results for EVALIN, SHAWHAN (MRN [id number]) as of 09/13/2019 09:23  Ref. Range 09/12/2019 06:37 09/12/2019 11:15 09/12/2019 15:44 09/12/2019 21:19 09/13/2019 06:07  Glucose-Capillary Latest Ref Range: 70 - 99 mg/dL 315 (H)  Novolog 3 units 259 (H)  Novolog 3 units 274 (H)  Novolog 3 units 353 (H)  Novolog 4 units 301 (H)  Novolog 4 units    Review of Glycemic Control  Diabetes history: DM2 Outpatient Diabetes medications: 70/30 40 units QAM, 70/30 30 units QPM Current orders for Inpatient glycemic control: Lantus 10 units QHS, Novolog 0-6 units TID with meals, Novolog 0-5 units QHS; Prednisone 5 mg BID  Inpatient Diabetes Program Recommendations:   Insulin-Basal: Noted Lantus 5 units was NOT GIVEN last night (charted as patient refused). Lantus was increased to 10 units QHS today. May want to change frequency of Lantus to 10 units daily to start now.  Insulin-Meal Coverage: If steroids are continued please consider ordering Novolog 4 units TID with meals for meal coverage if patient eats at least 50% of meals.  Thanks, Orlando Penner, RN, MSN, CDE Diabetes Coordinator Inpatient Diabetes Program [phone] (Team Pager from 8am to 5pm)

## 2019-09-13 NOTE — Progress Notes (Signed)
PROGRESS NOTE    [name]  RVU:[id number] DOB: 1941-03-06 DOA: 09/10/2019 PCP: Buckner Malta, MD   Brief Narrative:  [name] is a 79 y.o. female with medical history significant of CAD, chronic diastolic heart failure, CKD stage III, HTN, HLD, PAF taken off Eliquis, CVA, rheumatoid arthritis-on chronic steroids, Sjogren's syndrome, recurrent pleural effusion, chronic pressure ulcer on left foot who presented with complaining of no urine for past 24 hours.  Patient is poor historian and HPI is obtained by chart review and talking to patient and her daughter at bedside. Patient was recently hospitalized for acute on chronic renal failure 420 and 08/25/2019.  She required emergency dialysis x1 with improved renal function upon discharge. Patient reports that she was recently diagnosed with UTI, and started on Keflex twice a day since 09/05/2019.  She felt that her UTI symptoms improved.  She has chronic lower extremity swelling but felt it became worse 3 days ago. So she had B/L Unna boots placed. Over last 24 hours, she felt reduced urine output and actually did not urinate at all.  Associated symptoms included abdominal bloating and generalized weakness.  Her daughter called patient's nephrologist Dr. Marisue Humble yesterday who recommended that she take extra dose of torsemide 40 mg. She normally takes torsemide 60 mg daily so she took a total of 100 mg this morning.  However she still cannot produce urine.  Denies fevers, chills, shortness of breath, wheezing, cough, chest pain/pressure, palpitations, nausea, vomiting, diarrhea, abdominal pain, dysuria, urinary frequency or urgency.  In the emergency room, she was afebrile with pulse 35-60's, RR 22, BP 136/100 and room air O2 sats 99%.  Labs showed BUN 73, creatinine 2.46, phosphorus 4.7, troponin 27/24, proBNP 507, baseline hemoglobin 10.5.  EKG showed atrial fibrillation with no ischemic changes. CXR showed No substantial change  since 08/21/2019. Persistent elevation of the right hemidiaphragm.  Per ED provider, no urine bladder scan at bedside in the ED contacted nephrologist Dr. Allena Katz who recommended giving 500 mL IV fluid bolus.   Hospitalist called to admit.  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 free water and salt intake -Advance diet as tolerated Lab Results  Component Value Date   CREATININE 1.60 (H) 09/13/2019   CREATININE 1.81 (H) 09/12/2019   CREATININE 2.50 (H) 09/11/2019   Acute hypoxic respiratory failure 2/2 volume overload - Secondary to above, improving - Continue to monitor - ambulatory O2 screen ongoing; 85% on RA today - Incentive spirometry as tolerated SpO2: 96 % O2 Flow Rate (L/min): 3 L/min  Paroxysmal atrial fibrillation with slow ventricular response  - Heart rate notably as low as 35 at intake in the ED - Continue monitoring with telemetry - Hold heart  rate control medications  Heart failure, diastolic EF 60 to 50%, no acute exacerbation Chronic lymphedema bilateral lower extremities - Patient continues to be hypervolemic in the setting of AKI and poor urine output - Follow urine output, hold diuretics as above per nephrology -Bilateral lower  lower extremity ultrasound negative for overt filling defect - Continue Unna boots and supportive care, wraps as indicated  Hypertension/hyperlipidemia - Continue amlodipine, increase to 10 mg daily - Continue to hold clonidine, concern for rebound hypertension/tachycardia given her previous symptoms  Diabetes, type II, uncontrolled - Continue sliding scale insulin, hypoglycemic protocol - On 70/30 at home, holding given poor p.o. status  - Continue Lantus at 10 units Lab Results  Component Value Date   HGBA1C 8.5 (H) 09/11/2019   Rheumatoid arthritis, chronic pain -Resume home medications including prednisone -Continue oxycodone  Pressure injury of skin, POA  -Continue care per wound care and nursing documentation  Hypothyroidism -Home Synthroid  DVT prophylaxis: Eliquis Code Status: Full Family Communication: Daughter at bedside  Status is: Inpatient  Dispo: The patient is from: Home              Anticipated d/c is to: Likely home              Anticipated d/c date is: Likely 48 to 72 hours              Patient currently not medically stable for discharge given ongoing need for close monitoring in the setting of acute hypoxia, poor urine output, ongoing need for supplemental oxygen and possible further work-up including but not limited to imaging and labs  Consultants:   None  Procedures:   None  Antimicrobials:  None  Subjective: No acute issues or events overnight.  Denies chest pain, nausea, vomiting, diarrhea, constipation, headache, fevers, chills.  Patient does endorse ongoing shortness of breath with exertion as well as with orthopnea  Objective: Vitals:   09/12/19 1543 09/12/19 2004 09/13/19 0608 09/13/19 1145  BP: 136/69 (!) 165/95 (!) 157/74 (!) 166/86  Pulse: 72 92 100 84  Resp: 18 18 18 17   Temp: (!) 97.5 F (36.4 C) 98.1 F (36.7 C) 98.3 F (36.8 C) 97.7 F (36.5 C)  TempSrc: Oral Oral Oral Oral  SpO2: 95% 97% 94% 96%  Weight:   79.8 kg     Height:        Intake/Output Summary (Last 24 hours) at 09/13/2019 1355 Last data filed at 09/13/2019 1324 Gross per 24 hour  Intake 940 ml  Output 200 ml  Net 740 ml   Filed Weights   09/11/19 1756 09/12/19 0500 09/13/19 0608  Weight: 79.5 kg 77.7 kg 79.8 kg    Examination:  General:  Pleasantly resting in bed, No acute distress. HEENT:  Normocephalic atraumatic.  Sclerae nonicteric, noninjected.  Extraocular movements intact bilaterally. Neck:  Without mass or deformity.  Trachea is midline. Lungs: Diffuse bibasilar rales without overt rhonchi or wheeze Heart:  Regular rate and rhythm.  Without murmurs, rubs, or gallops. Abdomen:  Soft, nontender, nondistended.  Without guarding or rebound. Extremities: Without cyanosis, clubbing, bilateral upper and lower extremity edema, 1+ diffusely Vascular:  Dorsalis pedis and posterior tibial pulses palpable bilaterally. Skin:  Warm and dry, no erythema, no ulcerations.  Data Reviewed: I have personally reviewed following labs and imaging studies  CBC: Recent Labs  Lab 09/10/19 1347 09/11/19 0312 09/12/19 0754 09/13/19 0737  WBC 9.8 6.7 7.1 7.7  HGB 10.5* 10.2* 10.6* 10.8*  HCT 34.4* 32.4* 33.6*  34.3*  MCV 107.2* 107.3* 103.7* 102.7*  PLT 208 176 197 214   Basic Metabolic Panel: Recent Labs  Lab 09/10/19 1347 09/10/19 2013 09/11/19 0312 09/12/19 0754 09/13/19 0737  NA 139  --  140 140 140  K 4.2  --  4.0 4.4 4.3  CL 96*  --  97* 98 97*  CO2 29  --  30 30 31   GLUCOSE 227*  --  121* 252* 359*  BUN 73*  --  78* 72* 65*  CREATININE 2.46*  --  2.50* 1.81* 1.60*  CALCIUM 9.5  --  9.1 9.4 9.7  PHOS  --  4.7*  --   --   --    GFR: Estimated Creatinine Clearance: 26.9 mL/min (A) (by C-G formula based on SCr of 1.6 mg/dL (H)). Liver Function Tests: Recent Labs  Lab 09/11/19 0312 09/12/19 0754 09/13/19 0737  AST 26 25 22   ALT 26 25 26   ALKPHOS 99 139* 136*  BILITOT 1.1 0.6 1.2  PROT 5.8* 6.2* 6.5  ALBUMIN 2.7*  2.9* 3.0*   No results for input(s): LIPASE, AMYLASE in the last 168 hours. No results for input(s): AMMONIA in the last 168 hours. Coagulation Profile: Recent Labs  Lab 09/11/19 0312  INR 1.0   Cardiac Enzymes: No results for input(s): CKTOTAL, CKMB, CKMBINDEX, TROPONINI in the last 168 hours. BNP (last 3 results) No results for input(s): PROBNP in the last 8760 hours. HbA1C: Recent Labs    09/11/19 0312  HGBA1C 8.5*   CBG: Recent Labs  Lab 09/12/19 1115 09/12/19 1544 09/12/19 2119 09/13/19 0607 09/13/19 1146  GLUCAP 259* 274* 353* 301* 343*   Lipid Profile: No results for input(s): CHOL, HDL, LDLCALC, TRIG, CHOLHDL, LDLDIRECT in the last 72 hours. Thyroid Function Tests: No results for input(s): TSH, T4TOTAL, FREET4, T3FREE, THYROIDAB in the last 72 hours. Anemia Panel: No results for input(s): VITAMINB12, FOLATE, FERRITIN, TIBC, IRON, RETICCTPCT in the last 72 hours. Sepsis Labs: No results for input(s): PROCALCITON, LATICACIDVEN in the last 168 hours.  Recent Results (from the past 240 hour(s))  SARS Coronavirus 2 by RT PCR (hospital order, performed in [facility] hospital lab) Nasopharyngeal Nasopharyngeal Swab     Status: None   Collection Time: 09/10/19 11:10 PM   Specimen: Nasopharyngeal Swab  Result Value Ref Range Status   SARS Coronavirus 2 NEGATIVE NEGATIVE Final    Comment: (NOTE) SARS-CoV-2 target nucleic acids are NOT DETECTED. The SARS-CoV-2 RNA is generally detectable in upper and lower respiratory specimens during the acute phase of infection. The lowest concentration of SARS-CoV-2 viral copies this assay can detect is 250 copies / mL. A negative result does not preclude SARS-CoV-2 infection and should not be used as the sole basis for treatment or other patient management decisions.  A negative result may occur with improper specimen collection / handling, submission of specimen other than nasopharyngeal swab, presence of viral mutation(s)  within the areas targeted by this assay, and inadequate number of viral copies (<250 copies / mL). A negative result must be combined with clinical observations, patient history, and epidemiological information. Fact Sheet for Patients:   09/15/19 Fact Sheet for Healthcare Providers: [facility] This test is not yet approved or cleared  by the 11/10/19 FDA and has been authorized for detection and/or diagnosis of SARS-CoV-2 by FDA under an Emergency Use Authorization (EUA).  This EUA will remain in effect (meaning this test can be used) for the duration of the COVID-19 declaration under Section 564(b)(1) of  the Act, 21 U.S.C. section 360bbb-3(b)(1), unless the authorization is terminated or revoked sooner. Performed at [facility], [address] [place], [place] [address]   Urine culture     Status: Abnormal   Collection Time: 09/11/19  2:55 PM   Specimen: Urine, Random  Result Value Ref Range Status   Specimen Description URINE, RANDOM  Final   Special Requests NONE  Final   Culture (A)  Final    <10,000 COLONIES/mL INSIGNIFICANT GROWTH Performed at [facility], [address], [place], [place] [address]    Report Status 09/12/2019 FINAL  Final     Radiology Studies: No results found.  Scheduled Meds: . amLODipine  10 mg Oral Daily  . furosemide  60 mg Intravenous Q12H  . gabapentin  200 mg Oral BID  . heparin  5,000 Units Subcutaneous Q8H  . insulin aspart  0-5 Units Subcutaneous QHS  . insulin aspart  0-6 Units Subcutaneous TID WC  . insulin glargine  10 Units Subcutaneous Daily  . levothyroxine  88 mcg Oral Q0600  . linaclotide  145 mcg Oral Q breakfast  . oxyCODONE  10 mg Oral Daily  . predniSONE  5 mg Oral BID WC  . sodium chloride flush  3 mL Intravenous Once   Continuous Infusions:    LOS: 2 days   Time spent:  Azucena Fallen, DO [facility]  If  7PM-7AM, please contact night-coverage [email or web address]  09/13/2019, 1:55 PM

## 2019-09-13 NOTE — Evaluation (Signed)
Occupational Therapy Evaluation Patient Details Name: [name] MRN: [id number] DOB: 1941-04-20 Today's Date: 09/13/2019    History of Present Illness 79 y.o. female with medical history significant of CAD, chronic diastolic heart failure, CKD stage III, HTN, HLD, PAF taken off Eliquis, CVA, rheumatoid arthritis-on chronic steroids, Sjogren's syndrome, recurrent pleural effusion, chronic pressure ulcer on left foot who presented with complaining of no urine for past 24 hours.     Clinical Impression   Pt admitted with the above diagnoses and presents with below problem list. Pt will benefit from continued acute OT to address the below listed deficits and maximize independence with basic ADLs prior to d/c back home with 24 hour assist. At baseline, pt needs assist with basic ADLs and utilizes a walker for household distance mobility. She has 24/7 assist at home. Pt currently total A with eating and grooming due to edema in B hands and digits. She is close min guard A to mobilize distance to/from bathroom. Pt able to tolerate weaning from 2L supplemental O2 to 1L (RA at baseline). Pt reports one of her goals is to walk. Discussed utilizing mobility tech for daily walks and plan to follow her acutely.      Follow Up Recommendations  Home health OT;Supervision/Assistance - 24 hour    Equipment Recommendations  None recommended by OT    Recommendations for Other Services       Precautions / Restrictions Precautions Precautions: Fall Restrictions Weight Bearing Restrictions: No      Mobility Bed Mobility               General bed mobility comments: up in chair  Transfers Overall transfer level: Needs assistance Equipment used: Rolling walker (2 wheeled) Transfers: Sit to/from Stand Sit to Stand: From elevated surface;Mod assist         General transfer comment: assist to facilitate anterior translation>powerup and to control descent. max A to scoot each hip  forward.    Balance Overall balance assessment: Needs assistance Sitting-balance support: Bilateral upper extremity supported;Feet supported Sitting balance-Leahy Scale: Fair     Standing balance support: Bilateral upper extremity supported Standing balance-Leahy Scale: Poor                             ADL either performed or assessed with clinical judgement   ADL Overall ADL's : Needs assistance/impaired Eating/Feeding: Total assistance;Sitting   Grooming: Total assistance;Sitting   Upper Body Bathing: Maximal assistance;Sitting   Lower Body Bathing: Maximal assistance;Sit to/from stand   Upper Body Dressing : Total assistance;Sitting   Lower Body Dressing: Maximal assistance;Sit to/from stand   Toilet Transfer: Min guard;Ambulation;RW Toilet Transfer Details (indicate cue type and reason): min guard to walk length of room and back.  Toileting- Clothing Manipulation and Hygiene: Maximal assistance;Sit to/from stand       Functional mobility during ADLs: Moderate assistance;Rolling walker General ADL Comments: Pt received up in recliner. Mobilized bathroom access distance with one brief standing rest break as pt fatigued.      Vision         Perception     Praxis      Pertinent Vitals/Pain Pain Assessment: 0-10 Faces Pain Scale: Hurts whole lot Pain Location: back, generalized Pain Descriptors / Indicators: Constant;Aching Pain Intervention(s): Monitored during session;Repositioned;Premedicated before session;Limited activity within patient's tolerance     Hand Dominance     Extremity/Trunk Assessment Upper Extremity Assessment Upper Extremity Assessment: RUE deficits/detail;LUE deficits/detail RUE  Deficits / Details: baseline arthritic deformity of joints limiting functional ROM. acute edema of hand and digits noted, decreased ability to grasp walker but able to hold her hands on walker.  RUE Coordination: decreased fine motor;decreased gross  motor LUE Deficits / Details: baseline arthritic deformity of joints limiting functional ROM acute edema of hand and digits noted, decreased ability to grasp walker but able to hold her hands on walker. (P)  LUE Coordination: decreased fine motor;decreased gross motor   Lower Extremity Assessment Lower Extremity Assessment: Defer to PT evaluation   Cervical / Trunk Assessment Cervical / Trunk Assessment: Kyphotic   Communication Communication Communication: No difficulties   Cognition Arousal/Alertness: Awake/alert Behavior During Therapy: Flat affect Overall Cognitive Status: Within Functional Limits for tasks assessed                                 General Comments: some slow processing, decreased STM noted   General Comments  Pt able to tolerate 1L O2 (from 2L at start of session). O2 89 post mobility but recovered quickly to 95.     Exercises     Shoulder Instructions      Home Living Family/patient expects to be discharged to:: Private residence Living Arrangements: Alone Available Help at Discharge: Family;Personal care attendant;Available 24 hours/day Type of Home: House Home Access: Stairs to enter [employer] of Steps: 1   Home Layout: One level     Bathroom Shower/Tub: Occupational psychologist: Handicapped height     Home Equipment: Environmental consultant - 2 wheels;Hospital bed;Shower seat          Prior Functioning/Environment Level of Independence: Needs assistance  Gait / Transfers Assistance Needed: rw and min guard A for household ambulation ADL's / Homemaking Assistance Needed: max A with UB/LB bathing and dressing. assist with feeding, grooming and bed mobility.             OT Problem List: Decreased strength;Decreased activity tolerance;Impaired balance (sitting and/or standing);Decreased coordination;Decreased knowledge of use of DME or AE;Decreased knowledge of precautions;Cardiopulmonary status limiting  activity;Obesity;Impaired UE functional use;Pain      OT Treatment/Interventions: Self-care/ADL training;Therapeutic exercise;DME and/or AE instruction;Energy conservation;Therapeutic activities;Patient/family education;Balance training    OT Goals(Current goals can be found in the care plan section) Acute Rehab OT Goals Patient Stated Goal: feel better, walk OT Goal Formulation: With patient Time For Goal Achievement: 09/06/19 Potential to Achieve Goals: Good ADL Goals Pt Will Perform Eating: sitting;with max assist Pt Will Perform Grooming: with max assist;sitting Pt Will Transfer to Toilet: with min assist;ambulating Pt Will Perform Toileting - Clothing Manipulation and hygiene: with mod assist;sitting/lateral leans;sit to/from stand  OT Frequency: Min 2X/week   Barriers to D/C:            Co-evaluation PT/OT/SLP Co-Evaluation/Treatment: Yes Reason for Co-Treatment: For patient/therapist safety;To address functional/ADL transfers   OT goals addressed during session: ADL's and self-care      AM-PAC OT "6 Clicks" Daily Activity     Outcome Measure Help from another person eating meals?: Total Help from another person taking care of personal grooming?: Total Help from another person toileting, which includes using toliet, bedpan, or urinal?: A Lot Help from another person bathing (including washing, rinsing, drying)?: A Lot Help from another person to put on and taking off regular upper body clothing?: A Lot Help from another person to put on and taking off regular lower body clothing?: A Lot  6 Click Score: 10   End of Session Equipment Utilized During Treatment: Gait belt;Rolling walker;Oxygen Nurse Communication: Other (comment);Mobility status(Bumped O2 from 2L to 1L and good toleration so far)  Activity Tolerance: Patient tolerated treatment well;Patient limited by fatigue;Patient limited by pain Patient left: in chair;with call bell/phone within reach;with  family/visitor present  OT Visit Diagnosis: Unsteadiness on feet (R26.81);Muscle weakness (generalized) (M62.81);Pain                Time: 0158-6825 OT Time Calculation (min): 43 min Charges:  OT General Charges $OT Visit: 1 Visit OT Evaluation $OT Eval Moderate Complexity: 1 Mod  Raynald Kemp, OT Acute Rehabilitation Services Pager: [phone] Office: [phone]   Pilar Grammes 09/13/2019, 1:40 PM

## 2019-09-14 LAB — CBC
HCT: 33.3 % — ABNORMAL LOW (ref 36.0–46.0)
Hemoglobin: 10.7 g/dL — ABNORMAL LOW (ref 12.0–15.0)
MCH: 32.9 pg (ref 26.0–34.0)
MCHC: 32.1 g/dL (ref 30.0–36.0)
MCV: 102.5 fL — ABNORMAL HIGH (ref 80.0–100.0)
Platelets: 199 10*3/uL (ref 150–400)
RBC: 3.25 MIL/uL — ABNORMAL LOW (ref 3.87–5.11)
RDW: 13.7 % (ref 11.5–15.5)
WBC: 7.8 10*3/uL (ref 4.0–10.5)
nRBC: 0.6 % — ABNORMAL HIGH (ref 0.0–0.2)

## 2019-09-14 LAB — COMPREHENSIVE METABOLIC PANEL
ALT: 23 U/L (ref 0–44)
AST: 21 U/L (ref 15–41)
Albumin: 2.9 g/dL — ABNORMAL LOW (ref 3.5–5.0)
Alkaline Phosphatase: 119 U/L (ref 38–126)
Anion gap: 15 (ref 5–15)
BUN: 59 mg/dL — ABNORMAL HIGH (ref 8–23)
CO2: 30 mmol/L (ref 22–32)
Calcium: 9.7 mg/dL (ref 8.9–10.3)
Chloride: 94 mmol/L — ABNORMAL LOW (ref 98–111)
Creatinine, Ser: 1.31 mg/dL — ABNORMAL HIGH (ref 0.44–1.00)
GFR calc Af Amer: 45 mL/min — ABNORMAL LOW (ref >60)
GFR calc non Af Amer: 39 mL/min — ABNORMAL LOW (ref >60)
Glucose, Bld: 332 mg/dL — ABNORMAL HIGH (ref 70–99)
Potassium: 4.3 mmol/L (ref 3.5–5.1)
Sodium: 139 mmol/L (ref 135–145)
Total Bilirubin: 1.3 mg/dL — ABNORMAL HIGH (ref 0.3–1.2)
Total Protein: 6.4 g/dL — ABNORMAL LOW (ref 6.5–8.1)

## 2019-09-14 LAB — GLUCOSE, CAPILLARY
Glucose-Capillary: 299 mg/dL — ABNORMAL HIGH (ref 70–99)
Glucose-Capillary: 291 mg/dL — ABNORMAL HIGH (ref 70–99)
Glucose-Capillary: 370 mg/dL — ABNORMAL HIGH (ref 70–99)
Glucose-Capillary: 441 mg/dL — ABNORMAL HIGH (ref 70–99)

## 2019-09-14 MED ORDER — INSULIN ASPART 100 UNIT/ML ~~LOC~~ SOLN
7.0000 [IU] | Freq: Once | SUBCUTANEOUS | Status: AC
  Administered 2019-09-14: 7 [IU] via SUBCUTANEOUS

## 2019-09-14 MED ORDER — HYDRALAZINE HCL 20 MG/ML IJ SOLN: 5.0000 mg | Freq: Three times a day (TID) | INTRAMUSCULAR | Status: DC | PRN

## 2019-09-14 NOTE — Care Management Important Message (Signed)
Important Message  Patient Details  Name: Rosy Estabrook MRN: [id number] Date of Birth: 06-27-1940   Medicare Important Message Given:  Yes     [name] 09/14/2019, 9:41 AM

## 2019-09-14 NOTE — Progress Notes (Signed)
Inpatient Diabetes Program Recommendations  AACE/ADA: New Consensus Statement on Inpatient Glycemic Control   Target Ranges:  Prepandial:   less than 140 mg/dL      Peak postprandial:   less than 180 mg/dL (1-2 hours)      Critically ill patients:  140 - 180 mg/dL   Results for [name], [name] (MRN [id number]) as of 09/14/2019 11:23  Ref. Range 09/13/2019 06:07 09/13/2019 11:46 09/13/2019 13:08 09/13/2019 16:29 09/13/2019 21:51 09/14/2019 06:13  Glucose-Capillary Latest Ref Range: 70 - 99 mg/dL 093 (H)  Novolog 4 units 343 (H)  Novolog 4 units        Lantus 10 units 354 (H)  Novolog 5 units 390 (H)  Novolog 5 units 299 (H)  Novolog 3 units  Lantus 10 units   Review of Glycemic Control  Diabetes history:DM2 Outpatient Diabetes medications:70/30 40 units QAM, 70/30 30 units QPM Current orders for Inpatient glycemic control:Lantus 10 units QHS, Novolog 0-6 units TID with meals, Novolog 0-5 units QHS; Prednisone 5 mg BID  Inpatient Diabetes Program Recommendations:  Insulin-Basal: Please consider increasing Lantus to 10 units BID.  Insulin-Meal Coverage: Per chart patient is eating 50-80% of meals. Please consider ordering Novolog 6 units TID with meals if patient eats at least 50% of meals.  Thanks, Orlando Penner, RN, MSN, CDE Diabetes Coordinator Inpatient Diabetes Program [phone] (Team Pager from 8am to 5pm)

## 2019-09-14 NOTE — Progress Notes (Signed)
Occupational Therapy Treatment Patient Details Name: [name] MRN: [id number] DOB: 10-13-1940 Today's Date: 09/14/2019    History of present illness 79 y.o. female with medical history significant of CAD, chronic diastolic heart failure, CKD stage III, HTN, HLD, PAF taken off Eliquis, CVA, rheumatoid arthritis-on chronic steroids, Sjogren's syndrome, recurrent pleural effusion, chronic pressure ulcer on left foot who presented with complaining of no urine for past 24 hours.     OT comments  Patient continues to make steady progress towards goals in skilled OT session. Patient's session encompassed functional mobility in order to complete ADLs at prior level of function. Pt with significant decrease in bilateral edema in hands, and able to participate in ambulation task in order to progress. Pt able to wean down to 1L of O2 when ambulating, however fatigued quickly requiring seated rest break. Discharge remains appropriate at this time; will continue to follow acutely.    Follow Up Recommendations  Home health OT;Supervision/Assistance - 24 hour    Equipment Recommendations  None recommended by OT    Recommendations for Other Services      Precautions / Restrictions Precautions Precautions: Fall Restrictions Weight Bearing Restrictions: No       Mobility Bed Mobility                  Transfers Overall transfer level: Needs assistance Equipment used: Rolling walker (2 wheeled) Transfers: Sit to/from Stand Sit to Stand: Mod assist         General transfer comment: Mod A from recliner, rocking back and forth to gain momentum to power up    Balance Overall balance assessment: Needs assistance Sitting-balance support: Bilateral upper extremity supported;Feet supported Sitting balance-Leahy Scale: Fair Sitting balance - Comments: sits with BUE support   Standing balance support: Bilateral upper extremity supported Standing balance-Leahy Scale:  Poor Standing balance comment: reliant on the RW or minimal external support./                           ADL either performed or assessed with clinical judgement   ADL                                       Functional mobility during ADLs: Moderate assistance;Rolling walker General ADL Comments: Up in recliner, focus on functional mobility to complete ADLs, fatigued quickly in session     Vision       Perception     Praxis      Cognition Arousal/Alertness: Awake/alert Behavior During Therapy: WFL for tasks assessed/performed Overall Cognitive Status: Within Functional Limits for tasks assessed                                 General Comments: Motivated to move and get back home        Exercises     Shoulder Instructions       General Comments      Pertinent Vitals/ Pain       Pain Assessment: Faces Faces Pain Scale: Hurts little more Pain Location: back, generalized Pain Descriptors / Indicators: Constant;Aching Pain Intervention(s): Limited activity within patient's tolerance;Monitored during session;Repositioned  Home Living  Prior Functioning/Environment              Frequency  Min 2X/week        Progress Toward Goals  OT Goals(current goals can now be found in the care plan section)  Progress towards OT goals: Progressing toward goals  Acute Rehab OT Goals Patient Stated Goal: feel better, walk OT Goal Formulation: With patient Time For Goal Achievement: 09/27/19 Potential to Achieve Goals: Good  Plan Discharge plan remains appropriate    Co-evaluation                 AM-PAC OT "6 Clicks" Daily Activity     Outcome Measure   Help from another person eating meals?: A Little Help from another person taking care of personal grooming?: A Little Help from another person toileting, which includes using toliet, bedpan, or urinal?: A  Lot Help from another person bathing (including washing, rinsing, drying)?: A Lot Help from another person to put on and taking off regular upper body clothing?: A Lot Help from another person to put on and taking off regular lower body clothing?: A Lot 6 Click Score: 14    End of Session Equipment Utilized During Treatment: Gait belt;Rolling walker;Oxygen  OT Visit Diagnosis: Unsteadiness on feet (R26.81);Muscle weakness (generalized) (M62.81);Pain   Activity Tolerance Patient tolerated treatment well;Patient limited by fatigue;Patient limited by pain   Patient Left in chair;with call bell/phone within reach;with family/visitor present   Nurse Communication Other (comment);Mobility status(Was able to decrease O2 back to 1L)        Time: 6160-7371 OT Time Calculation (min): 19 min  Charges: OT General Charges $OT Visit: 1 Visit OT Treatments $Self Care/Home Management : 8-22 mins  Corinne [place], [place] Acute Rehabilitation Services [phone] [place] 09/14/2019, 3:12 PM

## 2019-09-14 NOTE — Progress Notes (Signed)
Patient concerned about BP medication ordered. Page Dr for clarification.

## 2019-09-14 NOTE — Progress Notes (Signed)
SATURATION QUALIFICATIONS: (This note is used to comply with regulatory documentation for home oxygen)  Patient Saturations on Room Air at Rest = 85%  Patient Saturations on Room Air while Ambulating = %  Patient Saturations on 2 Liters of oxygen while Ambulating = 97%  Please briefly explain why patient needs home oxygen:

## 2019-09-14 NOTE — Progress Notes (Signed)
PROGRESS NOTE    [name]  KZS:[id number] DOB: 06-20-1940 DOA: 09/10/2019 PCP: Serita Grammes, MD   Brief Narrative:  [name] is a 79 y.o. female with medical history significant of CAD, chronic diastolic heart failure, CKD stage III, HTN, HLD, PAF taken off Eliquis, CVA, rheumatoid arthritis-on chronic steroids, Sjogren's syndrome, recurrent pleural effusion, chronic pressure ulcer on left foot who presented with complaining of no urine for past 24 hours.  Patient is poor historian and HPI is obtained by chart review and talking to patient and her daughter at bedside. Patient was recently hospitalized for acute on chronic renal failure 420 and 08/25/2019.  She required emergency dialysis x1 with improved renal function upon discharge. Patient reports that she was recently diagnosed with UTI, and started on Keflex twice a day since 09/05/2019.  She felt that her UTI symptoms improved.  She has chronic lower extremity swelling but felt it became worse 3 days ago. So she had B/L Unna boots placed. Over last 24 hours, she felt reduced urine output and actually did not urinate at all.  Associated symptoms included abdominal bloating and generalized weakness.  Her daughter called patient's nephrologist Dr. Joelyn Oms yesterday who recommended that she take extra dose of torsemide 40 mg. She normally takes torsemide 60 mg daily so she took a total of 100 mg this morning.  However she still cannot produce urine.  Denies fevers, chills, shortness of breath, wheezing, cough, chest pain/pressure, palpitations, nausea, vomiting, diarrhea, abdominal pain, dysuria, urinary frequency or urgency.  In the emergency room, she was afebrile with pulse 35-60's, RR 22, BP 136/100 and room air O2 sats 99%.  Labs showed BUN 73, creatinine 2.46, phosphorus 4.7, troponin 27/24, proBNP 507, baseline hemoglobin 10.5.  EKG showed atrial fibrillation with no ischemic changes. CXR showed No substantial change  since 08/21/2019. Persistent elevation of the right hemidiaphragm.  Per ED provider, no urine bladder scan at bedside in the ED contacted nephrologist Dr. Posey Pronto who recommended giving 500 mL IV fluid bolus.   Hospitalist called to admit.  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 free water and salt intake - Advance diet as tolerated Lab Results  Component Value Date   CREATININE 1.31 (H) 09/14/2019   CREATININE 1.60 (H) 09/13/2019   CREATININE 1.81 (H) 09/12/2019   Acute hypoxic respiratory failure 2/2 volume overload - Secondary to above, improving - Continue to monitor - ambulatory O2 screen ongoing; 85% on RA today - Incentive spirometry as tolerated SpO2: 100 % O2 Flow Rate (L/min): 2 L/min  Paroxysmal  atrial fibrillation with slow ventricular response  - Heart rate notably as low as 35 at intake in the ED - Continue monitoring with telemetry - Hold heart rate control medications - discontinue clonidine at discharge given suspicion for rebound hypertension and bradycardia  Heart failure, diastolic EF 60 to 65%, no  acute exacerbation Chronic lymphedema bilateral lower extremities - Patient continues to be hypervolemic in the setting of AKI and poor urine output - Follow urine output, hold diuretics as above per nephrology -Bilateral lower lower extremity ultrasound negative for overt filling defect - Continue Unna boots and supportive care, wraps as indicated  Hypertension/hyperlipidemia - Continue amlodipine, increase to 10 mg daily - Clonidine discontinued as above given concern for bradycardia  Diabetes, type II, uncontrolled - Continue sliding scale insulin, hypoglycemic protocol - On 70/30 at home, holding given poor p.o. status  - Continue Lantus at 10 units Lab Results  Component Value Date   HGBA1C 8.5 (H) 09/11/2019   Rheumatoid arthritis, chronic pain -Resume home medications including prednisone -Continue oxycodone  Pressure injury of skin, POA  -Continue care per wound care and nursing documentation  Hypothyroidism -Home Synthroid  DVT prophylaxis: Eliquis Code Status: Full Family Communication: Daughter at bedside  Status is: Inpatient  Dispo: The patient is from: Home              Anticipated d/c is to: Likely home              Anticipated d/c date is: Likely 24 to 48 hours              Patient currently not medically stable for discharge given ongoing need for close monitoring in the setting of acute hypoxia, and dyspnea with exertion.  Likely to improve with ongoing diuresis.  Consultants:   None  Procedures:   None  Antimicrobials:  None  Subjective: No acute issues or events overnight.  Denies chest pain, nausea, vomiting, diarrhea, constipation, headache, fevers, chills.  Patient continues to remark on shortness of breath with exertion but at rest feels remarkably well and "back to baseline"  Objective: Vitals:   09/14/19 0448 09/14/19 0956 09/14/19 0957 09/14/19 1123  BP: (!) 181/85   (!) 162/80  Pulse: 83   96  Resp: 18   20  Temp: 98.6 F (37 C)    98.4 F (36.9 C)  TempSrc: Oral   Oral  SpO2: 92% (!) 85% 97% 100%  Weight:      Height:        Intake/Output Summary (Last 24 hours) at 09/14/2019 1347 Last data filed at 09/14/2019 1251 Gross per 24 hour  Intake 660 ml  Output 4200 ml  Net -3540 ml   Filed Weights   09/12/19 0500 09/13/19 0608 09/14/19 0100  Weight: 77.7 kg 79.8 kg 77.4 kg    Examination:  General:  Pleasantly resting in bed, No acute distress. HEENT:  Normocephalic atraumatic.  Sclerae nonicteric, noninjected.  Extraocular movements intact bilaterally. Neck:  Without mass or deformity.  Trachea is midline. Lungs: Scant bibasilar rales without overt rhonchi or wheeze Heart:  Regular rate and rhythm.  Without murmurs, rubs, or gallops. Abdomen:  Soft, nontender, nondistended.  Without guarding or rebound. Extremities: Without cyanosis, clubbing; scant 1+ pitting edema bilateral lower extremities, upper extremities appear without overt edema Vascular:  Dorsalis pedis and posterior tibial pulses palpable bilaterally. Skin:  Warm and dry, no erythema, no ulcerations.  Data Reviewed: I have personally reviewed following labs and imaging studies  CBC:  Recent Labs  Lab 09/10/19 1347 09/11/19 0312 09/12/19 0754 09/13/19 0737 09/14/19 0620  WBC 9.8 6.7 7.1 7.7 7.8  HGB 10.5* 10.2* 10.6* 10.8* 10.7*  HCT 34.4* 32.4* 33.6* 34.3* 33.3*  MCV 107.2* 107.3* 103.7* 102.7* 102.5*  PLT 208 176 197 214 199   Basic Metabolic Panel: Recent Labs  Lab 09/10/19 1347 09/10/19 2013 09/11/19 0312 09/12/19 0754 09/13/19 0737 09/14/19 0620  NA 139  --  140 140 140 139  K 4.2  --  4.0 4.4 4.3 4.3  CL 96*  --  97* 98 97* 94*  CO2 29  --  30 30 31 30   GLUCOSE 227*  --  121* 252* 359* 332*  BUN 73*  --  78* 72* 65* 59*  CREATININE 2.46*  --  2.50* 1.81* 1.60* 1.31*  CALCIUM 9.5  --  9.1 9.4 9.7 9.7  PHOS  --  4.7*  --   --   --   --    GFR: Estimated Creatinine Clearance: 32.3 mL/min (A) (by C-G formula based on  SCr of 1.31 mg/dL (H)). Liver Function Tests: Recent Labs  Lab 09/11/19 0312 09/12/19 0754 09/13/19 0737 09/14/19 0620  AST 26 25 22 21   ALT 26 25 26 23   ALKPHOS 99 139* 136* 119  BILITOT 1.1 0.6 1.2 1.3*  PROT 5.8* 6.2* 6.5 6.4*  ALBUMIN 2.7* 2.9* 3.0* 2.9*   No results for input(s): LIPASE, AMYLASE in the last 168 hours. No results for input(s): AMMONIA in the last 168 hours. Coagulation Profile: Recent Labs  Lab 09/11/19 0312  INR 1.0   Cardiac Enzymes: No results for input(s): CKTOTAL, CKMB, CKMBINDEX, TROPONINI in the last 168 hours. BNP (last 3 results) No results for input(s): PROBNP in the last 8760 hours. HbA1C: No results for input(s): HGBA1C in the last 72 hours. CBG: Recent Labs  Lab 09/13/19 1146 09/13/19 1629 09/13/19 2151 09/14/19 0613 09/14/19 1124  GLUCAP 343* 354* 390* 299* 291*   Lipid Profile: No results for input(s): CHOL, HDL, LDLCALC, TRIG, CHOLHDL, LDLDIRECT in the last 72 hours. Thyroid Function Tests: No results for input(s): TSH, T4TOTAL, FREET4, T3FREE, THYROIDAB in the last 72 hours. Anemia Panel: No results for input(s): VITAMINB12, FOLATE, FERRITIN, TIBC, IRON, RETICCTPCT in the last 72 hours. Sepsis Labs: No results for input(s): PROCALCITON, LATICACIDVEN in the last 168 hours.  Recent Results (from the past 240 hour(s))  SARS Coronavirus 2 by RT PCR (hospital order, performed in [facility] hospital lab) Nasopharyngeal Nasopharyngeal Swab     Status: None   Collection Time: 09/10/19 11:10 PM   Specimen: Nasopharyngeal Swab  Result Value Ref Range Status   SARS Coronavirus 2 NEGATIVE NEGATIVE Final    Comment: (NOTE) SARS-CoV-2 target nucleic acids are NOT DETECTED. The SARS-CoV-2 RNA is generally detectable in upper and lower respiratory specimens during the acute phase of infection. The lowest concentration of SARS-CoV-2 viral copies this assay can detect is 250 copies / mL. A negative result does not preclude SARS-CoV-2  infection and should not be used as the sole basis for treatment or other patient management decisions.  A negative result may occur with improper specimen collection / handling, submission of specimen other than nasopharyngeal swab, presence of viral mutation(s) within the areas targeted by this assay, and inadequate number of viral copies (<250 copies / mL). A negative result must be combined with clinical observations, patient history, and epidemiological information. Fact Sheet for Patients:   09/16/19 Fact Sheet for Healthcare Providers: [facility] This  test is not yet approved or cleared  by the [place] and has been authorized for detection and/or diagnosis of SARS-CoV-2 by FDA under an Emergency Use Authorization (EUA).  This EUA will remain in effect (meaning this test can be used) for the duration of the COVID-19 declaration under Section 564(b)(1) of the Act, 21 U.S.C. section 360bbb-3(b)(1), unless the authorization is terminated or revoked sooner. Performed at [facility], [address], [place], [place] [address]   Urine culture     Status: Abnormal   Collection Time: 09/11/19  2:55 PM   Specimen: Urine, Random  Result Value Ref Range Status   Specimen Description URINE, RANDOM  Final   Special Requests NONE  Final   Culture (A)  Final    <10,000 COLONIES/mL INSIGNIFICANT GROWTH Performed at [facility], [address], [place], [place] [address]    Report Status 09/12/2019 FINAL  Final     Radiology Studies: No results found.  Scheduled Meds: . amLODipine  10 mg Oral Daily  . furosemide  60 mg Intravenous Q12H  . gabapentin  200 mg Oral BID  . heparin  5,000 Units Subcutaneous Q8H  . insulin aspart  0-5 Units Subcutaneous QHS  . insulin aspart  0-6 Units Subcutaneous TID WC  . insulin glargine  10 Units Subcutaneous Daily  . levothyroxine  88 mcg Oral Q0600  .  linaclotide  145 mcg Oral Q breakfast  . oxyCODONE  10 mg Oral Daily  . predniSONE  5 mg Oral BID WC  . sodium chloride flush  3 mL Intravenous Once   Continuous Infusions:    LOS: 3 days   Time spent:  Azucena Fallen, DO [facility]  If 7PM-7AM, please contact night-coverage [email or web address]  09/14/2019, 1:47 PM

## 2019-09-15 LAB — COMPREHENSIVE METABOLIC PANEL
ALT: 19 U/L (ref 0–44)
AST: 16 U/L (ref 15–41)
Albumin: 3 g/dL — ABNORMAL LOW (ref 3.5–5.0)
Alkaline Phosphatase: 112 U/L (ref 38–126)
Anion gap: 11 (ref 5–15)
BUN: 52 mg/dL — ABNORMAL HIGH (ref 8–23)
CO2: 36 mmol/L — ABNORMAL HIGH (ref 22–32)
Calcium: 9.6 mg/dL (ref 8.9–10.3)
Chloride: 94 mmol/L — ABNORMAL LOW (ref 98–111)
Creatinine, Ser: 1.31 mg/dL — ABNORMAL HIGH (ref 0.44–1.00)
GFR calc Af Amer: 45 mL/min — ABNORMAL LOW (ref >60)
GFR calc non Af Amer: 39 mL/min — ABNORMAL LOW (ref >60)
Glucose, Bld: 333 mg/dL — ABNORMAL HIGH (ref 70–99)
Potassium: 4 mmol/L (ref 3.5–5.1)
Sodium: 141 mmol/L (ref 135–145)
Total Bilirubin: 0.7 mg/dL (ref 0.3–1.2)
Total Protein: 6.4 g/dL — ABNORMAL LOW (ref 6.5–8.1)

## 2019-09-15 LAB — CBC
HCT: 35.4 % — ABNORMAL LOW (ref 36.0–46.0)
Hemoglobin: 11.2 g/dL — ABNORMAL LOW (ref 12.0–15.0)
MCH: 32.3 pg (ref 26.0–34.0)
MCHC: 31.6 g/dL (ref 30.0–36.0)
MCV: 102 fL — ABNORMAL HIGH (ref 80.0–100.0)
Platelets: 197 10*3/uL (ref 150–400)
RBC: 3.47 MIL/uL — ABNORMAL LOW (ref 3.87–5.11)
RDW: 13.4 % (ref 11.5–15.5)
WBC: 7.2 10*3/uL (ref 4.0–10.5)
nRBC: 0.6 % — ABNORMAL HIGH (ref 0.0–0.2)

## 2019-09-15 LAB — GLUCOSE, CAPILLARY
Glucose-Capillary: 284 mg/dL — ABNORMAL HIGH (ref 70–99)
Glucose-Capillary: 304 mg/dL — ABNORMAL HIGH (ref 70–99)
Glucose-Capillary: 358 mg/dL — ABNORMAL HIGH (ref 70–99)

## 2019-09-15 MED ORDER — AMLODIPINE BESYLATE 10 MG PO TABS: 10.0000 mg | ORAL_TABLET | Freq: Every day | ORAL | 0 refills | Status: AC

## 2019-09-15 NOTE — Care Management (Addendum)
Spoke w patient and daughter at bedside. Discussed home oxygen needs. Referral made to [facility] for delivery of portable O2 to room today and set up of home concentrator. Faxed DC summary and HH orders to [facility] for continuation of [facility] services. [phone]

## 2019-09-15 NOTE — Discharge Summary (Signed)
Physician Discharge Summary  [name] VOH:[id number] DOB: 1941-04-10 DOA: 09/10/2019  PCP: Serita Grammes, MD  Admit date: 09/10/2019 Discharge date: 09/15/2019  Admitted From: Inpatient Disposition: home  Recommendations for Outpatient Follow-up:  1. Follow up with PCP in 1-2 weeks 2. :  Home Health:No Equipment/Devices:home 02  Discharge Condition:Stable CODE STATUS:Full code Diet recommendation: Diabetic diet  Brief/Interim Summary: [name] a 79 y.o.femalewith medical history significant ofCAD,chronic diastolic heart failure, CKD stage III,HTN,HLD,PAFtaken off Eliquis,CVA,rheumatoid arthritis-on chronic steroids, Sjogren's syndrome, recurrent pleural effusion,chronicpressure ulcer on left foot who presented with complaining of no urine for past 24 hours. Patient is poor historian and HPI is obtained by chart review and talking to patient and her daughter at bedside. Patient was recently hospitalized for acute on chronic renal failure420 and 08/25/2019.She required emergency dialysis x1 with improved renal function upon discharge. Patient reports that she was recently diagnosed with UTI, and started on Keflex twice a day since 09/05/2019.She felt that her UTI symptoms improved. She has chronic lower extremity swelling but felt it became worse 3 days ago. So she had B/LUnna boots placed. Over last 24 hours,she felt reduced urine output and actually did not urinate at all.Associated symptoms included abdominal bloating and generalized weakness. Her daughter called patient's nephrologist Dr. Joelyn Oms yesterday who recommended that she take extra dose of torsemide 40 mg. Shenormally takes torsemide 60 mg daily so she took a total of 100 mg this morning.However she still cannot produce urine.Denies fevers, chills, shortness of breath, wheezing, cough, chest pain/pressure, palpitations, nausea, vomiting, diarrhea, abdominal pain, dysuria,  urinary frequency or urgency. In the emergency room, she was afebrile with pulse 35-60's,RR 22, BP 136/100 and room air O2 sats 99%. Labs showed BUN 73, creatinine 2.46, phosphorus 4.7, troponin 27/24, proBNP 507, baseline hemoglobin 10.5.EKG showed atrial fibrillation with no ischemic changes. CXRshowedNo substantial change since 08/21/2019. Persistent elevation of the right hemidiaphragm.Per ED provider, no urine bladder scan at bedside in the ED contacted nephrologist Dr. Posey Pronto who recommended giving 500 mL IV fluid bolus.  Hospitalist called to admit.  HOSPITALIST COURSE: AKI on CKD 3b with anuria, resolving - Kidney failure likely in the setting of overt dehydration and overdiuresis at home resolved after 500 cc bolus IV fluid in the ED per nephrology - Renal ultrasound completed, without renal artery stenosis - Urine output improvED over the past 72 hours - essentially back to baseline at this point - Resume IV diuretics, follow I's and O's, limit p.o. free water and salt intake  Acute hypoxic respiratory failure 2/2 volume overload - Secondary to above, improving - Continue to monitor - ambulatory O2 screen ongoing; 85% on RA today - Incentive spirometry as tolerated SpO2: 100 % O2 Flow Rate (L/min): 2 L/min-UNABLE TO TITRATE TO OFF Pt did not want to remain in hospital and will be discharged on home 02-already arranged thru SW/CM  Paroxysmal atrial fibrillation with slow ventricular response  - Heart rate notably as low as 35 at intake in the ED - Continue monitoring with telemetry - Discontinued clonidine on d/c, increased amlodipine to 10 mg  Heart failure, diastolic EF 60 to 69%, no acute exacerbation Chronic lymphedema bilateral lower extremities - returned to baseline  Hypertension/hyperlipidemia - Continue amlodipine, increased to 10 mg daily - Clonidine discontinued as above given concern for bradycardia  Diabetes, type II, uncontrolled -pt to resume home  medication regimen -received dietary coun sleing as a1c is above target  Rheumatoid arthritis, chronic pain -Resume home medications including prednisone -Continue  oxycodone per outside provider  Pressure injury of skin, POA  -Continue care per wound care and nursing documentation  Hypothyroidism -Home Synthroid   Discharge Diagnoses:  Principal Problem:   Acute on chronic renal failure (HCC) Active Problems:   Rheumatoid arthritis (HCC)   Coronary artery disease   PAF (paroxysmal atrial fibrillation) (HCC)   Anxiety   Hyperlipidemia   Hypertension   Hypothyroidism   Type 2 diabetes mellitus without complications (HCC)   DM (diabetes mellitus) (HCC)   Chronic diastolic heart failure (HCC)   CKD (chronic kidney disease), stage III   Bradycardia   Edema of both legs   History of UTI   Anuria   Pressure injury of skin    Discharge Instructions  Discharge Instructions    (HEART FAILURE PATIENTS) Call MD:  Anytime you have any of the following symptoms: 1) 3 pound weight gain in 24 hours or 5 pounds in 1 week 2) shortness of breath, with or without a dry hacking cough 3) swelling in the hands, feet or stomach 4) if you have to sleep on extra pillows at night in order to breathe.   Complete by: As directed    Call MD for:   Complete by: As directed    For any acute change in medical condition   Call MD for:  difficulty breathing, headache or visual disturbances   Complete by: As directed    Call MD for:  extreme fatigue   Complete by: As directed    Call MD for:  hives   Complete by: As directed    Call MD for:  persistant dizziness or light-headedness   Complete by: As directed    Call MD for:  persistant nausea and vomiting   Complete by: As directed    Call MD for:  temperature >100.4   Complete by: As directed    Diet - low sodium heart healthy   Complete by: As directed    Increase activity slowly   Complete by: As directed      Allergies as of  09/15/2019      Reactions   Gold Anaphylaxis   Swelling of lips/tongue, throat Swelling of lips/tongue, throat   Nsaids Other (See Comments)   Intolerance Intolerance   Sulfa Antibiotics Nausea Only   Sulfonamide Derivatives    Latex Other (See Comments)   blisters   Other Nausea Only   Sulfamethoxazole Nausea Only      Medication List    STOP taking these medications   cloNIDine 0.1 MG tablet Commonly known as: CATAPRES     TAKE these medications   acetaminophen 500 MG tablet Commonly known as: TYLENOL Take 500 mg by mouth every 6 (six) hours as needed for mild pain.   amLODipine 10 MG tablet Commonly known as: NORVASC Take 1 tablet (10 mg total) by mouth daily. Start taking on: Sep 16, 2019 What changed:   medication strength  how much to take   aspirin EC 81 MG tablet Take 81 mg by mouth daily.   B-D ULTRAFINE III SHORT PEN 31G X 8 MM Misc Generic drug: Insulin Pen Needle 1 each by Other route daily.   cephALEXin 500 MG capsule Commonly known as: KEFLEX Take 500 mg by mouth 2 (two) times daily.   diazepam 2 MG tablet Commonly known as: VALIUM Take 2 mg by mouth daily as needed for anxiety or muscle spasms.   fexofenadine 180 MG tablet Commonly known as: ALLEGRA Take 180 mg by mouth  daily.   FREESTYLE LITE test strip Generic drug: glucose blood 1 each by Other route daily.   gabapentin 100 MG capsule Commonly known as: NEURONTIN Take 200 mg by mouth 2 (two) times daily.   insulin NPH-regular Human (70-30) 100 UNIT/ML injection Inject 30-40 Units into the skin in the morning and at bedtime. Inject 40 units into the skin each morning and 30 units into the skin each evening.   levothyroxine 88 MCG tablet Commonly known as: SYNTHROID Take 88 mcg by mouth daily before breakfast.   lidocaine 5 % Commonly known as: LIDODERM Place 1 patch onto the skin daily.   Linzess 145 MCG Caps capsule Generic drug: linaclotide Take 145 mcg by mouth daily.    multivitamin tablet Take 1 tablet by mouth daily.   omeprazole 20 MG tablet Commonly known as: PriLOSEC OTC Take 1 tablet (20 mg total) by mouth daily.   oxyCODONE 10 mg 12 hr tablet Commonly known as: OXYCONTIN Take 10 mg by mouth every 12 (twelve) hours.   oxyCODONE 5 MG immediate release tablet Commonly known as: Oxy IR/ROXICODONE Take 5 mg by mouth 3 (three) times daily as needed for severe pain.   polyethylene glycol powder 17 GM/SCOOP powder Commonly known as: GLYCOLAX/MIRALAX Take 17 g by mouth as needed for mild constipation.   predniSONE 5 MG tablet Commonly known as: DELTASONE Take 5 mg by mouth 2 (two) times daily.   Restasis 0.05 % ophthalmic emulsion Generic drug: cycloSPORINE Place 1 drop into both eyes 2 (two) times daily.   SYSTANE BALANCE OP Place 1 drop into both eyes as needed (for dry eyes).   tiZANidine 2 MG tablet Commonly known as: ZANAFLEX Take 2 mg by mouth 2 (two) times daily as needed for muscle spasms.   torsemide 20 MG tablet Commonly known as: DEMADEX Take 3 tablets (60 mg total) by mouth daily.   Vitamin D3 50 MCG (2000 UT) Tabs Take 2,000 Units by mouth daily.            Durable Medical Equipment  (From admission, onward)         Start     Ordered   09/15/19 1345  For home use only DME oxygen  Once    Question Answer Comment  Length of Need 6 Months   Mode or (Route) Nasal cannula   Liters per Minute 2   Frequency Continuous (stationary and portable oxygen unit needed)   Oxygen conserving device Yes   Oxygen delivery system Gas      09/15/19 1345          Allergies  Allergen Reactions  . Gold Anaphylaxis    Swelling of lips/tongue, throat Swelling of lips/tongue, throat  . Nsaids Other (See Comments)    Intolerance Intolerance  . Sulfa Antibiotics Nausea Only  . Sulfonamide Derivatives   . Latex Other (See Comments)    blisters  . Other Nausea Only  . Sulfamethoxazole Nausea Only     Consultations:  none   Procedures/Studies: CT ABDOMEN PELVIS WO CONTRAST  Result Date: 08/21/2019 CLINICAL DATA:  Early satiety acute renal failure EXAM: CT ABDOMEN AND PELVIS WITHOUT CONTRAST TECHNIQUE: Multidetector CT imaging of the abdomen and pelvis was performed following the standard protocol without IV contrast. COMPARISON:  Ultrasound 08/21/2019, CT 07/30/2019 FINDINGS: Lower chest: Lung bases demonstrate trace left effusion. 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 Cardiac size within normal limits. Mitral calcification. Coronary calcification. Borderline  to mild cardiomegaly. Hepatobiliary: Status post cholecystectomy. No focal hepatic abnormality or biliary dilatation Pancreas: Unremarkable. No pancreatic ductal dilatation or surrounding inflammatory changes. Spleen: Normal in size without focal abnormality. Adrenals/Urinary Tract: Adrenal glands are normal. Kidneys show no hydronephrosis. Negative for stone disease. Bladder is normal Stomach/Bowel: Stomach is within normal limits. Appendix not well seen but no right lower quadrant inflammatory process. Patient has history of prior appendectomy. Large volume of stool in the colon. No evidence of bowel wall thickening, distention, or inflammatory changes. Vascular/Lymphatic: Extensive aortic atherosclerosis without aneurysm. No suspicious adenopathy. Reproductive: Uterus and bilateral adnexa are unremarkable. Other: Negative for free air or free fluid. Generalized subcutaneous edema. Musculoskeletal: Chronic compression deformities at L2, L3 and L4. IMPRESSION: 1. No CT evidence for acute intra-abdominal or pelvic abnormality. Negative for hydronephrosis or urinary tract calculus. 2. 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   On: 08/21/2019 20:29   DG Chest 2 View  Result Date:  09/10/2019 CLINICAL DATA:  Shortness of breath EXAM: CHEST - 2 VIEW COMPARISON:  08/21/2019 FINDINGS: Persistent elevation of the right hemidiaphragm presumably related to loculated pleural effusion seen on prior cross-sectional imaging. Persistent interstitial prominence. No pneumothorax. Stable cardiomediastinal contours. Advanced degenerative changes at the shoulders. IMPRESSION: No substantial change since 08/21/2019. Persistent elevation of the right hemidiaphragm probably related to loculated pleural effusion and interstitial prominence, which may reflect edema. Electronically Signed   By: Guadlupe Spanish M.D.   On: 09/10/2019 14:16   US RENAL  Result Date: 09/11/2019 CLINICAL DATA:  Acute on chronic renal failure EXAM: RENAL / URINARY TRACT ULTRASOUND COMPLETE COMPARISON:  08/21/2019 FINDINGS: Right Kidney: Renal measurements: 7.5 x 4.4 x 4.6 cm = volume: 79 mL. Stable renal cortical thinning. Mild increased renal cortical echotexture unchanged. Left Kidney: Renal measurements: 9.5 x 6.2 by 4.4 cm = volume: 133 mL. Stable renal cortical thinning. Mild increased renal cortical echotexture unchanged. Bladder: Bladder is not visualized due to bowel gas and incomplete distension. Other: None. IMPRESSION: 1. Stable bilateral renal cortical thinning. Increased renal cortical echotexture consistent with medical renal disease. Electronically Signed   By: Sharlet Salina M.D.   On: 09/11/2019 00:26   US Renal  Result Date: 08/21/2019 CLINICAL DATA:  Acute renal failure on chronic kidney disease EXAM: RENAL / URINARY TRACT ULTRASOUND COMPLETE COMPARISON:  CT 09/04/2018 FINDINGS: Right Kidney: Renal measurements: 8 x 5.1 x 4.6 cm = volume: 98.3 mL. Slightly echogenic cortex. No hydronephrosis. Suspected mild cortical thinning. Possible tiny cyst at the upper pole measuring 1 cm. Left Kidney: Renal measurements: 10.3 x 5.2 x 5.7 cm = volume: 159.1 mL. Slightly echogenic cortex. No hydronephrosis or mass. Borderline  to mild cortical thinning. Bladder: Limited visualization due to habitus.  Grossly normal. Other: None. IMPRESSION: 1. Kidneys appear slightly echogenic suggesting medical renal disease. No hydronephrosis. Probable mild cortical thinning/atrophy of both kidneys. Electronically Signed   By: Jasmine Pang M.D.   On: 08/21/2019 18:26   IR Fluoro Guide CV Line Right  Result Date: 08/22/2019 CLINICAL DATA:  Acute kidney injury, needs access for hemodialysis EXAM: EXAM RIGHT IJ CATHETER PLACEMENT UNDER ULTRASOUND AND FLUOROSCOPIC GUIDANCE TECHNIQUE: The procedure, risks (including but not limited to bleeding, infection, organ damage, pneumothorax), benefits, and alternatives were explained to the patient. Questions regarding the procedure were encouraged and answered. The patient understands and consents to the procedure. Patency of the right IJ vein was confirmed with ultrasound with image documentation. An appropriate skin  site was determined. Skin site was marked. Region was prepped using maximum barrier technique including cap and mask, sterile gown, sterile gloves, large sterile sheet, and Chlorhexidine as cutaneous antisepsis. The region was infiltrated locally with 1% lidocaine. Under real-time ultrasound guidance, the right IJ vein was accessed with a 21 gauge needle; the needle tip within the vein was confirmed with ultrasound image documentation. The needle exchanged over a 018 guidewire for vascular dilator which allowed advancement of a 16 cm Mahurkar catheter. This was positioned with the tip at the cavoatrial junction. Spot chest radiograph shows good positioning and no pneumothorax. Catheter was flushed and sutured externally with 0-Prolene sutures. Patient tolerated the procedure well. FLUOROSCOPY TIME:  Less than 6 seconds; 3.56 mGy COMPLICATIONS: COMPLICATIONS none IMPRESSION: 1. Technically successful right IJ Mahurkar catheter placement. Electronically Signed   By: Corlis Leak M.D.   On: 08/22/2019  15:51   IR US Guide Vasc Access Right  Result Date: 08/22/2019 CLINICAL DATA:  Acute kidney injury, needs access for hemodialysis EXAM: EXAM RIGHT IJ CATHETER PLACEMENT UNDER ULTRASOUND AND FLUOROSCOPIC GUIDANCE TECHNIQUE: The procedure, risks (including but not limited to bleeding, infection, organ damage, pneumothorax), benefits, and alternatives were explained to the patient. Questions regarding the procedure were encouraged and answered. The patient understands and consents to the procedure. Patency of the right IJ vein was confirmed with ultrasound with image documentation. An appropriate skin site was determined. Skin site was marked. Region was prepped using maximum barrier technique including cap and mask, sterile gown, sterile gloves, large sterile sheet, and Chlorhexidine as cutaneous antisepsis. The region was infiltrated locally with 1% lidocaine. Under real-time ultrasound guidance, the right IJ vein was accessed with a 21 gauge needle; the needle tip within the vein was confirmed with ultrasound image documentation. The needle exchanged over a 018 guidewire for vascular dilator which allowed advancement of a 16 cm Mahurkar catheter. This was positioned with the tip at the cavoatrial junction. Spot chest radiograph shows good positioning and no pneumothorax. Catheter was flushed and sutured externally with 0-Prolene sutures. Patient tolerated the procedure well. FLUOROSCOPY TIME:  Less than 6 seconds; 3.56 mGy COMPLICATIONS: COMPLICATIONS none IMPRESSION: 1. Technically successful right IJ Mahurkar catheter placement. Electronically Signed   By: Corlis Leak M.D.   On: 08/22/2019 15:51   DG CHEST PORT 1 VIEW  Result Date: 08/21/2019 CLINICAL DATA:  Missed dialysis. Clinical concern for fluid overload. EXAM: PORTABLE CHEST 1 VIEW COMPARISON:  08/02/2019 FINDINGS: Stable poor inspiration and borderline enlarged cardiac silhouette. Decreased prominence of the pulmonary vasculature. Mild increase in  prominence of the interstitial markings, including some Kerley lines. Small right pleural effusion, decreased. Bilateral shoulder degenerative changes and superior migration of the humeral heads, compatible with large, chronic bilateral rotator cuff tears. IMPRESSION: Mild changes of congestive heart failure with interval mild interstitial pulmonary edema. Electronically Signed   By: Beckie Salts M.D.   On: 08/21/2019 18:59   VAS [place] LOWER EXTREMITY VENOUS (DVT)  Result Date: 09/11/2019  Lower Venous DVTStudy Indications: Edema.  Limitations: Body habitus and poor ultrasound/tissue interface. Comparison Study: 09/26/2010- negative lower extremity venous duplex Performing Technologist: Gertie Fey MHA, RDMS, RVT, RDCS  Examination Guidelines: A complete evaluation includes B-mode imaging, spectral Doppler, color Doppler, and power Doppler as needed of all accessible portions of each vessel. Bilateral testing is considered an integral part of a complete examination. Limited examinations for reoccurring indications may be performed as noted. The reflux portion of the exam is performed with the patient in  reverse Trendelenburg.  +---------+---------------+---------+-----------+----------+--------------+ RIGHT    CompressibilityPhasicitySpontaneityPropertiesThrombus Aging +---------+---------------+---------+-----------+----------+--------------+ CFV      Full           Yes      Yes                                 +---------+---------------+---------+-----------+----------+--------------+ SFJ      Full                                                        +---------+---------------+---------+-----------+----------+--------------+ FV Prox  Full                                                        +---------+---------------+---------+-----------+----------+--------------+ FV Mid   Full                                                         +---------+---------------+---------+-----------+----------+--------------+ FV Distal                        Yes                                 +---------+---------------+---------+-----------+----------+--------------+ PFV      Full                                                        +---------+---------------+---------+-----------+----------+--------------+ POP      Full           Yes      Yes                                 +---------+---------------+---------+-----------+----------+--------------+ PTV      Full                                                        +---------+---------------+---------+-----------+----------+--------------+ PERO     Full                                                        +---------+---------------+---------+-----------+----------+--------------+   +-------+---------------+---------+-----------+----------+--------------+ LEFT   CompressibilityPhasicitySpontaneityPropertiesThrombus Aging +-------+---------------+---------+-----------+----------+--------------+ CFV    Full           Yes      Yes                                 +-------+---------------+---------+-----------+----------+--------------+  SFJ    Full                                                        +-------+---------------+---------+-----------+----------+--------------+ FV ProxFull                                                        +-------+---------------+---------+-----------+----------+--------------+ FV Mid Full                                                        +-------+---------------+---------+-----------+----------+--------------+ PFV    Full                                                        +-------+---------------+---------+-----------+----------+--------------+ POP    Full           Yes      Yes                                 +-------+---------------+---------+-----------+----------+--------------+  PTV    Full                                                        +-------+---------------+---------+-----------+----------+--------------+   Left Technical Findings: Not visualized segments include distal left femoral vein, peroneal veins.   Summary: RIGHT: - There is no evidence of deep vein thrombosis in the lower extremity. However, portions of this examination were limited- see technologist comments above.  - No cystic structure found in the popliteal fossa.  LEFT: - There is no evidence of deep vein thrombosis in the lower extremity. However, portions of this examination were limited- see technologist comments above.  - No cystic structure found in the popliteal fossa.  *See table(s) above for measurements and observations. Electronically signed by Coral Else MD on 09/11/2019 at 9:46:34 PM.    Final    VAS US RENAL ARTERY DUPLEX  Result Date: 08/21/2019 ABDOMINAL VISCERAL Indications: Uncontrolled hypertension. Patient indicates she is not urinating              very much these last 2 days. She is having upper abdominal pain and              feels very weak. She is having swelling throughout her entire body. High Risk Factors: Hypertension, Diabetes, no history of smoking, coronary                    artery disease. Other Factors: CKD stage 3                 Most recent blood work 07/24/2019  BUN 98                Creatinine 1.86. Limitations: Air/bowel gas, obesity and patient discomfort. Comparison Study: None Performing Technologist: Alecia Mackin RVT, RDCS (AE), RDMS  Examination Guidelines: A complete evaluation includes B-mode imaging, spectral Doppler, color Doppler, and power Doppler as needed of all accessible portions of each vessel. Bilateral testing is considered an integral part of a complete examination. Limited examinations for reoccurring indications may be performed as noted.  Duplex Findings: +----------------------+--------+--------+--------+--------+ Mesenteric             PSV cm/sEDV cm/s Plaque Comments +----------------------+--------+--------+--------+--------+ Aorta Prox               74           calcific         +----------------------+--------+--------+--------+--------+ Aorta Distal            100           calcific         +----------------------+--------+--------+--------+--------+ Celiac Artery Origin    114      15                    +----------------------+--------+--------+--------+--------+ Celiac Artery Proximal  139      10                    +----------------------+--------+--------+--------+--------+ SMA Proximal            209      26                    +----------------------+--------+--------+--------+--------+  +------------------+--------+--------+-------------------------------+ Right Renal ArteryPSV cm/sEDV cm/s            Comment             +------------------+--------+--------+-------------------------------+ Origin                            not seen due to overlying bowel +------------------+--------+--------+-------------------------------+ Proximal                                     not seen             +------------------+--------+--------+-------------------------------+ Mid                 161      14                                   +------------------+--------+--------+-------------------------------+ Distal               48      1                                    +------------------+--------+--------+-------------------------------+ +-----------------+--------+--------+-------+ Left Renal ArteryPSV cm/sEDV cm/sComment +-----------------+--------+--------+-------+ Origin              97      19           +-----------------+--------+--------+-------+ Proximal            88      21           +-----------------+--------+--------+-------+ Mid  80      14           +-----------------+--------+--------+-------+ Distal              57      11            +-----------------+--------+--------+-------+  Technologist observations: Technically limited exam due to overlying bowel. +------------+--------+--------+----+-----------+--------+--------+----+ Right KidneyPSV cm/sEDV cm/sRI  Left KidneyPSV cm/sEDV cm/sRI   +------------+--------+--------+----+-----------+--------+--------+----+ Upper Pole  29      0       1.00Upper Pole 19      0       0.98 +------------+--------+--------+----+-----------+--------+--------+----+ Mid         18      0       1.        19      1       0.97 +------------+--------+--------+----+-----------+--------+--------+----+ Lower Pole  22      0       0.99Lower Pole 16      0       0.99 +------------+--------+--------+----+-----------+--------+--------+----+ Hilar       36      0       0.99Hilar      26      0       1.00 +------------+--------+--------+----+-----------+--------+--------+----+ +------------------+--------+------------------+--------+ Right Kidney              Left Kidney                +------------------+--------+------------------+--------+ RAR                       RAR                        +------------------+--------+------------------+--------+ RAR (manual)      2.1     RAR (manual)      1.3      +------------------+--------+------------------+--------+ Cortex                    Cortex                     +------------------+--------+------------------+--------+ Cortex thickness  12.00 mmCorex thickness   11.00 mm +------------------+--------+------------------+--------+ Kidney length (cm)9.40    Kidney length (cm)10.60    +------------------+--------+------------------+--------+  Findings reported to Dr. Duke Salvia at 12:00 pm . Summary: Largest Aortic Diameter: 2.2 cm  Renal:  Right: Normal size right kidney. Abnormal right Resistive Index.        Normal cortical thickness of right kidney. RRV flow present.        From what was seen  there is no evidence of renal artery        stenosis. Left:  Normal size of left kidney. Abnormal left Resisitve Index.        Normal cortical thickness of the left kidney. LRV flow        present. No evidence of left renal artery stenosis. Mesenteric: Normal Celiac artery and Superior Mesenteric artery findings. Areas of limited visceral study include right renal artery. Moderate aortic wall calcification. There is no evidence of diastolic flow seen in the segtmental arterial dopplers in both kidneys.  *See table(s) above for measurements and observations.  Diagnosing physician: Julien Nordmann MD  Electronically signed by Julien Nordmann MD on 08/21/2019 at 6:02:21 PM.    Final        Subjective:   Discharge Exam: Vitals:   09/15/19 0900 09/15/19 1135  BP:  (!) 153/57  Pulse:  81  Resp:  18  Temp:  98.6 F (37 C)  SpO2: 93% (!) 89%   Vitals:   09/15/19 0509 09/15/19 0617 09/15/19 0900 09/15/19 1135  BP: (!) 159/104 (!) 155/92  (!) 153/57  Pulse: 93 74  81  Resp: 18   18  Temp: 98.2 F (36.8 C)   98.6 F (37 C)  TempSrc: Oral   Oral  SpO2: 96%  93% (!) 89%  Weight: 74.5 kg     Height:        General: Pt is alert, awake, not in acute distress Cardiovascular: RRR, S1/S2 +, no rubs, no gallops Respiratory: CTA bilaterally, no wheezing, no rhonchi Abdominal: Soft, NT, ND, bowel sounds + Extremities: no edema, no cyanosis    The results of significant diagnostics from this hospitalization (including imaging, microbiology, ancillary and laboratory) are listed below for reference.     Microbiology: Recent Results (from the past 240 hour(s))  SARS Coronavirus 2 by RT PCR (hospital order, performed in [facility] hospital lab) Nasopharyngeal Nasopharyngeal Swab     Status: None   Collection Time: 09/10/19 11:10 PM   Specimen: Nasopharyngeal Swab  Result Value Ref Range Status   SARS Coronavirus 2 NEGATIVE NEGATIVE Final    Comment: (NOTE) SARS-CoV-2 target nucleic acids are  NOT DETECTED. The SARS-CoV-2 RNA is generally detectable in upper and lower respiratory specimens during the acute phase of infection. The lowest concentration of SARS-CoV-2 viral copies this assay can detect is 250 copies / mL. A negative result does not preclude SARS-CoV-2 infection and should not be used as the sole basis for treatment or other patient management decisions.  A negative result may occur with improper specimen collection / handling, submission of specimen other than nasopharyngeal swab, presence of viral mutation(s) within the areas targeted by this assay, and inadequate number of viral copies (<250 copies / mL). A negative result must be combined with clinical observations, patient history, and epidemiological information. Fact Sheet for Patients:   BoilerBrush.com.cy Fact Sheet for Healthcare Providers: https://pope.com/ This test is not yet approved or cleared  by the [place] FDA and has been authorized for detection and/or diagnosis of SARS-CoV-2 by FDA under an Emergency Use Authorization (EUA).  This EUA will remain in effect (meaning this test can be used) for the duration of the COVID-19 declaration under Section 564(b)(1) of the Act, 21 U.S.C. section 360bbb-3(b)(1), unless the authorization is terminated or revoked sooner. Performed at [facility], [address] [place], [place] [address]   Urine culture     Status: Abnormal   Collection Time: 09/11/19  2:55 PM   Specimen: Urine, Random  Result Value Ref Range Status   Specimen Description URINE, RANDOM  Final   Special Requests NONE  Final   Culture (A)  Final    <10,000 COLONIES/mL INSIGNIFICANT GROWTH Performed at [facility], [address], [place], [place] [address]    Report Status 09/12/2019 FINAL  Final     Labs: BNP (last 3 results) Recent Labs    08/21/19 1830 09/10/19 2013  BNP 224.5* 507.9*   Basic Metabolic  Panel: Recent Labs  Lab 09/10/19 1347 09/10/19 2013 09/11/19 0312 09/12/19 0754 09/13/19 0737 09/14/19 0620 09/15/19 0629  NA   < >  --  140 140 140 139 141  K   < >  --  4.0 4.4 4.3 4.3 4.0  CL   < >  --  97* 98 97* 94* 94*  CO2   < >  --  30 30 31 30  36*  GLUCOSE   < >  --  121* 252* 359* 332* 333*  BUN   < >  --  78* 72* 65* 59* 52*  CREATININE   < >  --  2.50* 1.81* 1.60* 1.31* 1.31*  CALCIUM   < >  --  9.1 9.4 9.7 9.7 9.6  PHOS  --  4.7*  --   --   --   --   --    < > = values in this interval not displayed.   Liver Function Tests: Recent Labs  Lab 09/11/19 0312 09/12/19 0754 09/13/19 0737 09/14/19 0620 09/15/19 0629  AST 26 25 22 21 16   ALT 26 25 26 23 19   ALKPHOS 99 139* 136* 119 112  BILITOT 1.1 0.6 1.2 1.3* 0.7  PROT 5.8* 6.2* 6.5 6.4* 6.4*  ALBUMIN 2.7* 2.9* 3.0* 2.9* 3.0*   No results for input(s): LIPASE, AMYLASE in the last 168 hours. No results for input(s): AMMONIA in the last 168 hours. CBC: Recent Labs  Lab 09/11/19 0312 09/12/19 0754 09/13/19 0737 09/14/19 0620 09/15/19 0629  WBC 6.7 7.1 7.7 7.8 7.2  HGB 10.2* 10.6* 10.8* 10.7* 11.2*  HCT 32.4* 33.6* 34.3* 33.3* 35.4*  MCV 107.3* 103.7* 102.7* 102.5* 102.0*  PLT 176 197 214 199 197   Cardiac Enzymes: No results for input(s): CKTOTAL, CKMB, CKMBINDEX, TROPONINI in the last 168 hours. BNP: Invalid input(s): POCBNP CBG: Recent Labs  Lab 09/14/19 1624 09/14/19 2119 09/15/19 0003 09/15/19 0606 09/15/19 1131  GLUCAP 370* 441* 358* 284* 304*   D-Dimer No results for input(s): DDIMER in the last 72 hours. Hgb A1c No results for input(s): HGBA1C in the last 72 hours. Lipid Profile No results for input(s): CHOL, HDL, LDLCALC, TRIG, CHOLHDL, LDLDIRECT in the last 72 hours. Thyroid function studies No results for input(s): TSH, T4TOTAL, T3FREE, THYROIDAB in the last 72 hours.  Invalid input(s): FREET3 Anemia work up No results for input(s): VITAMINB12, FOLATE, FERRITIN, TIBC, IRON,  RETICCTPCT in the last 72 hours. Urinalysis    Component Value Date/Time   COLORURINE YELLOW 09/11/2019 1500   APPEARANCEUR HAZY (A) 09/11/2019 1500   LABSPEC 1.011 09/11/2019 1500   PHURINE 5.0 09/11/2019 1500   GLUCOSEU 50 (A) 09/11/2019 1500   HGBUR NEGATIVE 09/11/2019 1500   BILIRUBINUR NEGATIVE 09/11/2019 1500   KETONESUR NEGATIVE 09/11/2019 1500   PROTEINUR NEGATIVE 09/11/2019 1500   UROBILINOGEN 1.0 12/18/2008 1232   NITRITE NEGATIVE 09/11/2019 1500   LEUKOCYTESUR SMALL (A) 09/11/2019 1500   Sepsis Labs Invalid input(s): PROCALCITONIN,  WBC,  LACTICIDVEN Microbiology Recent Results (from the past 240 hour(s))  SARS Coronavirus 2 by RT PCR (hospital order, performed in [facility] hospital lab) Nasopharyngeal Nasopharyngeal Swab     Status: None   Collection Time: 09/10/19 11:10 PM   Specimen: Nasopharyngeal Swab  Result Value Ref Range Status   SARS Coronavirus 2 NEGATIVE NEGATIVE Final    Comment: (NOTE) SARS-CoV-2 target nucleic acids are NOT DETECTED. The SARS-CoV-2 RNA is generally detectable in upper and lower respiratory specimens during the acute phase of infection. The lowest concentration of SARS-CoV-2 viral copies this assay can detect is 250 copies / mL. A negative result does not preclude SARS-CoV-2 infection and should not be used as the sole basis for treatment or other patient management decisions.  A negative result may occur with improper specimen collection / handling, submission of  specimen other than nasopharyngeal swab, presence of viral mutation(s) within the areas targeted by this assay, and inadequate number of viral copies (<250 copies / mL). A negative result must be combined with clinical observations, patient history, and epidemiological information. Fact Sheet for Patients:   BoilerBrush.com.cy Fact Sheet for Healthcare Providers: https://pope.com/ This test is not yet approved or cleared  by  the [place] FDA and has been authorized for detection and/or diagnosis of SARS-CoV-2 by FDA under an Emergency Use Authorization (EUA).  This EUA will remain in effect (meaning this test can be used) for the duration of the COVID-19 declaration under Section 564(b)(1) of the Act, 21 U.S.C. section 360bbb-3(b)(1), unless the authorization is terminated or revoked sooner. Performed at [facility], [address], [place], [place] [address]   Urine culture     Status: Abnormal   Collection Time: 09/11/19  2:55 PM   Specimen: Urine, Random  Result Value Ref Range Status   Specimen Description URINE, RANDOM  Final   Special Requests NONE  Final   Culture (A)  Final    <10,000 COLONIES/mL INSIGNIFICANT GROWTH Performed at [facility], [address] [place], [place] [address]    Report Status 09/12/2019 FINAL  Final     Time coordinating discharge: Over 30 minutes  SIGNED:   Burke Keels, MD  [facility] 09/15/2019, 1:54 PM Pager   If 7PM-7AM, please contact night-coverage [email or web address] Password [email or web address]

## 2019-09-15 NOTE — Progress Notes (Signed)
SATURATION QUALIFICATIONS: (This note is used to comply with regulatory documentation for home oxygen)  Patient Saturations on Room Air at Rest = 86%    Patient Saturations on 2 Liters of oxygen while Ambulating = 92%  Please briefly explain why patient needs home oxygen:  Pts saturations at rest were 86%.

## 2019-09-19 ENCOUNTER — Other Ambulatory Visit: Payer: Self-pay

## 2019-09-19 ENCOUNTER — Ambulatory Visit (INDEPENDENT_AMBULATORY_CARE_PROVIDER_SITE_OTHER): Payer: Medicare Other | Admitting: Sports Medicine

## 2019-09-19 ENCOUNTER — Encounter: Payer: Self-pay | Admitting: Sports Medicine

## 2019-09-19 DIAGNOSIS — M79674 Pain in right toe(s): Secondary | ICD-10-CM

## 2019-09-19 DIAGNOSIS — I739 Peripheral vascular disease, unspecified: Secondary | ICD-10-CM | POA: Diagnosis not present

## 2019-09-19 DIAGNOSIS — R600 Localized edema: Secondary | ICD-10-CM

## 2019-09-19 DIAGNOSIS — B351 Tinea unguium: Secondary | ICD-10-CM | POA: Diagnosis not present

## 2019-09-19 DIAGNOSIS — L84 Corns and callosities: Secondary | ICD-10-CM

## 2019-09-19 DIAGNOSIS — M79675 Pain in left toe(s): Secondary | ICD-10-CM

## 2019-09-19 DIAGNOSIS — E1142 Type 2 diabetes mellitus with diabetic polyneuropathy: Secondary | ICD-10-CM
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
## 2019-09-28 ENCOUNTER — Other Ambulatory Visit: Payer: Self-pay

## 2019-09-28 ENCOUNTER — Inpatient Hospital Stay (HOSPITAL_COMMUNITY)
Admission: EM | Admit: 2019-09-28 | Discharge: 2019-11-01 | DRG: 291 | Disposition: E | Payer: Medicare Other | Attending: Student | Admitting: Student

## 2019-09-28 ENCOUNTER — Emergency Department (HOSPITAL_COMMUNITY): Payer: Medicare Other

## 2019-09-28 ENCOUNTER — Encounter (HOSPITAL_COMMUNITY): Payer: Self-pay | Admitting: Emergency Medicine

## 2019-09-28 DIAGNOSIS — J9600 Acute respiratory failure, unspecified whether with hypoxia or hypercapnia: Secondary | ICD-10-CM | POA: Diagnosis not present

## 2019-09-28 DIAGNOSIS — G9341 Metabolic encephalopathy: Secondary | ICD-10-CM | POA: Diagnosis present

## 2019-09-28 DIAGNOSIS — R109 Unspecified abdominal pain: Secondary | ICD-10-CM

## 2019-09-28 DIAGNOSIS — Z79899 Other long term (current) drug therapy: Secondary | ICD-10-CM

## 2019-09-28 DIAGNOSIS — Z20822 Contact with and (suspected) exposure to covid-19: Secondary | ICD-10-CM | POA: Diagnosis present

## 2019-09-28 DIAGNOSIS — Z66 Do not resuscitate: Secondary | ICD-10-CM | POA: Diagnosis not present

## 2019-09-28 DIAGNOSIS — Z6836 Body mass index (BMI) 36.0-36.9, adult: Secondary | ICD-10-CM

## 2019-09-28 DIAGNOSIS — I272 Pulmonary hypertension, unspecified: Secondary | ICD-10-CM | POA: Diagnosis present

## 2019-09-28 DIAGNOSIS — Z7952 Long term (current) use of systemic steroids: Secondary | ICD-10-CM | POA: Diagnosis not present

## 2019-09-28 DIAGNOSIS — E119 Type 2 diabetes mellitus without complications: Secondary | ICD-10-CM | POA: Diagnosis not present

## 2019-09-28 DIAGNOSIS — E1122 Type 2 diabetes mellitus with diabetic chronic kidney disease: Secondary | ICD-10-CM | POA: Diagnosis present

## 2019-09-28 DIAGNOSIS — Z7901 Long term (current) use of anticoagulants: Secondary | ICD-10-CM

## 2019-09-28 DIAGNOSIS — Z79890 Hormone replacement therapy (postmenopausal): Secondary | ICD-10-CM | POA: Diagnosis not present

## 2019-09-28 DIAGNOSIS — Z515 Encounter for palliative care: Secondary | ICD-10-CM | POA: Diagnosis not present

## 2019-09-28 DIAGNOSIS — Z8673 Personal history of transient ischemic attack (TIA), and cerebral infarction without residual deficits: Secondary | ICD-10-CM

## 2019-09-28 DIAGNOSIS — E1142 Type 2 diabetes mellitus with diabetic polyneuropathy: Secondary | ICD-10-CM | POA: Diagnosis present

## 2019-09-28 DIAGNOSIS — F419 Anxiety disorder, unspecified: Secondary | ICD-10-CM | POA: Diagnosis present

## 2019-09-28 DIAGNOSIS — N1832 Chronic kidney disease, stage 3b: Secondary | ICD-10-CM | POA: Diagnosis not present

## 2019-09-28 DIAGNOSIS — Z794 Long term (current) use of insulin: Secondary | ICD-10-CM

## 2019-09-28 DIAGNOSIS — J9622 Acute and chronic respiratory failure with hypercapnia: Secondary | ICD-10-CM | POA: Diagnosis present

## 2019-09-28 DIAGNOSIS — I5033 Acute on chronic diastolic (congestive) heart failure: Secondary | ICD-10-CM | POA: Diagnosis present

## 2019-09-28 DIAGNOSIS — D631 Anemia in chronic kidney disease: Secondary | ICD-10-CM | POA: Diagnosis present

## 2019-09-28 DIAGNOSIS — E039 Hypothyroidism, unspecified: Secondary | ICD-10-CM | POA: Diagnosis present

## 2019-09-28 DIAGNOSIS — N390 Urinary tract infection, site not specified: Secondary | ICD-10-CM | POA: Diagnosis not present

## 2019-09-28 DIAGNOSIS — I2510 Atherosclerotic heart disease of native coronary artery without angina pectoris: Secondary | ICD-10-CM | POA: Diagnosis present

## 2019-09-28 DIAGNOSIS — J9601 Acute respiratory failure with hypoxia: Secondary | ICD-10-CM | POA: Diagnosis not present

## 2019-09-28 DIAGNOSIS — I509 Heart failure, unspecified: Secondary | ICD-10-CM

## 2019-09-28 DIAGNOSIS — Z7982 Long term (current) use of aspirin: Secondary | ICD-10-CM

## 2019-09-28 DIAGNOSIS — J811 Chronic pulmonary edema: Secondary | ICD-10-CM | POA: Diagnosis present

## 2019-09-28 DIAGNOSIS — K219 Gastro-esophageal reflux disease without esophagitis: Secondary | ICD-10-CM | POA: Diagnosis present

## 2019-09-28 DIAGNOSIS — I878 Other specified disorders of veins: Secondary | ICD-10-CM | POA: Diagnosis present

## 2019-09-28 DIAGNOSIS — E1165 Type 2 diabetes mellitus with hyperglycemia: Secondary | ICD-10-CM | POA: Diagnosis present

## 2019-09-28 DIAGNOSIS — R0902 Hypoxemia: Secondary | ICD-10-CM

## 2019-09-28 DIAGNOSIS — I130 Hypertensive heart and chronic kidney disease with heart failure and stage 1 through stage 4 chronic kidney disease, or unspecified chronic kidney disease: Principal | ICD-10-CM | POA: Diagnosis present

## 2019-09-28 DIAGNOSIS — I480 Paroxysmal atrial fibrillation: Secondary | ICD-10-CM | POA: Diagnosis present

## 2019-09-28 DIAGNOSIS — N179 Acute kidney failure, unspecified: Secondary | ICD-10-CM | POA: Diagnosis present

## 2019-09-28 DIAGNOSIS — E669 Obesity, unspecified: Secondary | ICD-10-CM | POA: Diagnosis present

## 2019-09-28 DIAGNOSIS — J9621 Acute and chronic respiratory failure with hypoxia: Secondary | ICD-10-CM | POA: Diagnosis present

## 2019-09-28 DIAGNOSIS — Z79891 Long term (current) use of opiate analgesic: Secondary | ICD-10-CM | POA: Diagnosis not present

## 2019-09-28 DIAGNOSIS — Z452 Encounter for adjustment and management of vascular access device: Secondary | ICD-10-CM

## 2019-09-28 DIAGNOSIS — N183 Chronic kidney disease, stage 3 unspecified: Secondary | ICD-10-CM | POA: Diagnosis present

## 2019-09-28 DIAGNOSIS — I441 Atrioventricular block, second degree: Secondary | ICD-10-CM | POA: Diagnosis present

## 2019-09-28 DIAGNOSIS — E785 Hyperlipidemia, unspecified: Secondary | ICD-10-CM | POA: Diagnosis present

## 2019-09-28 DIAGNOSIS — J9602 Acute respiratory failure with hypercapnia: Secondary | ICD-10-CM | POA: Diagnosis present

## 2019-09-28 DIAGNOSIS — E872 Acidosis: Secondary | ICD-10-CM | POA: Diagnosis present

## 2019-09-28 DIAGNOSIS — M0579 Rheumatoid arthritis with rheumatoid factor of multiple sites without organ or systems involvement: Secondary | ICD-10-CM | POA: Diagnosis not present

## 2019-09-28 DIAGNOSIS — Z789 Other specified health status: Secondary | ICD-10-CM | POA: Diagnosis not present

## 2019-09-28 DIAGNOSIS — Z7189 Other specified counseling: Secondary | ICD-10-CM | POA: Diagnosis not present

## 2019-09-28 DIAGNOSIS — M3500 Sicca syndrome, unspecified: Secondary | ICD-10-CM | POA: Diagnosis present

## 2019-09-28 DIAGNOSIS — I10 Essential (primary) hypertension: Secondary | ICD-10-CM | POA: Diagnosis present

## 2019-09-28 DIAGNOSIS — M069 Rheumatoid arthritis, unspecified: Secondary | ICD-10-CM | POA: Diagnosis present

## 2019-09-28 DIAGNOSIS — K5900 Constipation, unspecified: Secondary | ICD-10-CM | POA: Diagnosis present

## 2019-09-28 DIAGNOSIS — L89151 Pressure ulcer of sacral region, stage 1: Secondary | ICD-10-CM | POA: Diagnosis present

## 2019-09-28 DIAGNOSIS — I5031 Acute diastolic (congestive) heart failure: Secondary | ICD-10-CM | POA: Diagnosis not present

## 2019-09-28 LAB — CBC
HCT: 32.2 % — ABNORMAL LOW (ref 36.0–46.0)
Hemoglobin: 9.9 g/dL — ABNORMAL LOW (ref 12.0–15.0)
MCH: 31.8 pg (ref 26.0–34.0)
MCHC: 30.7 g/dL (ref 30.0–36.0)
MCV: 103.5 fL — ABNORMAL HIGH (ref 80.0–100.0)
Platelets: 266 10*3/uL (ref 150–400)
RBC: 3.11 MIL/uL — ABNORMAL LOW (ref 3.87–5.11)
RDW: 14.1 % (ref 11.5–15.5)
WBC: 9.1 10*3/uL (ref 4.0–10.5)
nRBC: 0.5 % — ABNORMAL HIGH (ref 0.0–0.2)

## 2019-09-28 LAB — BASIC METABOLIC PANEL
Anion gap: 16 — ABNORMAL HIGH (ref 5–15)
BUN: 73 mg/dL — ABNORMAL HIGH (ref 8–23)
CO2: 33 mmol/L — ABNORMAL HIGH (ref 22–32)
Calcium: 9.6 mg/dL (ref 8.9–10.3)
Chloride: 92 mmol/L — ABNORMAL LOW (ref 98–111)
Creatinine, Ser: 1.61 mg/dL — ABNORMAL HIGH (ref 0.44–1.00)
GFR calc Af Amer: 35 mL/min — ABNORMAL LOW (ref >60)
GFR calc non Af Amer: 31 mL/min — ABNORMAL LOW (ref >60)
Glucose, Bld: 121 mg/dL — ABNORMAL HIGH (ref 70–99)
Potassium: 3.7 mmol/L (ref 3.5–5.1)
Sodium: 141 mmol/L (ref 135–145)

## 2019-09-28 LAB — CBG MONITORING, ED
Glucose-Capillary: 65 mg/dL — ABNORMAL LOW (ref 70–99)
Glucose-Capillary: 78 mg/dL (ref 70–99)

## 2019-09-28 LAB — POC OCCULT BLOOD, ED: Fecal Occult Bld: NEGATIVE

## 2019-09-28 LAB — BRAIN NATRIURETIC PEPTIDE: B Natriuretic Peptide: 324.2 pg/mL — ABNORMAL HIGH (ref 0.0–100.0)

## 2019-09-28 MED ORDER — OXYCODONE HCL ER 10 MG PO T12A
10.0000 mg | EXTENDED_RELEASE_TABLET | Freq: Two times a day (BID) | ORAL | Status: DC
  Administered 2019-09-28 – 2019-10-01 (×6): 10 mg via ORAL
  Filled 2019-09-28 (×6): qty 1

## 2019-09-28 MED ORDER — ACETAMINOPHEN 325 MG PO TABS
650.0000 mg | ORAL_TABLET | Freq: Once | ORAL | Status: AC
  Administered 2019-09-28: 650 mg via ORAL
  Filled 2019-09-28: qty 2

## 2019-09-28 MED ORDER — SODIUM CHLORIDE 0.9% FLUSH
3.0000 mL | Freq: Once | INTRAVENOUS | Status: AC
  Administered 2019-09-29: 3 mL via INTRAVENOUS

## 2019-09-28 MED ORDER — FUROSEMIDE 10 MG/ML IJ SOLN
40.0000 mg | Freq: Once | INTRAMUSCULAR | Status: AC
  Administered 2019-09-28: 40 mg via INTRAVENOUS
  Filled 2019-09-28: qty 4
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
## 2019-09-28 NOTE — ED Triage Notes (Signed)
Pt c/o increased shortness of breath and decreased urine output. Hx CHF.

## 2019-09-29 ENCOUNTER — Encounter (HOSPITAL_COMMUNITY): Payer: Self-pay | Admitting: Internal Medicine

## 2019-09-29 DIAGNOSIS — Z794 Long term (current) use of insulin: Secondary | ICD-10-CM

## 2019-09-29 DIAGNOSIS — E119 Type 2 diabetes mellitus without complications: Secondary | ICD-10-CM

## 2019-09-29 LAB — GLUCOSE, CAPILLARY
Glucose-Capillary: 48 mg/dL — ABNORMAL LOW (ref 70–99)
Glucose-Capillary: 65 mg/dL — ABNORMAL LOW (ref 70–99)
Glucose-Capillary: 114 mg/dL — ABNORMAL HIGH (ref 70–99)
Glucose-Capillary: 222 mg/dL — ABNORMAL HIGH (ref 70–99)
Glucose-Capillary: 272 mg/dL — ABNORMAL HIGH (ref 70–99)
Glucose-Capillary: 333 mg/dL — ABNORMAL HIGH (ref 70–99)

## 2019-09-29 LAB — CBC
HCT: 31 % — ABNORMAL LOW (ref 36.0–46.0)
Hemoglobin: 9.3 g/dL — ABNORMAL LOW (ref 12.0–15.0)
MCH: 31.2 pg (ref 26.0–34.0)
MCHC: 30 g/dL (ref 30.0–36.0)
MCV: 104 fL — ABNORMAL HIGH (ref 80.0–100.0)
Platelets: 223 10*3/uL (ref 150–400)
RBC: 2.98 MIL/uL — ABNORMAL LOW (ref 3.87–5.11)
RDW: 14.1 % (ref 11.5–15.5)
WBC: 7.4 10*3/uL (ref 4.0–10.5)
nRBC: 0.8 % — ABNORMAL HIGH (ref 0.0–0.2)

## 2019-09-29 LAB — MAGNESIUM: Magnesium: 2.2 mg/dL (ref 1.7–2.4)

## 2019-09-29 LAB — DIFFERENTIAL
Abs Immature Granulocytes: 0.03 10*3/uL (ref 0.00–0.07)
Basophils Absolute: 0 10*3/uL (ref 0.0–0.1)
Basophils Relative: 0 %
Eosinophils Absolute: 0.2 10*3/uL (ref 0.0–0.5)
Eosinophils Relative: 2 %
Immature Granulocytes: 0 %
Lymphocytes Relative: 9 %
Lymphs Abs: 0.7 10*3/uL (ref 0.7–4.0)
Monocytes Absolute: 0.7 10*3/uL (ref 0.1–1.0)
Monocytes Relative: 9 %
Neutro Abs: 5.8 10*3/uL (ref 1.7–7.7)
Neutrophils Relative %: 80 %

## 2019-09-29 LAB — COMPREHENSIVE METABOLIC PANEL
ALT: 38 U/L (ref 0–44)
AST: 41 U/L (ref 15–41)
Albumin: 2.8 g/dL — ABNORMAL LOW (ref 3.5–5.0)
Alkaline Phosphatase: 224 U/L — ABNORMAL HIGH (ref 38–126)
Anion gap: 14 (ref 5–15)
BUN: 68 mg/dL — ABNORMAL HIGH (ref 8–23)
CO2: 35 mmol/L — ABNORMAL HIGH (ref 22–32)
Calcium: 9.1 mg/dL (ref 8.9–10.3)
Chloride: 92 mmol/L — ABNORMAL LOW (ref 98–111)
Creatinine, Ser: 1.55 mg/dL — ABNORMAL HIGH (ref 0.44–1.00)
GFR calc Af Amer: 37 mL/min — ABNORMAL LOW (ref >60)
GFR calc non Af Amer: 32 mL/min — ABNORMAL LOW (ref >60)
Glucose, Bld: 167 mg/dL — ABNORMAL HIGH (ref 70–99)
Potassium: 3.3 mmol/L — ABNORMAL LOW (ref 3.5–5.1)
Sodium: 141 mmol/L (ref 135–145)
Total Bilirubin: 0.8 mg/dL (ref 0.3–1.2)
Total Protein: 6 g/dL — ABNORMAL LOW (ref 6.5–8.1)

## 2019-09-29 LAB — SARS CORONAVIRUS 2 BY RT PCR (HOSPITAL ORDER, PERFORMED IN ~~LOC~~ HOSPITAL LAB): SARS Coronavirus 2: NEGATIVE

## 2019-09-29 MED ORDER — PREDNISONE 5 MG PO TABS
5.0000 mg | ORAL_TABLET | Freq: Every day | ORAL | Status: DC
  Administered 2019-09-29 – 2019-10-03 (×5): 5 mg via ORAL
  Filled 2019-09-29 (×5): qty 1

## 2019-09-29 MED ORDER — FUROSEMIDE 10 MG/ML IJ SOLN
100.0000 mg | Freq: Two times a day (BID) | INTRAVENOUS | Status: DC
  Administered 2019-09-29 – 2019-09-30 (×3): 100 mg via INTRAVENOUS
  Filled 2019-09-29 (×5): qty 10

## 2019-09-29 MED ORDER — TIZANIDINE HCL 4 MG PO TABS
2.0000 mg | ORAL_TABLET | Freq: Two times a day (BID) | ORAL | Status: DC | PRN
  Administered 2019-09-29 – 2019-10-01 (×4): 2 mg via ORAL
  Filled 2019-09-29 (×4): qty 1

## 2019-09-29 MED ORDER — VITAMIN D 25 MCG (1000 UNIT) PO TABS
2000.0000 [IU] | ORAL_TABLET | Freq: Every day | ORAL | Status: DC
  Administered 2019-09-29 – 2019-10-03 (×5): 2000 [IU] via ORAL
  Filled 2019-09-29 (×6): qty 2

## 2019-09-29 MED ORDER — INSULIN ASPART 100 UNIT/ML ~~LOC~~ SOLN: 0.0000 [IU] | Freq: Three times a day (TID) | SUBCUTANEOUS | Status: DC

## 2019-09-29 MED ORDER — LEVOTHYROXINE SODIUM 88 MCG PO TABS
88.0000 ug | ORAL_TABLET | Freq: Every day | ORAL | Status: DC
  Administered 2019-09-29 – 2019-10-04 (×6): 88 ug via ORAL
  Filled 2019-09-29 (×6): qty 1

## 2019-09-29 MED ORDER — ENOXAPARIN SODIUM 30 MG/0.3ML ~~LOC~~ SOLN
30.0000 mg | SUBCUTANEOUS | Status: DC
  Administered 2019-09-29 – 2019-10-03 (×5): 30 mg via SUBCUTANEOUS
  Filled 2019-09-29 (×6): qty 0.3

## 2019-09-29 MED ORDER — LINACLOTIDE 145 MCG PO CAPS
145.0000 ug | ORAL_CAPSULE | Freq: Every day | ORAL | Status: DC
  Administered 2019-09-29 – 2019-10-03 (×6): 145 ug via ORAL
  Filled 2019-09-29 (×6): qty 1

## 2019-09-29 MED ORDER — ASPIRIN EC 81 MG PO TBEC
81.0000 mg | DELAYED_RELEASE_TABLET | Freq: Every day | ORAL | Status: DC
  Administered 2019-09-29 – 2019-10-03 (×5): 81 mg via ORAL
  Filled 2019-09-29 (×5): qty 1

## 2019-09-29 MED ORDER — PANTOPRAZOLE SODIUM 40 MG PO TBEC
40.0000 mg | DELAYED_RELEASE_TABLET | Freq: Every day | ORAL | Status: DC
  Administered 2019-09-29 – 2019-10-03 (×5): 40 mg via ORAL
  Filled 2019-09-29 (×6): qty 1

## 2019-09-29 MED ORDER — POLYETHYLENE GLYCOL 3350 17 G PO PACK
17.0000 g | PACK | Freq: Every day | ORAL | Status: DC
  Administered 2019-09-29: 17 g via ORAL
  Filled 2019-09-29: qty 1

## 2019-09-29 MED ORDER — ACETAMINOPHEN 325 MG PO TABS
650.0000 mg | ORAL_TABLET | Freq: Four times a day (QID) | ORAL | Status: DC | PRN
  Administered 2019-09-29 – 2019-10-01 (×4): 650 mg via ORAL
  Filled 2019-09-29 (×4): qty 2

## 2019-09-29 MED ORDER — POTASSIUM CHLORIDE CRYS ER 20 MEQ PO TBCR
40.0000 meq | EXTENDED_RELEASE_TABLET | Freq: Once | ORAL | Status: AC
  Administered 2019-09-29: 40 meq via ORAL
  Filled 2019-09-29: qty 2

## 2019-09-29 MED ORDER — ADULT MULTIVITAMIN W/MINERALS CH
1.0000 | ORAL_TABLET | Freq: Every day | ORAL | Status: DC
  Administered 2019-09-29 – 2019-10-03 (×5): 1 via ORAL
  Filled 2019-09-29 (×5): qty 1

## 2019-09-29 MED ORDER — INSULIN ASPART PROT & ASPART (70-30 MIX) 100 UNIT/ML ~~LOC~~ SUSP
30.0000 [IU] | Freq: Two times a day (BID) | SUBCUTANEOUS | Status: DC
  Filled 2019-09-29: qty 10

## 2019-09-29 MED ORDER — METOLAZONE 2.5 MG PO TABS: 2.5000 mg | ORAL_TABLET | ORAL | Status: DC

## 2019-09-29 MED ORDER — GUAIFENESIN 100 MG/5ML PO SOLN
5.0000 mL | ORAL | Status: DC | PRN
  Administered 2019-09-29 – 2019-09-30 (×3): 100 mg via ORAL
  Filled 2019-09-29 (×3): qty 25

## 2019-09-29 MED ORDER — AMLODIPINE BESYLATE 10 MG PO TABS
10.0000 mg | ORAL_TABLET | Freq: Every day | ORAL | Status: DC
  Administered 2019-09-29 – 2019-10-01 (×3): 10 mg via ORAL
  Filled 2019-09-29 (×3): qty 1

## 2019-09-29 MED ORDER — CYCLOSPORINE 0.05 % OP EMUL
1.0000 [drp] | Freq: Two times a day (BID) | OPHTHALMIC | Status: DC
  Administered 2019-09-29 – 2019-10-03 (×10): 1 [drp] via OPHTHALMIC
  Filled 2019-09-29 (×12): qty 1

## 2019-09-29 MED ORDER — LORATADINE 10 MG PO TABS
10.0000 mg | ORAL_TABLET | Freq: Every day | ORAL | Status: DC
  Administered 2019-09-29 – 2019-10-03 (×5): 10 mg via ORAL
  Filled 2019-09-29 (×5): qty 1

## 2019-09-29 MED ORDER — INSULIN ASPART 100 UNIT/ML ~~LOC~~ SOLN
0.0000 [IU] | Freq: Three times a day (TID) | SUBCUTANEOUS | Status: DC
  Administered 2019-09-29: 5 [IU] via SUBCUTANEOUS
  Administered 2019-09-30: 7 [IU] via SUBCUTANEOUS
  Administered 2019-09-30: 3 [IU] via SUBCUTANEOUS
  Administered 2019-09-30 – 2019-10-01 (×2): 5 [IU] via SUBCUTANEOUS
  Administered 2019-10-01: 7 [IU] via SUBCUTANEOUS
  Administered 2019-10-01: 3 [IU] via SUBCUTANEOUS

## 2019-09-29 MED ORDER — LIDOCAINE 5 % EX PTCH
1.0000 | MEDICATED_PATCH | CUTANEOUS | Status: DC
  Administered 2019-09-29 – 2019-10-03 (×4): 1 via TRANSDERMAL
  Filled 2019-09-29 (×5): qty 1

## 2019-09-29 MED ORDER — DIAZEPAM 2 MG PO TABS
2.0000 mg | ORAL_TABLET | Freq: Every day | ORAL | Status: DC | PRN
  Administered 2019-09-29 – 2019-10-01 (×3): 2 mg via ORAL
  Filled 2019-09-29 (×3): qty 1

## 2019-09-29 MED ORDER — SODIUM CHLORIDE 0.9 % IV SOLN: INTRAVENOUS | Status: DC | PRN

## 2019-09-29 MED ORDER — GABAPENTIN 100 MG PO CAPS
200.0000 mg | ORAL_CAPSULE | Freq: Two times a day (BID) | ORAL | Status: DC
  Administered 2019-09-29 – 2019-10-01 (×6): 200 mg via ORAL
  Filled 2019-09-29 (×6): qty 2

## 2019-09-29 NOTE — Progress Notes (Signed)
[facility] PROGRESS NOTE   [name] KXF:[id number] DOB: [birth year] DOA: 09/16/2019  PCP: Serita Grammes, MD  Brief History/Interval Summary: [name] is a 79 y.o. female with history of chronic diastolic CHF, chronic kidney disease stage III, CAD, diabetes mellitus type 2, paroxysmal atrial fibrillation admitted and discharged 2 weeks ago for acute renal failure.  It appears like she was hydrated during her last hospitalization.  She was discharged on May 15. Prior to that patient also admitted 2 months ago for acute renal failure and fluid overload required 1 session of dialysis.  She presented to the ED because of worsening shortness of breath and peripheral edema.  Patient states since discharge she was doing fine over the last 1 week her shortness of breath are worsening mostly on exertion and increasing peripheral edema with patient gaining 10 pounds.  Had gone to her nephrologist and her torsemide dose was increased 200 mg twice daily.  Despite taking which patient was still retaining fluid.    Chest x-ray in the ED showed pulmonary edema and bilateral pleural effusions.  Patient was hospitalized for further management.  Reason for Visit: Fluid overload in the setting of chronic kidney disease  Consultants: Nephrology has been consulted  Procedures: None yet  Antibiotics: Anti-infectives (From admission, onward)   None      Subjective/Interval History: Patient states that she has been short of breath.  Slightly better compared to yesterday.  Gets short of breath even with talking.  Denies any chest pain.  No nausea vomiting.  Leg edema seems to be slightly better.  Nursing staff reported pauses on telemetry overnight.  ROS: Denies any headaches.  No fever or chills.    Assessment/Plan:  Fluid overload in the setting of chronic kidney disease stage IIIb and chronic diastolic CHF/acute respiratory failure with hypoxia There is likely a  component of acute diastolic CHF along with primarily a renal etiology for her fluid overload.  She was seen by her nephrologist on Monday and her dose of diuretics were increased.  Despite this patient presents with overt fluid overload.   She is currently on furosemide 100 mg twice a day.  Also noted to be on metolazone twice a week.  Follow ins and outs.  Daily weights.   This presentation could be suggestive of for progressive kidney disease.  Nephrology has been consulted to assist with management.  There is an echocardiogram scanned in from [place] under chart review.  This was done in January 2021.  Systolic function was noted to be normal.  Mild LVH was noted. She tells me that she was discharged on home oxygen at 2 L/min the last time she was in the hospital.  History of paroxysmal atrial fibrillation/bradycardia with pauses Noted to be bradycardic overnight.  Based on previous records this is known to happen in this patient.  She is not on any AV blocking agents.  She was on clonidine during her previous hospitalization but that was discontinued.  She is asymptomatic for the most part.  This is all likely exacerbated due to her fluid overload.  At this time continue to monitor on telemetry.  Correct her electrolytes.  Magnesium noted to be 2.2.  Potassium is low at 3.3.  She used to be on Eliquis previously but it appears that this was discontinued within the last 1 to 2 months.  Reason for the discontinuation is not clear.  Anemia of chronic kidney disease Hemoglobin to be monitored closely.  No evidence  of overt bleeding.  History of rheumatoid arthritis and Sjogren's disease On steroids chronically.  Continue with prednisone.  Chronic venous stasis lower extremities She has chronic hyperpigmentation.  She does have some pitting edema bilateral lower extremities.  Diabetes mellitus type 2, uncontrolled with hyperglycemia Patient on insulin at home.  No recent changes to her dosing.   She tells me that previously her blood sugars used to run between 100-200.  Lately they have been running 70-90.  Progression of her kidney disease could be the reason for her lower insulin requirements.  Since she has been quite hyperglycemic I have discontinued the insulin for now.  HbA1c was 8.5 on May 11.  Essential hypertension Currently on amlodipine.  Monitor blood pressures closely.  Hypothyroidism Continue levothyroxine  Pressure injury, present on admission Pressure Injury 09/11/19 Sacrum Stage 1 -  Intact skin with non-blanchable redness of a localized area usually over a bony prominence. pink (Active)  09/11/19 1750  Location: Sacrum  Location Orientation:   Staging: Stage 1 -  Intact skin with non-blanchable redness of a localized area usually over a bony prominence.  Wound Description (Comments): pink  Present on Admission: Yes    Obesity Estimated body mass index is 34.6 kg/m as calculated from the following:   Height as of this encounter: 4\' 11"  (1.499 m).   Weight as of this encounter: 77.7 kg.   DVT Prophylaxis: Lovenox Code Status: Full code Family Communication: Discussed with the patient and her daughter Disposition Plan:  Status is: Inpatient  Remains inpatient appropriate because:IV treatments appropriate due to intensity of illness or inability to take PO   Dispo: The patient is from: Home              Anticipated d/c is to: Home              Anticipated d/c date is: > 3 days              Patient currently is not medically stable to d/c.      Medications:  Scheduled: . amLODipine  10 mg Oral Daily  . aspirin EC  81 mg Oral Daily  . cholecalciferol  2,000 Units Oral Daily  . cycloSPORINE  1 drop Both Eyes BID  . enoxaparin (LOVENOX) injection  30 mg Subcutaneous Q24H  . gabapentin  200 mg Oral BID  . levothyroxine  88 mcg Oral Q0600  . lidocaine  1 patch Transdermal Q24H  . linaclotide  145 mcg Oral Daily  . loratadine  10 mg Oral Daily  .  [START ON 10/03/2019] metolazone  2.5 mg Oral Once per day on Wed Fri  . multivitamin with minerals  1 tablet Oral Daily  . oxyCODONE  10 mg Oral Q12H  . pantoprazole  40 mg Oral Daily  . polyethylene glycol  17 g Oral Daily  . predniSONE  5 mg Oral Q breakfast  . sodium chloride flush  3 mL Intravenous Once   Continuous: . furosemide 100 mg (09/29/19 0628)   10/01/19, tiZANidine   Objective:  Vital Signs  Vitals:   09/29/19 0451 09/29/19 0509 09/29/19 0850 09/29/19 0904  BP: 124/62 124/73 (!) 155/68   Pulse: 72 76 96   Resp: 18 18 20    Temp: (!) 97.5 F (36.4 C) (!) 97.5 F (36.4 C) (!) 97.5 F (36.4 C)   TempSrc: Oral Oral Oral   SpO2: 96% 93% 90% 97%  Weight:      Height:  Intake/Output Summary (Last 24 hours) at 09/29/2019 1008 Last data filed at 09/29/2019 0843 Gross per 24 hour  Intake -  Output 1000 ml  Net -1000 ml   Filed Weights   15-Oct-2019 2035 09/29/19 0152  Weight: 75 kg 77.7 kg    General appearance: Awake alert.  In no distress Resp: Mildly tachypneic at rest.  No use of accessory muscles.  Crackles heard bilaterally.  Dullness to percussion at the bases bilaterally.  Occasional wheezing.  No rhonchi. Cardio: S1-S2 is normal regular.  No S3-S4.  No rubs murmurs or bruit telemetry reviewed.  Noted to be bradycardiac with heart rate as low as in the 40s overnight.  Pauses noted.  When she woke up heart rate noted to be in the 90s. GI: Abdomen is soft.  Nontender nondistended.  Bowel sounds are present normal.  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 Neurologic: Alert and oriented x3.  No focal neurological deficits.    Lab Results:  Data Reviewed: I have personally reviewed following labs and imaging studies  CBC: Recent Labs  Lab Oct 15, 2019 1751 09/29/19 0857  WBC 9.1 7.4  NEUTROABS  --  5.8  HGB 9.9* 9.3*  HCT 32.2* 31.0*  MCV 103.5* 104.0*  PLT 266 223    Basic Metabolic Panel:  Recent Labs  Lab Oct 15, 2019 1751 09/29/19 0857  NA 141 141  K 3.7 3.3*  CL 92* 92*  CO2 33* 35*  GLUCOSE 121* 167*  BUN 73* 68*  CREATININE 1.61* 1.55*  CALCIUM 9.6 9.1  MG  --  2.2    GFR: Estimated Creatinine Clearance: 27.4 mL/min (A) (by C-G formula based on SCr of 1.55 mg/dL (H)).  Liver Function Tests: Recent Labs  Lab 09/29/19 0857  AST 41  ALT 38  ALKPHOS 224*  BILITOT 0.8  PROT 6.0*  ALBUMIN 2.8*    CBG: Recent Labs  Lab October 15, 2019 2218 10-15-19 2304 09/29/19 0642 09/29/19 0700 09/29/19 0742  GLUCAP 65* 78 48* 65* 114*     Recent Results (from the past 240 hour(s))  SARS Coronavirus 2 by RT PCR (hospital order, performed in [facility] hospital lab) Nasopharyngeal Nasopharyngeal Swab     Status: None   Collection Time: 2019-10-15 11:24 PM   Specimen: Nasopharyngeal Swab  Result Value Ref Range Status   SARS Coronavirus 2 NEGATIVE NEGATIVE Final    Comment: (NOTE) SARS-CoV-2 target nucleic acids are NOT DETECTED. The SARS-CoV-2 RNA is generally detectable in upper and lower respiratory specimens during the acute phase of infection. The lowest concentration of SARS-CoV-2 viral copies this assay can detect is 250 copies / mL. A negative result does not preclude SARS-CoV-2 infection and should not be used as the sole basis for treatment or other patient management decisions.  A negative result may occur with improper specimen collection / handling, submission of specimen other than nasopharyngeal swab, presence of viral mutation(s) within the areas targeted by this assay, and inadequate number of viral copies (<250 copies / mL). A negative result must be combined with clinical observations, patient history, and epidemiological information. Fact Sheet for Patients:   BoilerBrush[employer]com[employer]cy Fact Sheet for Healthcare Providers: https://pope.com/ This test is not yet approved or cleared  by the [place] FDA  and has been authorized for detection and/or diagnosis of SARS-CoV-2 by FDA under an Emergency Use Authorization (EUA).  This EUA will remain in effect (meaning this test can be used) for the duration of the COVID-19 declaration under Section 564(b)(1)  of the Act, 21 U.S.C. section 360bbb-3(b)(1), unless the authorization is terminated or revoked sooner. Performed at [facility], [address], [place], [place] [address]       Radiology Studies: DG Chest 2 View  Result Date: 09/22/2019 CLINICAL DATA:  Short of breath EXAM: CHEST - 2 VIEW COMPARISON:  09/10/2019 FINDINGS: Small bilateral pleural effusions. Enlarged cardiomediastinal silhouette with vascular congestion and moderate interstitial pulmonary edema. Basilar airspace disease. Aortic atherosclerosis. No pneumothorax. IMPRESSION: 1. Cardiomegaly with vascular congestion, interstitial pulmonary edema and small pleural effusions 2. Basilar consolidations may reflect superimposed atelectasis or pneumonia Electronically Signed   By: Jasmine Pang M.D.   On: 09/13/2019 18:57       LOS: 1 day   Claudia Greenley  [facility] Pager on [email or web address]  09/29/2019, 10:08 AM

## 2019-09-29 NOTE — Progress Notes (Addendum)
   09/29/19 0509  Vitals  Temp (!) 97.5 F (36.4 C)  Temp Source Oral  BP 124/73  MAP (mmHg) 87  BP Location Left Arm  BP Method Automatic  Patient Position (if appropriate) Lying  Pulse Rate 76  Pulse Rate Source Monitor  Resp 18  Oxygen Therapy  SpO2 93 %  O2 Device Nasal Cannula  O2 Flow Rate (L/min) 2 L/min  MEWS Score  MEWS Temp 0  MEWS Systolic 0  MEWS Pulse 0  MEWS RR 0  MEWS LOC 0  MEWS Score 0  MEWS Score Color Green  Patient had 4 pauses back to back of 4.31,2.90,2.8,2.29 seconds and HR brady to 33. This RN rushed to bed side to assess patient. Patient was asleep and was awakened with touch. EKG performed. Vital signs post incident above. Patient is asymptomatic. Primary RN Skeet Simmer paged on call provider [place] and made aware. Orders received for STAT CBC and CMP. Will continue to monitor patient.

## 2019-09-29 NOTE — Progress Notes (Signed)
Pt had Pulse Rhythm of 4.44 seconds and has been having pulses every 5 to 10 minutes. Currently, pt is sleeping.  The on-call provider has been notified via Amion Page. Waiting for a response. Will continue to monitor the patient.
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
## 2019-09-30 DIAGNOSIS — E1165 Type 2 diabetes mellitus with hyperglycemia: Secondary | ICD-10-CM

## 2019-09-30 DIAGNOSIS — I480 Paroxysmal atrial fibrillation: Secondary | ICD-10-CM

## 2019-09-30 DIAGNOSIS — I5031 Acute diastolic (congestive) heart failure: Secondary | ICD-10-CM

## 2019-09-30 LAB — CBC
HCT: 30.2 % — ABNORMAL LOW (ref 36.0–46.0)
Hemoglobin: 9.1 g/dL — ABNORMAL LOW (ref 12.0–15.0)
MCH: 31.5 pg (ref 26.0–34.0)
MCHC: 30.1 g/dL (ref 30.0–36.0)
MCV: 104.5 fL — ABNORMAL HIGH (ref 80.0–100.0)
Platelets: 215 10*3/uL (ref 150–400)
RBC: 2.89 MIL/uL — ABNORMAL LOW (ref 3.87–5.11)
RDW: 14 % (ref 11.5–15.5)
WBC: 8.1 10*3/uL (ref 4.0–10.5)
nRBC: 0.5 % — ABNORMAL HIGH (ref 0.0–0.2)

## 2019-09-30 LAB — GLUCOSE, CAPILLARY
Glucose-Capillary: 232 mg/dL — ABNORMAL HIGH (ref 70–99)
Glucose-Capillary: 227 mg/dL — ABNORMAL HIGH (ref 70–99)
Glucose-Capillary: 299 mg/dL — ABNORMAL HIGH (ref 70–99)
Glucose-Capillary: 319 mg/dL — ABNORMAL HIGH (ref 70–99)
Glucose-Capillary: 308 mg/dL — ABNORMAL HIGH (ref 70–99)

## 2019-09-30 LAB — RENAL FUNCTION PANEL
Albumin: 2.8 g/dL — ABNORMAL LOW (ref 3.5–5.0)
Anion gap: 12 (ref 5–15)
BUN: 73 mg/dL — ABNORMAL HIGH (ref 8–23)
CO2: 33 mmol/L — ABNORMAL HIGH (ref 22–32)
Calcium: 9.1 mg/dL (ref 8.9–10.3)
Chloride: 92 mmol/L — ABNORMAL LOW (ref 98–111)
Creatinine, Ser: 1.61 mg/dL — ABNORMAL HIGH (ref 0.44–1.00)
GFR calc Af Amer: 35 mL/min — ABNORMAL LOW (ref >60)
GFR calc non Af Amer: 31 mL/min — ABNORMAL LOW (ref >60)
Glucose, Bld: 252 mg/dL — ABNORMAL HIGH (ref 70–99)
Phosphorus: 4.7 mg/dL — ABNORMAL HIGH (ref 2.5–4.6)
Potassium: 3.8 mmol/L (ref 3.5–5.1)
Sodium: 137 mmol/L (ref 135–145)

## 2019-09-30 LAB — FERRITIN: Ferritin: 95 ng/mL (ref 11–307)

## 2019-09-30 LAB — IRON AND TIBC
Iron: 40 ug/dL (ref 28–170)
Saturation Ratios: 14 % (ref 10.4–31.8)
TIBC: 277 ug/dL (ref 250–450)
UIBC: 237 ug/dL

## 2019-09-30 MED ORDER — POTASSIUM CHLORIDE CRYS ER 20 MEQ PO TBCR
40.0000 meq | EXTENDED_RELEASE_TABLET | Freq: Once | ORAL | Status: AC
  Administered 2019-09-30: 40 meq via ORAL
  Filled 2019-09-30: qty 2

## 2019-09-30 MED ORDER — POLYETHYLENE GLYCOL 3350 17 G PO PACK
17.0000 g | PACK | Freq: Two times a day (BID) | ORAL | Status: DC
  Administered 2019-09-30 – 2019-10-03 (×6): 17 g via ORAL
  Filled 2019-09-30 (×6): qty 1

## 2019-09-30 MED ORDER — METOLAZONE 5 MG PO TABS
5.0000 mg | ORAL_TABLET | Freq: Every day | ORAL | Status: DC
  Administered 2019-09-30 – 2019-10-01 (×2): 5 mg via ORAL
  Filled 2019-09-30 (×3): qty 1

## 2019-09-30 MED ORDER — METOLAZONE 5 MG PO TABS
5.0000 mg | ORAL_TABLET | Freq: Once | ORAL | Status: AC
  Administered 2019-09-30: 5 mg via ORAL
  Filled 2019-09-30: qty 1

## 2019-09-30 MED ORDER — SODIUM CHLORIDE 0.9 % IV SOLN
250.0000 mg | Freq: Every day | INTRAVENOUS | Status: AC
  Administered 2019-09-30 – 2019-10-03 (×4): 250 mg via INTRAVENOUS
  Filled 2019-09-30 (×4): qty 20

## 2019-09-30 MED ORDER — FUROSEMIDE 10 MG/ML IJ SOLN
120.0000 mg | Freq: Three times a day (TID) | INTRAVENOUS | Status: DC
  Administered 2019-09-30 – 2019-10-01 (×3): 120 mg via INTRAVENOUS
  Filled 2019-09-30: qty 12
  Filled 2019-09-30 (×2): qty 10
  Filled 2019-09-30: qty 12
  Filled 2019-09-30: qty 10

## 2019-09-30 MED ORDER — FUROSEMIDE 10 MG/ML IJ SOLN
120.0000 mg | Freq: Three times a day (TID) | INTRAVENOUS | Status: DC
  Filled 2019-09-30 (×2): qty 12

## 2019-09-30 MED ORDER — SPIRONOLACTONE 25 MG PO TABS
25.0000 mg | ORAL_TABLET | Freq: Every day | ORAL | Status: DC
  Administered 2019-09-30 – 2019-10-01 (×2): 25 mg via ORAL
  Filled 2019-09-30 (×3): qty 1

## 2019-09-30 MED ORDER — INSULIN DETEMIR 100 UNIT/ML ~~LOC~~ SOLN
8.0000 [IU] | Freq: Every day | SUBCUTANEOUS | Status: DC
  Administered 2019-09-30: 8 [IU] via SUBCUTANEOUS
  Filled 2019-09-30 (×2): qty 0.08

## 2019-09-30 MED ORDER — OXYCODONE HCL 5 MG PO TABS
5.0000 mg | ORAL_TABLET | ORAL | Status: DC | PRN
  Administered 2019-09-30 – 2019-10-01 (×2): 5 mg via ORAL
  Filled 2019-09-30 (×2): qty 1

## 2019-09-30 MED ORDER — MORPHINE SULFATE (PF) 2 MG/ML IV SOLN
2.0000 mg | Freq: Once | INTRAVENOUS | Status: AC
  Administered 2019-09-30: 2 mg via INTRAVENOUS
  Filled 2019-09-30: qty 1

## 2019-09-30 NOTE — Progress Notes (Signed)
Admit: 10/18/19 LOS: 92  79 year old female with history of diastolic congestive heart failure, CKD, CAD, DM 2, proximal atrial fibrillation recently admitted for fluid overload who presents with heart failure exacerbation  Subjective:  . Patient continues to have difficulty breathing.  Has not seen significant change in her urine output.  Denies fevers, chills, nausea, vomiting.  05/29 0701 - 05/30 0700 In: 240 [P.O.:240] Out: 1540 [Urine:1540]  Filed Weights   10-18-19 2035 09/29/19 0152 09/30/19 0127  Weight: 75 kg 77.7 kg 82.4 kg    Scheduled Meds: . amLODipine  10 mg Oral Daily  . aspirin EC  81 mg Oral Daily  . cholecalciferol  2,000 Units Oral Daily  . cycloSPORINE  1 drop Both Eyes BID  . enoxaparin (LOVENOX) injection  30 mg Subcutaneous Q24H  . gabapentin  200 mg Oral BID  . insulin aspart  0-9 Units Subcutaneous TID WC  . insulin detemir  8 Units Subcutaneous Daily  . levothyroxine  88 mcg Oral Q0600  . lidocaine  1 patch Transdermal Q24H  . linaclotide  145 mcg Oral Daily  . loratadine  10 mg Oral Daily  . multivitamin with minerals  1 tablet Oral Daily  . oxyCODONE  10 mg Oral Q12H  . pantoprazole  40 mg Oral Daily  . polyethylene glycol  17 g Oral BID  . predniSONE  5 mg Oral Q breakfast  . spironolactone  25 mg Oral Daily   Continuous Infusions: . sodium chloride 10 mL/hr at 09/29/19 1713  . ferric gluconate (FERRLECIT/NULECIT) IV    . furosemide 120 mg (09/30/19 1021)   PRN Meds:.sodium chloride, acetaminophen, diazepam, guaiFENesin, oxyCODONE, tiZANidine  Current Labs: reviewed    Physical Exam:  Blood pressure (!) 146/55, pulse 62, temperature (!) 97.4 F (36.3 C), temperature source Oral, resp. rate 20, height 4\' 11"  (1.499 m), weight 82.4 kg, SpO2 91 %. GEN: Sitting in bed, no distress ENT: no nasal discharge, mmm EYES: no scleral icterus, eomi CV: normal rate, no murmurs PULM: Mild increased work of breathing, bilateral chest rise ABD: NABS,  non-distended SKIN: no rashes or jaundice EXT: 2+ pitting edema in the bilateral lower extremities, warm  A 79 year old female with history of diastolic congestive heart failure, CKD, CAD, DM 2, proximal atrial fibrillation recently admitted for fluid overload who presents with heart failure exacerbation  P 1. Acute on chronic congestive heart failure with exacerbation and volume overload: Recurring volume overload somewhat diuretic refractory requiring high doses of diuretics and frequent hospitalization for IV medications.  Minimally improved with IV diuretics yesterday need to step up diuretics to hopefully achieve net negative status today. 1. Lasix 120 mg every 8 hours 2. Metolazone 5 mg daily 3. Spironolactone 25 mg daily 4. Strict intake and output and daily weights 5. Low-salt diet 2. Hypoxic respiratory failure: Requiring supplemental oxygen.  Diuresing as above.  Titrate oxygen down as able 3. Chronic kidney disease: Follows with [facility] outpatient.  History of renal failure requiring 1 session of dialysis several months ago.  Creatinine appears to be baseline at this time or even slightly better due to dilution.  We will continue to monitor with diuresis.  No indications for dialysis at this time. 4. Anemia of CKD with iron deficiency: Hemoglobin 9.1.  Likely associated with CKD.  Iron sat 14 and ferritin 95.  Ferritin gluconate 250 mg daily for x4 doses ordered 5. Rheumatoid arthritis and Sjogren's syndrome: Continue prednisone 6. Hypertension: Continue home amlodipine 7. History of atrial fibrillation:  Rate controlled not anticoagulation . Medication Issues; o Preferred narcotic agents for pain control are hydromorphone, fentanyl, and methadone. Morphine should not be used.  o Baclofen should be avoided o Avoid oral sodium phosphate and magnesium citrate based laxatives / bowel preps    Louie Bun, MD 09/30/2019, 2:43 PM  Recent Labs  Lab 11-Oct-2019 1751  09/29/19 0857 09/30/19 0655  NA 141 141 137  K 3.7 3.3* 3.8  CL 92* 92* 92*  CO2 33* 35* 33*  GLUCOSE 121* 167* 252*  BUN 73* 68* 73*  CREATININE 1.61* 1.55* 1.61*  CALCIUM 9.6 9.1 9.1  PHOS  --   --  4.7*   Recent Labs  Lab Oct 11, 2019 1751 09/29/19 0857 09/30/19 0655  WBC 9.1 7.4 8.1  NEUTROABS  --  5.8  --   HGB 9.9* 9.3* 9.1*  HCT 32.2* 31.0* 30.2*  MCV 103.5* 104.0* 104.5*  PLT 266 223 215    Plan communicated to the primary team

## 2019-09-30 NOTE — Progress Notes (Addendum)
[facility] PROGRESS NOTE   [name] XFG:[id number] DOB: November 04, 1940 DOA: 2019-10-08  PCP: Buckner Malta, MD  Brief History/Interval Summary: [name] is a 79 y.o. female with history of chronic diastolic CHF, chronic kidney disease stage III, CAD, diabetes mellitus type 2, paroxysmal atrial fibrillation, admitted and discharged 2 weeks ago for acute renal failure.  It appears like she was hydrated during her last hospitalization.  She was discharged on May 15. Prior to that patient also admitted 2 months ago for acute renal failure and fluid overload required 1 session of dialysis.  She presented to the ED because of worsening shortness of breath and peripheral edema.  Patient states since discharge she was doing fine over the last 1 week her shortness of breath are worsening mostly on exertion and increasing peripheral edema with patient gaining 10 pounds.  Had gone to her nephrologist and her torsemide dose was increased 200 mg twice daily.  Despite taking which patient was still retaining fluid.    Chest x-ray in the ED showed pulmonary edema and bilateral pleural effusions.  Patient was hospitalized for further management.  Reason for Visit: Fluid overload in the setting of chronic kidney disease  Consultants: Nephrology  Procedures: None yet  Antibiotics: Anti-infectives (From admission, onward)   None      Subjective/Interval History: Patient states that she is feeling slightly better compared to yesterday although still short of breath.  Finds it difficult to lay down flat on her bed.  Requesting that she be set up in the chair.  Agreeable to physical and occupational therapy.  Denies any nausea or vomiting.  Her daughter is at the bedside.  Patient does complain of some anxiety and requesting medications for same.    ROS: Denies any chest pain.    Assessment/Plan:  Fluid overload in the setting of chronic kidney disease stage IIIb and  chronic diastolic CHF/acute respiratory failure with hypoxia There is likely a component of acute diastolic CHF along with primarily a renal etiology for her fluid overload.  She was seen by her nephrologist on Monday, May 24, and her dose of diuretics were increased.  Despite this patient presented with overt fluid overload.   This presentation could be suggestive of for progressive kidney disease.  There is an echocardiogram scanned in from [place] under chart review.  This was done in January 2021.  Systolic function was noted to be normal.  Mild LVH was noted. She tells me that she was discharged on home oxygen at 2 L/min the last time she was in the hospital. She was hospitalized.  She was placed on furosemide 100 mg intravenously twice daily.  Also placed on her usual dose of metolazone.  Nephrology was consulted to assist with management Discussed with Dr. Melanee Spry today.  He will increase the dose of her furosemide as patient appears to have gained some weight.  Continue to monitor ins and outs, daily weights.  Renal function.  Correct electrolytes.  Mobilize.  PT and OT.  Out of bed to chair. Fluid overload and air hunger could be making her anxious.  We will give her a dose of morphine.   History of paroxysmal atrial fibrillation/bradycardia with pauses Patient noted to be bradycardic at nighttime when she is sleeping.  Based on previous records this is known to happen in this patient.  She is not on any AV blocking agents.  She was on clonidine during her previous hospitalization but that was discontinued.  This is  all likely exacerbated due to her fluid overload.  She remains asymptomatic.  Continue to monitor on telemetry.  Not as many pauses last night compared to the previous night.  Keep her potassium greater than 4.0.  Magnesium was 2.2 yesterday.  Clarified anticoagulation with patient and family.  She was never on anticoagulation for her atrial fibrillation.  She only takes aspirin.   Will defer this to her outpatient providers.   Anemia of chronic kidney disease Hemoglobin to be monitored closely.  No evidence of overt bleeding.  History of rheumatoid arthritis and Sjogren's disease On steroids chronically.  Continue with prednisone.  Chronic venous stasis lower extremities She has chronic hyperpigmentation.  She does have some pitting edema bilateral lower extremities.  Diabetes mellitus type 2, uncontrolled with hyperglycemia Patient on insulin at home.  No recent changes to her dosing.  She tells me that previously her blood sugars used to run between 100-200.  Lately they have been running 70-90.  Progression of her kidney disease could be the reason for her lower insulin requirements.   Insulin was initially held.  CBGs now running consistently in the 200s.  We will resume her long-acting insulin.  For more consistent control we will use Levemir.  She is on 70/30 insulin at home.  Wonder if there is a financial reason for the same.  HbA1c was 8.5 on May 11.  Essential hypertension Blood pressure is noted to be running high occasionally.  Currently on amlodipine at 10 mg daily.  May need to add additional agents although her bradycardia and CKD limits our choices.  Hypothyroidism Continue levothyroxine  Pressure injury, present on admission Pressure Injury 09/11/19 Sacrum Stage 1 -  Intact skin with non-blanchable redness of a localized area usually over a bony prominence. pink (Active)  09/11/19 1750  Location: Sacrum  Location Orientation:   Staging: Stage 1 -  Intact skin with non-blanchable redness of a localized area usually over a bony prominence.  Wound Description (Comments): pink  Present on Admission: Yes    Obesity Estimated body mass index is 36.69 kg/m as calculated from the following:   Height as of this encounter: 4\' 11"  (1.499 m).   Weight as of this encounter: 82.4 kg.   DVT Prophylaxis: Lovenox Code Status: Full code Family  Communication: Discussed with patient and her daughter. Disposition Plan:  Status is: Inpatient  Remains inpatient appropriate because:IV treatments appropriate due to intensity of illness or inability to take PO   Dispo: The patient is from: Home              Anticipated d/c is to: Home              Anticipated d/c date is: > 3 days              Patient currently is not medically stable to d/c.      Medications:  Scheduled: . amLODipine  10 mg Oral Daily  . aspirin EC  81 mg Oral Daily  . cholecalciferol  2,000 Units Oral Daily  . cycloSPORINE  1 drop Both Eyes BID  . enoxaparin (LOVENOX) injection  30 mg Subcutaneous Q24H  . gabapentin  200 mg Oral BID  . insulin aspart  0-9 Units Subcutaneous TID WC  . levothyroxine  88 mcg Oral Q0600  . lidocaine  1 patch Transdermal Q24H  . linaclotide  145 mcg Oral Daily  . loratadine  10 mg Oral Daily  . multivitamin with minerals  1 tablet  Oral Daily  . oxyCODONE  10 mg Oral Q12H  . pantoprazole  40 mg Oral Daily  . polyethylene glycol  17 g Oral BID  . predniSONE  5 mg Oral Q breakfast  . spironolactone  25 mg Oral Daily   Continuous: . sodium chloride 10 mL/hr at 09/29/19 1713  . furosemide     GGY:IRSWNI chloride, acetaminophen, diazepam, guaiFENesin, oxyCODONE, tiZANidine   Objective:  Vital Signs  Vitals:   09/30/19 0127 09/30/19 0628 09/30/19 0746 09/30/19 0921  BP:  (!) 145/66 (!) 169/69 (!) 155/70  Pulse:  60 73 (!) 51  Resp:  20 (!) 22   Temp:  (!) 97.5 F (36.4 C) (!) 97.3 F (36.3 C)   TempSrc:  Oral Oral   SpO2:  98% 93% 92%  Weight: 82.4 kg     Height:        Intake/Output Summary (Last 24 hours) at 09/30/2019 1005 Last data filed at 09/30/2019 0624 Gross per 24 hour  Intake 240 ml  Output 940 ml  Net -700 ml   Filed Weights   09/01/2019 2035 09/29/19 0152 09/30/19 0127  Weight: 75 kg 77.7 kg 82.4 kg    General appearance: Awake alert.  In no distress Resp: Less tachypneic today compared to  yesterday.  No use of accessory muscles.  Continues to have a few crackles in the bases bilaterally.  Dullness to percussion of the bases.  No wheezing today.  No rhonchi.   Cardio: S1-S2 is normal regular.  No S3-S4.  No rubs murmurs or bruit.  Telemetry shows that heart rate was in the 60s occasionally dropping into the 40s at nighttime.  No significant pauses noted. GI: Abdomen is soft.  Nontender nondistended.  Bowel sounds are present normal.  No masses organomegaly Extremities: Chronic venous stasis dermatitis with edema noted Neurologic: Alert and oriented x3.  No focal neurological deficits.      Lab Results:  Data Reviewed: I have personally reviewed following labs and imaging studies  CBC: Recent Labs  Lab 09/09/2019 1751 09/29/19 0857 09/30/19 0655  WBC 9.1 7.4 8.1  NEUTROABS  --  5.8  --   HGB 9.9* 9.3* 9.1*  HCT 32.2* 31.0* 30.2*  MCV 103.5* 104.0* 104.5*  PLT 266 223 215    Basic Metabolic Panel: Recent Labs  Lab 09/01/2019 1751 09/29/19 0857 09/30/19 0655  NA 141 141 137  K 3.7 3.3* 3.8  CL 92* 92* 92*  CO2 33* 35* 33*  GLUCOSE 121* 167* 252*  BUN 73* 68* 73*  CREATININE 1.61* 1.55* 1.61*  CALCIUM 9.6 9.1 9.1  MG  --  2.2  --   PHOS  --   --  4.7*    GFR: Estimated Creatinine Clearance: 27.2 mL/min (A) (by C-G formula based on SCr of 1.61 mg/dL (H)).  Liver Function Tests: Recent Labs  Lab 09/29/19 0857 09/30/19 0655  AST 41  --   ALT 38  --   ALKPHOS 224*  --   BILITOT 0.8  --   PROT 6.0*  --   ALBUMIN 2.8* 2.8*    CBG: Recent Labs  Lab 09/29/19 1130 09/29/19 1609 09/29/19 2128 09/30/19 0624 09/30/19 0817  GLUCAP 222* 272* 333* 232* 227*     Recent Results (from the past 240 hour(s))  SARS Coronavirus 2 by RT PCR (hospital order, performed in [facility] hospital lab) Nasopharyngeal Nasopharyngeal Swab     Status: None   Collection Time: 09/21/2019 11:24 PM   Specimen:  Nasopharyngeal Swab  Result Value Ref Range Status   SARS  Coronavirus 2 NEGATIVE NEGATIVE Final    Comment: (NOTE) SARS-CoV-2 target nucleic acids are NOT DETECTED. The SARS-CoV-2 RNA is generally detectable in upper and lower respiratory specimens during the acute phase of infection. The lowest concentration of SARS-CoV-2 viral copies this assay can detect is 250 copies / mL. A negative result does not preclude SARS-CoV-2 infection and should not be used as the sole basis for treatment or other patient management decisions.  A negative result may occur with improper specimen collection / handling, submission of specimen other than nasopharyngeal swab, presence of viral mutation(s) within the areas targeted by this assay, and inadequate number of viral copies (<250 copies / mL). A negative result must be combined with clinical observations, patient history, and epidemiological information. Fact Sheet for Patients:   StrictlyIdeas.no Fact Sheet for Healthcare Providers: BankingDealers.co.za This test is not yet approved or cleared  by the [place] FDA and has been authorized for detection and/or diagnosis of SARS-CoV-2 by FDA under an Emergency Use Authorization (EUA).  This EUA will remain in effect (meaning this test can be used) for the duration of the COVID-19 declaration under Section 564(b)(1) of the Act, 21 U.S.C. section 360bbb-3(b)(1), unless the authorization is terminated or revoked sooner. Performed at [facility], [place] [address] [place],  [address]       Radiology Studies: DG Chest 2 View  Result Date: 09/04/2019 CLINICAL DATA:  Short of breath EXAM: CHEST - 2 VIEW COMPARISON:  09/10/2019 FINDINGS: Small bilateral pleural effusions. Enlarged cardiomediastinal silhouette with vascular congestion and moderate interstitial pulmonary edema. Basilar airspace disease. Aortic atherosclerosis. No pneumothorax. IMPRESSION: 1. Cardiomegaly with vascular congestion,  interstitial pulmonary edema and small pleural effusions 2. Basilar consolidations may reflect superimposed atelectasis or pneumonia Electronically Signed   By: Donavan Foil M.D.   On: 09/22/2019 18:57       LOS: 2 days   [facility] Pager on [email or web address]  09/30/2019, 10:05 AM
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
## 2019-10-01 ENCOUNTER — Inpatient Hospital Stay (HOSPITAL_COMMUNITY): Payer: Medicare Other

## 2019-10-01 DIAGNOSIS — N179 Acute kidney failure, unspecified: Secondary | ICD-10-CM

## 2019-10-01 DIAGNOSIS — J9601 Acute respiratory failure with hypoxia: Secondary | ICD-10-CM

## 2019-10-01 DIAGNOSIS — I509 Heart failure, unspecified: Secondary | ICD-10-CM

## 2019-10-01 DIAGNOSIS — N1832 Chronic kidney disease, stage 3b: Secondary | ICD-10-CM

## 2019-10-01 DIAGNOSIS — J9600 Acute respiratory failure, unspecified whether with hypoxia or hypercapnia: Secondary | ICD-10-CM | POA: Diagnosis present

## 2019-10-01 LAB — RENAL FUNCTION PANEL
Albumin: 2.9 g/dL — ABNORMAL LOW (ref 3.5–5.0)
Albumin: 2.7 g/dL — ABNORMAL LOW (ref 3.5–5.0)
Anion gap: 15 (ref 5–15)
Anion gap: 6 (ref 5–15)
BUN: 78 mg/dL — ABNORMAL HIGH (ref 8–23)
BUN: 80 mg/dL — ABNORMAL HIGH (ref 8–23)
CO2: 31 mmol/L (ref 22–32)
CO2: 38 mmol/L — ABNORMAL HIGH (ref 22–32)
Calcium: 9.3 mg/dL (ref 8.9–10.3)
Calcium: 9.2 mg/dL (ref 8.9–10.3)
Chloride: 89 mmol/L — ABNORMAL LOW (ref 98–111)
Chloride: 90 mmol/L — ABNORMAL LOW (ref 98–111)
Creatinine, Ser: 1.86 mg/dL — ABNORMAL HIGH (ref 0.44–1.00)
Creatinine, Ser: 2.15 mg/dL — ABNORMAL HIGH (ref 0.44–1.00)
GFR calc Af Amer: 30 mL/min — ABNORMAL LOW (ref >60)
GFR calc Af Amer: 25 mL/min — ABNORMAL LOW (ref >60)
GFR calc non Af Amer: 26 mL/min — ABNORMAL LOW (ref >60)
GFR calc non Af Amer: 22 mL/min — ABNORMAL LOW (ref >60)
Glucose, Bld: 302 mg/dL — ABNORMAL HIGH (ref 70–99)
Glucose, Bld: 223 mg/dL — ABNORMAL HIGH (ref 70–99)
Phosphorus: 5.2 mg/dL — ABNORMAL HIGH (ref 2.5–4.6)
Phosphorus: 5.9 mg/dL — ABNORMAL HIGH (ref 2.5–4.6)
Potassium: 4.6 mmol/L (ref 3.5–5.1)
Potassium: 4.9 mmol/L (ref 3.5–5.1)
Sodium: 135 mmol/L (ref 135–145)
Sodium: 134 mmol/L — ABNORMAL LOW (ref 135–145)

## 2019-10-01 LAB — URINALYSIS, ROUTINE W REFLEX MICROSCOPIC
Bilirubin Urine: NEGATIVE
Glucose, UA: NEGATIVE mg/dL
Ketones, ur: NEGATIVE mg/dL
Nitrite: NEGATIVE
Protein, ur: >=300 mg/dL — AB
Specific Gravity, Urine: 1.011 (ref 1.005–1.030)
WBC, UA: >50 WBC/hpf — ABNORMAL HIGH (ref 0–5)
pH: 7 (ref 5.0–8.0)

## 2019-10-01 LAB — GLUCOSE, CAPILLARY
Glucose-Capillary: 150 mg/dL — ABNORMAL HIGH (ref 70–99)
Glucose-Capillary: 205 mg/dL — ABNORMAL HIGH (ref 70–99)
Glucose-Capillary: 219 mg/dL — ABNORMAL HIGH (ref 70–99)
Glucose-Capillary: 223 mg/dL — ABNORMAL HIGH (ref 70–99)
Glucose-Capillary: 254 mg/dL — ABNORMAL HIGH (ref 70–99)
Glucose-Capillary: 282 mg/dL — ABNORMAL HIGH (ref 70–99)
Glucose-Capillary: 318 mg/dL — ABNORMAL HIGH (ref 70–99)

## 2019-10-01 LAB — CBC
HCT: 30 % — ABNORMAL LOW (ref 36.0–46.0)
Hemoglobin: 9.1 g/dL — ABNORMAL LOW (ref 12.0–15.0)
MCH: 31.8 pg (ref 26.0–34.0)
MCHC: 30.3 g/dL (ref 30.0–36.0)
MCV: 104.9 fL — ABNORMAL HIGH (ref 80.0–100.0)
Platelets: 219 10*3/uL (ref 150–400)
RBC: 2.86 MIL/uL — ABNORMAL LOW (ref 3.87–5.11)
RDW: 13.7 % (ref 11.5–15.5)
WBC: 10.6 10*3/uL — ABNORMAL HIGH (ref 4.0–10.5)
nRBC: 0.9 % — ABNORMAL HIGH (ref 0.0–0.2)

## 2019-10-01 LAB — POCT I-STAT 7, (LYTES, BLD GAS, ICA,H+H)
Acid-Base Excess: 14 mmol/L — ABNORMAL HIGH (ref 0.0–2.0)
Bicarbonate: 43.5 mmol/L — ABNORMAL HIGH (ref 20.0–28.0)
Calcium, Ion: 1.27 mmol/L (ref 1.15–1.40)
HCT: 29 % — ABNORMAL LOW (ref 36.0–46.0)
Hemoglobin: 9.9 g/dL — ABNORMAL LOW (ref 12.0–15.0)
O2 Saturation: 97 %
Patient temperature: 96.9
Potassium: 4.7 mmol/L (ref 3.5–5.1)
Sodium: 134 mmol/L — ABNORMAL LOW (ref 135–145)
TCO2: 46 mmol/L — ABNORMAL HIGH (ref 22–32)
pCO2 arterial: 87.9 mmHg (ref 32.0–48.0)
pH, Arterial: 7.298 — ABNORMAL LOW (ref 7.350–7.450)
pO2, Arterial: 102 mmHg (ref 83.0–108.0)

## 2019-10-01 LAB — BLOOD GAS, ARTERIAL
Acid-Base Excess: 11.2 mmol/L — ABNORMAL HIGH (ref 0.0–2.0)
Bicarbonate: 38.9 mmol/L — ABNORMAL HIGH (ref 20.0–28.0)
Drawn by: 519031
FIO2: 44
O2 Saturation: 88.2 %
Patient temperature: 36.4
pCO2 arterial: 96.2 mmHg (ref 32.0–48.0)
pH, Arterial: 7.227 — ABNORMAL LOW (ref 7.350–7.450)
pO2, Arterial: 55.9 mmHg — ABNORMAL LOW (ref 83.0–108.0)

## 2019-10-01 MED ORDER — INSULIN DETEMIR 100 UNIT/ML ~~LOC~~ SOLN
15.0000 [IU] | Freq: Two times a day (BID) | SUBCUTANEOUS | Status: DC
  Administered 2019-10-01 – 2019-10-03 (×4): 15 [IU] via SUBCUTANEOUS
  Filled 2019-10-01 (×8): qty 0.15

## 2019-10-01 MED ORDER — SODIUM CHLORIDE 0.9% FLUSH
10.0000 mL | Freq: Two times a day (BID) | INTRAVENOUS | Status: DC
  Administered 2019-10-01: 40 mL
  Administered 2019-10-02 – 2019-10-04 (×4): 10 mL

## 2019-10-01 MED ORDER — BISACODYL 10 MG RE SUPP
10.0000 mg | Freq: Once | RECTAL | Status: AC
  Administered 2019-10-01: 10 mg via RECTAL
  Filled 2019-10-01: qty 1

## 2019-10-01 MED ORDER — CHLORHEXIDINE GLUCONATE CLOTH 2 % EX PADS: 6.0000 | MEDICATED_PAD | Freq: Every day | CUTANEOUS | Status: DC

## 2019-10-01 MED ORDER — ALTEPLASE 2 MG IJ SOLR
2.0000 mg | Freq: Once | INTRAMUSCULAR | Status: DC | PRN
  Filled 2019-10-01: qty 2

## 2019-10-01 MED ORDER — HEPARIN SODIUM (PORCINE) 1000 UNIT/ML DIALYSIS: 1000.0000 [IU] | INTRAMUSCULAR | Status: DC | PRN

## 2019-10-01 MED ORDER — INSULIN ASPART 100 UNIT/ML ~~LOC~~ SOLN
0.0000 [IU] | SUBCUTANEOUS | Status: DC
Start: 2019-10-01 — End: 2019-10-01

## 2019-10-01 MED ORDER — HEPARIN SODIUM (PORCINE) 1000 UNIT/ML DIALYSIS
1000.0000 [IU] | INTRAMUSCULAR | Status: DC | PRN
  Filled 2019-10-01: qty 3
  Filled 2019-10-01: qty 6

## 2019-10-01 MED ORDER — SODIUM CHLORIDE 0.9 % IV SOLN
250.0000 [IU]/h | INTRAVENOUS | Status: DC
  Administered 2019-10-01: 250 [IU]/h via INTRAVENOUS_CENTRAL
  Administered 2019-10-02: 650 [IU]/h via INTRAVENOUS_CENTRAL
  Administered 2019-10-02: 1000 [IU]/h via INTRAVENOUS_CENTRAL
  Filled 2019-10-01 (×4): qty 2

## 2019-10-01 MED ORDER — PRISMASOL BGK 4/2.5 32-4-2.5 MEQ/L REPLACEMENT SOLN
Status: DC
  Filled 2019-10-01 (×3): qty 5000

## 2019-10-01 MED ORDER — HEPARIN (PORCINE) 2000 UNITS/L FOR CRRT
INTRAVENOUS_CENTRAL | Status: DC | PRN
  Filled 2019-10-01 (×2): qty 1000

## 2019-10-01 MED ORDER — ORAL CARE MOUTH RINSE
15.0000 mL | Freq: Two times a day (BID) | OROMUCOSAL | Status: DC
  Administered 2019-10-02 – 2019-10-04 (×5): 15 mL via OROMUCOSAL

## 2019-10-01 MED ORDER — LIDOCAINE-PRILOCAINE 2.5-2.5 % EX CREA
1.0000 "application " | TOPICAL_CREAM | CUTANEOUS | Status: DC | PRN
  Filled 2019-10-01: qty 5

## 2019-10-01 MED ORDER — PRISMASOL BGK 4/2.5 32-4-2.5 MEQ/L REPLACEMENT SOLN
Status: DC
  Filled 2019-10-01 (×4): qty 5000

## 2019-10-01 MED ORDER — PRISMASOL BGK 4/2.5 32-4-2.5 MEQ/L IV SOLN
INTRAVENOUS | Status: DC
  Filled 2019-10-01 (×10): qty 5000

## 2019-10-01 MED ORDER — SODIUM CHLORIDE 0.9 % IV SOLN: 100.0000 mL | INTRAVENOUS | Status: DC | PRN

## 2019-10-01 MED ORDER — INSULIN ASPART 100 UNIT/ML ~~LOC~~ SOLN
0.0000 [IU] | SUBCUTANEOUS | Status: DC
  Administered 2019-10-01 – 2019-10-02 (×2): 3 [IU] via SUBCUTANEOUS
  Administered 2019-10-02: 2 [IU] via SUBCUTANEOUS
  Administered 2019-10-02 – 2019-10-03 (×2): 1 [IU] via SUBCUTANEOUS
  Administered 2019-10-03: 3 [IU] via SUBCUTANEOUS
  Administered 2019-10-03: 2 [IU] via SUBCUTANEOUS
  Administered 2019-10-03: 5 [IU] via SUBCUTANEOUS
  Administered 2019-10-03: 3 [IU] via SUBCUTANEOUS
  Administered 2019-10-04: 2 [IU] via SUBCUTANEOUS
  Administered 2019-10-04: 3 [IU] via SUBCUTANEOUS

## 2019-10-01 MED ORDER — PENTAFLUOROPROP-TETRAFLUOROETH EX AERO: 1.0000 "application " | INHALATION_SPRAY | CUTANEOUS | Status: DC | PRN

## 2019-10-01 MED ORDER — SODIUM CHLORIDE 0.9% FLUSH
10.0000 mL | INTRAVENOUS | Status: DC | PRN
  Administered 2019-10-02 – 2019-10-03 (×2): 10 mL

## 2019-10-01 MED ORDER — CHLORHEXIDINE GLUCONATE 0.12 % MT SOLN
15.0000 mL | Freq: Two times a day (BID) | OROMUCOSAL | Status: DC
  Administered 2019-10-01 – 2019-10-03 (×5): 15 mL via OROMUCOSAL
  Filled 2019-10-01 (×2): qty 15

## 2019-10-01 MED ORDER — HEPARIN BOLUS VIA INFUSION (CRRT)
1000.0000 [IU] | INTRAVENOUS | Status: DC | PRN
  Administered 2019-10-02: 1000 [IU] via INTRAVENOUS_CENTRAL
  Filled 2019-10-01: qty 1000

## 2019-10-01 MED ORDER — FUROSEMIDE 10 MG/ML IJ SOLN
160.0000 mg | Freq: Three times a day (TID) | INTRAVENOUS | Status: DC
  Administered 2019-10-01: 160 mg via INTRAVENOUS
  Filled 2019-10-01 (×3): qty 16

## 2019-10-01 MED ORDER — CHLORHEXIDINE GLUCONATE CLOTH 2 % EX PADS
6.0000 | MEDICATED_PAD | Freq: Every day | CUTANEOUS | Status: DC
  Administered 2019-10-02 – 2019-10-03 (×2): 6 via TOPICAL

## 2019-10-01 MED ORDER — LIDOCAINE HCL (PF) 1 % IJ SOLN: 5.0000 mL | INTRAMUSCULAR | Status: DC | PRN

## 2019-10-01 NOTE — Progress Notes (Signed)
Physical Therapy Treatment Patient Details Name: [name] MRN: [id number] DOB: [birth year] Today's Date: 10/01/2019    History of Present Illness Pt is a 79 y.o. yo female with diastolic CHF, CAD, DM, A. fib, CKD followed by Dr. Marisue Humble admitted with CHF exacerbation and hypoxia.    PT Comments    Pt admitted with above diagnosis. Pt was able to stand to [facility] to be moved to the recliner as she could not pivot with RW. Needed max assist of 2 overall for mobility. Pt had been given meds and daughter states that she has been less alert since that time. Desaturation to 84% on 6L with transfer but improved to 90% within 5 min in chair on 6L.  Pt glad to get to chair. Will follow acutely.  Pt currently with functional limitations due to the deficits listed below (see PT Problem List). Pt will benefit from skilled PT to increase their independence and safety with mobility to allow discharge to the venue listed below.     Follow Up Recommendations  Home health PT;Supervision/Assistance - 24 hour     Equipment Recommendations  None recommended by PT    Recommendations for Other Services       Precautions / Restrictions Precautions Precautions: Fall Restrictions Weight Bearing Restrictions: No    Mobility  Bed Mobility Overal bed mobility: Needs Assistance Bed Mobility: Supine to Sit     Supine to sit: Max assist;+2 for physical assistance;HOB elevated     General bed mobility comments: Pt needed assist for all aspects of bed mmobility as she was lethargic and difficult to keep aroused. Kept having to wake pt up.   Transfers Overall transfer level: Needs assistance Equipment used: Rolling walker (2 wheeled) Transfers: Sit to/from [employer] Sit to Stand: Mod assist;+2 physical assistance;From elevated surface Stand pivot transfers: +2 physical assistance;Total assist       General transfer comment: Attempted for pt to stand and pivot with RW. She  could power up to RW with mod assist but could not take pivotal steps with pt with knee buckling and unsteady on feet. Used STedy ultimately to have pt stand to it with mod assist and then moved pt to the recliner. Pt needed constant cues to stay awake.    Ambulation/Gait             General Gait Details: unable today   Stairs             Wheelchair Mobility    Modified Rankin (Stroke Patients Only)       Balance Overall balance assessment: Needs assistance Sitting-balance support: Bilateral upper extremity supported;Feet supported Sitting balance-Leahy Scale: Poor Sitting balance - Comments: Needed bil UE support, constant cues and mod to max assist as pt kept dozing off and needing assist to sit upright. Pt leaning to her left and posterior needing constant support.    Standing balance support: Bilateral upper extremity supported;During functional activity Standing balance-Leahy Scale: Poor Standing balance comment: relies on UE and external support.                             Cognition Arousal/Alertness: Lethargic;Suspect due to medications Behavior During Therapy: Flat affect Overall Cognitive Status: Within Functional Limits for tasks assessed  Exercises General Exercises - Lower Extremity Long Arc Quad: AROM;Both;5 reps;Seated    General Comments General comments (skin integrity, edema, etc.): Pt on 6LO2 and sats 84% after transfer to chair.  Recovered to 90% after 5 min inchair.       Pertinent Vitals/Pain Pain Assessment: Faces Faces Pain Scale: Hurts even more Pain Location: back, generalized Pain Descriptors / Indicators: Constant;Aching Pain Intervention(s): Limited activity within patient's tolerance;Monitored during session;Repositioned    Home Living Family/patient expects to be discharged to:: Private residence Living Arrangements: Children(daughter [name]) Available Help at  Discharge: Family;Personal care attendant;Available 24 hours/day(24 hour caregiver) Type of Home: House Home Access: Stairs to enter   Home Layout: One level Home Equipment: Environmental consultant - 2 wheels;Hospital bed;Shower seat;Wheelchair - manual;Bedside commode;Grab bars - tub/shower;Hand held shower head(2Lo2 at home)      Prior Function Level of Independence: Needs assistance  Gait / Transfers Assistance Needed: rw and min guard A for household ambulation ADL's / Homemaking Assistance Needed: max A with UB/LB bathing and dressing. assist with feeding, grooming and bed mobility.      PT Goals (current goals can now be found in the care plan section) Acute Rehab PT Goals Patient Stated Goal: feel better, walk PT Goal Formulation: With patient Time For Goal Achievement: 10/15/19 Potential to Achieve Goals: Good    Frequency    Min 3X/week      PT Plan      Co-evaluation              AM-PAC PT "6 Clicks" Mobility   Outcome Measure  Help needed turning from your back to your side while in a flat bed without using bedrails?: A Lot Help needed moving from lying on your back to sitting on the side of a flat bed without using bedrails?: A Lot Help needed moving to and from a bed to a chair (including a wheelchair)?: A Lot Help needed standing up from a chair using your arms (e.g., wheelchair or bedside chair)?: A Lot Help needed to walk in hospital room?: Total Help needed climbing 3-5 steps with a railing? : Total 6 Click Score: 10    End of Session Equipment Utilized During Treatment: Gait belt;Oxygen Activity Tolerance: Patient limited by fatigue Patient left: in chair;with call bell/phone within reach;with family/visitor present Nurse Communication: Mobility status PT Visit Diagnosis: Unsteadiness on feet (R26.81);Other abnormalities of gait and mobility (R26.89);Difficulty in walking, not elsewhere classified (R26.2)     Time: 6045-4098 PT Time Calculation (min) (ACUTE  ONLY): 36 min  Charges:  $Therapeutic Activity: 8-22 mins                     Katalyn Matin W,PT Acute Rehabilitation Services Pager:  [phone]  Office:  [place] 10/01/2019, 1:44 PM

## 2019-10-01 NOTE — Progress Notes (Signed)
Patient transferred in her to 56M room 15 without difficulty report given to receiving nurse. Patient placed on Bipap and warming blanket without difficulty.

## 2019-10-01 NOTE — Progress Notes (Signed)
   10/01/19 1114  Assess: MEWS Score  BP 138/75  Pulse Rate 78  Resp 16  SpO2 94 %  O2 Device Nasal Cannula  O2 Flow Rate (L/min) 6 L/min  Assess: MEWS Score  MEWS Temp 2  MEWS Systolic 0  MEWS Pulse 0  MEWS RR 0  MEWS LOC 0  MEWS Score 2  MEWS Score Color Yellow  Assess: if the MEWS score is Yellow or Red  Were vital signs taken at a resting state? Yes  Focused Assessment Documented focused assessment  Early Detection of Sepsis Score *See Row Information* Low  MEWS guidelines implemented *See Row Information* No, previously yellow, continue vital signs every 4 hours  Treat  MEWS Interventions Other (Comment) (cont frequent vs.)  Take Vital Signs  Increase Vital Sign Frequency  Yellow: Q 2hr X 2 then Q 4hr X 2, if remains yellow, continue Q 4hrs  Escalate  MEWS: Escalate Yellow: discuss with charge nurse/RN and consider discussing with provider and RRT (charge nurse aware frequent vs)  Notify: Charge Nurse/RN  Name of Charge Nurse/RN Notified  Sharla Kidney Aware.)  Date Charge Nurse/RN Notified 10/01/19  Notify: Provider  Provider Name/Title  (Dr.Krishan aware no new interventions.)  Date Provider Notified 10/01/19  Time Provider Notified 0930  Notification Type Page  Response See new orders  Date of Provider Response 10/01/19  Time of Provider Response 0930  Notify: Rapid Response  Name of Rapid Response RN Notified  (not this time.)  Document  Patient Outcome Other (Comment) (Bear Hugger conts.)  Progress note created (see row info) Yes  Bear Hugger applied to patient,temp still not changed will recheck after blanket been on awhile not been applied to patient very long.

## 2019-10-01 NOTE — Plan of Care (Signed)

## 2019-10-01 NOTE — Progress Notes (Signed)
This note also relates to the following rows which could not be included: ECG Heart Rate - Cannot attach notes to unvalidated device data Resp - Cannot attach notes to unvalidated device data   warming blanket applied. Patient very sedated from pain meds this am MD felt like body temp dropped with opoid use treat chronic pain c/o's. VS frequency increased no further interventions. Patient o2 needs increased after pain meds as her sats dropped after pain meds given to patient. So MD wants just observe her further see if she improves as the pain meds wear off.

## 2019-10-01 NOTE — Progress Notes (Signed)
CRITICAL VALUE ALERT  Critical Value:  abg cO2 96     Date & Time Notied: 10/01/2019@1739   Provider Notified: Dr.Krishnan,Gokul   Orders Received/Actions taken: Bipap place patient on bring co2 down.

## 2019-10-01 NOTE — Procedures (Signed)
Hemodialysis Catheter Insertion Procedure Note [name] [id number] 1940-06-09  Procedure: Insertion of Hemodialysis Catheter Indications: Dialysis Access   Procedure Details Consent: Risks of procedure as well as the alternatives and risks of each were explained to the (patient/caregiver).  Consent for procedure obtained. Time Out: Verified patient identification, verified procedure, site/side was marked, verified correct patient position, special equipment/implants available, medications/allergies/relevent history reviewed, required imaging and test results available.  Performed  Maximum sterile technique was used including antiseptics, cap, gloves, gown, hand hygiene, mask and sheet. Skin prep: Chlorhexidine; local anesthetic administered Triple lumen hemodialysis catheter was inserted into right internal jugular vein using the Seldinger technique.  Evaluation Blood flow good Complications: No apparent complications Patient did tolerate procedure well. Chest X-ray ordered to verify placement.  CXR: pending.   Ultrasound was used       [name], AGACNP-BC [facility]  See Amion for personal pager PCCM on call pager [phone]  10/01/2019 8:27 PM

## 2019-10-01 NOTE — Evaluation (Signed)
Occupational Therapy Evaluation Patient Details Name: [name] MRN: [id number] DOB: 1940-12-09 Today's Date: 10/01/2019    History of Present Illness Pt is a 79 y.o. yo female with diastolic CHF, CAD, DM, A. fib, CKD followed by Dr. Marisue Humble admitted with CHF exacerbation and hypoxia.   Clinical Impression   Pt PTA: Pt recently discharged from hospital and sent home with [facility] and home O2 on 2L Opp. Pt has 24/7 assist at home between family and caregivers. Pt currently limited by fatigue and medication overload.  Pt currently with functional limitiations due to the deficits in poor activity tolerance, decreased strength and decreased ability to care for self. Pt maxA to totalA for ADL and mAxA overall with stedy for sit to stands and transfers. OT will continue to assess cognition as pt was lucid prior to this. Pt will benefit from skilled OT to increase their independence and safety with ADL and balance to allow discharge home with HHOT. OT following acutely.  O2 on 6L O2 >90%; HR 98 BPM with exertion.     Follow Up Recommendations  Home health OT;Supervision/Assistance - 24 hour    Equipment Recommendations  None recommended by OT    Recommendations for Other Services       Precautions / Restrictions Precautions Precautions: Fall Restrictions Weight Bearing Restrictions: No      Mobility Bed Mobility Overal bed mobility: Needs Assistance Bed Mobility: Supine to Sit     Supine to sit: Max assist;+2 for physical assistance;HOB elevated     General bed mobility comments: in recliner pre and post session  Transfers Overall transfer level: Needs assistance Equipment used: Ambulation equipment used Transfers: Sit to/from Stand Sit to Stand: Max assist Stand pivot transfers: +2 physical assistance;Total assist       General transfer comment: Pt maxA for power up from front of stedy. Mod to maxA overall    Balance Overall balance assessment: Needs  assistance Sitting-balance support: Bilateral upper extremity supported;Feet supported Sitting balance-Leahy Scale: Poor Sitting balance - Comments: very fatigued and leaning to L Postural control: Left lateral lean Standing balance support: Bilateral upper extremity supported;During functional activity Standing balance-Leahy Scale: Poor Standing balance comment: relies on UE and external support.                            ADL either performed or assessed with clinical judgement   ADL Overall ADL's : Needs assistance/impaired Eating/Feeding: Maximal assistance;Sitting   Grooming: Maximal assistance;Sitting   Upper Body Bathing: Maximal assistance;Sitting   Lower Body Bathing: Total assistance;Sitting/lateral leans;Sit to/from stand   Upper Body Dressing : Total assistance;Sitting   Lower Body Dressing: Total assistance;Sitting/lateral leans;Sit to/from stand   Toilet Transfer: Maximal assistance;Cueing for Chief of Staff Details (indicate cue type and reason): use of stedy Toileting- Clothing Manipulation and Hygiene: Total assistance;Sitting/lateral lean;Sit to/from stand       Functional mobility during ADLs: Maximal assistance(using stedy) General ADL Comments: Up in recliner, focus on functional mobility to complete ADLs, fatigued quickly in session     Vision Baseline Vision/History: Wears glasses Wears Glasses: At all times Patient Visual Report: No change from baseline Vision Assessment?: No apparent visual deficits     Perception     Praxis      Pertinent Vitals/Pain Pain Assessment: Faces Faces Pain Scale: Hurts a little bit Pain Location: back, generalized Pain Descriptors / Indicators: Constant Pain Intervention(s): Limited activity within patient's tolerance;Repositioned;Premedicated before session;Monitored during session;Heat applied  Hand Dominance Left   Extremity/Trunk Assessment Upper Extremity Assessment Upper  Extremity Assessment: Defer to OT evaluation   Lower Extremity Assessment Lower Extremity Assessment: Generalized weakness   Cervical / Trunk Assessment Cervical / Trunk Assessment: Kyphotic   Communication Communication Communication: No difficulties   Cognition Arousal/Alertness: Lethargic;Suspect due to medications Behavior During Therapy: Flat affect Overall Cognitive Status: Within Functional Limits for tasks assessed                                 General Comments: Pt awoken more throughout session with max cues and movement   General Comments  Pt Os  >90% on 6L O2 Hiko    Exercises General Exercises - Lower Extremity Long Arc Quad: AROM;Both;5 reps;Seated   Shoulder Instructions      Home Living Family/patient expects to be discharged to:: Private residence Living Arrangements: Children(daughter [name]) Available Help at Discharge: Family;Personal care attendant;Available 24 hours/day Type of Home: House Home Access: Stairs to enter [employer] of Steps: 1   Home Layout: One level     Bathroom Shower/Tub: Occupational psychologist: Handicapped height     Home Equipment: Environmental consultant - 2 wheels;Hospital bed;Shower seat;Wheelchair - manual;Bedside commode;Grab bars - tub/shower;Hand held shower head;Other (comment)(lift chair; 2L O2 at home)          Prior Functioning/Environment Level of Independence: Needs assistance  Gait / Transfers Assistance Needed: rw and min guard A for household ambulation ADL's / Homemaking Assistance Needed: max A with UB/LB bathing and dressing. assist with feeding, grooming and bed mobility             OT Problem List: Decreased strength;Decreased activity tolerance;Impaired balance (sitting and/or standing);Decreased coordination;Decreased knowledge of use of DME or AE;Decreased knowledge of precautions;Cardiopulmonary status limiting activity;Obesity;Impaired UE functional use;Pain      OT  Treatment/Interventions: Self-care/ADL training;Therapeutic exercise;DME and/or AE instruction;Energy conservation;Therapeutic activities;Patient/family education;Balance training    OT Goals(Current goals can be found in the care plan section) Acute Rehab OT Goals Patient Stated Goal: feel better, walk OT Goal Formulation: With patient Time For Goal Achievement: 10/15/19 Potential to Achieve Goals: Good ADL Goals Pt Will Transfer to Toilet: with min assist;ambulating Pt/caregiver will Perform Home Exercise Program: Increased strength;Both right and left upper extremity;With Supervision Additional ADL Goal #1: Pt will maintain O2 sats >90% during functional ADL and mobility tasks. Additional ADL Goal #2: pt will complete x10 mins ADL with 1 rest break in order to increase independence with ADL and return to PLOF.  OT Frequency: Min 2X/week   Barriers to D/C:            Co-evaluation              AM-PAC OT "6 Clicks" Daily Activity     Outcome Measure Help from another person eating meals?: A Lot Help from another person taking care of personal grooming?: A Lot Help from another person toileting, which includes using toliet, bedpan, or urinal?: Total Help from another person bathing (including washing, rinsing, drying)?: Total Help from another person to put on and taking off regular upper body clothing?: A Lot Help from another person to put on and taking off regular lower body clothing?: Total 6 Click Score: 9   End of Session Equipment Utilized During Treatment: Gait belt;Oxygen Nurse Communication: Mobility status  Activity Tolerance: Patient tolerated treatment well Patient left: in chair;with call bell/phone within reach;with family/visitor present  OT Visit Diagnosis: Unsteadiness on feet (R26.81);Muscle weakness (generalized) (M62.81);Pain Pain - part of body: ("all over")                Time: 1222-1300 OT Time Calculation (min): 38 min Charges:  OT General  Charges $OT Visit: 1 Visit OT Evaluation $OT Eval Moderate Complexity: 1 Mod OT Treatments $Self Care/Home Management : 8-22 mins $Therapeutic Activity: 8-22 mins  Flora Lipps, OTR/L Acute Rehabilitation Services Pager: [phone] Office: [phone]   Sohan Potvin C 10/01/2019, 3:50 PM

## 2019-10-01 NOTE — Progress Notes (Signed)
Nephrology follow up note: Remains lethargic, hypoxic and no increase in urine ouput. ABG: pco2 96.2 and p02 55.9. Lethargy is due to co2 narcosis.  Plan to move to ICU, PCCM consult, ? bipap. Given fluid overload and refractory to IV diuretics, plan to initiate dialysis after catheter placement. HD ordered. D/w Dr Ferne Reus, HD nurse.   Eddie North, MD [facility].

## 2019-10-01 NOTE — Progress Notes (Signed)
Orders received transfer patient to [facility],patient to have temp dialysis line place and have dialysis tonight. Family at bedside aware of transfer MD explained plan of care they agreed.

## 2019-10-01 NOTE — Progress Notes (Signed)
RT NOTES: ABG obtained and sent to lab. Lab tech Brian notified.  

## 2019-10-01 NOTE — Progress Notes (Signed)
RT NOTES: Patient placed on bipap per ABG and orders. Patient awaiting ICU bed at this time.

## 2019-10-01 NOTE — Progress Notes (Signed)
Pt transferred to ICU due to acute hypercarbic and hypoxic respiratory failure with altered mental status and volume overload.  Not responding to high dose iV lasix.  Plan for CRRT following HD catheter placement by PCCM.

## 2019-10-01 NOTE — Progress Notes (Addendum)
Attending note: I have seen and examined the patient. History, labs and imaging reviewed.  79 year old with CKD, CHF, progressive chronic renal failure admitted with hypercarbic respiratory failure, volume overload, pulmonary edema.  PCCM consulted for BiPAP, transferred to ICU for dialysis. History notable for rheumatoid arthritis, chronic right effusion which was sampled at [facility] in February 2021 with negative cytology.  Patient was told that the effusion was secondary to her rheumatoid arthritis  Blood pressure (!) 128/101, pulse 69, temperature 97.6 F (36.4 C), resp. rate 16, height 4\' 11"  (1.499 m), weight 78.1 kg, SpO2 (!) 76 %. Gen:      Frail, elderly HEENT:  EOMI, sclera anicteric Neck:     No masses; no thyromegaly Lungs:    Clear to auscultation bilaterally; normal respiratory effort CV:         Regular rate and rhythm; no murmurs Abd:      + bowel sounds; soft, non-tender; no palpable masses, no distension Ext:    1-2+ edema; adequate peripheral perfusion Skin:      Warm and dry; no rash Neuro: Somnolent, arousable  Labs/Imaging personally reviewed, significant for ABG 7.2 3/96/56/88% BUN/creatinine 38/1.86, WBC 8.1, hemoglobin 9.1, platelets 215 Chest x-ray 10/01/2019-stable loculated right effusion, bibasal lung consolidation.  Assessment/plan: Acute on chronic hypoxic, hypercarbic respiratory failure secondary to pulmonary edema Start BiPAP, monitor respiratory status Follow chest x-ray, ABG  Acute on chronic kidney disease Urine output has dropped off on Lasix Transfer to ICU for HD catheter placement and initiation of CRRT  Acute on chronic diastolic heart failure, pulmonary edema Paroxysmal atrial fibrillation Order echocardiogram  History of rheumatoid arthritis, Sjogren's On chronic steroids  Diabetes Continue Levemir, insulin  Goals of care Discussed with son and daughter.  Patient was previously DNR but recently asked to be full code.  If she  deteriorates and gets on the ventilator then it will be reasonable to give her a time-limited trial of a few days.  If no improvement or in case of deterioration then consider comfort care.  Family is in agreement with this and stated clearly that they would not want long-term support, tracheostomy etc.  The patient is critically ill with multiple organ systems failure and requires high complexity decision making for assessment and support, frequent evaluation and titration of therapies, application of advanced monitoring technologies and extensive interpretation of multiple databases.  Critical care time - 35 mins. This represents my time independent of the NPs time taking care of the pt.  10/03/2019 MD [facility] 10/01/2019, 6:16 PM

## 2019-10-01 NOTE — Progress Notes (Addendum)
[facility] NEPHROLOGY PROGRESS NOTE  Assessment/ Plan: Pt is a 79 y.o. yo female with diastolic CHF, CAD, DM, A. fib, CKD followed by Dr. Joelyn Oms admitted with CHF exacerbation and hypoxia.  #Acute on chronic CHF exacerbation/pulmonary edema: Patient has recurring issue with fluid overload.  Recently seen at [facility] and the dose of torsemide was increased and added metolazone.  Currently on Lasix 120 mg every 8 and metolazone with minimal urine output.  She was on 3 L of oxygen however required 6 L today after receiving opiates and becoming lethargic.  Recommend to discontinue narcotics.  She is now waking up. Plan to continue IV diuretics and metolazone today.  Increase Lasix to 180 mg every 8.  If no improvement in urine output by tomorrow then we may need to pursue hemodialysis for diuretics refractory volume overload.  I have discussed this with the patient and her daughter who agreed with the plan.  #CKD with fluid overload: Creatinine level seems to be around at baseline.  She received 1 session of dialysis in 08/2019 for uremia.  Kidney ultrasound with bilateral cortical thinning without hydronephrosis.  I will order bladder scan.  Strict ins and outs.   #Acute hypoxic respiratory failure: She has chronic pleural effusion reportedly it is a stable.  Continue oxygen and diuretics as above.   #Anemia of CKD: Iron saturation 14% therefore receiving IV iron.  Monitor hemoglobin.  #Hypertension: Continue amlodipine.  Monitor blood pressure.  #History of A. fib: Monitor heart rate.  Not on anticoagulation.  #Discussed with the patient's daughter and primary team.  Subjective: Seen and examined at bedside.  Sitting on bed and trying to work with PT.  Reportedly more alert than this morning.  Not really oriented at the moment.  Daughter at bedside.  Urine output only 875 cc. Objective Vital signs in last 24 hours: Vitals:   10/01/19 0441 10/01/19 0500 10/01/19 0833  10/01/19 0900  BP: (!) 132/51  (!) 141/69   Pulse: (!) 53     Resp: 17 19 20    Temp: 98.9 F (37.2 C)   (!) 94.4 F (34.7 C)  TempSrc: Oral   Rectal  SpO2: 90% 92%    Weight:      Height:       Weight change: -4.3 kg  Intake/Output Summary (Last 24 hours) at 10/01/2019 1058 Last data filed at 10/01/2019 1000 Gross per 24 hour  Intake 630 ml  Output 875 ml  Net -245 ml       Labs: Basic Metabolic Panel: Recent Labs  Lab 09/29/19 0857 09/30/19 0655 10/01/19 0446  NA 141 137 135  K 3.3* 3.8 4.6  CL 92* 92* 89*  CO2 35* 33* 31  GLUCOSE 167* 252* 302*  BUN 68* 73* 78*  CREATININE 1.55* 1.61* 1.86*  CALCIUM 9.1 9.1 9.3  PHOS  --  4.7* 5.2*   Liver Function Tests: Recent Labs  Lab 09/29/19 0857 09/30/19 0655 10/01/19 0446  AST 41  --   --   ALT 38  --   --   ALKPHOS 224*  --   --   BILITOT 0.8  --   --   PROT 6.0*  --   --   ALBUMIN 2.8* 2.8* 2.9*   No results for input(s): LIPASE, AMYLASE in the last 168 hours. No results for input(s): AMMONIA in the last 168 hours. CBC: Recent Labs  Lab 10/20/2019 1751 09/29/19 0857 09/30/19 0655  WBC 9.1 7.4 8.1  NEUTROABS  --  5.8  --   HGB 9.9* 9.3* 9.1*  HCT 32.2* 31.0* 30.2*  MCV 103.5* 104.0* 104.5*  PLT 266 223 215   Cardiac Enzymes: No results for input(s): CKTOTAL, CKMB, CKMBINDEX, TROPONINI in the last 168 hours. CBG: Recent Labs  Lab 09/30/19 1200 09/30/19 1632 09/30/19 2117 10/01/19 0608 10/01/19 0920  GLUCAP 299* 319* 308* 318* 282*    Iron Studies:  Recent Labs    09/30/19 0822  IRON 40  TIBC 277  FERRITIN 95   Studies/Results: DG CHEST PORT 1 VIEW  Result Date: 10/01/2019 CLINICAL DATA:  Dyspnea EXAM: PORTABLE CHEST 1 VIEW COMPARISON:  09/11/2019 chest radiograph. FINDINGS: Stable cardiomediastinal silhouette with mild cardiomegaly. No pneumothorax. Loculated moderate sub pulmonic right pleural effusion is unchanged. Small left pleural effusion. Diffuse hazy and linear parahilar lung  opacities with bibasilar lung consolidation, air bronchograms and volume loss. IMPRESSION: 1. Stable loculated moderate subpulmonic right pleural effusion. Stable small left pleural effusion. 2. Stable cardiomegaly with diffuse hazy and linear parahilar lung opacities, favor pulmonary edema. 3. Bibasilar lung consolidation with air bronchograms, which could represent atelectasis, aspiration or pneumonia. Electronically Signed   By: Delbert Phenix M.D.   On: 10/01/2019 09:47   DG Abd Portable 1V  Result Date: 10/01/2019 CLINICAL DATA:  Abdominal pain EXAM: PORTABLE ABDOMEN - 1 VIEW COMPARISON:  08/21/2019 CT abdomen/pelvis FINDINGS: No disproportionately dilated small bowel loops. No evidence of pneumatosis or pneumoperitoneum. Cholecystectomy clips are seen in the right upper quadrant of the abdomen. Abdominal aortic atherosclerosis. Moderate lumbar spondylosis. No radiopaque nephrolithiasis. Patchy bibasilar lung opacities. IMPRESSION: Nonobstructive bowel gas pattern. Electronically Signed   By: Delbert Phenix M.D.   On: 10/01/2019 09:48    Medications: Infusions: . sodium chloride 10 mL/hr at 09/29/19 1713  . ferric gluconate (FERRLECIT/NULECIT) IV 250 mg (10/01/19 0912)  . furosemide 120 mg (10/01/19 0500)    Scheduled Medications: . amLODipine  10 mg Oral Daily  . aspirin EC  81 mg Oral Daily  . bisacodyl  10 mg Rectal Once  . cholecalciferol  2,000 Units Oral Daily  . cycloSPORINE  1 drop Both Eyes BID  . enoxaparin (LOVENOX) injection  30 mg Subcutaneous Q24H  . gabapentin  200 mg Oral BID  . insulin aspart  0-9 Units Subcutaneous TID WC  . insulin detemir  15 Units Subcutaneous BID  . levothyroxine  88 mcg Oral Q0600  . lidocaine  1 patch Transdermal Q24H  . linaclotide  145 mcg Oral Daily  . loratadine  10 mg Oral Daily  . metolazone  5 mg Oral Daily  . multivitamin with minerals  1 tablet Oral Daily  . oxyCODONE  10 mg Oral Q12H  . pantoprazole  40 mg Oral Daily  . polyethylene  glycol  17 g Oral BID  . predniSONE  5 mg Oral Q breakfast  . spironolactone  25 mg Oral Daily    have reviewed scheduled and prn medications.  Physical Exam: General:NAD, comfortable Heart:RRR, s1s2 nl, no rubs Lungs: Bilateral coarse breath sound, no increased work of breathing Abdomen:soft, Non-tender, non-distended Extremities: Trace LE edema Neurology: Alert, awake, following simple commands.  No asterixis noted.  [name] [name] 10/01/2019,10:58 AM  LOS: 3 days  Pager: [phone]
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
## 2019-10-01 NOTE — Progress Notes (Addendum)
[facility] PROGRESS NOTE   [name] JJH:[id number] DOB: 06-Jul-1940 DOA: 09/15/2019  PCP: Buckner Malta, MD  Brief History/Interval Summary: [name] is a 79 y.o. female with history of chronic diastolic CHF, chronic kidney disease stage III, CAD, diabetes mellitus type 2, paroxysmal atrial fibrillation, admitted and discharged 2 weeks ago for acute renal failure.  It appears like she was hydrated during her last hospitalization.  She was discharged on May 15. Prior to that patient also admitted 2 months ago for acute renal failure and fluid overload required 1 session of dialysis.  She presented to the ED because of worsening shortness of breath and peripheral edema.  Patient states since discharge she was doing fine over the last 1 week her shortness of breath are worsening mostly on exertion and increasing peripheral edema with patient gaining 10 pounds.  Had gone to her nephrologist and her torsemide dose was increased 200 mg twice daily.  Despite taking which patient was still retaining fluid.    Chest x-ray in the ED showed pulmonary edema and bilateral pleural effusions.  Patient was hospitalized for further management.  Reason for Visit: Fluid overload in the setting of chronic kidney disease  Consultants: Nephrology  Procedures: None yet  Antibiotics: Anti-infectives (From admission, onward)   None      Subjective/Interval History: Patient noted to be very somnolent this morning.  Apparently she received her OxyContin along with the short acting oxycodone.  She is arousable.  She complains of fullness in her abdomen.  She also complains that she has not had a bowel movement.  Shortness of breath still persist.  Denies any chest pain.  Her daughter is at the bedside.  ROS: Denies any nausea or vomiting.    Assessment/Plan:  Fluid overload in the setting of chronic kidney disease stage IIIb and acute on chronic diastolic CHF/acute  respiratory failure with hypoxia There is likely a component of acute diastolic CHF along with primarily a renal etiology for her fluid overload.  She was seen by her nephrologist on Monday, May 24, and her dose of diuretics were increased.  Despite this patient presented with overt fluid overload.   This presentation could be suggestive of for progressive kidney disease.  There is an echocardiogram scanned in from [place] under chart review.  This was done in January 2021.  Systolic function was noted to be normal.  Mild LVH was noted. She tells me that she was discharged on home oxygen at 2 L/min the last time she was in the hospital. Patient has been on high-dose intravenous furosemide.  Patient has not diuresed much.  Same.  Nephrology is managing her diuretics.  Creatinine has also gone up.  BUN is also higher.   Will defer to nephrology.   Continue to monitor ins and outs, daily weights.  Renal function.  Correct electrolytes.  Mobilize.  PT and OT.  Out of bed to chair. Noted to be hypoxic on 3 L at 82%.  She was somnolent.  This is all likely due to the narcotics that she received this morning.  No clear indication for Narcan at this time as the patient does respond appropriately.  We will allow the medication to wear off on its own.  Oxygen was bumped up to 6 L with improvement in saturation to 91%.  Patient is a little bit more responsive. We will repeat chest x-ray.  Chest x-ray shows persistent pulmonary edema.  Complex pleural effusion noted on the right which  is thought to be stable compared to previous.  ADDENDUM Patient reevaluated in the afternoon.  She is more drowsy than she was this morning.  She is noted to be sitting up in the chair.  She apparently worked with PT and OT a few hours ago.  Still responds.  However goes right back to sleep.  Saturations are in the 90 to 95% range on 6 L.  Proceed with ABG for now.  We will discontinue her long-acting OxyContin as well.  Reason for her  encephalopathy is likely multifactorial including narcotics, renal failure.  Could also have hypercapnia.  Discussed her status with Dr. Ronalee Belts with nephrology.  We both feel that patient may benefit from dialysis tonight rather than waiting till tomorrow.  Discussed with family member and they agree.  In the meantime ABG results are available.  Patient has acute respiratory acidosis with a PCO2 of 96.  pH is 7.22.  Patient is still responsive but drowsy.  May benefit from BiPAP.  Discussed with critical care medicine.  Will transfer to ICU.  They will also place dialysis catheter.  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  According to the daughter thoracentesis was attempted in [facility] earlier this year and they said that future thoracentesis may not be possible.  Pleural effusion thought to be due to her rheumatoid arthritis.  She has rheumatoid nodules in that area.  These were seen on CT angiogram done in April.  Abdominal pain, unspecified Will do an abdominal film.  She had a CT scan in April which did not show any abnormal findings in the abdomen.  She is mildly tender in the epigastric area.  She has been constipated for 3 days.  All of this could be contributing.  Laxatives and lenses on board.  May need to use suppository or enemas.  History of paroxysmal atrial fibrillation/bradycardia with pauses Patient noted to be bradycardic at nighttime when she is sleeping.  Based on previous records this is known to happen in this patient.  She is not on any AV blocking agents.  She was on clonidine during her previous hospitalization but that was discontinued.  This is all likely exacerbated due to her fluid overload.  Patient remains asymptomatic from a cardiac standpoint.  Telemetry was reviewed again.  Bradycardic episodes while she is sleeping but otherwise stable.  Not as many pauses as before. Continue to keep  her potassium greater than 4.  Magnesium being checked as well.    Clarified anticoagulation with patient and family.  She was never on anticoagulation for her atrial fibrillation.  She only takes aspirin.  Will defer this to her outpatient providers.   Constipation Patient on MiraLAX and Linzess at home.  Patient has not had a bowel movement in 3 to 4 days.  Abdominal x-ray does not show any obstruction.  We will order suppository.  Anemia of chronic kidney disease Hemoglobin to be monitored closely.  No evidence of overt bleeding.  History of rheumatoid arthritis and Sjogren's disease On steroids chronically.  Continue with prednisone.  Chronic venous stasis lower extremities She has chronic hyperpigmentation.  She does have some pitting edema bilateral lower extremities.  Diabetes mellitus type 2, uncontrolled with hyperglycemia Patient on insulin at home.  No recent changes to her dosing.  She tells me that previously her blood sugars used to run between 100-200.  Lately they have been running 70-90.  Progression of her kidney  disease could be the reason for her lower insulin requirements.   Insulin was initially held.  Insulin then running consistently in the 200s.  She was started on Levemir.  CBGs are poorly controlled.  Will increase Levemir to twice a day.  Holding her 70/30 insulin which she takes at home.  HbA1c was 8.5 in May 11.    Essential hypertension Blood pressure is noted to be running high occasionally.  Currently on amlodipine at 10 mg daily.  May need to add additional agents although her bradycardia and CKD limits our choices.  Hypothyroidism Continue levothyroxine  Pressure injury, present on admission Pressure Injury 09/11/19 Sacrum Stage 1 -  Intact skin with non-blanchable redness of a localized area usually over a bony prominence. pink (Active)  09/11/19 1750  Location: Sacrum  Location Orientation:   Staging: Stage 1 -  Intact skin with non-blanchable  redness of a localized area usually over a bony prominence.  Wound Description (Comments): pink  Present on Admission: Yes    Obesity Estimated body mass index is 34.78 kg/m as calculated from the following:   Height as of this encounter: 4\' 11"  (1.499 m).   Weight as of this encounter: 78.1 kg.   DVT Prophylaxis: Lovenox Code Status: Full code.  CODE STATUS verified with patient and daughter. Family Communication: Discussed with patient and her daughter. Disposition Plan:  Status is: Inpatient  Remains inpatient appropriate because:IV treatments appropriate due to intensity of illness or inability to take PO   Dispo: The patient is from: Home              Anticipated d/c is to: Home              Anticipated d/c date is: > 3 days              Patient currently is not medically stable to d/c.      Medications:  Scheduled: . amLODipine  10 mg Oral Daily  . aspirin EC  81 mg Oral Daily  . cholecalciferol  2,000 Units Oral Daily  . cycloSPORINE  1 drop Both Eyes BID  . enoxaparin (LOVENOX) injection  30 mg Subcutaneous Q24H  . gabapentin  200 mg Oral BID  . insulin aspart  0-9 Units Subcutaneous TID WC  . insulin detemir  15 Units Subcutaneous BID  . levothyroxine  88 mcg Oral Q0600  . lidocaine  1 patch Transdermal Q24H  . linaclotide  145 mcg Oral Daily  . loratadine  10 mg Oral Daily  . metolazone  5 mg Oral Daily  . multivitamin with minerals  1 tablet Oral Daily  . oxyCODONE  10 mg Oral Q12H  . pantoprazole  40 mg Oral Daily  . polyethylene glycol  17 g Oral BID  . predniSONE  5 mg Oral Q breakfast  . spironolactone  25 mg Oral Daily   Continuous: . sodium chloride 10 mL/hr at 09/29/19 1713  . ferric gluconate (FERRLECIT/NULECIT) IV 250 mg (10/01/19 0912)  . furosemide 120 mg (10/01/19 0500)   RDE:YCXKGY chloride, acetaminophen, diazepam, guaiFENesin, oxyCODONE, tiZANidine   Objective:  Vital Signs  Vitals:   10/01/19 0400 10/01/19 0441 10/01/19 0500  10/01/19 0833  BP:  (!) 132/51  (!) 141/69  Pulse:  (!) 53    Resp:  17 19 20   Temp:  98.9 F (37.2 C)    TempSrc:  Oral    SpO2:  90% 92%   Weight: 78.1 kg     Height:  Intake/Output Summary (Last 24 hours) at 10/01/2019 0948 Last data filed at 10/01/2019 0842 Gross per 24 hour  Intake 890 ml  Output 875 ml  Net 15 ml   Filed Weights   09/29/19 0152 09/30/19 0127 10/01/19 0400  Weight: 77.7 kg 82.4 kg 78.1 kg    General appearance: Patient is somnolent.  Arousable.  Able to answer questions. Resp: Shallow respirations noted.  Crackles bilaterally.  Few wheezes heard bilaterally.  Tachypneic.   Cardio: S1-S2 is normal regular.  No S3-S4.  No rubs murmurs or bruit GI: Abdomen is soft.  Nontender nondistended.  Bowel sounds are present normal.  No masses organomegaly Extremities: Chronic venous stasis dermatitis with edema. Neurologic: Alert and oriented x3.  No focal neurological deficits.      Lab Results:  Data Reviewed: I have personally reviewed following labs and imaging studies  CBC: Recent Labs  Lab 10-13-19 1751 09/29/19 0857 09/30/19 0655  WBC 9.1 7.4 8.1  NEUTROABS  --  5.8  --   HGB 9.9* 9.3* 9.1*  HCT 32.2* 31.0* 30.2*  MCV 103.5* 104.0* 104.5*  PLT 266 223 215    Basic Metabolic Panel: Recent Labs  Lab 13-Oct-2019 1751 09/29/19 0857 09/30/19 0655 10/01/19 0446  NA 141 141 137 135  K 3.7 3.3* 3.8 4.6  CL 92* 92* 92* 89*  CO2 33* 35* 33* 31  GLUCOSE 121* 167* 252* 302*  BUN 73* 68* 73* 78*  CREATININE 1.61* 1.55* 1.61* 1.86*  CALCIUM 9.6 9.1 9.1 9.3  MG  --  2.2  --   --   PHOS  --   --  4.7* 5.2*    GFR: Estimated Creatinine Clearance: 22.9 mL/min (A) (by C-G formula based on SCr of 1.86 mg/dL (H)).  Liver Function Tests: Recent Labs  Lab 09/29/19 0857 09/30/19 0655 10/01/19 0446  AST 41  --   --   ALT 38  --   --   ALKPHOS 224*  --   --   BILITOT 0.8  --   --   PROT 6.0*  --   --   ALBUMIN 2.8* 2.8* 2.9*     CBG: Recent Labs  Lab 09/30/19 1200 09/30/19 1632 09/30/19 2117 10/01/19 0608 10/01/19 0920  GLUCAP 299* 319* 308* 318* 282*     Recent Results (from the past 240 hour(s))  SARS Coronavirus 2 by RT PCR (hospital order, performed in [facility] hospital lab) Nasopharyngeal Nasopharyngeal Swab     Status: None   Collection Time: 10-13-19 11:24 PM   Specimen: Nasopharyngeal Swab  Result Value Ref Range Status   SARS Coronavirus 2 NEGATIVE NEGATIVE Final    Comment: (NOTE) SARS-CoV-2 target nucleic acids are NOT DETECTED. The SARS-CoV-2 RNA is generally detectable in upper and lower respiratory specimens during the acute phase of infection. The lowest concentration of SARS-CoV-2 viral copies this assay can detect is 250 copies / mL. A negative result does not preclude SARS-CoV-2 infection and should not be used as the sole basis for treatment or other patient management decisions.  A negative result may occur with improper specimen collection / handling, submission of specimen other than nasopharyngeal swab, presence of viral mutation(s) within the areas targeted by this assay, and inadequate number of viral copies (<250 copies / mL). A negative result must be combined with clinical observations, patient history, and epidemiological information. Fact Sheet for Patients:   BoilerBrush.com.cy Fact Sheet for Healthcare Providers: https://pope.com/ This test is not yet approved or cleared  by  the [employer] and has been authorized for detection and/or diagnosis of SARS-CoV-2 by FDA under an Emergency Use Authorization (EUA).  This EUA will remain in effect (meaning this test can be used) for the duration of the COVID-19 declaration under Section 564(b)(1) of the Act, 21 U.S.C. section 360bbb-3(b)(1), unless the authorization is terminated or revoked sooner. Performed at [facility], [address] [place],  [place] [address]       Radiology Studies: No results found.     LOS: 3 days   Eliaz Fout [employer] on [email or web address]  10/01/2019, 9:48 AM

## 2019-10-01 NOTE — Progress Notes (Signed)
RT NOTES: Patient transported from 3E to room 42M15 on bipap without incident. Report given to 42M RT.

## 2019-10-02 ENCOUNTER — Inpatient Hospital Stay (HOSPITAL_COMMUNITY): Payer: Medicare Other

## 2019-10-02 DIAGNOSIS — I5031 Acute diastolic (congestive) heart failure: Secondary | ICD-10-CM

## 2019-10-02 DIAGNOSIS — J9600 Acute respiratory failure, unspecified whether with hypoxia or hypercapnia: Secondary | ICD-10-CM

## 2019-10-02 LAB — BASIC METABOLIC PANEL
Anion gap: 13 (ref 5–15)
BUN: 35 mg/dL — ABNORMAL HIGH (ref 8–23)
CO2: 24 mmol/L (ref 22–32)
Calcium: 8.7 mg/dL — ABNORMAL LOW (ref 8.9–10.3)
Chloride: 98 mmol/L (ref 98–111)
Creatinine, Ser: 1.1 mg/dL — ABNORMAL HIGH (ref 0.44–1.00)
GFR calc Af Amer: 56 mL/min — ABNORMAL LOW (ref >60)
GFR calc non Af Amer: 48 mL/min — ABNORMAL LOW (ref >60)
Glucose, Bld: 195 mg/dL — ABNORMAL HIGH (ref 70–99)
Potassium: 5.5 mmol/L — ABNORMAL HIGH (ref 3.5–5.1)
Sodium: 135 mmol/L (ref 135–145)

## 2019-10-02 LAB — POCT ACTIVATED CLOTTING TIME
Activated Clotting Time: 147 seconds
Activated Clotting Time: 153 seconds
Activated Clotting Time: 153 seconds
Activated Clotting Time: 175 seconds
Activated Clotting Time: 180 seconds
Activated Clotting Time: 180 seconds
Activated Clotting Time: 180 seconds
Activated Clotting Time: 186 seconds
Activated Clotting Time: 186 seconds
Activated Clotting Time: 191 seconds
Activated Clotting Time: 191 seconds
Activated Clotting Time: 192 seconds
Activated Clotting Time: 197 seconds
Activated Clotting Time: 202 seconds
Activated Clotting Time: 208 seconds
Activated Clotting Time: 219 seconds
Activated Clotting Time: 224 seconds
Activated Clotting Time: 230 seconds
Activated Clotting Time: 230 seconds
Activated Clotting Time: 241 seconds
Activated Clotting Time: 241 seconds
Activated Clotting Time: 241 seconds

## 2019-10-02 LAB — CBC
HCT: 31.5 % — ABNORMAL LOW (ref 36.0–46.0)
Hemoglobin: 9.4 g/dL — ABNORMAL LOW (ref 12.0–15.0)
MCH: 31.2 pg (ref 26.0–34.0)
MCHC: 29.8 g/dL — ABNORMAL LOW (ref 30.0–36.0)
MCV: 104.7 fL — ABNORMAL HIGH (ref 80.0–100.0)
Platelets: 221 10*3/uL (ref 150–400)
RBC: 3.01 MIL/uL — ABNORMAL LOW (ref 3.87–5.11)
RDW: 13.8 % (ref 11.5–15.5)
WBC: 12.9 10*3/uL — ABNORMAL HIGH (ref 4.0–10.5)
nRBC: 0.8 % — ABNORMAL HIGH (ref 0.0–0.2)

## 2019-10-02 LAB — RENAL FUNCTION PANEL
Albumin: 2.8 g/dL — ABNORMAL LOW (ref 3.5–5.0)
Anion gap: 11 (ref 5–15)
BUN: 39 mg/dL — ABNORMAL HIGH (ref 8–23)
CO2: 27 mmol/L (ref 22–32)
Calcium: 8.9 mg/dL (ref 8.9–10.3)
Chloride: 98 mmol/L (ref 98–111)
Creatinine, Ser: 1.25 mg/dL — ABNORMAL HIGH (ref 0.44–1.00)
GFR calc Af Amer: 48 mL/min — ABNORMAL LOW (ref >60)
GFR calc non Af Amer: 41 mL/min — ABNORMAL LOW (ref >60)
Glucose, Bld: 156 mg/dL — ABNORMAL HIGH (ref 70–99)
Phosphorus: 2.8 mg/dL (ref 2.5–4.6)
Potassium: 5.3 mmol/L — ABNORMAL HIGH (ref 3.5–5.1)
Sodium: 136 mmol/L (ref 135–145)

## 2019-10-02 LAB — COMPREHENSIVE METABOLIC PANEL
ALT: 31 U/L (ref 0–44)
AST: 23 U/L (ref 15–41)
Albumin: 2.8 g/dL — ABNORMAL LOW (ref 3.5–5.0)
Alkaline Phosphatase: 195 U/L — ABNORMAL HIGH (ref 38–126)
Anion gap: 12 (ref 5–15)
BUN: 64 mg/dL — ABNORMAL HIGH (ref 8–23)
CO2: 34 mmol/L — ABNORMAL HIGH (ref 22–32)
Calcium: 9.3 mg/dL (ref 8.9–10.3)
Chloride: 92 mmol/L — ABNORMAL LOW (ref 98–111)
Creatinine, Ser: 1.8 mg/dL — ABNORMAL HIGH (ref 0.44–1.00)
GFR calc Af Amer: 31 mL/min — ABNORMAL LOW (ref >60)
GFR calc non Af Amer: 27 mL/min — ABNORMAL LOW (ref >60)
Glucose, Bld: 126 mg/dL — ABNORMAL HIGH (ref 70–99)
Potassium: 4.9 mmol/L (ref 3.5–5.1)
Sodium: 138 mmol/L (ref 135–145)
Total Bilirubin: 0.7 mg/dL (ref 0.3–1.2)
Total Protein: 6.3 g/dL — ABNORMAL LOW (ref 6.5–8.1)

## 2019-10-02 LAB — GLUCOSE, CAPILLARY
Glucose-Capillary: 109 mg/dL — ABNORMAL HIGH (ref 70–99)
Glucose-Capillary: 96 mg/dL (ref 70–99)
Glucose-Capillary: 100 mg/dL — ABNORMAL HIGH (ref 70–99)
Glucose-Capillary: 152 mg/dL — ABNORMAL HIGH (ref 70–99)
Glucose-Capillary: 203 mg/dL — ABNORMAL HIGH (ref 70–99)

## 2019-10-02 LAB — MRSA PCR SCREENING: MRSA by PCR: NEGATIVE

## 2019-10-02 LAB — TROPONIN I (HIGH SENSITIVITY)
Troponin I (High Sensitivity): 21 ng/L — ABNORMAL HIGH (ref <18)
Troponin I (High Sensitivity): 15 ng/L (ref <18)

## 2019-10-02 LAB — MAGNESIUM
Magnesium: 2.3 mg/dL (ref 1.7–2.4)
Magnesium: 2.4 mg/dL (ref 1.7–2.4)
Magnesium: 2.3 mg/dL (ref 1.7–2.4)

## 2019-10-02 LAB — AMYLASE: Amylase: 554 U/L — ABNORMAL HIGH (ref 28–100)

## 2019-10-02 LAB — PHOSPHORUS
Phosphorus: 4.6 mg/dL (ref 2.5–4.6)
Phosphorus: 2.7 mg/dL (ref 2.5–4.6)

## 2019-10-02 LAB — ECHOCARDIOGRAM COMPLETE
Height: 59 in
Weight: 2709.01 oz

## 2019-10-02 LAB — APTT: aPTT: 91 seconds — ABNORMAL HIGH (ref 24–36)

## 2019-10-02 LAB — BRAIN NATRIURETIC PEPTIDE: B Natriuretic Peptide: 180.4 pg/mL — ABNORMAL HIGH (ref 0.0–100.0)

## 2019-10-02 MED ORDER — SODIUM CHLORIDE 0.9 % IV SOLN
1.0000 g | INTRAVENOUS | Status: DC
  Administered 2019-10-02 – 2019-10-03 (×2): 1 g via INTRAVENOUS
  Filled 2019-10-02: qty 10
  Filled 2019-10-02: qty 1

## 2019-10-02 MED ORDER — PRISMASOL BGK 0/2.5 32-2.5 MEQ/L IV SOLN
INTRAVENOUS | Status: DC
  Filled 2019-10-02 (×2): qty 5000

## 2019-10-02 MED ORDER — PRISMASOL BGK 0/2.5 32-2.5 MEQ/L IV SOLN
INTRAVENOUS | Status: DC
  Filled 2019-10-02 (×3): qty 5000

## 2019-10-02 MED ORDER — ALUM & MAG HYDROXIDE-SIMETH 200-200-20 MG/5ML PO SUSP
15.0000 mL | Freq: Four times a day (QID) | ORAL | Status: DC | PRN
  Administered 2019-10-02: 15 mL via ORAL
  Filled 2019-10-02 (×2): qty 30

## 2019-10-02 MED ORDER — OXYCODONE HCL 5 MG PO TABS
5.0000 mg | ORAL_TABLET | Freq: Four times a day (QID) | ORAL | Status: DC | PRN
  Administered 2019-10-02 – 2019-10-04 (×6): 5 mg via ORAL
  Filled 2019-10-02 (×6): qty 1

## 2019-10-02 NOTE — Progress Notes (Signed)
Pt taken off bipap and placed on 5L HFNC at this time. Pt reports she wears 2L [place] at home all the time. Will wean O2 as tolerated. Pt denies SOB, no increased WOB, VS within normal limits. RT will continue to closely monitor for possible bipap needs in future

## 2019-10-02 NOTE — Progress Notes (Signed)
  Echocardiogram 2D Echocardiogram has been performed.  Delcie Roch 10/02/2019, 2:24 PM

## 2019-10-02 NOTE — Progress Notes (Signed)
[facility] NEPHROLOGY PROGRESS NOTE  Assessment/ Plan: Pt is a 79 y.o. yo female with diastolic CHF, CAD, DM, A. fib, CKD followed by Dr. Marisue Humble admitted with CHF exacerbation and hypoxia.  #Acute on chronic CHF exacerbation/pulmonary edema: Patient has recurring issue with fluid overload.  Recently seen at [facility] and the dose of torsemide was increased and added metolazone.  She was refractory to IV diuretics and had respiratory stress on 5/31.  She was moved to ICU and started on CRRT.  Plan to UF with CRRT as tolerated.  Discussed with ICU nurse and patient's daughter.   #AMS likely CO2 narcosis and metabolic encephalopathy: Currently on BiPAP.  CO2 is still remains high.  Pulmonary is following.  BUN trending down with CRRT.  #AKI on CKD with fluid overload: She received 1 session of dialysis in 08/2019 for uremia.  Kidney ultrasound with bilateral cortical thinning without hydronephrosis.  CRRT as above.I have discussed with the patient's daughter that her kidney function may not improve and she may end up requiring long-term dialysis.  #Acute hypoxic and hypercapnic respiratory failure: She has chronic pleural effusion reportedly it is  stable.  On BiPAP.  Pulmonary is following.                                                   #Anemia of CKD: Iron saturation 14% therefore receiving IV iron.  Monitor hemoglobin.  #Hypertension: Continue amlodipine.  Monitor blood pressure.  #History of A. fib: Monitor heart rate.  Not on anticoagulation.  #Discussed with the patient's daughter and primary team.  Subjective: Seen and examined in ICU.  Still remain lethargic and confused.  On BiPAP.  Started CRRT and tolerating well.  Urine output only 534 cc with IV Lasix and metolazone.  Her daughter at bedside. Objective Vital signs in last 24 hours: Vitals:   10/02/19 0700 10/02/19 0755 10/02/19 0800 10/02/19 0900  BP: (!) 150/69 (!) 150/69 (!) 154/52 (!) 132/59  Pulse:  79 80 88 69  Resp: 17 18 20 17   Temp: (!) 96.6 F (35.9 C)     TempSrc: Axillary     SpO2: 98% 98% 93% 98%  Weight:      Height:       Weight change: -0.4 kg  Intake/Output Summary (Last 24 hours) at 10/02/2019 0927 Last data filed at 10/02/2019 0800 Gross per 24 hour  Intake 749.08 ml  Output 2182 ml  Net -1432.92 ml       Labs: Basic Metabolic Panel: Recent Labs  Lab 10/01/19 0446 10/01/19 0446 10/01/19 2038 10/01/19 2217 10/02/19 0257  NA 135   < > 134* 134* 138  K 4.6   < > 4.9 4.7 4.9  CL 89*  --  90*  --  92*  CO2 31  --  38*  --  34*  GLUCOSE 302*  --  223*  --  126*  BUN 78*  --  80*  --  64*  CREATININE 1.86*  --  2.15*  --  1.80*  CALCIUM 9.3  --  9.2  --  9.3  PHOS 5.2*  --  5.9*  --  4.6   < > = values in this interval not displayed.   Liver Function Tests: Recent Labs  Lab 09/29/19 10/01/19 09/30/19 10/02/19 10/01/19 10/03/19 10/01/19 2038 10/02/19 0257  AST 41  --   --   --  23  ALT 38  --   --   --  31  ALKPHOS 224*  --   --   --  195*  BILITOT 0.8  --   --   --  0.7  PROT 6.0*  --   --   --  6.3*  ALBUMIN 2.8*   < > 2.9* 2.7* 2.8*   < > = values in this interval not displayed.   Recent Labs  Lab 10/02/19 0257  AMYLASE 554*   No results for input(s): AMMONIA in the last 168 hours. CBC: Recent Labs  Lab 10-11-19 1751 10-11-2019 1751 09/29/19 0857 09/29/19 0857 09/30/19 0655 09/30/19 0655 10/01/19 2038 10/01/19 2217 10/02/19 0257  WBC 9.1   < > 7.4   < > 8.1  --  10.6*  --  12.9*  NEUTROABS  --   --  5.8  --   --   --   --   --   --   HGB 9.9*   < > 9.3*   < > 9.1*   < > 9.1* 9.9* 9.4*  HCT 32.2*   < > 31.0*   < > 30.2*   < > 30.0* 29.0* 31.5*  MCV 103.5*  --  104.0*  --  104.5*  --  104.9*  --  104.7*  PLT 266   < > 223   < > 215  --  219  --  221   < > = values in this interval not displayed.   Cardiac Enzymes: No results for input(s): CKTOTAL, CKMB, CKMBINDEX, TROPONINI in the last 168 hours. CBG: Recent Labs  Lab 10/01/19 1902  10/01/19 2026 10/01/19 2350 10/02/19 0355 10/02/19 0802  GLUCAP 219* 223* 150* 109* 96    Iron Studies:  Recent Labs    09/30/19 0822  IRON 40  TIBC 277  FERRITIN 95   Studies/Results: DG CHEST PORT 1 VIEW  Result Date: 10/01/2019 CLINICAL DATA:  Encounter for central line placement EXAM: PORTABLE CHEST 1 VIEW COMPARISON:  Chest radiograph 10/01/2019 at 9:35 a.m. FINDINGS: Interval placement of a right central venous catheter with tip projecting at the mid SVC. Stable cardiomediastinal contours with enlarged heart size. Persistent sub pulmonic right pleural effusion and small left pleural effusion. Diffuse bilateral interstitial opacities and bibasilar consolidation. No pneumothorax. IMPRESSION: 1. Interval placement of a right central venous catheter with tip projecting at the mid SVC. 2. Otherwise, no significant interval change in bilateral interstitial opacities, bibasilar consolidation, and pleural effusions. Electronically Signed   By: Emmaline Kluver M.D.   On: 10/01/2019 20:30   DG CHEST PORT 1 VIEW  Result Date: 10/01/2019 CLINICAL DATA:  Dyspnea EXAM: PORTABLE CHEST 1 VIEW COMPARISON:  10/11/2019 chest radiograph. FINDINGS: Stable cardiomediastinal silhouette with mild cardiomegaly. No pneumothorax. Loculated moderate sub pulmonic right pleural effusion is unchanged. Small left pleural effusion. Diffuse hazy and linear parahilar lung opacities with bibasilar lung consolidation, air bronchograms and volume loss. IMPRESSION: 1. Stable loculated moderate subpulmonic right pleural effusion. Stable small left pleural effusion. 2. Stable cardiomegaly with diffuse hazy and linear parahilar lung opacities, favor pulmonary edema. 3. Bibasilar lung consolidation with air bronchograms, which could represent atelectasis, aspiration or pneumonia. Electronically Signed   By: Delbert Phenix M.D.   On: 10/01/2019 09:47   DG Abd Portable 1V  Result Date: 10/01/2019 CLINICAL DATA:  Abdominal pain  EXAM: PORTABLE ABDOMEN - 1 VIEW COMPARISON:  08/21/2019 CT abdomen/pelvis  FINDINGS: No disproportionately dilated small bowel loops. No evidence of pneumatosis or pneumoperitoneum. Cholecystectomy clips are seen in the right upper quadrant of the abdomen. Abdominal aortic atherosclerosis. Moderate lumbar spondylosis. No radiopaque nephrolithiasis. Patchy bibasilar lung opacities. IMPRESSION: Nonobstructive bowel gas pattern. Electronically Signed   By: Ilona Sorrel M.D.   On: 10/01/2019 09:48    Medications: Infusions: .  prismasol BGK 4/2.5 500 mL/hr at 10/02/19 0736  .  prismasol BGK 4/2.5 300 mL/hr at 10/01/19 2123  . sodium chloride Stopped (09/30/19 1448)  . sodium chloride    . sodium chloride    . ferric gluconate (FERRLECIT/NULECIT) IV Stopped (10/01/19 1013)  . heparin 10,000 units/ 20 mL infusion syringe 950 Units/hr (10/02/19 0833)  . prismasol BGK 4/2.5 1,000 mL/hr at 10/02/19 0736    Scheduled Medications: . amLODipine  10 mg Oral Daily  . aspirin EC  81 mg Oral Daily  . chlorhexidine  15 mL Mouth Rinse BID  . Chlorhexidine Gluconate Cloth  6 each Topical Q0600  . cholecalciferol  2,000 Units Oral Daily  . cycloSPORINE  1 drop Both Eyes BID  . enoxaparin (LOVENOX) injection  30 mg Subcutaneous Q24H  . insulin aspart  0-9 Units Subcutaneous Q4H  . insulin detemir  15 Units Subcutaneous BID  . levothyroxine  88 mcg Oral Q0600  . lidocaine  1 patch Transdermal Q24H  . linaclotide  145 mcg Oral Daily  . loratadine  10 mg Oral Daily  . mouth rinse  15 mL Mouth Rinse q12n4p  . multivitamin with minerals  1 tablet Oral Daily  . pantoprazole  40 mg Oral Daily  . polyethylene glycol  17 g Oral BID  . predniSONE  5 mg Oral Q breakfast  . sodium chloride flush  10-40 mL Intracatheter Q12H  . spironolactone  25 mg Oral Daily    have reviewed scheduled and prn medications.  Physical Exam: General: Ill-looking lethargic female on BiPAP, lying on bed Heart:RRR, s1s2 nl, no  rubs Lungs: Bilateral coarse breath sound, no increased work of breathing Abdomen:soft, Non-tender, non-distended Extremities: Trace LE edema Neurology: Lethargic. Dialysis Access: Right IJ temporary HD catheter placed by PCCM on 5/31st. Vayla Wilhelmi Tanna Furry 10/02/2019,9:27 AM  LOS: 4 days  Pager: [phone]

## 2019-10-02 NOTE — Progress Notes (Signed)
PCCM progress note  Notified by nurse of ectopy. EKG showing mobitz type II block BP is stable  K elevated to 5.3 on latest labs and dialysate changed just now. Will repeat labs and EKG in a few hrs Check mag and replace as needed. Phos is ok  If arrhythmia is still present then may need a cardiology consult  Chilton Greathouse MD [facility] Please see [email or web address] for pager details.  10/02/2019, 6:06 PM
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
## 2019-10-02 NOTE — Progress Notes (Signed)
Patient with pauses on monitor . 12 lead at bedside showed  2nd Degree AV Block Mobitz II. Patient does acknowledge some chest discomfort . Dr Alvia Grove informed . K+ 5.3 on renal profile this evening . Pre and Post Dialysis fluid  K+ changed to correct per Dr Ronalee Belts. Orders for Magnesium check and recheck of BMET and EKG in 4 hours.

## 2019-10-02 DEATH — deceased

## 2019-10-03 ENCOUNTER — Inpatient Hospital Stay (HOSPITAL_COMMUNITY): Payer: Medicare Other

## 2019-10-03 LAB — BLOOD GAS, ARTERIAL
Acid-Base Excess: 3.1 mmol/L — ABNORMAL HIGH (ref 0.0–2.0)
Bicarbonate: 28.1 mmol/L — ABNORMAL HIGH (ref 20.0–28.0)
Drawn by: 137461
FIO2: 44
O2 Saturation: 95 %
Patient temperature: 37
pCO2 arterial: 50.4 mmHg — ABNORMAL HIGH (ref 32.0–48.0)
pH, Arterial: 7.365 (ref 7.350–7.450)
pO2, Arterial: 73.8 mmHg — ABNORMAL LOW (ref 83.0–108.0)

## 2019-10-03 LAB — GLUCOSE, CAPILLARY
Glucose-Capillary: 125 mg/dL — ABNORMAL HIGH (ref 70–99)
Glucose-Capillary: 129 mg/dL — ABNORMAL HIGH (ref 70–99)
Glucose-Capillary: 158 mg/dL — ABNORMAL HIGH (ref 70–99)
Glucose-Capillary: 202 mg/dL — ABNORMAL HIGH (ref 70–99)
Glucose-Capillary: 206 mg/dL — ABNORMAL HIGH (ref 70–99)
Glucose-Capillary: 232 mg/dL — ABNORMAL HIGH (ref 70–99)
Glucose-Capillary: 266 mg/dL — ABNORMAL HIGH (ref 70–99)

## 2019-10-03 LAB — CBC
HCT: 33.6 % — ABNORMAL LOW (ref 36.0–46.0)
Hemoglobin: 10.1 g/dL — ABNORMAL LOW (ref 12.0–15.0)
MCH: 31.2 pg (ref 26.0–34.0)
MCHC: 30.1 g/dL (ref 30.0–36.0)
MCV: 103.7 fL — ABNORMAL HIGH (ref 80.0–100.0)
Platelets: 201 10*3/uL (ref 150–400)
RBC: 3.24 MIL/uL — ABNORMAL LOW (ref 3.87–5.11)
RDW: 14.2 % (ref 11.5–15.5)
WBC: 16.3 10*3/uL — ABNORMAL HIGH (ref 4.0–10.5)
nRBC: 0.6 % — ABNORMAL HIGH (ref 0.0–0.2)

## 2019-10-03 LAB — POCT ACTIVATED CLOTTING TIME
Activated Clotting Time: 191 seconds
Activated Clotting Time: 186 seconds
Activated Clotting Time: 186 seconds
Activated Clotting Time: 180 seconds
Activated Clotting Time: 191 seconds
Activated Clotting Time: 191 seconds

## 2019-10-03 LAB — RENAL FUNCTION PANEL
Albumin: 2.7 g/dL — ABNORMAL LOW (ref 3.5–5.0)
Anion gap: 10 (ref 5–15)
BUN: 27 mg/dL — ABNORMAL HIGH (ref 8–23)
CO2: 25 mmol/L (ref 22–32)
Calcium: 9 mg/dL (ref 8.9–10.3)
Chloride: 100 mmol/L (ref 98–111)
Creatinine, Ser: 0.84 mg/dL (ref 0.44–1.00)
GFR calc Af Amer: >60 mL/min (ref >60)
GFR calc non Af Amer: >60 mL/min (ref >60)
Glucose, Bld: 134 mg/dL — ABNORMAL HIGH (ref 70–99)
Phosphorus: 2.2 mg/dL — ABNORMAL LOW (ref 2.5–4.6)
Potassium: 4.4 mmol/L (ref 3.5–5.1)
Sodium: 135 mmol/L (ref 135–145)

## 2019-10-03 LAB — LIPASE, BLOOD: Lipase: 100 U/L — ABNORMAL HIGH (ref 11–51)

## 2019-10-03 LAB — AMYLASE: Amylase: 257 U/L — ABNORMAL HIGH (ref 28–100)

## 2019-10-03 LAB — MAGNESIUM: Magnesium: 2.4 mg/dL (ref 1.7–2.4)

## 2019-10-03 LAB — APTT: aPTT: 50 seconds — ABNORMAL HIGH (ref 24–36)

## 2019-10-03 MED ORDER — AMLODIPINE BESYLATE 10 MG PO TABS
10.0000 mg | ORAL_TABLET | Freq: Every day | ORAL | Status: DC
  Administered 2019-10-03: 10 mg via ORAL
  Filled 2019-10-03: qty 1

## 2019-10-03 MED ORDER — LABETALOL HCL 5 MG/ML IV SOLN
10.0000 mg | INTRAVENOUS | Status: DC | PRN
  Administered 2019-10-04: 10 mg via INTRAVENOUS
  Filled 2019-10-03: qty 4

## 2019-10-03 MED ORDER — SODIUM PHOSPHATES 45 MMOLE/15ML IV SOLN
20.0000 mmol | Freq: Once | INTRAVENOUS | Status: AC
  Administered 2019-10-03: 20 mmol via INTRAVENOUS
  Filled 2019-10-03: qty 6.67

## 2019-10-03 NOTE — Plan of Care (Signed)
  Problem: Education: Goal: Ability to demonstrate management of disease process will improve Outcome: Progressing   Problem: Activity: Goal: Capacity to carry out activities will improve Outcome: Progressing   Problem: Cardiac: Goal: Ability to achieve and maintain adequate cardiopulmonary perfusion will improve Outcome: Progressing   Problem: Health Behavior/Discharge Planning: Goal: Ability to manage health-related needs will improve Outcome: Progressing   Problem: Coping: Goal: Level of anxiety will decrease Outcome: Progressing   Problem: Safety: Goal: Ability to remain free from injury will improve Outcome: Progressing   Problem: Skin Integrity: Goal: Risk for impaired skin integrity will decrease Outcome: Progressing
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
## 2019-10-03 NOTE — Progress Notes (Signed)
PT Cancellation Note  Patient Details Name: [name] MRN: [id number] DOB: Mar 26, 1941   Cancelled Treatment:    Reason Eval/Treat Not Completed: Patient declined, no reason specified.  "I'm too tired.  Not today."  Will try back as able. 10/03/2019  Jacinto Halim., PT Acute Rehabilitation Services [phone]  (pager) [phone]  (office)   Eliseo Gum Ion Gonnella 10/03/2019, 11:50 AM

## 2019-10-03 NOTE — Progress Notes (Signed)
Pt transferred to [facility] room 24 via bed.Accompied by Print production planner. All belongs went with pt.

## 2019-10-03 NOTE — Progress Notes (Signed)
[name]  Assessment/ Plan: Pt is a 79 y.o. yo female with diastolic CHF, CAD, DM, A. fib, CKD followed by Dr. Marisue Sellers admitted with CHF exacerbation and hypoxia.[name] and the dose of torsemide was increased and added metolazone.  #Acute on chronic CHF exacerbation/pulmonary edema: Patient has recurring issue with fluid overload.    She was refractory to IV diuretics and had respiratory stress on 5/31.  She was moved to ICU and started on CRRT.  She tolerated CRRT well with net 8 L negative.  With BiPAP and diuretics her respiratory status improved.  Plan to hold CRRT today.  We will assess daily for the dialysis need.  Given multiple comorbidities and poor functional status, her quality of life may not increase with outpatient dialysis.  Palliative care was consulted to discuss goals of care.   #AMS likely CO2 narcosis and metabolic encephalopathy: Off BiPAP.  BUN trending down with CRRT.  #AKI on CKD with fluid overload: She received 1 session of dialysis in 08/2019 for uremia.  Kidney ultrasound with bilateral cortical thinning without hydronephrosis.  Holding CRRT as above.  I have discussed with the patient's daughter on 6/1 that her kidney function may not improve and she may end up requiring long-term dialysis.  #Acute hypoxic and hypercapnic respiratory failure: She has chronic pleural effusion reportedly it is  stable.  Pulmonary is following.                                                  #Anemia of CKD: Iron saturation 14% therefore receiving IV iron.  Monitor hemoglobin.  #Hypertension: Continue amlodipine.  Monitor blood pressure.  #History of A. fib: Monitor heart rate.  Not on anticoagulation.  Discussed with ICU team.  Subjective: Seen and examined in ICU.  She is more alert awake and off BiPAP today.  Tolerating CRRT well.  Plan to move out of ICU today. Objective Vital signs in last 24 hours: Vitals:    10/03/19 0505 10/03/19 0510 10/03/19 0600 10/03/19 0700  BP: (!) 180/74 (!) 166/76 (!) 157/83 (!) 179/82  Pulse: 81 80 88 85  Resp: (!) 25 16 20 16   Temp:      TempSrc:      SpO2: 98% 98% 99% 99%  Weight:      Height:       Weight change: -3.6 kg  Intake/Output Summary (Last 24 hours) at 10/03/2019 0845 Last data filed at 10/03/2019 0700 Gross per 24 hour  Intake 680 ml  Output 5800 ml  Net -5120 ml       Labs: Basic Metabolic Panel: Recent Labs  Lab 10/02/19 1531 10/02/19 2140 10/03/19 0319  NA 136 135 135  K 5.3* 5.5* 4.4  CL 98 98 100  CO2 27 24 25   GLUCOSE 156* 195* 134*  BUN 39* 35* 27*  CREATININE 1.25* 1.10* 0.84  CALCIUM 8.9 8.7* 9.0  PHOS 2.8 2.7 2.2*   Liver Function Tests: Recent Labs  Lab 09/29/19 0857 09/30/19 0655 10/02/19 0257 10/02/19 1531 10/03/19 0319  AST 41  --  23  --   --   ALT 38  --  31  --   --   ALKPHOS 224*  --  195*  --   --   BILITOT 0.8  --  0.7  --   --  PROT 6.0*  --  6.3*  --   --   ALBUMIN 2.8*   < > 2.8* 2.8* 2.7*   < > = values in this interval not displayed.   Recent Labs  Lab 10/02/19 0257  AMYLASE 554*   No results for input(s): AMMONIA in the last 168 hours. CBC: Recent Labs  Lab 09/29/19 0857 09/29/19 0857 09/30/19 0655 09/30/19 0655 10/01/19 2038 10/01/19 2038 10/01/19 2217 10/02/19 0257 10/03/19 0319  WBC 7.4   < > 8.1   < > 10.6*  --   --  12.9* 16.3*  NEUTROABS 5.8  --   --   --   --   --   --   --   --   HGB 9.3*   < > 9.1*   < > 9.1*   < > 9.9* 9.4* 10.1*  HCT 31.0*   < > 30.2*   < > 30.0*   < > 29.0* 31.5* 33.6*  MCV 104.0*  --  104.5*  --  104.9*  --   --  104.7* 103.7*  PLT 223   < > 215   < > 219  --   --  221 201   < > = values in this interval not displayed.   Cardiac Enzymes: No results for input(s): CKTOTAL, CKMB, CKMBINDEX, TROPONINI in the last 168 hours. CBG: Recent Labs  Lab 10/02/19 1544 10/02/19 2010 10/03/19 0011 10/03/19 0413 10/03/19 0759  GLUCAP 152* 203* 158* 125*  129*    Iron Studies:  No results for input(s): IRON, TIBC, TRANSFERRIN, FERRITIN in the last 72 hours. Studies/Results: DG CHEST PORT 1 VIEW  Result Date: 10/01/2019 CLINICAL DATA:  Encounter for central line placement EXAM: PORTABLE CHEST 1 VIEW COMPARISON:  Chest radiograph 10/01/2019 at 9:35 a.m. FINDINGS: Interval placement of a right central venous catheter with tip projecting at the mid SVC. Stable cardiomediastinal contours with enlarged heart size. Persistent sub pulmonic right pleural effusion and small left pleural effusion. Diffuse bilateral interstitial opacities and bibasilar consolidation. No pneumothorax. IMPRESSION: 1. Interval placement of a right central venous catheter with tip projecting at the mid SVC. 2. Otherwise, no significant interval change in bilateral interstitial opacities, bibasilar consolidation, and pleural effusions. Electronically Signed   By: Emmaline Kluver M.D.   On: 10/01/2019 20:30   DG CHEST PORT 1 VIEW  Result Date: 10/01/2019 CLINICAL DATA:  Dyspnea EXAM: PORTABLE CHEST 1 VIEW COMPARISON:  09/25/2019 chest radiograph. FINDINGS: Stable cardiomediastinal silhouette with mild cardiomegaly. No pneumothorax. Loculated moderate sub pulmonic right pleural effusion is unchanged. Small left pleural effusion. Diffuse hazy and linear parahilar lung opacities with bibasilar lung consolidation, air bronchograms and volume loss. IMPRESSION: 1. Stable loculated moderate subpulmonic right pleural effusion. Stable small left pleural effusion. 2. Stable cardiomegaly with diffuse hazy and linear parahilar lung opacities, favor pulmonary edema. 3. Bibasilar lung consolidation with air bronchograms, which could represent atelectasis, aspiration or pneumonia. Electronically Signed   By: Delbert Phenix M.D.   On: 10/01/2019 09:47   DG Abd Portable 1V  Result Date: 10/01/2019 CLINICAL DATA:  Abdominal pain EXAM: PORTABLE ABDOMEN - 1 VIEW COMPARISON:  08/21/2019 CT abdomen/pelvis  FINDINGS: No disproportionately dilated small bowel loops. No evidence of pneumatosis or pneumoperitoneum. Cholecystectomy clips are seen in the right upper quadrant of the abdomen. Abdominal aortic atherosclerosis. Moderate lumbar spondylosis. No radiopaque nephrolithiasis. Patchy bibasilar lung opacities. IMPRESSION: Nonobstructive bowel gas pattern. Electronically Signed   By: Delbert Phenix M.D.   On: 10/01/2019 09:48  ECHOCARDIOGRAM COMPLETE  Result Date: 10/02/2019    ECHOCARDIOGRAM REPORT   Patient Name:   [name] [facility] Date of Exam: 10/02/2019 Medical Rec #:  [id number]               Height:       59.0 in Accession #:    [id number]              Weight:       169.3 lb Date of Birth:  08-03-1940              BSA:          1.718 m Patient Age:    78 years                BP:           139/52 mmHg Patient Gender: F                       HR:           83 bpm. Exam Location:  Inpatient Procedure: 2D Echo Indications:    acute diastolic chf 428.31  History:        Patient has prior history of Echocardiogram examinations, most                 recent 09/29/2010. CHF, chronic kidney disease,                 Arrythmias:paroxysmal afib; Risk Factors:Diabetes.  Sonographer:    Delcie Roch Referring Phys: [id number] PRAVEEN MANNAM IMPRESSIONS  1. Left ventricular ejection fraction, by estimation, is 60 to 65%. The left ventricle has normal function. The left ventricle has no regional wall motion abnormalities. There is mild concentric left ventricular hypertrophy. Left ventricular diastolic function could not be evaluated.  2. Right ventricular systolic function is normal. The right ventricular size is normal. There is moderately elevated pulmonary artery systolic pressure.  3. Left atrial size was moderately dilated.  4. The mitral valve is normal in structure. Mild mitral valve regurgitation.  5. The aortic valve is tricuspid. Aortic valve regurgitation is not visualized. No aortic stenosis is present.  Comparison(s): Prior images unable to be directly viewed, comparison made by report only. FINDINGS  Left Ventricle: Left ventricular ejection fraction, by estimation, is 60 to 65%. The left ventricle has normal function. The left ventricle has no regional wall motion abnormalities. The left ventricular internal cavity size was normal in size. There is  mild concentric left ventricular hypertrophy. Left ventricular diastolic function could not be evaluated. Right Ventricle: The right ventricular size is normal. No increase in right ventricular wall thickness. Right ventricular systolic function is normal. There is moderately elevated pulmonary artery systolic pressure. The tricuspid regurgitant velocity is 2.77 m/s, and with an assumed right atrial pressure of 15 mmHg, the estimated right ventricular systolic pressure is 45.7 mmHg. Left Atrium: Left atrial size was moderately dilated. Right Atrium: Right atrial size was normal in size. Pericardium: There is no evidence of pericardial effusion. Mitral Valve: The mitral valve is normal in structure. There is mild thickening of the mitral valve leaflet(s). There is mild calcification of the mitral valve leaflet(s). Moderate mitral annular calcification. Mild mitral valve regurgitation. Tricuspid Valve: The tricuspid valve is normal in structure. Tricuspid valve regurgitation is trivial. No evidence of tricuspid stenosis. Aortic Valve: The aortic valve is tricuspid. Aortic valve regurgitation is not visualized. No aortic stenosis is present. There is mild calcification of the  aortic valve. Pulmonic Valve: The pulmonic valve was grossly normal. Pulmonic valve regurgitation is trivial. No evidence of pulmonic stenosis. Aorta: The aortic root and ascending aorta are structurally normal, with no evidence of dilitation and the aortic root, ascending aorta and aortic arch are all structurally normal, with no evidence of dilitation or obstruction. IAS/Shunts: The atrial septum  is grossly normal.  LEFT VENTRICLE PLAX 2D LVIDd:         4.55 cm LVIDs:         2.24 cm LV PW:         1.15 cm LV IVS:        1.23 cm LVOT diam:     2.00 cm LV SV:         89 LV SV Index:   52 LVOT Area:     3.14 cm  IVC IVC diam: 2.27 cm LEFT ATRIUM             Index       RIGHT ATRIUM           Index LA diam:        3.80 cm 2.21 cm/m  RA Area:     13.40 cm LA Vol (A2C):   59.4 ml 34.57 ml/m RA Volume:   31.10 ml  18.10 ml/m LA Vol (A4C):   66.1 ml 38.47 ml/m LA Biplane Vol: 65.4 ml 38.06 ml/m  AORTIC VALVE LVOT Vmax:   140.00 cm/s LVOT Vmean:  99.200 cm/s LVOT VTI:    0.284 m  AORTA Ao Root diam: 3.00 cm Ao Asc diam:  2.90 cm MITRAL VALVE                TRICUSPID VALVE MV Area (PHT): 3.21 cm     TR Peak grad:   30.7 mmHg MV Decel Time: 236 msec     TR Vmax:        277.00 cm/s MV E velocity: 146.00 cm/s MV A velocity: 127.00 cm/s  SHUNTS MV E/A ratio:  1.15         Systemic VTI:  0.28 m                             Systemic Diam: 2.00 cm Buford Dresser MD Electronically signed by Buford Dresser MD Signature Date/Time: 10/02/2019/9:16:07 PM    Final     Medications: Infusions: . sodium chloride Stopped (09/30/19 1448)  . sodium chloride    . sodium chloride    . cefTRIAXone (ROCEPHIN)  IV Stopped (10/02/19 1238)  . ferric gluconate (FERRLECIT/NULECIT) IV Stopped (10/02/19 1120)  . heparin 10,000 units/ 20 mL infusion syringe 800 Units/hr (10/03/19 0511)  . sodium phosphate  Dextrose 5% IVPB      Scheduled Medications: . amLODipine  10 mg Oral Daily  . aspirin EC  81 mg Oral Daily  . chlorhexidine  15 mL Mouth Rinse BID  . Chlorhexidine Gluconate Cloth  6 each Topical Q0600  . cholecalciferol  2,000 Units Oral Daily  . cycloSPORINE  1 drop Both Eyes BID  . enoxaparin (LOVENOX) injection  30 mg Subcutaneous Q24H  . insulin aspart  0-9 Units Subcutaneous Q4H  . insulin detemir  15 Units Subcutaneous BID  . levothyroxine  88 mcg Oral Q0600  . lidocaine  1 patch Transdermal  Q24H  . linaclotide  145 mcg Oral Daily  . loratadine  10 mg Oral Daily  . mouth rinse  15 mL  Mouth Rinse q12n4p  . multivitamin with minerals  1 tablet Oral Daily  . pantoprazole  40 mg Oral Daily  . polyethylene glycol  17 g Oral BID  . predniSONE  5 mg Oral Q breakfast  . sodium chloride flush  10-40 mL Intracatheter Q12H    have reviewed scheduled and prn medications.  Physical Exam: General: More alert awake and comfortable today. Heart:RRR, s1s2 nl, no rubs Lungs: Bibasal rhonchi, no increased work of breathing Abdomen:soft, Non-tender, non-distended Extremities: Trace LE edema Neurology: Lethargic. Dialysis Access: Right IJ temporary HD catheter placed by PCCM on 5/31st. Jibri Schriefer Jaynie Collins 10/03/2019,8:45 AM  LOS: 5 days  Pager: [phone]

## 2019-10-04 ENCOUNTER — Inpatient Hospital Stay (HOSPITAL_COMMUNITY): Payer: Medicare Other

## 2019-10-04 DIAGNOSIS — M0579 Rheumatoid arthritis with rheumatoid factor of multiple sites without organ or systems involvement: Secondary | ICD-10-CM

## 2019-10-04 DIAGNOSIS — Z66 Do not resuscitate: Secondary | ICD-10-CM | POA: Diagnosis present

## 2019-10-04 DIAGNOSIS — Z515 Encounter for palliative care: Secondary | ICD-10-CM

## 2019-10-04 DIAGNOSIS — Z7189 Other specified counseling: Secondary | ICD-10-CM

## 2019-10-04 DIAGNOSIS — Z789 Other specified health status: Secondary | ICD-10-CM

## 2019-10-04 LAB — COMPREHENSIVE METABOLIC PANEL
ALT: 23 U/L (ref 0–44)
AST: 17 U/L (ref 15–41)
Albumin: 2.4 g/dL — ABNORMAL LOW (ref 3.5–5.0)
Alkaline Phosphatase: 186 U/L — ABNORMAL HIGH (ref 38–126)
Anion gap: 10 (ref 5–15)
BUN: 43 mg/dL — ABNORMAL HIGH (ref 8–23)
CO2: 29 mmol/L (ref 22–32)
Calcium: 9.3 mg/dL (ref 8.9–10.3)
Chloride: 96 mmol/L — ABNORMAL LOW (ref 98–111)
Creatinine, Ser: 1.4 mg/dL — ABNORMAL HIGH (ref 0.44–1.00)
GFR calc Af Amer: 42 mL/min — ABNORMAL LOW (ref >60)
GFR calc non Af Amer: 36 mL/min — ABNORMAL LOW (ref >60)
Glucose, Bld: 188 mg/dL — ABNORMAL HIGH (ref 70–99)
Potassium: 4.8 mmol/L (ref 3.5–5.1)
Sodium: 135 mmol/L (ref 135–145)
Total Bilirubin: 0.5 mg/dL (ref 0.3–1.2)
Total Protein: 6 g/dL — ABNORMAL LOW (ref 6.5–8.1)

## 2019-10-04 LAB — RENAL FUNCTION PANEL
Albumin: 2.4 g/dL — ABNORMAL LOW (ref 3.5–5.0)
Anion gap: 10 (ref 5–15)
BUN: 39 mg/dL — ABNORMAL HIGH (ref 8–23)
CO2: 27 mmol/L (ref 22–32)
Calcium: 9.1 mg/dL (ref 8.9–10.3)
Chloride: 96 mmol/L — ABNORMAL LOW (ref 98–111)
Creatinine, Ser: 1.38 mg/dL — ABNORMAL HIGH (ref 0.44–1.00)
GFR calc Af Amer: 43 mL/min — ABNORMAL LOW (ref >60)
GFR calc non Af Amer: 37 mL/min — ABNORMAL LOW (ref >60)
Glucose, Bld: 188 mg/dL — ABNORMAL HIGH (ref 70–99)
Phosphorus: 3.7 mg/dL (ref 2.5–4.6)
Potassium: 4.6 mmol/L (ref 3.5–5.1)
Sodium: 133 mmol/L — ABNORMAL LOW (ref 135–145)

## 2019-10-04 LAB — URINE CULTURE: Culture: >=100000 — AB

## 2019-10-04 LAB — APTT: aPTT: 44 seconds — ABNORMAL HIGH (ref 24–36)

## 2019-10-04 LAB — CBC
HCT: 31 % — ABNORMAL LOW (ref 36.0–46.0)
Hemoglobin: 9.4 g/dL — ABNORMAL LOW (ref 12.0–15.0)
MCH: 31.2 pg (ref 26.0–34.0)
MCHC: 30.3 g/dL (ref 30.0–36.0)
MCV: 103 fL — ABNORMAL HIGH (ref 80.0–100.0)
Platelets: 213 10*3/uL (ref 150–400)
RBC: 3.01 MIL/uL — ABNORMAL LOW (ref 3.87–5.11)
RDW: 14.6 % (ref 11.5–15.5)
WBC: 17.2 10*3/uL — ABNORMAL HIGH (ref 4.0–10.5)
nRBC: 0.8 % — ABNORMAL HIGH (ref 0.0–0.2)

## 2019-10-04 LAB — GLUCOSE, CAPILLARY
Glucose-Capillary: 172 mg/dL — ABNORMAL HIGH (ref 70–99)
Glucose-Capillary: 146 mg/dL — ABNORMAL HIGH (ref 70–99)

## 2019-10-04 LAB — AMYLASE: Amylase: 79 U/L (ref 28–100)

## 2019-10-04 LAB — LIPASE, BLOOD: Lipase: 73 U/L — ABNORMAL HIGH (ref 11–51)

## 2019-10-04 LAB — MAGNESIUM: Magnesium: 2.4 mg/dL (ref 1.7–2.4)

## 2019-10-04 LAB — PHOSPHORUS: Phosphorus: 3.6 mg/dL (ref 2.5–4.6)

## 2019-10-04 MED ORDER — LORAZEPAM 1 MG PO TABS: 1.0000 mg | ORAL_TABLET | ORAL | Status: DC | PRN

## 2019-10-04 MED ORDER — LORAZEPAM 2 MG/ML PO CONC: 1.0000 mg | ORAL | Status: DC | PRN

## 2019-10-04 MED ORDER — GLYCOPYRROLATE 0.2 MG/ML IJ SOLN
0.2000 mg | INTRAMUSCULAR | Status: DC | PRN
  Administered 2019-10-04 (×2): 0.2 mg via INTRAVENOUS
  Filled 2019-10-04 (×2): qty 1

## 2019-10-04 MED ORDER — SODIUM CHLORIDE 0.9 % IV SOLN
0.6000 mg/h | INTRAVENOUS | Status: DC
  Administered 2019-10-04: 0.2 mg/h via INTRAVENOUS
  Filled 2019-10-04: qty 5

## 2019-10-04 MED ORDER — LORAZEPAM 2 MG/ML IJ SOLN: 1.0000 mg | INTRAMUSCULAR | Status: DC | PRN

## 2019-10-04 MED ORDER — POLYVINYL ALCOHOL 1.4 % OP SOLN
1.0000 [drp] | Freq: Four times a day (QID) | OPHTHALMIC | Status: DC | PRN
  Filled 2019-10-04: qty 15

## 2019-10-04 MED ORDER — HYDROMORPHONE BOLUS VIA INFUSION
1.0000 mg | INTRAVENOUS | Status: DC | PRN
  Filled 2019-10-04: qty 1

## 2019-10-04 MED ORDER — GLYCOPYRROLATE 1 MG PO TABS
1.0000 mg | ORAL_TABLET | ORAL | Status: DC | PRN
  Filled 2019-10-04: qty 1

## 2019-10-04 MED ORDER — LORAZEPAM 2 MG/ML IJ SOLN
1.0000 mg | INTRAMUSCULAR | Status: AC
  Administered 2019-10-04: 1 mg via INTRAVENOUS
  Filled 2019-10-04: qty 1

## 2019-10-04 MED ORDER — ONDANSETRON HCL 4 MG/2ML IJ SOLN: 4.0000 mg | Freq: Four times a day (QID) | INTRAMUSCULAR | Status: DC | PRN

## 2019-10-04 MED ORDER — BIOTENE DRY MOUTH MT LIQD: 15.0000 mL | OROMUCOSAL | Status: DC | PRN

## 2019-10-04 MED ORDER — HYDROMORPHONE HCL 1 MG/ML IJ SOLN
0.5000 mg | INTRAMUSCULAR | Status: AC
  Administered 2019-10-04: 0.5 mg via INTRAVENOUS
  Filled 2019-10-04: qty 0.5

## 2019-10-04 MED ORDER — FUROSEMIDE 10 MG/ML IJ SOLN: 80.0000 mg | Freq: Two times a day (BID) | INTRAMUSCULAR | Status: DC

## 2019-10-04 MED ORDER — HYDROMORPHONE BOLUS VIA INFUSION
0.5000 mg | INTRAVENOUS | Status: DC | PRN
  Administered 2019-10-04 (×3): 0.5 mg via INTRAVENOUS
  Filled 2019-10-04: qty 1

## 2019-10-04 MED ORDER — ACETAMINOPHEN 325 MG PO TABS: 650.0000 mg | ORAL_TABLET | Freq: Four times a day (QID) | ORAL | Status: DC | PRN

## 2019-10-04 MED ORDER — ACETAMINOPHEN 650 MG RE SUPP: 650.0000 mg | Freq: Four times a day (QID) | RECTAL | Status: DC | PRN

## 2019-10-04 MED ORDER — ONDANSETRON 4 MG PO TBDP
4.0000 mg | ORAL_TABLET | Freq: Four times a day (QID) | ORAL | Status: DC | PRN
  Filled 2019-10-04: qty 1

## 2019-10-04 MED ORDER — GLYCOPYRROLATE 0.2 MG/ML IJ SOLN: 0.2000 mg | INTRAMUSCULAR | Status: DC | PRN

## 2019-10-04 NOTE — Progress Notes (Signed)
Chaplain F/U spiritual care.  Chaplain notes Pt. family and PMT NP-Julia is present bedside.  F/U spiritual care will be ongoing throughout the day.

## 2019-10-04 NOTE — Progress Notes (Signed)
PT Cancellation Note  Patient Details Name: Myrta Mercer MRN: [id number] DOB: 03/16/1941   Cancelled Treatment:    Reason Eval/Treat Not Completed: Other (comment)(Pt now comfort care. Will sign off. )   Berline Lopes 10/04/2019, 1:37 PM Hether Anselmo W,PT Acute Rehabilitation Services Pager:  [phone]  Office:  [phone]

## 2019-10-04 NOTE — Progress Notes (Signed)
Pt revived prn dose of labetalol due to systolic of 181 Pt had two pauses one 2.17 another 2.27  Central tele then notified my staying it was 2 degree heart block type 1  MD notified EKG preformed and in chart Pt asymptomatic  Will continue to monitor

## 2019-10-04 NOTE — Progress Notes (Signed)
   10/04/19 1147  Clinical Encounter Type  Visited With Patient and family together  Visit Type Initial;Spiritual support  Referral From Palliative care team  Consult/Referral To Chaplain  Spiritual Encounters  Spiritual Needs Prayer  This chaplain responded to PMT NP-Julia referral for spiritual care as the Pt. transitions to Comfort Care. The chaplain was pastorally present with the Pt. 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  The chaplain heard and affirmed [name]'s desire to know she has not given up on her mother. [name] is contacting family members for support and comfort.  The chaplain understands [name]'s husband [name] will be arriving soon.  [name] is contacting the Pt. sisters and son with whom may be as much as three hours away. [name] accepted the chaplain's invitation for F/U spiritual care as needed.

## 2019-10-04 NOTE — Progress Notes (Signed)
[facility] NEPHROLOGY PROGRESS NOTE  Assessment/ Plan: Pt is a 79 y.o. yo female with diastolic CHF, CAD, DM, A. fib, CKD followed by Dr. Marisue Humble admitted with CHF exacerbation and hypoxia.Recently seen at [facility] and the dose of torsemide was increased and added metolazone.  #Acute on chronic CHF exacerbation/pulmonary edema: Patient has recurring issue with fluid overload.  She was refractory to IV diuretics and had respiratory stress on 5/31.  She was moved to ICU and started on CRRT.  She tolerated CRRT well with net 8 L negative.  With BiPAP and diuretics her respiratory status improved.     #AMS likely CO2 narcosis and metabolic encephalopathy: Off BiPAP.  BUN improved with CRRT.  #AKI on CKD with fluid overload:  Kidney ultrasound with bilateral cortical thinning without hydronephrosis.  Received CRRT which was stopped on 6/2.  She has reduced urine output.  Start Lasix 80 mg IV twice a day.  No plan for dialysis today and watch for renal recovery. I have discussed with the patient's daughter that her kidney function may not improve and she may end up requiring long-term dialysis. Given multiple comorbidities and poor functional status, her quality of life may not increase with outpatient dialysis.  Palliative care was consulted to discuss goals of care.   #Acute hypoxic and hypercapnic respiratory failure: She has chronic pleural effusion reportedly it is  stable.  Pulmonary is following.  On BiPAP.                                                  #Anemia of CKD: Iron saturation 14% therefore receiving IV iron.  Monitor hemoglobin.  #Hypertension: Continue amlodipine.  Volume managed with diuretics as above.  Monitor blood pressure.  #History of A. fib: Monitor heart rate.  Not on anticoagulation.  Discussed with the patient's daughter.  Subjective: Seen and examined.  Using BiPAP.  She is out of ICU.  Denies nausea vomiting chest pain.  ROS  limited. Objective Vital signs in last 24 hours: Vitals:   10/04/19 0650 10/04/19 0653 10/04/19 0800 10/04/19 0820  BP: (!) 149/59  (!) 170/57   Pulse: (!) 51   74  Resp: (!) 21   (!) 34  Temp:  98.2 F (36.8 C) 98.1 F (36.7 C)   TempSrc:  Oral Oral   SpO2: 100%   96%  Weight:      Height:       Weight change: -1.7 kg  Intake/Output Summary (Last 24 hours) at 10/04/2019 1022 Last data filed at 10/04/2019 0500 Gross per 24 hour  Intake 519.82 ml  Output 100 ml  Net 419.82 ml       Labs: Basic Metabolic Panel: Recent Labs  Lab 10/02/19 2140 10/03/19 0319 10/04/19 0256  NA 135 135 135  133*  K 5.5* 4.4 4.8  4.6  CL 98 100 96*  96*  CO2 24 25 29  27   GLUCOSE 195* 134* 188*  188*  BUN 35* 27* 43*  39*  CREATININE 1.10* 0.84 1.40*  1.38*  CALCIUM 8.7* 9.0 9.3  9.1  PHOS 2.7 2.2* 3.6  3.7   Liver Function Tests: Recent Labs  Lab 09/29/19 0857 09/30/19 0655 10/02/19 0257 10/02/19 0257 10/02/19 1531 10/03/19 0319 10/04/19 0256  AST 41  --  23  --   --   --  17  ALT 38  --  31  --   --   --  23  ALKPHOS 224*  --  195*  --   --   --  186*  BILITOT 0.8  --  0.7  --   --   --  0.5  PROT 6.0*  --  6.3*  --   --   --  6.0*  ALBUMIN 2.8*   < > 2.8*   < > 2.8* 2.7* 2.4*  2.4*   < > = values in this interval not displayed.   Recent Labs  Lab 10/02/19 0257 10/03/19 0319 10/04/19 0256  LIPASE  --  100* 73*  AMYLASE 554* 257* 79   No results for input(s): AMMONIA in the last 168 hours. CBC: Recent Labs  Lab 09/29/19 0857 09/29/19 0857 09/30/19 0655 09/30/19 0655 10/01/19 2038 10/01/19 2217 10/02/19 0257 10/03/19 0319 10/04/19 0256  WBC 7.4   < > 8.1   < > 10.6*   < > 12.9* 16.3* 17.2*  NEUTROABS 5.8  --   --   --   --   --   --   --   --   HGB 9.3*   < > 9.1*   < > 9.1*   < > 9.4* 10.1* 9.4*  HCT 31.0*   < > 30.2*   < > 30.0*   < > 31.5* 33.6* 31.0*  MCV 104.0*   < > 104.5*  --  104.9*  --  104.7* 103.7* 103.0*  PLT 223   < > 215   < > 219    < > 221 201 213   < > = values in this interval not displayed.   Cardiac Enzymes: No results for input(s): CKTOTAL, CKMB, CKMBINDEX, TROPONINI in the last 168 hours. CBG: Recent Labs  Lab 10/03/19 1544 10/03/19 1951 10/03/19 2356 10/04/19 0409 10/04/19 0803  GLUCAP 266* 232* 206* 172* 146*    Iron Studies:  No results for input(s): IRON, TIBC, TRANSFERRIN, FERRITIN in the last 72 hours. Studies/Results: DG Chest Port 1 View  Result Date: 10/04/2019 CLINICAL DATA:  Acute respiratory failure EXAM: PORTABLE CHEST 1 VIEW COMPARISON:  Yesterday FINDINGS: Right IJ line with tip at the SVC. Low volume chest with right complicated sub pulmonic effusion by CT. Interstitial prominence on both sides that is unchanged. Cardiomegaly. IMPRESSION: Stable low volume chest with interstitial opacities and loculated right pleural effusion. Electronically Signed   By: Marnee Spring M.D.   On: 10/04/2019 08:46   DG Chest Port 1 View  Result Date: 10/03/2019 CLINICAL DATA:  Acute respiratory failure EXAM: PORTABLE CHEST 1 VIEW COMPARISON:  10/01/2018 FINDINGS: Low volume chest with diffuse interstitial and airspace opacity. Loculated right pleural effusion with elevated right diaphragm, reference recent chest CT. Borderline heart size that is stable. Right IJ line with tip at the SVC. Remarkably severe bilateral shoulder osteoarthritis. History of rheumatoid arthritis. IMPRESSION: Unchanged low volume chest with extensive pulmonary opacity and pleural effusions. Electronically Signed   By: Marnee Spring M.D.   On: 10/03/2019 09:21   ECHOCARDIOGRAM COMPLETE  Result Date: 10/02/2019    ECHOCARDIOGRAM REPORT   Patient Name:   HATLEY HENEGAR [facility] Date of Exam: 10/02/2019 Medical Rec #:  [id number]               Height:       59.0 in Accession #:    [id number]  Weight:       169.3 lb Date of Birth:  Aug 13, 1940              BSA:          1.718 m Patient Age:    79 years                BP:            139/52 mmHg Patient Gender: F                       HR:           83 bpm. Exam Location:  Inpatient Procedure: 2D Echo Indications:    acute diastolic chf 428.31  History:        Patient has prior history of Echocardiogram examinations, most                 recent 09/29/2010. CHF, chronic kidney disease,                 Arrythmias:paroxysmal afib; Risk Factors:Diabetes.  Sonographer:    Delcie Roch Referring Phys: [id number] PRAVEEN MANNAM IMPRESSIONS  1. Left ventricular ejection fraction, by estimation, is 60 to 65%. The left ventricle has normal function. The left ventricle has no regional wall motion abnormalities. There is mild concentric left ventricular hypertrophy. Left ventricular diastolic function could not be evaluated.  2. Right ventricular systolic function is normal. The right ventricular size is normal. There is moderately elevated pulmonary artery systolic pressure.  3. Left atrial size was moderately dilated.  4. The mitral valve is normal in structure. Mild mitral valve regurgitation.  5. The aortic valve is tricuspid. Aortic valve regurgitation is not visualized. No aortic stenosis is present. Comparison(s): Prior images unable to be directly viewed, comparison made by report only. FINDINGS  Left Ventricle: Left ventricular ejection fraction, by estimation, is 60 to 65%. The left ventricle has normal function. The left ventricle has no regional wall motion abnormalities. The left ventricular internal cavity size was normal in size. There is  mild concentric left ventricular hypertrophy. Left ventricular diastolic function could not be evaluated. Right Ventricle: The right ventricular size is normal. No increase in right ventricular wall thickness. Right ventricular systolic function is normal. There is moderately elevated pulmonary artery systolic pressure. The tricuspid regurgitant velocity is 2.77 m/s, and with an assumed right atrial pressure of 15 mmHg, the estimated right ventricular  systolic pressure is 45.7 mmHg. Left Atrium: Left atrial size was moderately dilated. Right Atrium: Right atrial size was normal in size. Pericardium: There is no evidence of pericardial effusion. Mitral Valve: The mitral valve is normal in structure. There is mild thickening of the mitral valve leaflet(s). There is mild calcification of the mitral valve leaflet(s). Moderate mitral annular calcification. Mild mitral valve regurgitation. Tricuspid Valve: The tricuspid valve is normal in structure. Tricuspid valve regurgitation is trivial. No evidence of tricuspid stenosis. Aortic Valve: The aortic valve is tricuspid. Aortic valve regurgitation is not visualized. No aortic stenosis is present. There is mild calcification of the aortic valve. Pulmonic Valve: The pulmonic valve was grossly normal. Pulmonic valve regurgitation is trivial. No evidence of pulmonic stenosis. Aorta: The aortic root and ascending aorta are structurally normal, with no evidence of dilitation and the aortic root, ascending aorta and aortic arch are all structurally normal, with no evidence of dilitation or obstruction. IAS/Shunts: The atrial septum is grossly normal.  LEFT VENTRICLE PLAX 2D LVIDd:  4.55 cm LVIDs:         2.24 cm LV PW:         1.15 cm LV IVS:        1.23 cm LVOT diam:     2.00 cm LV SV:         89 LV SV Index:   52 LVOT Area:     3.14 cm  IVC IVC diam: 2.27 cm LEFT ATRIUM             Index       RIGHT ATRIUM           Index LA diam:        3.80 cm 2.21 cm/m  RA Area:     13.40 cm LA Vol (A2C):   59.4 ml 34.57 ml/m RA Volume:   31.10 ml  18.10 ml/m LA Vol (A4C):   66.1 ml 38.47 ml/m LA Biplane Vol: 65.4 ml 38.06 ml/m  AORTIC VALVE LVOT Vmax:   140.00 cm/s LVOT Vmean:  99.200 cm/s LVOT VTI:    0.284 m  AORTA Ao Root diam: 3.00 cm Ao Asc diam:  2.90 cm MITRAL VALVE                TRICUSPID VALVE MV Area (PHT): 3.21 cm     TR Peak grad:   30.7 mmHg MV Decel Time: 236 msec     TR Vmax:        277.00 cm/s MV E  velocity: 146.00 cm/s MV A velocity: 127.00 cm/s  SHUNTS MV E/A ratio:  1.15         Systemic VTI:  0.28 m                             Systemic Diam: 2.00 cm Jodelle Red MD Electronically signed by Jodelle Red MD Signature Date/Time: 10/02/2019/9:16:07 PM    Final     Medications: Infusions: . sodium chloride Stopped (09/30/19 1448)  . sodium chloride    . sodium chloride    . cefTRIAXone (ROCEPHIN)  IV Stopped (10/03/19 1158)  . heparin 10,000 units/ 20 mL infusion syringe 800 Units/hr (10/03/19 0511)    Scheduled Medications: . amLODipine  10 mg Oral Daily  . aspirin EC  81 mg Oral Daily  . chlorhexidine  15 mL Mouth Rinse BID  . Chlorhexidine Gluconate Cloth  6 each Topical Q0600  . cholecalciferol  2,000 Units Oral Daily  . cycloSPORINE  1 drop Both Eyes BID  . enoxaparin (LOVENOX) injection  30 mg Subcutaneous Q24H  . insulin aspart  0-9 Units Subcutaneous Q4H  . insulin detemir  15 Units Subcutaneous BID  . levothyroxine  88 mcg Oral Q0600  . lidocaine  1 patch Transdermal Q24H  . linaclotide  145 mcg Oral Daily  . loratadine  10 mg Oral Daily  . mouth rinse  15 mL Mouth Rinse q12n4p  . multivitamin with minerals  1 tablet Oral Daily  . pantoprazole  40 mg Oral Daily  . polyethylene glycol  17 g Oral BID  . predniSONE  5 mg Oral Q breakfast  . sodium chloride flush  10-40 mL Intracatheter Q12H    have reviewed scheduled and prn medications.  Physical Exam: General: Alert awake and on BiPAP. Heart:RRR, s1s2 nl, no rubs Lungs: Bibasal rhonchi, no increased work of breathing Abdomen:soft, Non-tender, non-distended Extremities:  LE edema+ Neurology: Lethargic. Dialysis Access: Right IJ temporary HD catheter  placed by PCCM on 5/31st. Melessa Cowell Tanna Furry 10/04/2019,10:22 AM  LOS: 6 days  Pager: [phone]

## 2019-10-04 NOTE — Progress Notes (Signed)
Physical Therapy Discharge Patient Details Name: [name] MRN: [id number] DOB: 1940-06-26 Today's Date: 10/04/2019 Time: 1001-1030 PT Time Calculation (min) (ACUTE ONLY): 29 min  Patient discharged from PT services secondary to medical decline - will need to re-order PT to resume therapy services.  Please see latest therapy progress note for current level of functioning and progress toward goals.    Progress and discharge plan discussed with patient and/or caregiver: Patient/Caregiver agrees with plan  GP     Berline Lopes 10/04/2019, 1:46 PM  Maryse Brierley W,PT Acute Rehabilitation Services Pager:  [phone]  Office:  [phone]

## 2019-10-04 NOTE — Progress Notes (Signed)
Occupational Therapy Treatment Patient Details Name: [name] MRN: [id number] DOB: [birth year] Today's Date: 10/04/2019    History of present illness Pt is a 79 y.o. yo female with diastolic CHF, CAD, DM, A. fib, CKD followed by Dr. Joelyn Oms admitted with CHF exacerbation and hypoxia.   OT comments  Pt seen for OT follow up, with request from RN in assisting patient back to bed. Palliative team in room with daughter, deciding to transition to comfort care. Pt is on BiPAP during session, required max A +2 to stand pivot back to bed. Max A +2 needed for bed mobility as well. Given pt and family's choice of comfort care, OT will sign off at this time. All education given and questions answered to pt and family. Thank you for this consult.    Follow Up Recommendations  Other (comment)(Pt going comfort care)    Equipment Recommendations  None recommended by OT    Recommendations for Other Services      Precautions / Restrictions Precautions Precautions: Fall Restrictions Weight Bearing Restrictions: No       Mobility Bed Mobility Overal bed mobility: Needs Assistance Bed Mobility: Sit to Supine     Supine to sit: Max assist;+2 for physical assistance;HOB elevated     General bed mobility comments: assist for both LEs and trunk  Transfers Overall transfer level: Needs assistance Equipment used: 2 person hand held assist Transfers: Sit to/from Stand Sit to Stand: Max assist;+2 physical assistance;From elevated surface Stand pivot transfers: Max assist;+2 physical assistance;+2 safety/equipment       General transfer comment: max A +2 to pivot back to bed, assist at hips with bed pad    Balance Overall balance assessment: Needs assistance Sitting-balance support: Bilateral upper extremity supported;Feet supported Sitting balance-Leahy Scale: Poor Sitting balance - Comments: able to sit EOb with min guard assit and do some exercises    Standing balance  support: Bilateral upper extremity supported;During functional activity Standing balance-Leahy Scale: Poor Standing balance comment: relies on UE and external support.                            ADL either performed or assessed with clinical judgement   ADL Overall ADL's : Needs assistance/impaired                         Toilet Transfer: Maximal assistance;+2 for physical assistance;+2 for safety/equipment;Squat-pivot Toilet Transfer Details (indicate cue type and reason): simulated to bed from chair           General ADL Comments: pt on biPAP, lethargic and plans to go comfort care being made near end of session with daughter and team     Vision Baseline Vision/History: Wears glasses Wears Glasses: At all times Patient Visual Report: No change from baseline     Perception     Praxis      Cognition Arousal/Alertness: Lethargic;Suspect due to medications Behavior During Therapy: Flat affect Overall Cognitive Status: Difficult to assess                                 General Comments: pt very lethargic, on BiPAP        Exercises    Shoulder Instructions       General Comments VSS    Pertinent Vitals/ Pain       Pain Assessment: Faces Faces Pain  Scale: Hurts a little bit Pain Location: back, generalized Pain Descriptors / Indicators: Constant Pain Intervention(s): Limited activity within patient's tolerance;Monitored during session;Repositioned  Home Living                                          Prior Functioning/Environment              Frequency           Progress Toward Goals  OT Goals(current goals can now be found in the care plan section)  Progress towards OT goals: Not progressing toward goals - comment(going comfort)  Acute Rehab OT Goals Patient Stated Goal: be comfortable OT Goal Formulation: With family  Plan Discharge plan needs to be updated    Co-evaluation                  AM-PAC OT "6 Clicks" Daily Activity     Outcome Measure   Help from another person eating meals?: A Lot Help from another person taking care of personal grooming?: A Lot Help from another person toileting, which includes using toliet, bedpan, or urinal?: Total Help from another person bathing (including washing, rinsing, drying)?: Total Help from another person to put on and taking off regular upper body clothing?: A Lot Help from another person to put on and taking off regular lower body clothing?: Total 6 Click Score: 9    End of Session Equipment Utilized During Treatment: Gait belt;Oxygen  OT Visit Diagnosis: Unsteadiness on feet (R26.81);Muscle weakness (generalized) (M62.81);Pain Pain - part of body: (back)   Activity Tolerance Patient limited by lethargy   Patient Left in bed;with call bell/phone within reach   Nurse Communication Mobility status        Time: 0867-6195 OT Time Calculation (min): 8 min  Charges: OT Treatments $Self Care/Home Management : 8-22 mins  Dalphine Handing, MSOT, OTR/L Acute Rehabilitation Services [facility] Office Number: [phone] Pager: [phone]   Dalphine Handing 10/04/2019, 12:16 PM

## 2019-10-04 NOTE — Progress Notes (Signed)
Physical Therapy Treatment Patient Details Name: [name] MRN: [id number] DOB: 07/09/1940 Today's Date: 10/04/2019    History of Present Illness Pt is a 79 y.o. yo female with diastolic CHF, CAD, DM, A. fib, CKD followed by Dr. Marisue Humble admitted with CHF exacerbation and hypoxia.    PT Comments    Pt admitted with above diagnosis. Pt needed max assist to pivot to recliner of 2 persons. Sitting balance better today.  Pt currently with functional limitations due to the deficits listed below (see PT Problem List). Pt will benefit from skilled PT to increase their independence and safety with mobility to allow discharge to the venue listed below.    Follow Up Recommendations  Home health PT;Supervision/Assistance - 24 hour     Equipment Recommendations  None recommended by PT    Recommendations for Other Services       Precautions / Restrictions Precautions Precautions: Fall Restrictions Weight Bearing Restrictions: No    Mobility  Bed Mobility Overal bed mobility: Needs Assistance Bed Mobility: Supine to Sit     Supine to sit: Max assist;+2 for physical assistance;HOB elevated     General bed mobility comments: Assist for LEs and incr assist for elevation of trunk  Transfers Overall transfer level: Needs assistance Equipment used: 2 person hand held assist Transfers: Sit to/from Stand Sit to Stand: Max assist;+2 physical assistance;From elevated surface Stand pivot transfers: Max assist;+2 physical assistance;Mod assist       General transfer comment: Pt was able to perform stand pivot to the recliner wiht use of pad at buttocks to encourage upright stance. Stood and turned feet with mod to max assist.   Ambulation/Gait                 Stairs             Wheelchair Mobility    Modified Rankin (Stroke Patients Only)       Balance Overall balance assessment: Needs assistance Sitting-balance support: Bilateral upper extremity  supported;Feet supported Sitting balance-Leahy Scale: Fair Sitting balance - Comments: able to sit EOb with min guard assit and do some exercises    Standing balance support: Bilateral upper extremity supported;During functional activity Standing balance-Leahy Scale: Poor Standing balance comment: relies on UE and external support.                             Cognition Arousal/Alertness: Lethargic;Suspect due to medications Behavior During Therapy: Flat affect Overall Cognitive Status: Within Functional Limits for tasks assessed                                 General Comments: Pt awoken more throughout session with max cues and movement      Exercises General Exercises - Upper Extremity Shoulder Flexion: AROM;Both;5 reps;Seated General Exercises - Lower Extremity Ankle Circles/Pumps: AROM;Both;10 reps;Seated Long Arc Quad: AROM;Both;5 reps;Seated    General Comments General comments (skin integrity, edema, etc.): VSS      Pertinent Vitals/Pain Pain Assessment: Faces Faces Pain Scale: Hurts a little bit Pain Location: back, generalized Pain Descriptors / Indicators: Constant Pain Intervention(s): Limited activity within patient's tolerance;Monitored during session;Repositioned    Home Living                      Prior Function            PT Goals (current  goals can now be found in the care plan section) Acute Rehab PT Goals Patient Stated Goal: feel better, walk Progress towards PT goals: Progressing toward goals    Frequency    Min 3X/week      PT Plan Current plan remains appropriate    Co-evaluation              AM-PAC PT "6 Clicks" Mobility   Outcome Measure  Help needed turning from your back to your side while in a flat bed without using bedrails?: A Lot Help needed moving from lying on your back to sitting on the side of a flat bed without using bedrails?: A Lot Help needed moving to and from a bed to a  chair (including a wheelchair)?: A Lot Help needed standing up from a chair using your arms (e.g., wheelchair or bedside chair)?: A Lot Help needed to walk in hospital room?: Total Help needed climbing 3-5 steps with a railing? : Total 6 Click Score: 10    End of Session Equipment Utilized During Treatment: Gait belt;Oxygen Activity Tolerance: Patient limited by fatigue Patient left: in chair;with call bell/phone within reach;with family/visitor present Nurse Communication: Mobility status PT Visit Diagnosis: Unsteadiness on feet (R26.81);Other abnormalities of gait and mobility (R26.89);Difficulty in walking, not elsewhere classified (R26.2)     Time: 1001-1030 PT Time Calculation (min) (ACUTE ONLY): 29 min  Charges:  $Therapeutic Exercise: 8-22 mins $Therapeutic Activity: 8-22 mins                     Holten Spano W,PT Acute Rehabilitation Services Pager:  [phone]  Office:  [place] 10/04/2019, 12:02 PM

## 2019-10-04 NOTE — Progress Notes (Signed)
Patient ha 0 ml out MD notified, patient now comfort care, will continue to monitor.

## 2019-10-04 NOTE — Care Management Important Message (Signed)
Important Message  Patient Details  Name: [name] MRN: [id number] Date of Birth: [birth year]   Medicare Important Message Given:  Yes     [name] 10/04/2019, 3:54 PM
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
## 2019-10-04 NOTE — Progress Notes (Signed)
PROGRESS NOTE  [name] WUJ:[id number] DOB: May 16, 1940   PCP: Serita Grammes, MD  Patient is from: Home  DOA: 09/27/2019 LOS: 6  Brief Narrative / Interim history: 79 year old female with history of CKD, diabetes, diastolic CHF, pulmonary hypertension, paroxysmal A. fib not on AC, CAD, chronic pleural effusion admitted to ICU with acute on chronic respiratory failure with hypoxia and hypercarbia, acute on chronic congestive heart failure,  AKI on CKD requiring CRRT and UTI.  She was treated with BiPAP.  Received CRRT while in ICU and transition to intermittent HD, and transferred to [facility] hospitalist service on 10/04/2019, when patient started to deteriorate acutely.  Palliative medicine team met with patient and patient's family, and it was decided that it would be due to the patient's best interest to transition to full comfort care.  Subjective: Seen and examined earlier this morning, with two of patient's sons and daughter-in-law at bedside.  Patient resting comfortably.  Family tearful but reassured to to know that the decision is to the patient's best interest.  Objective: Vitals:   10/04/19 0820 10/04/19 1030 10/04/19 1127 10/04/19 1220  BP:  136/61    Pulse: 74 63 65   Resp: (!) 34 18 16   Temp:      TempSrc:      SpO2: 96% 100% 100% 98%  Weight:      Height:        Intake/Output Summary (Last 24 hours) at 10/04/2019 2135 Last data filed at 10/04/2019 0800 Gross per 24 hour  Intake --  Output 0 ml  Net 0 ml   Filed Weights   10/03/19 0500 10/03/19 2000 10/04/19 0146  Weight: 74.1 kg 72.4 kg 73.3 kg    Examination:  GENERAL: Chronically ill-appearing. RESP: No respiratory distress. CVS: Hemodynamically stable. PSYCH: No distress or agitation.  Procedures:  CRRT   Assessment & Plan: End-of-life care/DNR/DNI/comfort measures -Started on Dilaudid drip per PMT -Emotional support of family members -Anticipate in hospital death  Hypercarbic,  hypoxemic respiratory failure Respiratory Acidosis Pulmonary edema 2/2 acute on chronic diastolic CHF and volume overload in setting of acute on chronic renal failure Chronic loculated pleural effusion 2/2 RA  Acute metabolic encephalopathy  Acute on chronic kidney disease -Received CRRT while in ICU  Anemia of renal disease  Positive UA/UTI -Received ceftriaxone  Hx PAF/ bradycardia with pauses Not on anticoagulation Acute on chronic diastolic heart failure, pulmonary edema  Anemia of Chronic Disease  Essential Hypertension  DM II  Hypothyroidism  History of rheumatoid arthritis  Chronic pain  Abdominal Pain           Pressure Injury 09/11/19 Sacrum Stage 1 -  Intact skin with non-blanchable redness of a localized area usually over a bony prominence. pink (Active)  09/11/19 1750  Location: Sacrum  Location Orientation:   Staging: Stage 1 -  Intact skin with non-blanchable redness of a localized area usually over a bony prominence.  Wound Description (Comments): pink  Present on Admission: Yes    Family Communication: Updated patient's children (sons and daughter-in-law at bedside)      Consultants:  Palliative medicine Nephrology PCCM   Sch Meds:  Scheduled Meds: . chlorhexidine  15 mL Mouth Rinse BID  . furosemide  80 mg Intravenous Q12H  . lidocaine  1 patch Transdermal Q24H  . mouth rinse  15 mL Mouth Rinse q12n4p  . sodium chloride flush  10-40 mL Intracatheter Q12H   Continuous Infusions: . sodium chloride Stopped (09/30/19 1448)  . HYDROmorphone  0.6 mg/hr (10/04/19 1654)   PRN Meds:.sodium chloride, acetaminophen **OR** acetaminophen, alum & mag hydroxide-simeth, antiseptic oral rinse, glycopyrrolate **OR** glycopyrrolate **OR** glycopyrrolate, guaiFENesin, heparin, HYDROmorphone, LORazepam **OR** LORazepam **OR** LORazepam, ondansetron **OR** ondansetron (ZOFRAN) IV, oxyCODONE, polyvinyl alcohol, sodium chloride  flush  Antimicrobials: Anti-infectives (From admission, onward)   Start     Dose/Rate Route Frequency Ordered Stop   10/02/19 1230  cefTRIAXone (ROCEPHIN) 1 g in sodium chloride 0.9 % 100 mL IVPB  Status:  Discontinued     1 g 200 mL/hr over 30 Minutes Intravenous Every 24 hours 10/02/19 1134 10/04/19 1120       I have personally reviewed the following labs and images: CBC: Recent Labs  Lab 09/29/19 0857 09/29/19 0857 09/30/19 0655 09/30/19 0655 10/01/19 2038 10/01/19 2217 10/02/19 0257 10/03/19 0319 10/04/19 0256  WBC 7.4   < > 8.1  --  10.6*  --  12.9* 16.3* 17.2*  NEUTROABS 5.8  --   --   --   --   --   --   --   --   HGB 9.3*   < > 9.1*   < > 9.1* 9.9* 9.4* 10.1* 9.4*  HCT 31.0*   < > 30.2*   < > 30.0* 29.0* 31.5* 33.6* 31.0*  MCV 104.0*   < > 104.5*  --  104.9*  --  104.7* 103.7* 103.0*  PLT 223   < > 215  --  219  --  221 201 213   < > = values in this interval not displayed.   BMP &GFR Recent Labs  Lab 10/02/19 0257 10/02/19 1531 10/02/19 2140 10/03/19 0319 10/04/19 0256  NA 138 136 135 135 135  133*  K 4.9 5.3* 5.5* 4.4 4.8  4.6  CL 92* 98 98 100 96*  96*  CO2 34* '27 24 25 29  27  ' GLUCOSE 126* 156* 195* 134* 188*  188*  BUN 64* 39* 35* 27* 43*  39*  CREATININE 1.80* 1.25* 1.10* 0.84 1.40*  1.38*  CALCIUM 9.3 8.9 8.7* 9.0 9.3  9.1  MG 2.3 2.4 2.3 2.4 2.4  PHOS 4.6 2.8 2.7 2.2* 3.6  3.7   Estimated Creatinine Clearance: 29.8 mL/min (A) (by C-G formula based on SCr of 1.38 mg/dL (H)). Liver & Pancreas: Recent Labs  Lab 09/29/19 0857 09/30/19 0655 10/01/19 2038 10/02/19 0257 10/02/19 1531 10/03/19 0319 10/04/19 0256  AST 41  --   --  23  --   --  17  ALT 38  --   --  31  --   --  23  ALKPHOS 224*  --   --  195*  --   --  186*  BILITOT 0.8  --   --  0.7  --   --  0.5  PROT 6.0*  --   --  6.3*  --   --  6.0*  ALBUMIN 2.8*   < > 2.7* 2.8* 2.8* 2.7* 2.4*  2.4*   < > = values in this interval not displayed.   Recent Labs  Lab  10/02/19 0257 10/03/19 0319 10/04/19 0256  LIPASE  --  100* 73*  AMYLASE 554* 257* 79   No results for input(s): AMMONIA in the last 168 hours. Diabetic: No results for input(s): HGBA1C in the last 72 hours. Recent Labs  Lab 10/03/19 1544 10/03/19 1951 10/03/19 2356 10/04/19 0409 10/04/19 0803  GLUCAP 266* 232* 206* 172* 146*   Cardiac Enzymes: No results for input(s): CKTOTAL, CKMB,  CKMBINDEX, TROPONINI in the last 168 hours. No results for input(s): PROBNP in the last 8760 hours. Coagulation Profile: No results for input(s): INR, PROTIME in the last 168 hours. Thyroid Function Tests: No results for input(s): TSH, T4TOTAL, FREET4, T3FREE, THYROIDAB in the last 72 hours. Lipid Profile: No results for input(s): CHOL, HDL, LDLCALC, TRIG, CHOLHDL, LDLDIRECT in the last 72 hours. Anemia Panel: No results for input(s): VITAMINB12, FOLATE, FERRITIN, TIBC, IRON, RETICCTPCT in the last 72 hours. Urine analysis:    Component Value Date/Time   COLORURINE YELLOW 10/01/2019 1917   APPEARANCEUR TURBID (A) 10/01/2019 1917   LABSPEC 1.011 10/01/2019 1917   PHURINE 7.0 10/01/2019 1917   GLUCOSEU NEGATIVE 10/01/2019 1917   HGBUR SMALL (A) 10/01/2019 1917   BILIRUBINUR NEGATIVE 10/01/2019 [place] 10/01/2019 1917   PROTEINUR >=300 (A) 10/01/2019 1917   UROBILINOGEN 1.0 12/18/2008 1232   NITRITE NEGATIVE 10/01/2019 1917   LEUKOCYTESUR MODERATE (A) 10/01/2019 1917   Sepsis Labs: Invalid input(s): PROCALCITONIN, [place]  Microbiology: Recent Results (from the past 240 hour(s))  SARS Coronavirus 2 by RT PCR (hospital order, performed in [facility] hospital lab) Nasopharyngeal Nasopharyngeal Swab     Status: None   Collection Time: 09/29/2019 11:24 PM   Specimen: Nasopharyngeal Swab  Result Value Ref Range Status   SARS Coronavirus 2 NEGATIVE NEGATIVE Final    Comment: (NOTE) SARS-CoV-2 target nucleic acids are NOT DETECTED. The SARS-CoV-2 RNA is generally  detectable in upper and lower respiratory specimens during the acute phase of infection. The lowest concentration of SARS-CoV-2 viral copies this assay can detect is 250 copies / mL. A negative result does not preclude SARS-CoV-2 infection and should not be used as the sole basis for treatment or other patient management decisions.  A negative result may occur with improper specimen collection / handling, submission of specimen other than nasopharyngeal swab, presence of viral mutation(s) within the areas targeted by this assay, and inadequate number of viral copies (<250 copies / mL). A negative result must be combined with clinical observations, patient history, and epidemiological information. Fact Sheet for Patients:   StrictlyIdeas.no Fact Sheet for Healthcare Providers: BankingDealers.co.za This test is not yet approved or cleared  by the [place] FDA and has been authorized for detection and/or diagnosis of SARS-CoV-2 by FDA under an Emergency Use Authorization (EUA).  This EUA will remain in effect (meaning this test can be used) for the duration of the COVID-19 declaration under Section 564(b)(1) of the Act, 21 U.S.C. section 360bbb-3(b)(1), unless the authorization is terminated or revoked sooner. Performed at [facility], [place] [address], [place], [place] [address]   Culture, Urine     Status: Abnormal   Collection Time: 10/01/19  7:37 PM   Specimen: Urine, Random  Result Value Ref Range Status   Specimen Description URINE, RANDOM  Final   Special Requests   Final    NONE Performed at [facility], [place] [address] [place], [place] [address]    Culture >=100,000 COLONIES/mL PROTEUS MIRABILIS (A)  Final   Report Status 10/04/2019 FINAL  Final   Organism ID, Bacteria PROTEUS MIRABILIS (A)  Final      Susceptibility   Proteus mirabilis - MIC*    AMPICILLIN <=2 SENSITIVE Sensitive     CEFAZOLIN <=4 SENSITIVE  Sensitive     CEFTRIAXONE <=1 SENSITIVE Sensitive     CIPROFLOXACIN 1 SENSITIVE Sensitive     GENTAMICIN <=1 SENSITIVE Sensitive     IMIPENEM  2 SENSITIVE Sensitive     NITROFURANTOIN RESISTANT Resistant     TRIMETH/SULFA <=20 SENSITIVE Sensitive     AMPICILLIN/SULBACTAM <=2 SENSITIVE Sensitive     PIP/TAZO <=4 SENSITIVE Sensitive     * >=100,000 COLONIES/mL PROTEUS MIRABILIS  MRSA PCR Screening     Status: None   Collection Time: 10/02/19  9:02 AM   Specimen: Nasopharyngeal Wash  Result Value Ref Range Status   MRSA by PCR NEGATIVE NEGATIVE Final    Comment:        The GeneXpert MRSA Assay (FDA approved for NASAL specimens only), is one component of a comprehensive MRSA colonization surveillance program. It is not intended to diagnose MRSA infection nor to guide or monitor treatment for MRSA infections. Performed at [facility], [place] [address], [place], [place] [address]     Radiology Studies: DG Chest Port 1 View  Result Date: 10/04/2019 CLINICAL DATA:  Acute respiratory failure EXAM: PORTABLE CHEST 1 VIEW COMPARISON:  Yesterday FINDINGS: Right IJ line with tip at the SVC. Low volume chest with right complicated sub pulmonic effusion by CT. Interstitial prominence on both sides that is unchanged. Cardiomegaly. IMPRESSION: Stable low volume chest with interstitial opacities and loculated right pleural effusion. Electronically Signed   By: Monte Fantasia M.D.   On: 10/04/2019 08:46      Mckensie Scotti T. [place]  If 7PM-7AM, please contact night-coverage [email or web address] Password [facility] 10/04/2019, 9:35 PM

## 2019-10-05 DIAGNOSIS — J811 Chronic pulmonary edema: Secondary | ICD-10-CM | POA: Diagnosis present

## 2019-10-05 DIAGNOSIS — N390 Urinary tract infection, site not specified: Secondary | ICD-10-CM | POA: Diagnosis present

## 2019-10-05 DIAGNOSIS — G9341 Metabolic encephalopathy: Secondary | ICD-10-CM | POA: Diagnosis present

## 2019-10-05 DIAGNOSIS — J9602 Acute respiratory failure with hypercapnia: Secondary | ICD-10-CM | POA: Diagnosis present

## 2019-10-31 ENCOUNTER — Ambulatory Visit: Payer: Medicare Other | Admitting: Sports Medicine

## 2019-11-01 NOTE — Progress Notes (Signed)
Dilaudid 73ml waste verified with Marisa Sprinkles RN.

## 2019-11-01 NOTE — Death Summary Note (Signed)
DEATH SUMMARY   Patient Details  Name: [name] MRN: [id number] DOB: 18-Feb-1941  Admission/Discharge Information   Admit Date:  Oct 12, 2019  Date of Death: Date of Death: 19-Oct-2019  Time of Death: Time of Death: 0000  Length of Stay: 2022/07/22  Referring Physician: Serita Grammes, MD   Reason(s) for Hospitalization  Acute on chronic diastolic CHF  Diagnoses  Preliminary cause of death: End of life care Secondary Diagnoses (including complications and co-morbidities):  Principal Problem:   End of life care Active Problems:   HYPERTENSION, BENIGN   Rheumatoid arthritis ([place])   PAF (paroxysmal atrial fibrillation) ([place])   Pulmonary hypertension (HCC)   Rheumatoid arthritis involving multiple sites with positive rheumatoid factor ([place])   Type 2 diabetes mellitus without complications (HCC)   Acute on chronic diastolic CHF (congestive heart failure) ([place])   Acute kidney injury superimposed on chronic kidney disease ([place])   CKD (chronic kidney disease), stage III   Acute on chronic congestive heart failure ([place])   Acute respiratory failure (HCC)   Comfort measures only status   DNR (do not resuscitate)   Palliative care by specialist   Acute respiratory failure with hypoxia and hypercapnia ([place])   Acute metabolic encephalopathy   Urinary tract infection   Pulmonary edema   Brief Hospital Course (including significant findings, care, treatment, and services provided and events leading to death)  [name] is a 79 y.o. year old female with history of CKD, diabetes, diastolic CHF, pulmonary hypertension, paroxysmal A. fib not on AC, CAD, chronic pleural effusion admitted to ICU with acute on chronic respiratory failure with hypoxia and hypercarbia, acute on chronic congestive heart failure,  AKI on CKD requiring CRRT and UTI.  She was treated with BiPAP.  Received CRRT while in ICU and transitioned to intermittent HD, and transferred to [facility]  service on 10/04/2019, when patient started to deteriorate acutely.  Palliative medicine team met with patient and patient's family, and it was decided that it would be due to the patient's best interest to transition to full comfort care which was initiated right away.  Patient passed away peacefully on 10/19/2019 at 12 00 a.m.  Pertinent Labs and Studies  Significant Diagnostic Studies DG Chest 2 View  Result Date: 10-12-19 CLINICAL DATA:  Short of breath EXAM: CHEST - 2 VIEW COMPARISON:  09/10/2019 FINDINGS: Small bilateral pleural effusions. Enlarged cardiomediastinal silhouette with vascular congestion and moderate interstitial pulmonary edema. Basilar airspace disease. Aortic atherosclerosis. No pneumothorax. IMPRESSION: 1. Cardiomegaly with vascular congestion, interstitial pulmonary edema and small pleural effusions 2. Basilar consolidations may reflect superimposed atelectasis or pneumonia Electronically Signed   By: Donavan Foil M.D.   On: 10-12-19 18:57   DG Chest 2 View  Result Date: 09/10/2019 CLINICAL DATA:  Shortness of breath EXAM: CHEST - 2 VIEW COMPARISON:  08/21/2019 FINDINGS: Persistent elevation of the right hemidiaphragm presumably related to loculated pleural effusion seen on prior cross-sectional imaging. Persistent interstitial prominence. No pneumothorax. Stable cardiomediastinal contours. Advanced degenerative changes at the shoulders. IMPRESSION: No substantial change since 08/21/2019. Persistent elevation of the right hemidiaphragm probably related to loculated pleural effusion and interstitial prominence, which may reflect edema. Electronically Signed   By: Macy Mis M.D.   On: 09/10/2019 14:16   US RENAL  Result Date: 09/11/2019 CLINICAL DATA:  Acute on chronic renal failure EXAM: RENAL / URINARY TRACT ULTRASOUND COMPLETE COMPARISON:  08/21/2019 FINDINGS: Right Kidney: Renal measurements: 7.5 x 4.4 x 4.6 cm = volume: 79 mL. Stable renal  cortical thinning. Mild  increased renal cortical echotexture unchanged. Left Kidney: Renal measurements: 9.5 x 6.2 by 4.4 cm = volume: 133 mL. Stable renal cortical thinning. Mild increased renal cortical echotexture unchanged. Bladder: Bladder is not visualized due to bowel gas and incomplete distension. Other: None. IMPRESSION: 1. Stable bilateral renal cortical thinning. Increased renal cortical echotexture consistent with medical renal disease. Electronically Signed   By: Randa Ngo M.D.   On: 09/11/2019 00:26   DG Chest Port 1 View  Result Date: 10/04/2019 CLINICAL DATA:  Acute respiratory failure EXAM: PORTABLE CHEST 1 VIEW COMPARISON:  Yesterday FINDINGS: Right IJ line with tip at the SVC. Low volume chest with right complicated sub pulmonic effusion by CT. Interstitial prominence on both sides that is unchanged. Cardiomegaly. IMPRESSION: Stable low volume chest with interstitial opacities and loculated right pleural effusion. Electronically Signed   By: Monte Fantasia M.D.   On: 10/04/2019 08:46   DG Chest Port 1 View  Result Date: 10/03/2019 CLINICAL DATA:  Acute respiratory failure EXAM: PORTABLE CHEST 1 VIEW COMPARISON:  10/01/2018 FINDINGS: Low volume chest with diffuse interstitial and airspace opacity. Loculated right pleural effusion with elevated right diaphragm, reference recent chest CT. Borderline heart size that is stable. Right IJ line with tip at the SVC. Remarkably severe bilateral shoulder osteoarthritis. History of rheumatoid arthritis. IMPRESSION: Unchanged low volume chest with extensive pulmonary opacity and pleural effusions. Electronically Signed   By: Monte Fantasia M.D.   On: 10/03/2019 09:21   DG CHEST PORT 1 VIEW  Result Date: 10/01/2019 CLINICAL DATA:  Encounter for central line placement EXAM: PORTABLE CHEST 1 VIEW COMPARISON:  Chest radiograph 10/01/2019 at 9:35 a.m. FINDINGS: Interval placement of a right central venous catheter with tip projecting at the mid SVC. Stable  cardiomediastinal contours with enlarged heart size. Persistent sub pulmonic right pleural effusion and small left pleural effusion. Diffuse bilateral interstitial opacities and bibasilar consolidation. No pneumothorax. IMPRESSION: 1. Interval placement of a right central venous catheter with tip projecting at the mid SVC. 2. Otherwise, no significant interval change in bilateral interstitial opacities, bibasilar consolidation, and pleural effusions. Electronically Signed   By: Audie Pinto M.D.   On: 10/01/2019 20:30   DG CHEST PORT 1 VIEW  Result Date: 10/01/2019 CLINICAL DATA:  Dyspnea EXAM: PORTABLE CHEST 1 VIEW COMPARISON:  09/25/2019 chest radiograph. FINDINGS: Stable cardiomediastinal silhouette with mild cardiomegaly. No pneumothorax. Loculated moderate sub pulmonic right pleural effusion is unchanged. Small left pleural effusion. Diffuse hazy and linear parahilar lung opacities with bibasilar lung consolidation, air bronchograms and volume loss. IMPRESSION: 1. Stable loculated moderate subpulmonic right pleural effusion. Stable small left pleural effusion. 2. Stable cardiomegaly with diffuse hazy and linear parahilar lung opacities, favor pulmonary edema. 3. Bibasilar lung consolidation with air bronchograms, which could represent atelectasis, aspiration or pneumonia. Electronically Signed   By: Ilona Sorrel M.D.   On: 10/01/2019 09:47   DG Abd Portable 1V  Result Date: 10/01/2019 CLINICAL DATA:  Abdominal pain EXAM: PORTABLE ABDOMEN - 1 VIEW COMPARISON:  08/21/2019 CT abdomen/pelvis FINDINGS: No disproportionately dilated small bowel loops. No evidence of pneumatosis or pneumoperitoneum. Cholecystectomy clips are seen in the right upper quadrant of the abdomen. Abdominal aortic atherosclerosis. Moderate lumbar spondylosis. No radiopaque nephrolithiasis. Patchy bibasilar lung opacities. IMPRESSION: Nonobstructive bowel gas pattern. Electronically Signed   By: Ilona Sorrel M.D.   On: 10/01/2019  09:48   ECHOCARDIOGRAM COMPLETE  Result Date: 10/02/2019    ECHOCARDIOGRAM REPORT   Patient Name:   LEONARD HENDLER  Joost Date of Exam: 10/02/2019 Medical Rec #:  [id number]               Height:       59.0 in Accession #:    [id number]              Weight:       169.3 lb Date of Birth:  08/21/1940              BSA:          1.718 m Patient Age:    52 years                BP:           139/52 mmHg Patient Gender: F                       HR:           83 bpm. Exam Location:  Inpatient Procedure: 2D Echo Indications:    acute diastolic chf 979.48  History:        Patient has prior history of Echocardiogram examinations, most                 recent 09/29/2010. CHF, chronic kidney disease,                 Arrythmias:paroxysmal afib; Risk Factors:Diabetes.  Sonographer:    Johny Chess Referring Phys: [id number] [place]  1. Left ventricular ejection fraction, by estimation, is 60 to 65%. The left ventricle has normal function. The left ventricle has no regional wall motion abnormalities. There is mild concentric left ventricular hypertrophy. Left ventricular diastolic function could not be evaluated.  2. Right ventricular systolic function is normal. The right ventricular size is normal. There is moderately elevated pulmonary artery systolic pressure.  3. Left atrial size was moderately dilated.  4. The mitral valve is normal in structure. Mild mitral valve regurgitation.  5. The aortic valve is tricuspid. Aortic valve regurgitation is not visualized. No aortic stenosis is present. Comparison(s): Prior images unable to be directly viewed, comparison made by report only. FINDINGS  Left Ventricle: Left ventricular ejection fraction, by estimation, is 60 to 65%. The left ventricle has normal function. The left ventricle has no regional wall motion abnormalities. The left ventricular internal cavity size was normal in size. There is  mild concentric left ventricular hypertrophy. Left ventricular  diastolic function could not be evaluated. Right Ventricle: The right ventricular size is normal. No increase in right ventricular wall thickness. Right ventricular systolic function is normal. There is moderately elevated pulmonary artery systolic pressure. The tricuspid regurgitant velocity is 2.77 m/s, and with an assumed right atrial pressure of 15 mmHg, the estimated right ventricular systolic pressure is 48.2 mmHg. Left Atrium: Left atrial size was moderately dilated. Right Atrium: Right atrial size was normal in size. Pericardium: There is no evidence of pericardial effusion. Mitral Valve: The mitral valve is normal in structure. There is mild thickening of the mitral valve leaflet(s). There is mild calcification of the mitral valve leaflet(s). Moderate mitral annular calcification. Mild mitral valve regurgitation. Tricuspid Valve: The tricuspid valve is normal in structure. Tricuspid valve regurgitation is trivial. No evidence of tricuspid stenosis. Aortic Valve: The aortic valve is tricuspid. Aortic valve regurgitation is not visualized. No aortic stenosis is present. There is mild calcification of the aortic valve. Pulmonic Valve: The pulmonic valve was grossly normal. Pulmonic valve regurgitation is trivial. No evidence of pulmonic  stenosis. Aorta: The aortic root and ascending aorta are structurally normal, with no evidence of dilitation and the aortic root, ascending aorta and aortic arch are all structurally normal, with no evidence of dilitation or obstruction. IAS/Shunts: The atrial septum is grossly normal.  LEFT VENTRICLE PLAX 2D LVIDd:         4.55 cm LVIDs:         2.24 cm LV PW:         1.15 cm LV IVS:        1.23 cm LVOT diam:     2.00 cm LV SV:         89 LV SV Index:   52 LVOT Area:     3.14 cm  IVC IVC diam: 2.27 cm LEFT ATRIUM             Index       RIGHT ATRIUM           Index LA diam:        3.80 cm 2.21 cm/m  RA Area:     13.40 cm LA Vol (A2C):   59.4 ml 34.57 ml/m RA Volume:    31.10 ml  18.10 ml/m LA Vol (A4C):   66.1 ml 38.47 ml/m LA Biplane Vol: 65.4 ml 38.06 ml/m  AORTIC VALVE LVOT Vmax:   140.00 cm/s LVOT Vmean:  99.200 cm/s LVOT VTI:    0.284 m  AORTA Ao Root diam: 3.00 cm Ao Asc diam:  2.90 cm MITRAL VALVE                TRICUSPID VALVE MV Area (PHT): 3.21 cm     TR Peak grad:   30.7 mmHg MV Decel Time: 236 msec     TR Vmax:        277.00 cm/s MV E velocity: 146.00 cm/s MV A velocity: 127.00 cm/s  SHUNTS MV E/A ratio:  1.15         Systemic VTI:  0.28 m                             Systemic Diam: 2.00 cm Buford Dresser MD Electronically signed by Buford Dresser MD Signature Date/Time: 10/02/2019/9:16:07 PM    Final    VAS [place] LOWER EXTREMITY VENOUS (DVT)  Result Date: 09/11/2019  Lower Venous DVTStudy Indications: Edema.  Limitations: Body habitus and poor ultrasound/tissue interface. Comparison Study: 09/26/2010- negative lower extremity venous duplex Performing Technologist: Maudry Mayhew MHA, RDMS, RVT, RDCS  Examination Guidelines: A complete evaluation includes B-mode imaging, spectral Doppler, color Doppler, and power Doppler as needed of all accessible portions of each vessel. Bilateral testing is considered an integral part of a complete examination. Limited examinations for reoccurring indications may be performed as noted. The reflux portion of the exam is performed with the patient in reverse Trendelenburg.  +---------+---------------+---------+-----------+----------+--------------+ RIGHT    CompressibilityPhasicitySpontaneityPropertiesThrombus Aging +---------+---------------+---------+-----------+----------+--------------+ CFV      Full           Yes      Yes                                 +---------+---------------+---------+-----------+----------+--------------+ SFJ      Full                                                        +---------+---------------+---------+-----------+----------+--------------+  FV Prox  Full                                                         +---------+---------------+---------+-----------+----------+--------------+ FV Mid   Full                                                        +---------+---------------+---------+-----------+----------+--------------+ FV Distal                        Yes                                 +---------+---------------+---------+-----------+----------+--------------+ PFV      Full                                                        +---------+---------------+---------+-----------+----------+--------------+ POP      Full           Yes      Yes                                 +---------+---------------+---------+-----------+----------+--------------+ PTV      Full                                                        +---------+---------------+---------+-----------+----------+--------------+ PERO     Full                                                        +---------+---------------+---------+-----------+----------+--------------+   +-------+---------------+---------+-----------+----------+--------------+ LEFT   CompressibilityPhasicitySpontaneityPropertiesThrombus Aging +-------+---------------+---------+-----------+----------+--------------+ CFV    Full           Yes      Yes                                 +-------+---------------+---------+-----------+----------+--------------+ SFJ    Full                                                        +-------+---------------+---------+-----------+----------+--------------+ FV ProxFull                                                        +-------+---------------+---------+-----------+----------+--------------+  FV Mid Full                                                        +-------+---------------+---------+-----------+----------+--------------+ PFV    Full                                                         +-------+---------------+---------+-----------+----------+--------------+ POP    Full           Yes      Yes                                 +-------+---------------+---------+-----------+----------+--------------+ PTV    Full                                                        +-------+---------------+---------+-----------+----------+--------------+   Left Technical Findings: Not visualized segments include distal left femoral vein, peroneal veins.   Summary: RIGHT: - There is no evidence of deep vein thrombosis in the lower extremity. However, portions of this examination were limited- see technologist comments above.  - No cystic structure found in the popliteal fossa.  LEFT: - There is no evidence of deep vein thrombosis in the lower extremity. However, portions of this examination were limited- see technologist comments above.  - No cystic structure found in the popliteal fossa.  *See table(s) above for measurements and observations. Electronically signed by Harold Barban MD on 09/11/2019 at 9:46:34 PM.    Final     Microbiology Recent Results (from the past 240 hour(s))  SARS Coronavirus 2 by RT PCR (hospital order, performed in [facility] hospital lab) Nasopharyngeal Nasopharyngeal Swab     Status: None   Collection Time: 09/26/2019 11:24 PM   Specimen: Nasopharyngeal Swab  Result Value Ref Range Status   SARS Coronavirus 2 NEGATIVE NEGATIVE Final    Comment: (NOTE) SARS-CoV-2 target nucleic acids are NOT DETECTED. The SARS-CoV-2 RNA is generally detectable in upper and lower respiratory specimens during the acute phase of infection. The lowest concentration of SARS-CoV-2 viral copies this assay can detect is 250 copies / mL. A negative result does not preclude SARS-CoV-2 infection and should not be used as the sole basis for treatment or other patient management decisions.  A negative result may occur with improper specimen collection / handling, submission of specimen  other than nasopharyngeal swab, presence of viral mutation(s) within the areas targeted by this assay, and inadequate number of viral copies (<250 copies / mL). A negative result must be combined with clinical observations, patient history, and epidemiological information. Fact Sheet for Patients:   StrictlyIdeas.no Fact Sheet for Healthcare Providers: BankingDealers.co.za This test is not yet approved or cleared  by the [place] FDA and has been authorized for detection and/or diagnosis of SARS-CoV-2 by FDA under an Emergency Use Authorization (EUA).  This EUA will remain in effect (meaning this test can be used) for the duration of the COVID-19  declaration under Section 564(b)(1) of the Act, 21 U.S.C. section 360bbb-3(b)(1), unless the authorization is terminated or revoked sooner. Performed at [facility], [place] [address] [place], [place] [address]   Culture, Urine     Status: Abnormal   Collection Time: 10/01/19  7:37 PM   Specimen: Urine, Random  Result Value Ref Range Status   Specimen Description URINE, RANDOM  Final   Special Requests   Final    NONE Performed at [facility], [place] [address], [place], [place] [address]    Culture >=100,000 COLONIES/mL PROTEUS MIRABILIS (A)  Final   Report Status 10/04/2019 FINAL  Final   Organism ID, Bacteria PROTEUS MIRABILIS (A)  Final      Susceptibility   Proteus mirabilis - MIC*    AMPICILLIN <=2 SENSITIVE Sensitive     CEFAZOLIN <=4 SENSITIVE Sensitive     CEFTRIAXONE <=1 SENSITIVE Sensitive     CIPROFLOXACIN 1 SENSITIVE Sensitive     GENTAMICIN <=1 SENSITIVE Sensitive     IMIPENEM 2 SENSITIVE Sensitive     NITROFURANTOIN RESISTANT Resistant     TRIMETH/SULFA <=20 SENSITIVE Sensitive     AMPICILLIN/SULBACTAM <=2 SENSITIVE Sensitive     PIP/TAZO <=4 SENSITIVE Sensitive     * >=100,000 COLONIES/mL PROTEUS MIRABILIS  MRSA PCR Screening     Status: None   Collection  Time: 10/02/19  9:02 AM   Specimen: Nasopharyngeal Wash  Result Value Ref Range Status   MRSA by PCR NEGATIVE NEGATIVE Final    Comment:        The GeneXpert MRSA Assay (FDA approved for NASAL specimens only), is one component of a comprehensive MRSA colonization surveillance program. It is not intended to diagnose MRSA infection nor to guide or monitor treatment for MRSA infections. Performed at [facility], [place] [address] [place], [place] [address]     Lab Basic Metabolic Panel: Recent Labs  Lab 10/02/19 0257 10/02/19 1531 10/02/19 2140 10/03/19 0319 10/04/19 0256  NA 138 136 135 135 135  133*  K 4.9 5.3* 5.5* 4.4 4.8  4.6  CL 92* 98 98 100 96*  96*  CO2 34* '27 24 25 29  27  ' GLUCOSE 126* 156* 195* 134* 188*  188*  BUN 64* 39* 35* 27* 43*  39*  CREATININE 1.80* 1.25* 1.10* 0.84 1.40*  1.38*  CALCIUM 9.3 8.9 8.7* 9.0 9.3  9.1  MG 2.3 2.4 2.3 2.4 2.4  PHOS 4.6 2.8 2.7 2.2* 3.6  3.7   Liver Function Tests: Recent Labs  Lab 09/29/19 0857 09/30/19 0655 10/01/19 2038 10/02/19 0257 10/02/19 1531 10/03/19 0319 10/04/19 0256  AST 41  --   --  23  --   --  17  ALT 38  --   --  31  --   --  23  ALKPHOS 224*  --   --  195*  --   --  186*  BILITOT 0.8  --   --  0.7  --   --  0.5  PROT 6.0*  --   --  6.3*  --   --  6.0*  ALBUMIN 2.8*   < > 2.7* 2.8* 2.8* 2.7* 2.4*  2.4*   < > = values in this interval not displayed.   Recent Labs  Lab 10/02/19 0257 10/03/19 0319 10/04/19 0256  LIPASE  --  100* 73*  AMYLASE 554* 257* 79   No results for input(s): AMMONIA in the last 168 hours. CBC:  Recent Labs  Lab 09/29/19 0857 09/29/19 0857 09/30/19 0655 09/30/19 0655 10/01/19 2038 10/01/19 2217 10/02/19 0257 10/03/19 0319 10/04/19 0256  WBC 7.4   < > 8.1  --  10.6*  --  12.9* 16.3* 17.2*  NEUTROABS 5.8  --   --   --   --   --   --   --   --   HGB 9.3*   < > 9.1*   < > 9.1* 9.9* 9.4* 10.1* 9.4*  HCT 31.0*   < > 30.2*   < > 30.0* 29.0* 31.5* 33.6*  31.0*  MCV 104.0*   < > 104.5*  --  104.9*  --  104.7* 103.7* 103.0*  PLT 223   < > 215  --  219  --  221 201 213   < > = values in this interval not displayed.   Cardiac Enzymes: No results for input(s): CKTOTAL, CKMB, CKMBINDEX, TROPONINI in the last 168 hours. Sepsis Labs: Recent Labs  Lab 10/01/19 2038 10/02/19 0257 10/03/19 0319 10/04/19 0256  WBC 10.6* 12.9* 16.3* 17.2*    Procedures/Operations  CRRT   Mercy Riding 10/11/19, 10:36 AM

## 2019-11-01 DEATH — deceased

## 2020-06-14 IMAGING — DX DG CHEST 2V
3 series · 3 of 3 positions shown · non-contrast
Comparison: 08/21/2019

CLINICAL DATA: Shortness of breath

EXAM:
CHEST - 2 VIEW

[chest lat (1 of 2)]
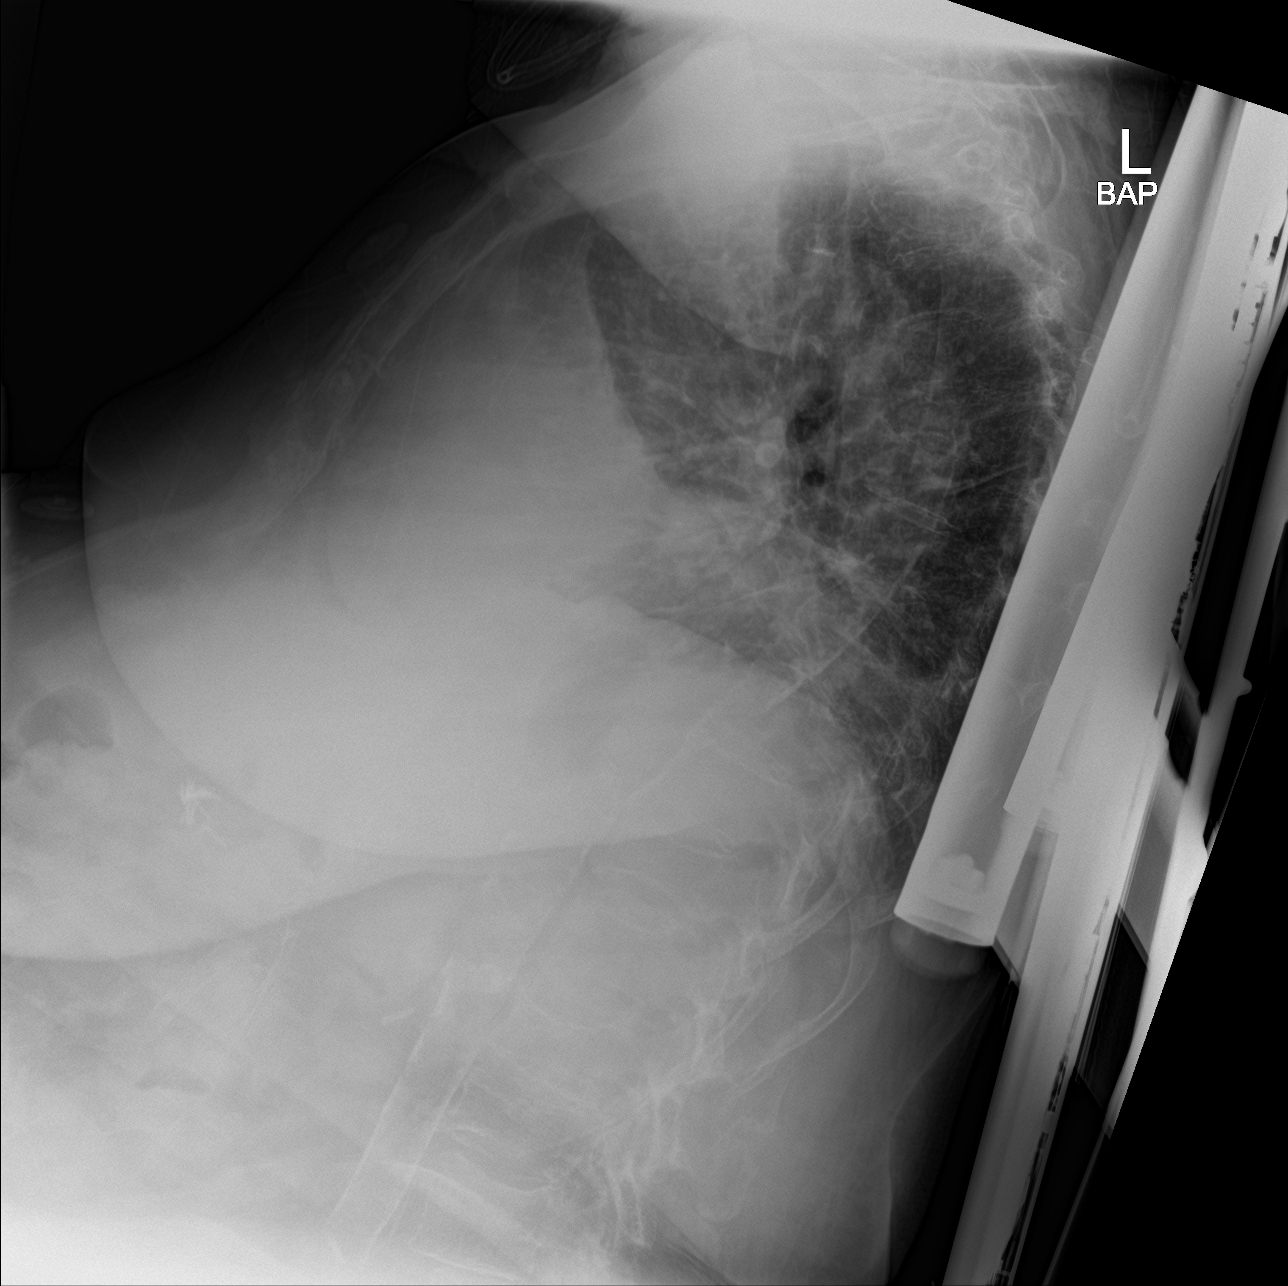

[chest ap]
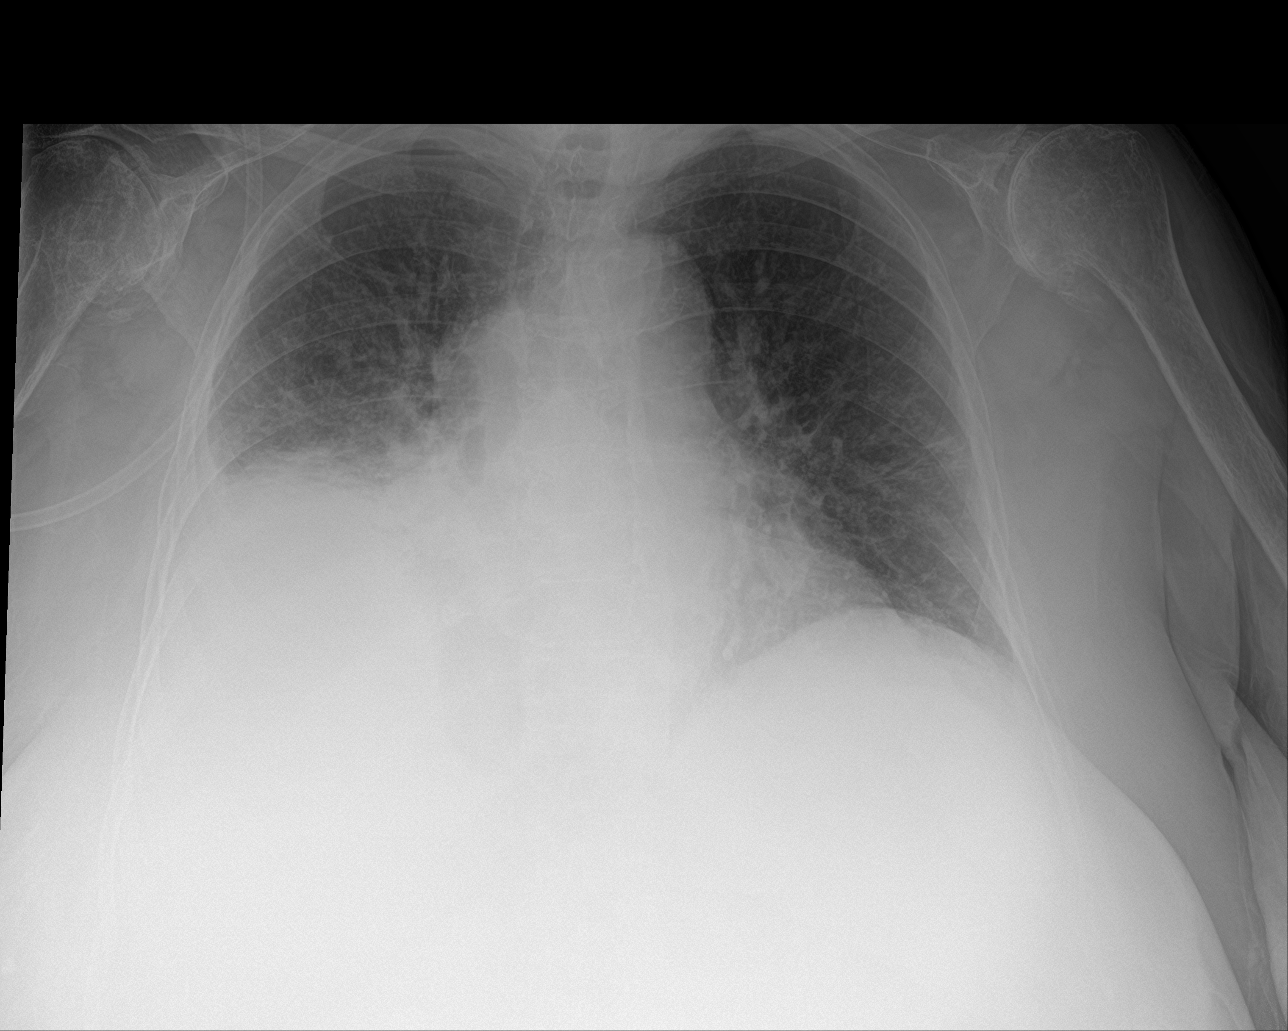

[chest lat (2 of 2)]
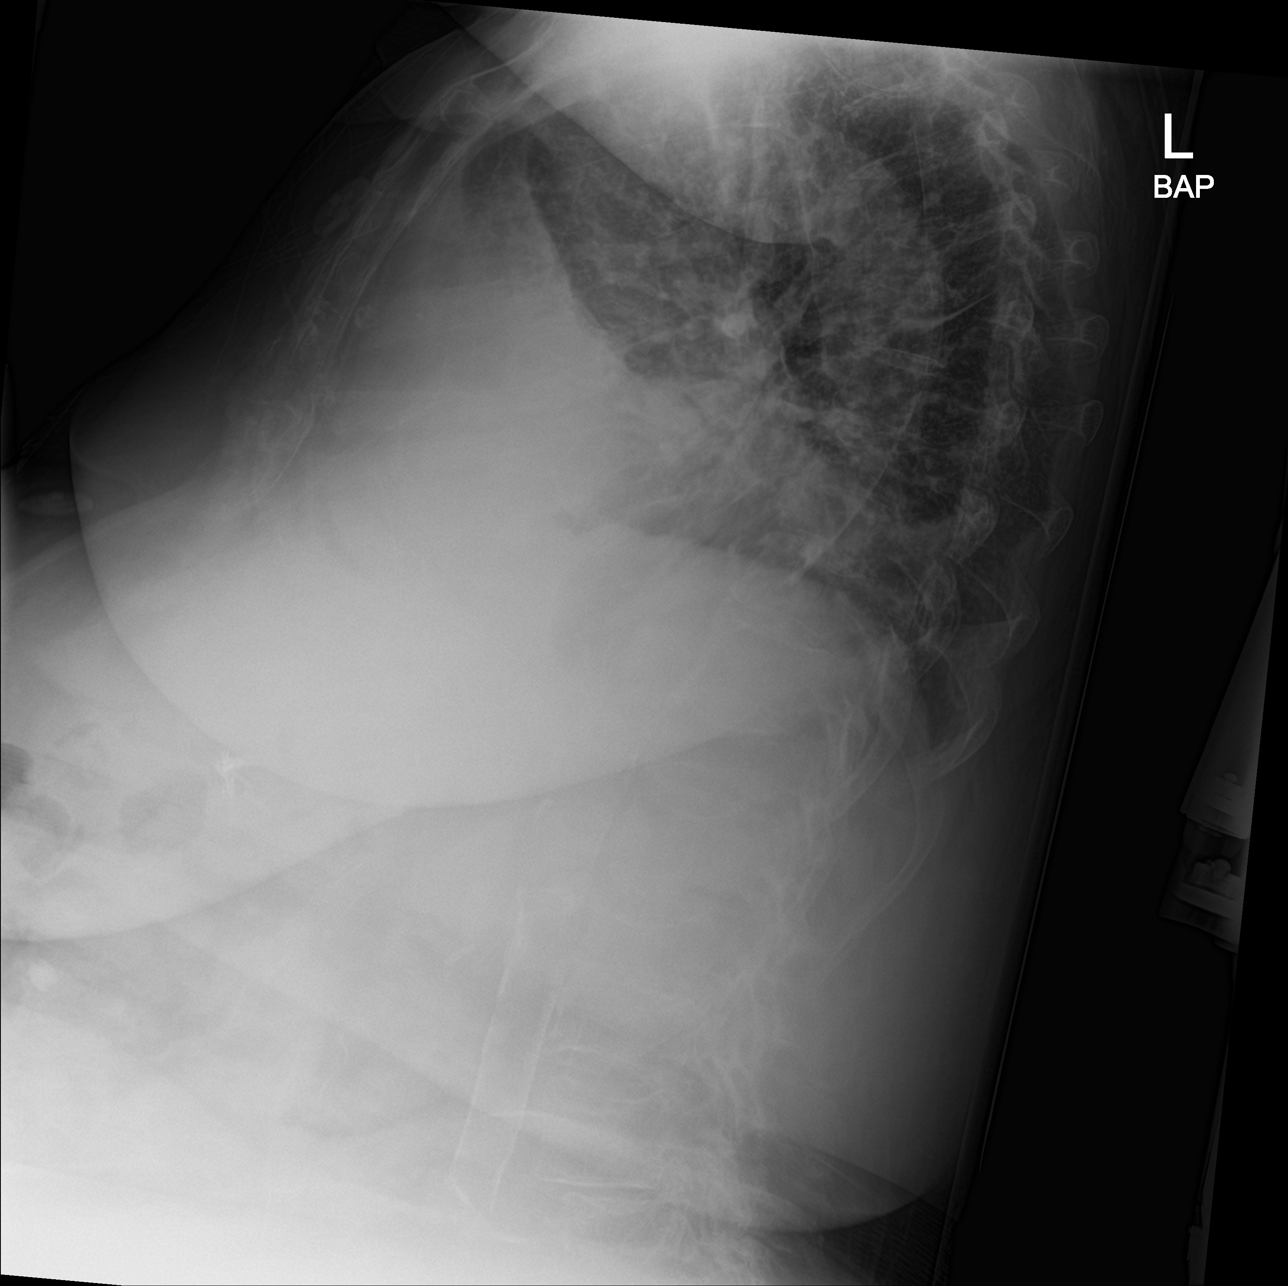

[3 of 3 positions shown; findings below may reference images not displayed]

FINDINGS: Persistent elevation of the right hemidiaphragm presumably related
to loculated pleural effusion seen on prior cross-sectional imaging.
Persistent interstitial prominence. No pneumothorax. Stable
cardiomediastinal contours. Advanced degenerative changes at the
shoulders.
IMPRESSION: No substantial change since 08/21/2019. Persistent elevation of the
right hemidiaphragm probably related to loculated pleural effusion
and interstitial prominence, which may reflect edema.

## 2020-07-05 IMAGING — DX DG CHEST 1V PORT
1 series · 1 of 1 positions shown · non-contrast
Comparison: Chest radiograph 10/01/2019 at [DATE] a.m.

CLINICAL DATA: Encounter for central line placement

EXAM:
PORTABLE CHEST 1 VIEW

[chest]
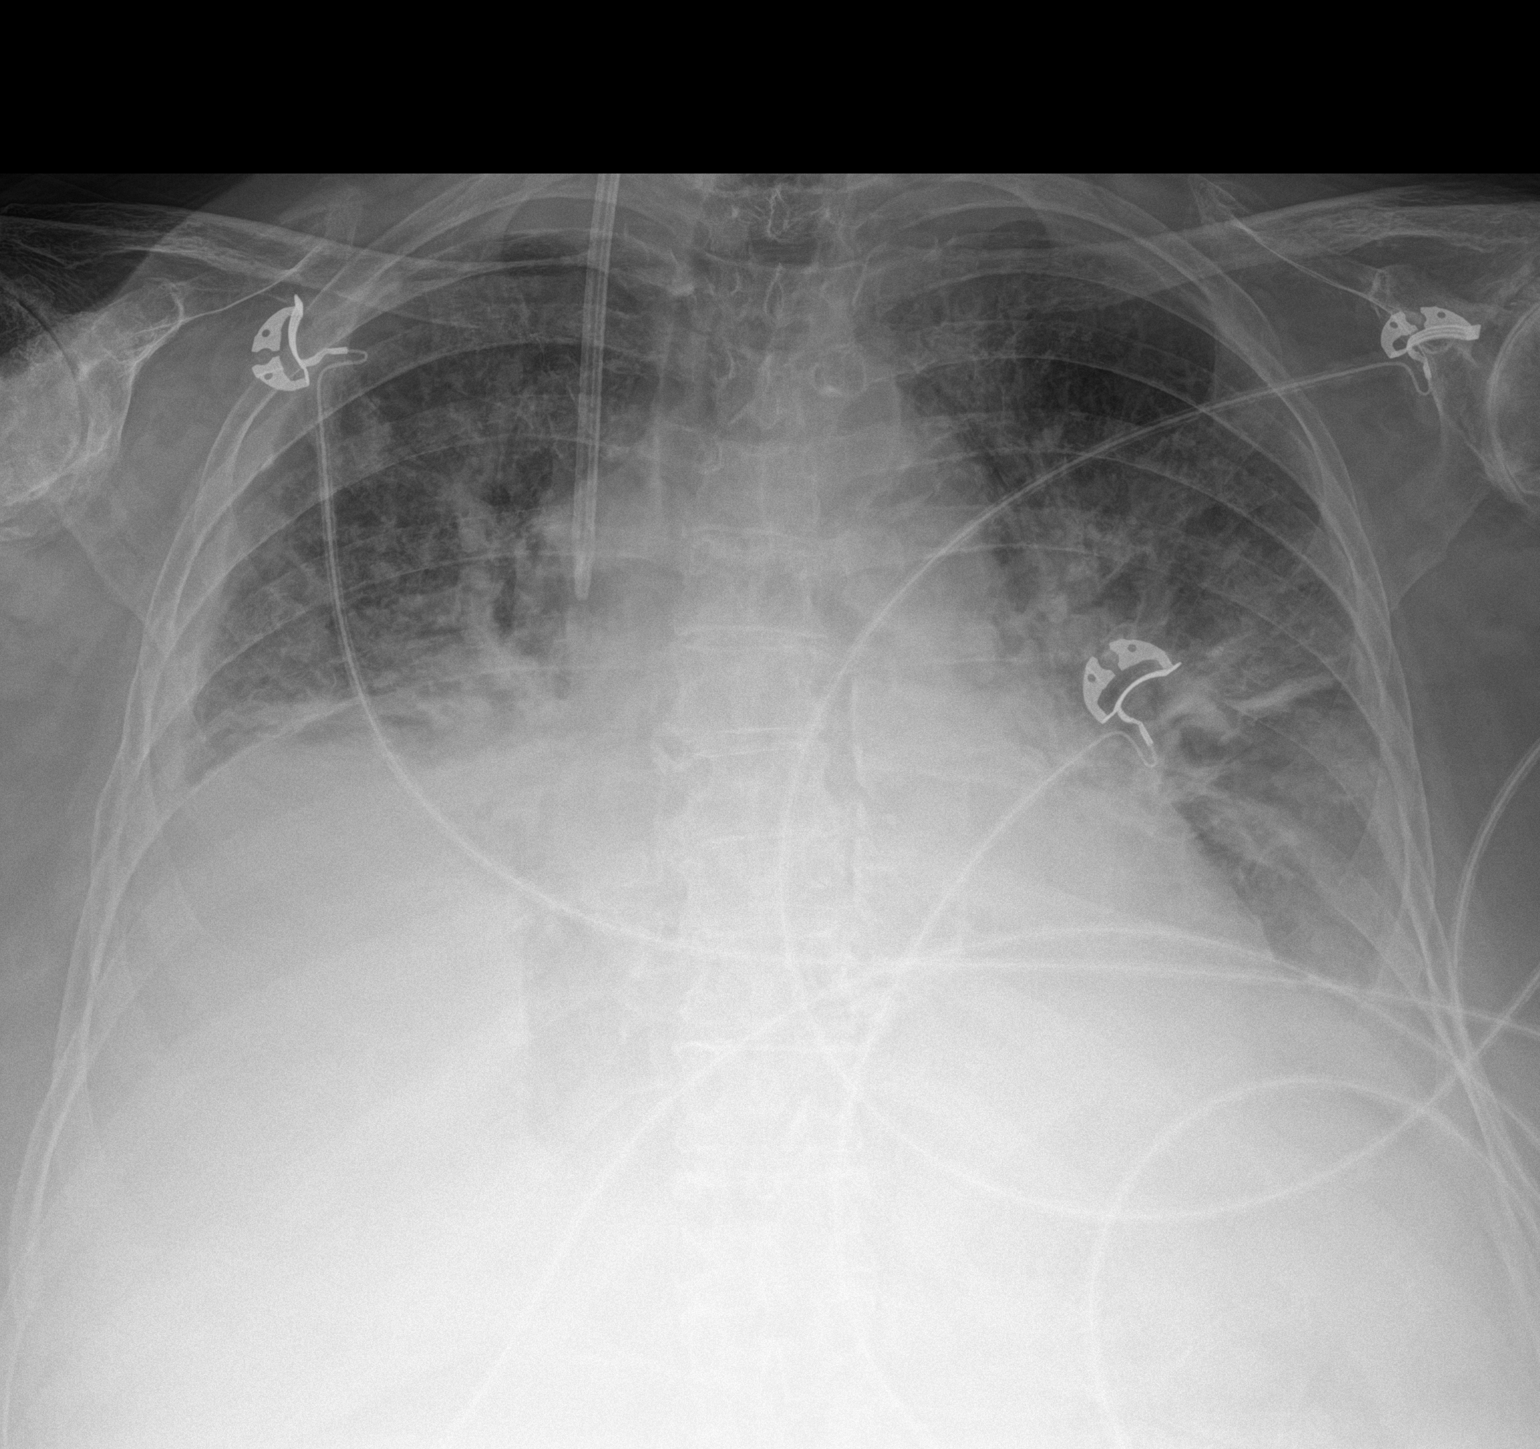

[1 of 1 positions shown; findings below may reference images not displayed]

FINDINGS: Interval placement of a right central venous catheter with tip
projecting at the mid SVC.

Stable cardiomediastinal contours with enlarged heart size.
Persistent sub pulmonic right pleural effusion and small left
pleural effusion. Diffuse bilateral interstitial opacities and
bibasilar consolidation. No pneumothorax.
IMPRESSION: 1. Interval placement of a right central venous catheter with tip
projecting at the mid SVC.
2. Otherwise, no significant interval change in bilateral
interstitial opacities, bibasilar consolidation, and pleural
effusions.

## 2020-07-05 IMAGING — DX DG CHEST 1V PORT
1 series · 1 of 1 positions shown · non-contrast
Comparison: 09/28/2019 chest radiograph.

CLINICAL DATA: Dyspnea

EXAM:
PORTABLE CHEST 1 VIEW

[chest ap]
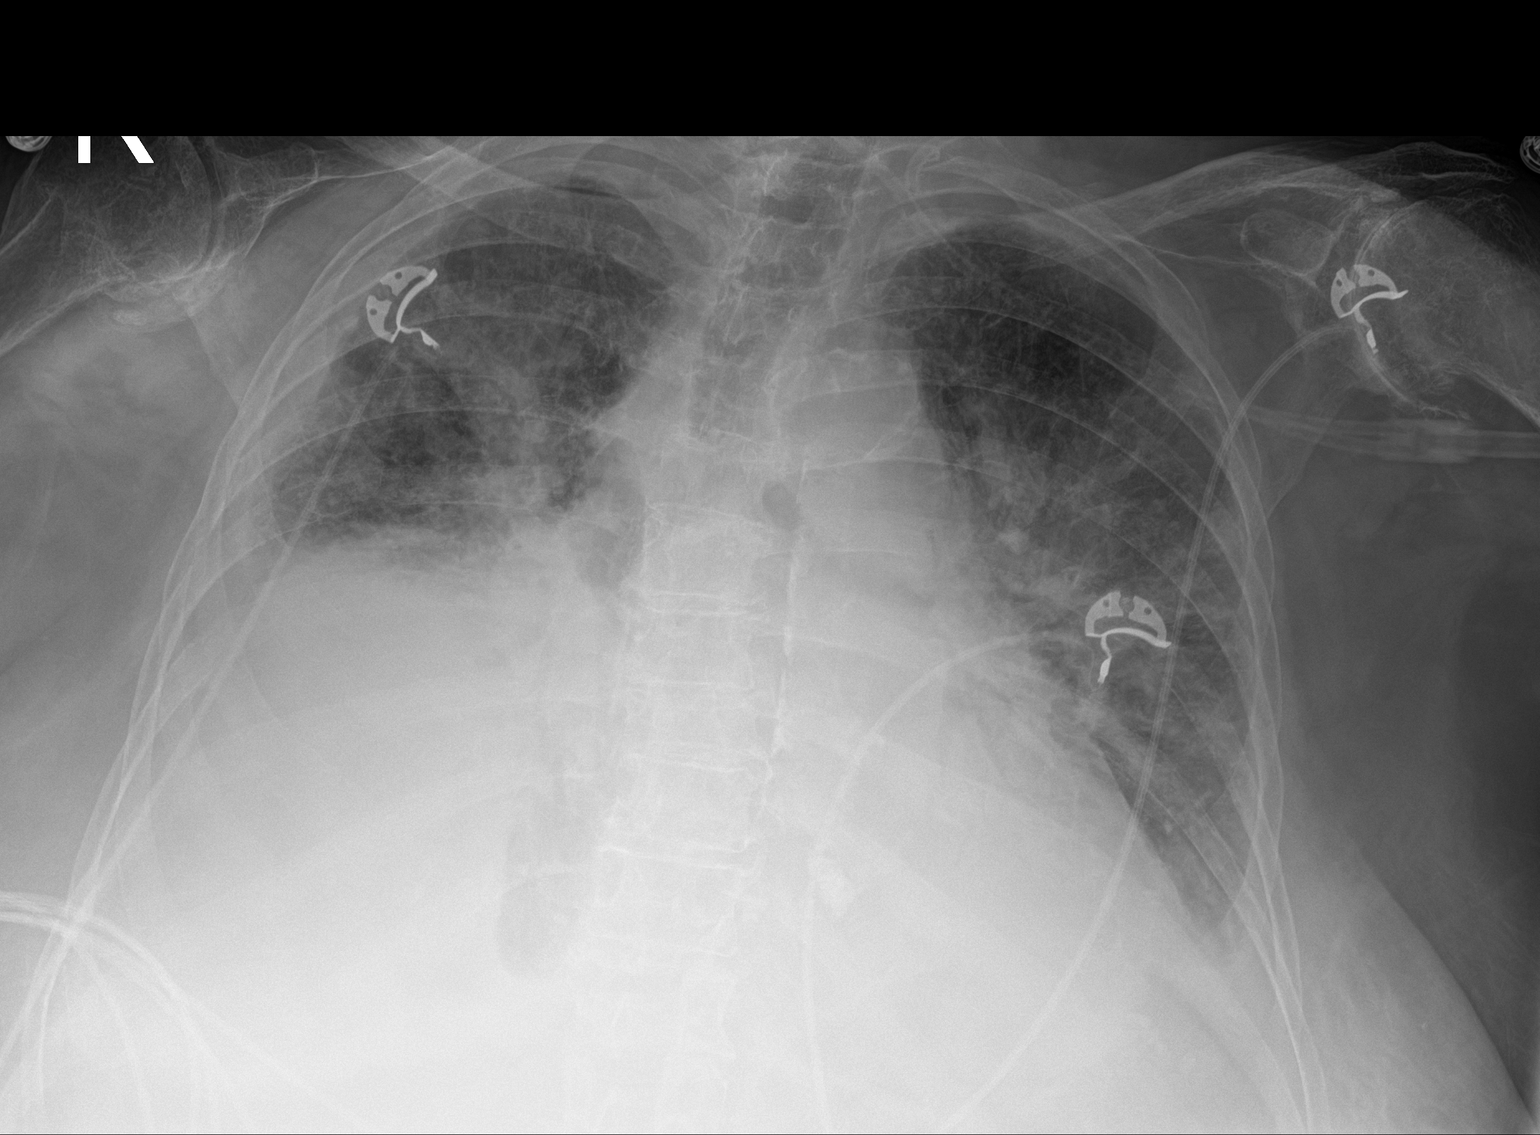

[1 of 1 positions shown; findings below may reference images not displayed]

FINDINGS: Stable cardiomediastinal silhouette with mild cardiomegaly. No
pneumothorax. Loculated moderate sub pulmonic right pleural effusion
is unchanged. Small left pleural effusion. Diffuse hazy and linear
parahilar lung opacities with bibasilar lung consolidation, air
bronchograms and volume loss.
IMPRESSION: 1. Stable loculated moderate subpulmonic right pleural effusion.
Stable small left pleural effusion.
2. Stable cardiomegaly with diffuse hazy and linear parahilar lung
opacities, favor pulmonary edema.
3. Bibasilar lung consolidation with air bronchograms, which could
represent atelectasis, aspiration or pneumonia.

## 2020-07-07 IMAGING — DX DG CHEST 1V PORT
1 series · 1 of 1 positions shown · non-contrast
Comparison: 10/01/2018

CLINICAL DATA: Acute respiratory failure

EXAM:
PORTABLE CHEST 1 VIEW

[chest ap]
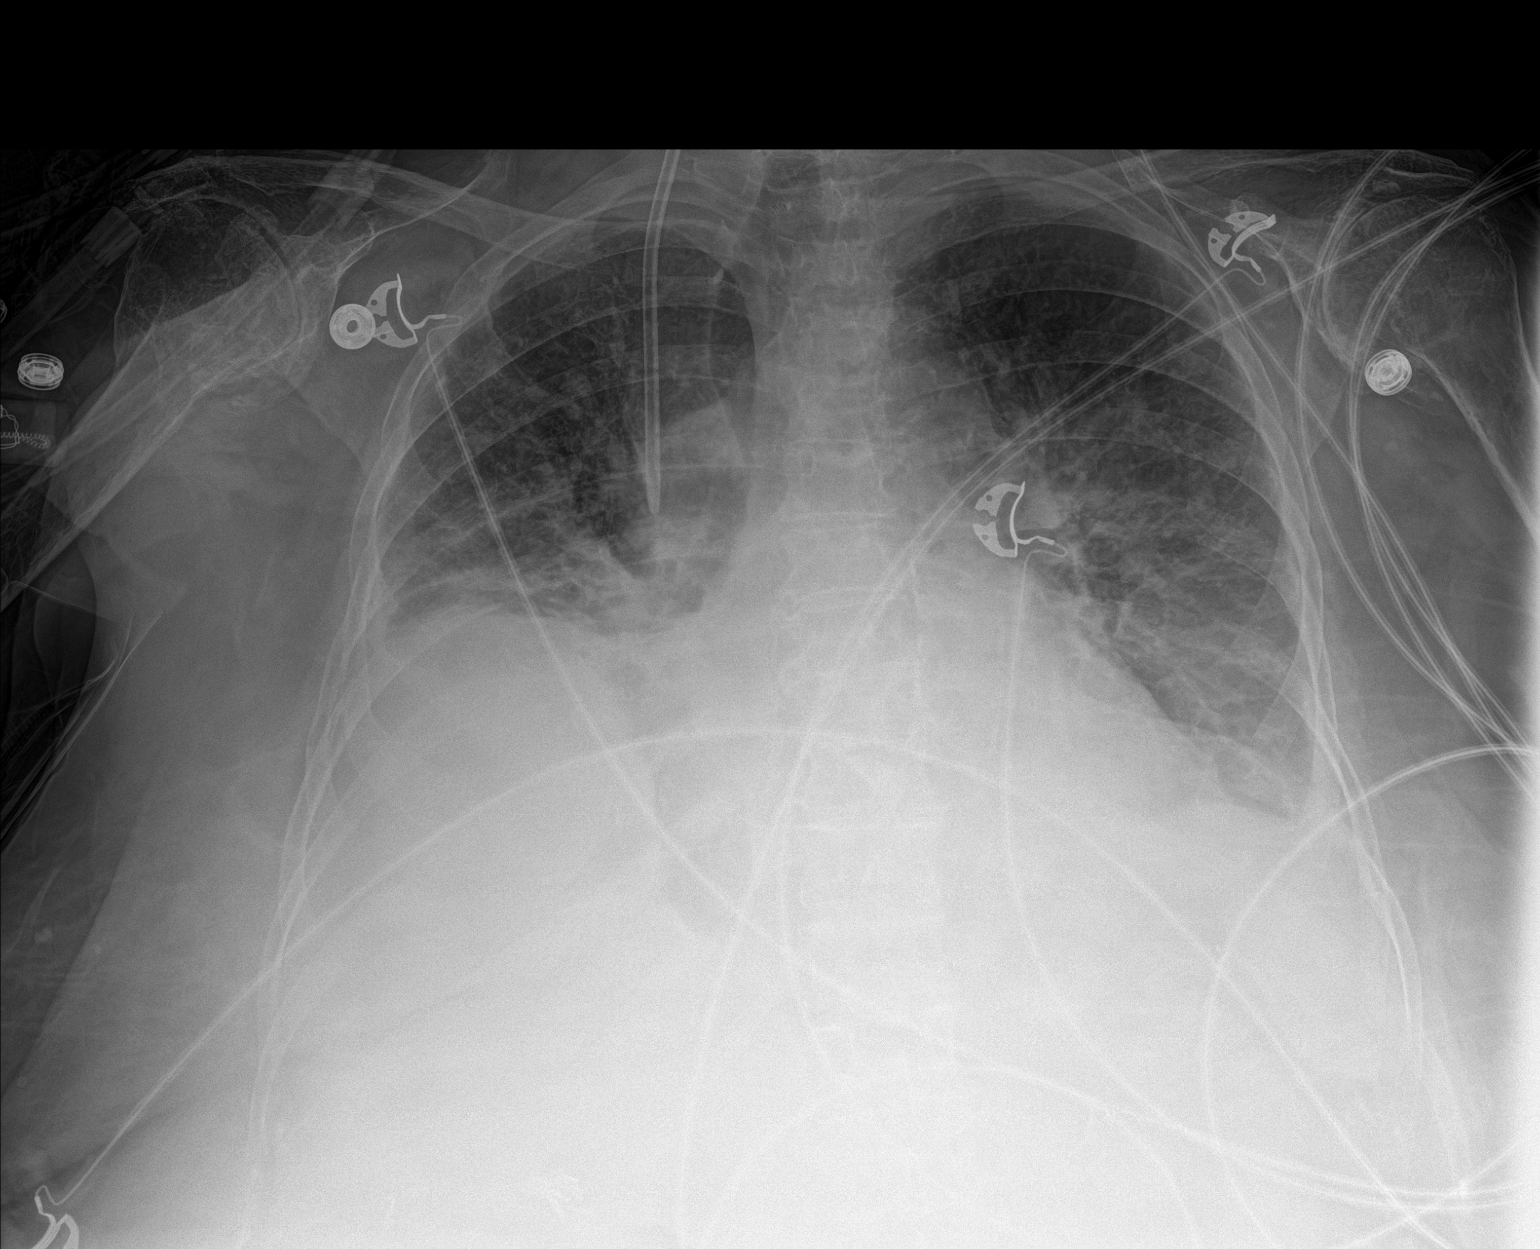

[1 of 1 positions shown; findings below may reference images not displayed]

FINDINGS: Low volume chest with diffuse interstitial and airspace opacity.
Loculated right pleural effusion with elevated right diaphragm,
reference recent chest CT.

Borderline heart size that is stable. Right IJ line with tip at the
SVC.

Remarkably severe bilateral shoulder osteoarthritis. History of
rheumatoid arthritis.
IMPRESSION: Unchanged low volume chest with extensive pulmonary opacity and
pleural effusions.

## 2020-07-08 IMAGING — DX DG CHEST 1V PORT
1 series · 1 of 1 positions shown · non-contrast
Comparison: Yesterday

CLINICAL DATA: Acute respiratory failure

EXAM:
PORTABLE CHEST 1 VIEW

[chest ap]
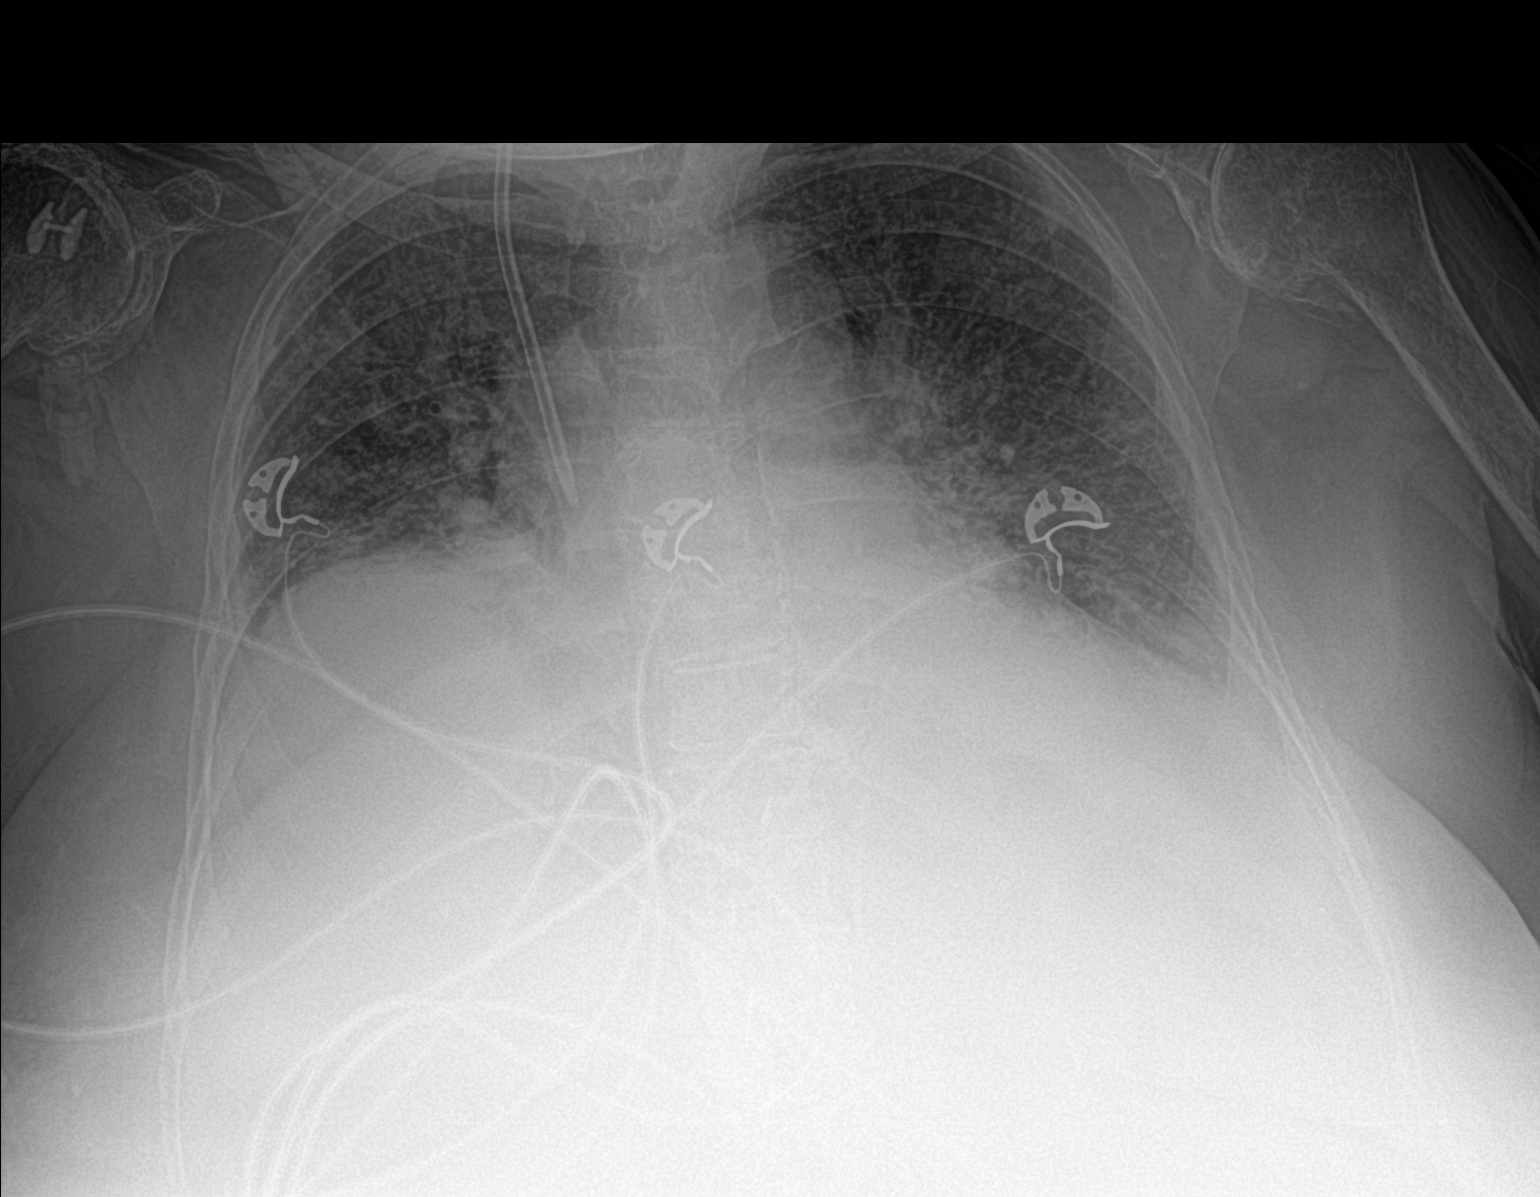

[1 of 1 positions shown; findings below may reference images not displayed]

FINDINGS: Right IJ line with tip at the SVC.

Low volume chest with right complicated sub pulmonic effusion by CT.
Interstitial prominence on both sides that is unchanged.
Cardiomegaly.
IMPRESSION: Stable low volume chest with interstitial opacities and loculated
right pleural effusion.
